# Patient Record
Sex: Female | Born: 1950 | Race: White | Hispanic: No | Marital: Married | Smoking: Never smoker
Health system: Southern US, Community
[De-identification: ages and names within clinical notes are randomized; demographics above are authoritative.]

## PROBLEM LIST (undated history)

## (undated) ENCOUNTER — Emergency Department (HOSPITAL_COMMUNITY): Admission: EM | Discharge status: Absent without leave | Payer: Medicare PPO | Source: Home / Self Care

## (undated) DIAGNOSIS — C50919 Malignant neoplasm of unspecified site of unspecified female breast: Secondary | ICD-10-CM

## (undated) DIAGNOSIS — I10 Essential (primary) hypertension: Secondary | ICD-10-CM

## (undated) DIAGNOSIS — T7840XA Allergy, unspecified, initial encounter: Secondary | ICD-10-CM

## (undated) DIAGNOSIS — R918 Other nonspecific abnormal finding of lung field: Secondary | ICD-10-CM

## (undated) DIAGNOSIS — M810 Age-related osteoporosis without current pathological fracture: Secondary | ICD-10-CM

## (undated) DIAGNOSIS — K649 Unspecified hemorrhoids: Secondary | ICD-10-CM

## (undated) DIAGNOSIS — E215 Disorder of parathyroid gland, unspecified: Secondary | ICD-10-CM

## (undated) DIAGNOSIS — R32 Unspecified urinary incontinence: Secondary | ICD-10-CM

## (undated) DIAGNOSIS — G459 Transient cerebral ischemic attack, unspecified: Secondary | ICD-10-CM

## (undated) DIAGNOSIS — R413 Other amnesia: Secondary | ICD-10-CM

## (undated) DIAGNOSIS — R269 Unspecified abnormalities of gait and mobility: Secondary | ICD-10-CM

## (undated) DIAGNOSIS — E785 Hyperlipidemia, unspecified: Secondary | ICD-10-CM

## (undated) DIAGNOSIS — H269 Unspecified cataract: Secondary | ICD-10-CM

## (undated) DIAGNOSIS — I519 Heart disease, unspecified: Secondary | ICD-10-CM

## (undated) DIAGNOSIS — E213 Hyperparathyroidism, unspecified: Secondary | ICD-10-CM

## (undated) DIAGNOSIS — K449 Diaphragmatic hernia without obstruction or gangrene: Secondary | ICD-10-CM

## (undated) DIAGNOSIS — F419 Anxiety disorder, unspecified: Secondary | ICD-10-CM

## (undated) HISTORY — PX: TUBAL LIGATION: SHX77

## (undated) HISTORY — DX: Unspecified cataract: H26.9

## (undated) HISTORY — PX: CHOLECYSTECTOMY: SHX55

## (undated) HISTORY — DX: Hyperlipidemia, unspecified: E78.5

## (undated) HISTORY — DX: Anxiety disorder, unspecified: F41.9

## (undated) HISTORY — DX: Disorder of parathyroid gland, unspecified: E21.5

## (undated) HISTORY — DX: Malignant neoplasm of unspecified site of unspecified female breast: C50.919

## (undated) HISTORY — PX: MASTECTOMY: SHX3

## (undated) HISTORY — DX: Allergy, unspecified, initial encounter: T78.40XA

## (undated) HISTORY — PX: PARATHYROIDECTOMY: SHX19

## (undated) HISTORY — DX: Transient cerebral ischemic attack, unspecified: G45.9

## (undated) HISTORY — DX: Unspecified abnormalities of gait and mobility: R26.9

## (undated) HISTORY — DX: Unspecified hemorrhoids: K64.9

## (undated) HISTORY — DX: Unspecified urinary incontinence: R32

## (undated) HISTORY — DX: Other amnesia: R41.3

## (undated) HISTORY — DX: Essential (primary) hypertension: I10

## (undated) HISTORY — PX: TOTAL ELBOW REPLACEMENT: SUR1214

## (undated) HISTORY — PX: BLADDER SUSPENSION: SHX72

## (undated) HISTORY — DX: Heart disease, unspecified: I51.9

## (undated) HISTORY — DX: Age-related osteoporosis without current pathological fracture: M81.0

## (undated) HISTORY — DX: Other nonspecific abnormal finding of lung field: R91.8

## (undated) HISTORY — DX: Hyperparathyroidism, unspecified: E21.3

## (undated) HISTORY — DX: Diaphragmatic hernia without obstruction or gangrene: K44.9

---

## 1998-10-07 ENCOUNTER — Other Ambulatory Visit: Admission: RE | Admit: 1998-10-07 | Discharge: 1998-10-07 | Payer: Self-pay | Admitting: *Deleted

## 1999-04-12 ENCOUNTER — Other Ambulatory Visit: Admission: RE | Admit: 1999-04-12 | Discharge: 1999-04-12 | Payer: Self-pay | Admitting: Surgery

## 1999-04-16 ENCOUNTER — Ambulatory Visit (HOSPITAL_BASED_OUTPATIENT_CLINIC_OR_DEPARTMENT_OTHER): Admission: RE | Admit: 1999-04-16 | Discharge: 1999-04-16 | Payer: Self-pay | Admitting: Surgery

## 1999-04-26 ENCOUNTER — Encounter: Payer: Self-pay | Admitting: Surgery

## 1999-04-27 ENCOUNTER — Ambulatory Visit (HOSPITAL_COMMUNITY): Admission: RE | Admit: 1999-04-27 | Discharge: 1999-04-29 | Payer: Self-pay | Admitting: Surgery

## 1999-04-27 ENCOUNTER — Encounter: Payer: Self-pay | Admitting: Surgery

## 2005-01-04 ENCOUNTER — Ambulatory Visit: Payer: Self-pay | Admitting: Oncology

## 2005-01-25 ENCOUNTER — Encounter: Admission: RE | Admit: 2005-01-25 | Discharge: 2005-01-25 | Payer: Self-pay | Admitting: Oncology

## 2005-01-28 ENCOUNTER — Other Ambulatory Visit: Admission: RE | Admit: 2005-01-28 | Discharge: 2005-01-28 | Payer: Self-pay | Admitting: Obstetrics and Gynecology

## 2005-02-08 ENCOUNTER — Encounter: Admission: RE | Admit: 2005-02-08 | Discharge: 2005-02-08 | Payer: Self-pay | Admitting: Oncology

## 2005-06-24 ENCOUNTER — Ambulatory Visit: Payer: Self-pay | Admitting: Oncology

## 2006-05-11 ENCOUNTER — Encounter: Admission: RE | Admit: 2006-05-11 | Discharge: 2006-05-11 | Payer: Self-pay | Admitting: Family Medicine

## 2006-07-28 ENCOUNTER — Ambulatory Visit (HOSPITAL_COMMUNITY): Admission: RE | Admit: 2006-07-28 | Discharge: 2006-07-28 | Payer: Self-pay | Admitting: Obstetrics and Gynecology

## 2006-08-05 ENCOUNTER — Emergency Department (HOSPITAL_COMMUNITY): Admission: EM | Admit: 2006-08-05 | Discharge: 2006-08-05 | Payer: Self-pay | Admitting: Emergency Medicine

## 2006-08-11 ENCOUNTER — Ambulatory Visit (HOSPITAL_BASED_OUTPATIENT_CLINIC_OR_DEPARTMENT_OTHER): Admission: RE | Admit: 2006-08-11 | Discharge: 2006-08-12 | Payer: Self-pay | Admitting: Orthopedic Surgery

## 2007-05-23 ENCOUNTER — Encounter: Admission: RE | Admit: 2007-05-23 | Discharge: 2007-05-23 | Payer: Self-pay | Admitting: Family Medicine

## 2007-12-20 ENCOUNTER — Emergency Department (HOSPITAL_COMMUNITY): Admission: EM | Admit: 2007-12-20 | Discharge: 2007-12-20 | Payer: Self-pay | Admitting: Emergency Medicine

## 2008-06-03 ENCOUNTER — Encounter: Admission: RE | Admit: 2008-06-03 | Discharge: 2008-06-03 | Payer: Self-pay | Admitting: Family Medicine

## 2008-12-26 ENCOUNTER — Ambulatory Visit (HOSPITAL_COMMUNITY): Admission: RE | Admit: 2008-12-26 | Discharge: 2008-12-26 | Payer: Self-pay | Admitting: Endocrinology

## 2009-01-10 ENCOUNTER — Encounter: Admission: RE | Admit: 2009-01-10 | Discharge: 2009-01-10 | Payer: Self-pay | Admitting: Surgery

## 2009-02-24 ENCOUNTER — Encounter (INDEPENDENT_AMBULATORY_CARE_PROVIDER_SITE_OTHER): Payer: Self-pay | Admitting: Surgery

## 2009-02-24 ENCOUNTER — Ambulatory Visit (HOSPITAL_COMMUNITY): Admission: RE | Admit: 2009-02-24 | Discharge: 2009-02-25 | Payer: Self-pay | Admitting: Surgery

## 2009-06-05 ENCOUNTER — Encounter: Admission: RE | Admit: 2009-06-05 | Discharge: 2009-06-05 | Payer: Self-pay | Admitting: Family Medicine

## 2010-10-05 ENCOUNTER — Encounter: Admission: RE | Admit: 2010-10-05 | Discharge: 2010-10-05 | Payer: Self-pay | Admitting: Obstetrics and Gynecology

## 2010-11-28 ENCOUNTER — Encounter: Payer: Self-pay | Admitting: Family Medicine

## 2011-02-01 ENCOUNTER — Other Ambulatory Visit: Payer: Self-pay | Admitting: Family Medicine

## 2011-02-01 DIAGNOSIS — R609 Edema, unspecified: Secondary | ICD-10-CM

## 2011-02-01 DIAGNOSIS — M25561 Pain in right knee: Secondary | ICD-10-CM

## 2011-02-02 ENCOUNTER — Ambulatory Visit
Admission: RE | Admit: 2011-02-02 | Discharge: 2011-02-02 | Disposition: A | Discharge status: Home / Self Care | Payer: BC Managed Care – PPO | Source: Ambulatory Visit | Attending: Family Medicine | Admitting: Family Medicine

## 2011-02-02 ENCOUNTER — Other Ambulatory Visit: Payer: Self-pay

## 2011-02-02 DIAGNOSIS — R609 Edema, unspecified: Secondary | ICD-10-CM

## 2011-02-02 DIAGNOSIS — M25561 Pain in right knee: Secondary | ICD-10-CM

## 2011-02-16 LAB — URINALYSIS, ROUTINE W REFLEX MICROSCOPIC
Glucose, UA: NEGATIVE mg/dL
Hgb urine dipstick: NEGATIVE
Ketones, ur: NEGATIVE mg/dL
Nitrite: NEGATIVE
Protein, ur: NEGATIVE mg/dL
Specific Gravity, Urine: 1.019 (ref 1.005–1.030)
pH: 6 (ref 5.0–8.0)

## 2011-02-16 LAB — CBC
MCHC: 34.9 g/dL (ref 30.0–36.0)
RBC: 4.18 MIL/uL (ref 3.87–5.11)
RDW: 12.6 % (ref 11.5–15.5)

## 2011-02-16 LAB — BASIC METABOLIC PANEL
BUN: 11 mg/dL (ref 6–23)
Calcium: 9.9 mg/dL (ref 8.4–10.5)
Chloride: 108 mEq/L (ref 96–112)
Creatinine, Ser: 0.77 mg/dL (ref 0.4–1.2)

## 2011-02-16 LAB — DIFFERENTIAL
Eosinophils Absolute: 0.2 10*3/uL (ref 0.0–0.7)
Lymphocytes Relative: 28 % (ref 12–46)
Lymphs Abs: 2 10*3/uL (ref 0.7–4.0)
Neutro Abs: 4.2 10*3/uL (ref 1.7–7.7)

## 2011-03-22 NOTE — Op Note (Signed)
NAMEALBIRTA, RHINEHART                ACCOUNT NO.:  [id number]   MEDICAL RECORD NO.:  [id number]          PATIENT TYPE:  OIB   LOCATION:  1340                         FACILITY:  [facility]   PHYSICIAN:  Velora Heckler, MD      DATE OF BIRTH:  [birth year]   DATE OF PROCEDURE:  02/24/2009  DATE OF DISCHARGE:                               OPERATIVE REPORT   PREOPERATIVE DIAGNOSIS:  Primary hyperparathyroidism.   POSTOPERATIVE DIAGNOSIS:  Primary hyperparathyroidism.   PROCEDURE:  Left inferior minimally invasive parathyroidectomy.   SURGEON:  Velora Heckler, MD, FACS   ANESTHESIA:  General, per Dr. Lucille Passy.   ESTIMATED BLOOD LOSS:  Minimal.   PREPARATION:  ChloraPrep.   COMPLICATIONS:  None.   INDICATIONS:  The patient is a 60 year old white female from [place],  [place].  She is referred by Dr. Dorisann Frames for suspected  primary hyperparathyroidism.  The patient has had a minimally elevated  PTH level of 74 with a calcium level of 10.4.  Sestamibi scan  demonstrated an area of increased uptake in the left inferior position.  MRI scan confirmed a 1 cm nodular density at that location.  The patient  now comes to surgery for exploration and resection.   BODY OF REPORT:  Procedure is done in OR #6 at the [facility].  The patient is brought to the operating room, placed in the  supine position on the operating room table.  Following administration  of general anesthesia, the patient is positioned and then prepped and  draped in the usual strict aseptic fashion.  After ascertaining that an  adequate level of anesthesia had been achieved, a left neck incision is  made with a #15 blade.  Dissection is carried down through subcutaneous  tissues.  Platysma is divided with the electrocautery.  Subplatysmal  flaps are developed circumferentially and a Weitlaner retractor is  placed for exposure.  Strap muscles are incised in the midline and  reflected  to the left, exposing the inferior pole of the left thyroid  lobe.  Venous tributaries are divided between small Ligaclips.  Palpation of the thyroid shows no significant nodularity.  There is no  mass.  On inspection, there is no gross abnormality.  Exploration  immediately posterior to the inferior pole and the tracheoesophageal  groove reveals a mass.  This is gently dissected out.  Vascular  tributaries are divided between Ligaclips.  This has the appearance of  an enlarged parathyroid gland.  It is excised in its entirety and  submitted to pathology.  It corresponds with the description of the  lesion seen on MRI scan.   Frozen section by Dr. Hollice Espy demonstrates parathyroid tissue as  well as thymic tissue.  The entire weight is 500 mg.   After review of the patient's diagnostic studies a decision is made to  conclude the procedure, given that the lesion in question appears to  have been resected.  Surgicel was placed in the operative field.  Strap  muscles were reapproximated in the midline with interrupted 3-0 Vicryl  sutures.  Platysma is closed with interrupted 3-0 Vicryl sutures.  Skin  is anesthetized with local Marcaine.  Skin is closed with a running 4-0  Monocryl subcuticular suture.  Wound is washed and dried and Benzoin and  Steri-Strips are applied, sterile dressings are applied.  The patient is  awakened from anesthesia and brought to the recovery room in stable  condition.  The patient tolerated the procedure well.      Velora Heckler, MD  Electronically Signed     TMG/MEDQ  D:  02/24/2009  T:  02/24/2009  Job:  [id number]   cc:   Dorisann Frames, M.D.  Fax: [phone]   Sigmund Hazel, M.D.  Fax: [phone]

## 2011-03-24 ENCOUNTER — Inpatient Hospital Stay (HOSPITAL_BASED_OUTPATIENT_CLINIC_OR_DEPARTMENT_OTHER)
Admission: RE | Admit: 2011-03-24 | Discharge: 2011-03-24 | Disposition: A | Discharge status: Home / Self Care | Payer: BC Managed Care – PPO | Source: Ambulatory Visit | Attending: Interventional Cardiology | Admitting: Interventional Cardiology

## 2011-03-24 DIAGNOSIS — R079 Chest pain, unspecified: Secondary | ICD-10-CM | POA: Insufficient documentation

## 2011-03-24 DIAGNOSIS — R0602 Shortness of breath: Secondary | ICD-10-CM | POA: Insufficient documentation

## 2011-03-25 NOTE — Op Note (Signed)
Sarah Cook, Sarah Cook                ACCOUNT NO.:  [id number]   MEDICAL RECORD NO.:  [id number]          PATIENT TYPE:  AMB   LOCATION:  DSC                          FACILITY:  [facility]   PHYSICIAN:  Cindee Salt, M.D.       DATE OF BIRTH:  03-Dec-1950   DATE OF PROCEDURE:  08/11/2006  DATE OF DISCHARGE:  08/11/2006                                 OPERATIVE REPORT   PREOPERATIVE DIAGNOSIS:  Comminuted fracture left radial head.   POSTOPERATIVE DIAGNOSIS:  Comminuted fracture left radial head.   OPERATION:  Open reduction, replacement of radial head with Acumed  prosthesis, left elbow.   SURGEON:  Cindee Salt, M.D.   ASSISTANT:  Artist Pais. Mina Marble, M.D.   ANESTHESIA:  General.   HISTORY:  The patient is a 60 year old female who suffered a fall and she  has a markedly comminuted fracture of her radial head with gross  displacement of fracture fragments.  She is admitted for reduction, possible  replacement, reconstruction of interosseous membrane, TFCC as dictated by  findings.  Preoperatively, the surgery was discussed with the patient and  husband.  Risks and complications of the surgery were fully detail including  injury to arteries, nerves, tendons, incomplete relief of symptoms,  dystrophy, arthritic changes in the capitellum, incomplete relief of  symptoms.  She is desirous of proceeding.   PROCEDURE:  The patient is brought to the operating room after marking the  preoperative area.  She is given a general anesthetic, prepped and draped  using DuraPrep, supine position, left arm free.  After 3-minute dry time,  she was draped.  A tourniquet placed high on the arm was inflated to 250  mmHg.  A longitudinal incision was made over the lateral aspect of the  elbow, carried down through subcutaneous tissue.  Bleeders were  electrocauterized.  Dissection carried down to the anconeus extensor  interval.  An incision made.  This was found be slightly posterior.  The  joint capsule was  opened.  The fractured radial head articular surface was  pointing in the wound.  A large fragment was removed.  A second large  fragment immediately extruded.  Hematoma was removed from the joint.  A  small portion of radial head was intact to the shaft.  This was removed with  a rongeur.  A third articular surface piece was then removed from the  central aspect.  Significant crepitation was experienced with pronation and  supination with clicking.  The joint was further explored.  The anterior  capsule was then elevated and a fifth large fragment of articular cartilage  and head was then removed from the anterior aspect of the joint.  No further  lesions were identified.  X-rays confirmed that there were no further pieces  loose within the joint.  The joint was then copiously irrigated with saline.  A pull-down test was then performed.  There was no proximal translation of  the radius compared to the ulna.  It was decided to proceed with radial head  implantation.  The radial neck was then rongeured smooth.  The  broach was  then introduced.  This was then broached to a 7 mm stem size.  The rasp was  then introduced and the radial neck was rasped smooth and congruous.  The  radial head guide was then introduced and the height was then measured to be  a size 2.  The radial head was then reconstructed in the jig.  This was  found be 24 mm.  A left 24 mm head was then attached to a 7 mm stem with a 2  increase.  This was then introduced with the alignment of the stem guide in  the mid lateral position with the wrist in neutral.  With a moderate amount  of difficulty the radial head prosthesis was then inserted.  This was quite  stable.  AP and lateral x-rays revealed it in excellent position.  There was  no crepitation.  Care was taken not to overstuff it.  There was full  pronation, full supination, full flexion/extension of the elbow.  The joint  was then copiously irrigated.  A significant  avulsion of the lateral  epicondylar structures had occurred with the injury and it was decided to  reconstruct and repair of these with an anchor.  A 3.5 Statak anchor was  then inserted into the lateral epicondyle and this allowed closure of the  entire lateral epicondyle extensor origin in any portion of the lateral  collateral ligament.  There was no gross posterolateral rotatory instability  on testing.  The wound was again irrigated.  The fascia was then closed with  a running 2-0 Vicryl suture, the subcutaneous tissue with a 2-0 Vicryl  suture and the skin with subcuticular 3-0 Monocryl suture.  Steri-Strips  were applied.  Sterile compressive dressing with a long-arm splint applied.  The patient tolerated the procedure well and was taken to the recovery  observation in satisfactory condition.  She is admitted for overnight stay  for pain control.  She will be discharged on Percocet.           ______________________________  Cindee Salt, M.D.     GK/MEDQ  D:  08/11/2006  T:  08/13/2006  Job:  [id number]

## 2011-03-25 NOTE — H&P (Signed)
Sarah Cook, ROMAGNOLI NO.:  [id number]   MEDICAL RECORD NO.:  [id number]          PATIENT TYPE:  AMB   LOCATION:  SDC                           FACILITY:  [facility]   PHYSICIAN:  Juluis Mire, M.D.   DATE OF BIRTH:  [birth year]   DATE OF ADMISSION:  07/28/2006  DATE OF DISCHARGE:  07/28/2006                                HISTORY & PHYSICAL   This is a 60 year old gravida 3, para 3, postmenopausal patient troubled  with stress incontinence, now presents for a mid urethral sling.   In relation to the present admission, the patient has had trouble with  worsening stress incontinence.  It has become limited at times, she has  leaks with events such as coughing, sneezing and straining.  She underwent  complete urodynamic testing in the office.  She had a normal voiding  pattern, there was no evidence of obstructive uropathy.  She had normal post  void residual.  During cystometrics she had normal volumes.  She did leak  during events such as Valsalva and coughing.  She had normal leak point  pressures.  Her urethral pressure profile was also normal.  She had normal  cystometric volumes.  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  She now presents  for a mid urethral sling.  We are going to use an obturator approach.   In terms of allergies, NO KNOWN DRUG ALLERGIES.   MEDICATIONS:  She is on Diovan and hydrochlorothiazide and she uses  Prilosec.   PAST MEDICAL HISTORY:  Is significant in that:  1. She is being actively managed for hypertension.  2. Does have history of breast cancer.  She underwent a previous right      mastectomy for breast carcinoma in 2000.  She is followed by Dr.      Darrold Span, Hematology/Oncology.  There has been no evidence of      recurrence.  3. She has also had a previous laparoscopic bilateral tubal ligation.  4. She has previous laparoscopic cholecystectomy.  5. She has had  previous rhinoplasty by Dr. Charolotte Eke in 1979.  6. She has had previous oral surgery.   In terms of obstetrical history, she has had 3 prior cesarean sections.   FAMILY HISTORY:  Noncontributory.   SOCIAL HISTORY:  Reveals no tobacco or alcohol use.   REVIEW OF SYSTEMS:  Noncontributory.   PHYSICAL EXAM:  The patient is afebrile with stable vital signs.  HEENT EXAM:  Face normocephalic.  Pupils equal and reactive, react to light  and accommodation.  Extraocular movements were intact.  Sclera, conjunctiva  clear.  Oropharynx clear.  NECK:  Without thyromegaly.  BREASTS:  Right breast is surgically absent, incision line intact, no  nodularity.  Left breast __________ nipple changes, no dominant mass,  adenopathy or nipple discharge.  LUNGS:  Clear.  CARDIAC SYSTEM:  Regular rate without murmurs or gallops.  ABDOMINAL EXAM:  Benign.  No masses, organomegaly or tenderness.  PELVIC:  Normal external genitalia.  VAGINAL MUCOSA:  Mild cystourethrocele.  CERVIX:  Unremarkable.  UTERUS:  Normal size, shape and contrast.  ADNEXA:  Free of masses or tenderness.  RECTOVAGINAL EXAM:  Clear.  EXTREMITIES:  Trace edema.  NEUROLOGIC EXAM:  Grossly normal limits.   IMPRESSION:  1. 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  2. Hypertension, under active management.  3. Previous right mastectomy for breast carcinoma.   PLAN:  The patient will undergo a transobturator approach to mid urethral  sling, success rates of 80% are quoted.  Potential risk of increasing  bladder spasms have been discussed.  If the sling is over-tightened, there  may the inability to void.  This could require removing the sling after long  term catheterization.  Return of incontinence is a possibility.  There is  the risk of mesh erosion into the bladder, urethra or vagina that could  require removal of the sling also.  There is the risk, in view of the  obturator approach, of injury to  the obturator nerve that could lead to leg  weakness or chronic pain.  There is the risk of vascular injury that could  require transfusion or further exploratory surgery.  There is the risk of  injury to bladder, bowel, urethra or ureters that could require further  exploratory surgery.  There is the risk of deep venous thrombosis and  pulmonary embolus.  Also, anesthetic complications are discussed.  The  patient stressed understanding of indications and risks.      Juluis Mire, M.D.  Electronically Signed     JSM/MEDQ  D:  07/28/2006  T:  07/30/2006  Job:  [id number]

## 2011-03-25 NOTE — Op Note (Signed)
Sarah Cook, Sarah Cook NO.:  [id number]   MEDICAL RECORD NO.:  [id number]          PATIENT TYPE:  AMB   LOCATION:  SDC                           FACILITY:  [facility]   PHYSICIAN:  Juluis Mire, M.D.   DATE OF BIRTH:  [birth year]   DATE OF PROCEDURE:  07/28/2006  DATE OF DISCHARGE:  07/28/2006                                 OPERATIVE REPORT   PREOPERATIVE DIAGNOSIS:  Genuine stress incontinence with urethral  hypermobility.   POSTOPERATIVE DIAGNOSIS:  Genuine stress incontinence with urethral  hypermobility.   OPERATIVE PROCEDURE:  Mid urethral sling using a trans-obturator approach.   SURGEON:  Juluis Mire, M.D.   ANESTHESIA:  General.   ESTIMATED BLOOD LOSS:  Minimal.   PACKS AND DRAINS:  None.   BLOOD REPLACED:  None.   COMPLICATIONS:  None.   INDICATIONS FOR PROCEDURE:  See dictated history and physical.   PROCEDURE IN DETAIL:  The patient was taken to the OR and placed in the  supine position.  After a satisfactory level of general endotracheal  anesthesia was obtained, the patient was placed in the dorsal lithotomy  position using the Allen stirrups.  The perineum and vagina were prepped out  with Betadine and draped in a sterile field.  A Foley catheter was placed to  straight drain.  The midurethral area was identified and injected with a  dilute solution of Xylocaine with epinephrine.  A midline vaginal incision  was made with the knife and dissected out laterally.  We then bluntly  dissected out to the obturator foramen area.  We had good hemostasis and  clear dissection.  We identified the area for the skin incision on the  vulvar area.  This was at the level of the clitoris but below the abductus  longus muscle.  This area was injected with Xylocaine with epinephrine.  A  small incision was made.  Using the needle guides, we guided each needle  first through the obturator muscle and fascia then around the ramus and out  to the vaginal  incision.  The same procedure was followed on both sides and  was done easily.  Cystoscopy was then performed.  There was no evidence of  intrusion into the bladder or urethra.  Both ureteral orifices were  identified and noted to be spilling urine.  At this point in time, a  polypropylene mesh was hooked to the metal probes and brought out.  It was  adjusted under the mid urethral area.  We could easily get a Kelly rotated  90 degrees.  The mesh was still visible.  It was lying flat.  It felt like  it had been appropriately tightened at this point in time.  We trimmed the  arms of the mesh at the level of the skin and closed the skin with  Dermabond, the vaginal mucosa with  a running suture of 3-0 Vicryl.  The Foley catheter was placed back to  straight drain.  At this point in time, sponge, instrument, and needle  counts were reported as correct by the circulating nurse  x2.  The patient  was taken out of the dorsal lithotomy position, once alert and extubated,  was transferred to the recovery room in good condition.      Juluis Mire, M.D.  Electronically Signed     JSM/MEDQ  D:  07/28/2006  T:  07/30/2006  Job:  [id number]

## 2011-03-30 NOTE — Cardiovascular Report (Signed)
Sarah, Cook NO.:  [id number]  MEDICAL RECORD NO.:  [id number]           PATIENT TYPE:  LOCATION:                                 FACILITY:  PHYSICIAN:  Corky Crafts, MD     DATE OF BIRTH:  DATE OF PROCEDURE:  03/24/2011 DATE OF DISCHARGE:                           CARDIAC CATHETERIZATION   PRIMARY CARE PHYSICIAN:  Sigmund Hazel, MD  PROCEDURE PERFORMED:  Left heart catheterization, left ventriculogram, coronary angiogram, and abdominal aortogram.  OPERATOR:  Corky Crafts, MD  INDICATIONS:  Persistent shortness of breath, chest pain with exercise.  PROCEDURE NARRATIVE:  The risks and benefits of cardiac catheterization were explained to the patient and informed consent was obtained.  She was brought to the Cath Lab.  She was prepped and draped in the usual sterile fashion.  Her right groin was infiltrated with 1% lidocaine.  A 4-French sheath was placed into the right common femoral artery using the modified Seldinger technique.  Left coronary artery angiography was performed using a JL-4 pigtail catheter.  The catheter was advanced to the vessel ostium under fluoroscopic guidance.  Digital angiography was performed in multiple projections using hand injection of contrast. Right coronary artery angiography was performed using a JR-4 pigtail catheter in a similar fashion.  Pigtail catheter was advanced to the ascending aorta and across the aortic valve under fluoroscopic guidance. A power injection of contrast was performed in the RAO projection to image the left ventricle.  The catheter was pulled back under continuous hemodynamic pressure monitoring.  The catheter was advanced to the abdominal aorta and a power injection of contrast was performed in the AP projection to image the infrarenal aorta.  The sheath was pulled using manual compression.  FINDINGS:  The left main was widely patent. Left circumflex is large vessel.  There is  an OM-1 and OM-2.  The OM-1 is large and OM-2 is medium.  The entire circumflex system was angiographically normal. Left anterior descending was a large vessel, which reached the apex. There were two small diagonals.  The entire LAD system appeared angiographically normal. The right coronary artery is a large dominant vessel.  The posterolateral artery and PDA were medium-sized vessel.  The entire right coronary artery system is angiographically normal. Left ventriculogram showed an ejection fraction of 60%.  HEMODYNAMIC:  Left ventricular pressure 125/15 with an LVEDP of 19 mmHg. Aortic pressure 127/74 with a mean aortic pressure of 99 mmHg. The abdominal aortogram showed no abdominal aortic aneurysm.  There were bilateral single renal arteries, both of which were widely patent.  The aortoiliac bifurcation also appears widely patent.  IMPRESSION: 1. No significant coronary artery disease. 2. Normal left ventricular function. 3. No aortic valve gradient. 4. No abdominal aortic aneurysm. 5. No renal artery stenosis.RECOMMENDATIONS:  Continue preventive therapy.  She will try to lose weight and resume an exercise program.  I do not believe her chest pain is coming from her heart, other noncardiac etiologies should be investigated.     Corky Crafts, MD     JSV/MEDQ  D:  03/24/2011  T:  03/24/2011  Job:  [id number]  Electronically Signed by Lance Muss MD on 03/30/2011 05:09:50 PM

## 2011-09-01 ENCOUNTER — Other Ambulatory Visit (HOSPITAL_COMMUNITY): Payer: Self-pay | Admitting: Obstetrics and Gynecology

## 2011-09-01 DIAGNOSIS — Z1231 Encounter for screening mammogram for malignant neoplasm of breast: Secondary | ICD-10-CM

## 2011-10-11 ENCOUNTER — Encounter: Payer: Self-pay | Admitting: Internal Medicine

## 2011-10-14 ENCOUNTER — Ambulatory Visit (HOSPITAL_COMMUNITY): Payer: BC Managed Care – PPO

## 2011-11-14 ENCOUNTER — Ambulatory Visit (HOSPITAL_COMMUNITY)
Admission: RE | Admit: 2011-11-14 | Discharge: 2011-11-14 | Disposition: A | Discharge status: Home / Self Care | Payer: BC Managed Care – PPO | Source: Ambulatory Visit | Attending: Obstetrics and Gynecology | Admitting: Obstetrics and Gynecology

## 2011-11-14 DIAGNOSIS — Z1231 Encounter for screening mammogram for malignant neoplasm of breast: Secondary | ICD-10-CM | POA: Insufficient documentation

## 2011-11-17 ENCOUNTER — Other Ambulatory Visit (HOSPITAL_COMMUNITY): Payer: Self-pay | Admitting: Family Medicine

## 2011-11-17 DIAGNOSIS — R0602 Shortness of breath: Secondary | ICD-10-CM

## 2011-11-21 ENCOUNTER — Ambulatory Visit (HOSPITAL_COMMUNITY)
Admission: RE | Admit: 2011-11-21 | Discharge: 2011-11-21 | Disposition: A | Discharge status: Home / Self Care | Payer: BC Managed Care – PPO | Source: Ambulatory Visit | Attending: Family Medicine | Admitting: Family Medicine

## 2011-11-21 DIAGNOSIS — R059 Cough, unspecified: Secondary | ICD-10-CM | POA: Insufficient documentation

## 2011-11-21 DIAGNOSIS — R0609 Other forms of dyspnea: Secondary | ICD-10-CM | POA: Insufficient documentation

## 2011-11-21 DIAGNOSIS — R062 Wheezing: Secondary | ICD-10-CM | POA: Insufficient documentation

## 2011-11-21 DIAGNOSIS — R0989 Other specified symptoms and signs involving the circulatory and respiratory systems: Secondary | ICD-10-CM | POA: Insufficient documentation

## 2011-11-21 DIAGNOSIS — E669 Obesity, unspecified: Secondary | ICD-10-CM | POA: Insufficient documentation

## 2011-11-21 DIAGNOSIS — R0602 Shortness of breath: Secondary | ICD-10-CM | POA: Insufficient documentation

## 2011-11-21 DIAGNOSIS — R05 Cough: Secondary | ICD-10-CM | POA: Insufficient documentation

## 2011-11-21 MED ORDER — LEVALBUTEROL HCL 0.63 MG/3ML IN NEBU
0.6300 mg | INHALATION_SOLUTION | Freq: Once | RESPIRATORY_TRACT | Status: AC
  Administered 2011-11-21: 0.63 mg via RESPIRATORY_TRACT
  Filled 2011-11-21: qty 3

## 2012-02-29 ENCOUNTER — Other Ambulatory Visit: Payer: Self-pay | Admitting: Family Medicine

## 2012-02-29 DIAGNOSIS — R51 Headache: Secondary | ICD-10-CM

## 2012-03-07 ENCOUNTER — Ambulatory Visit
Admission: RE | Admit: 2012-03-07 | Discharge: 2012-03-07 | Disposition: A | Discharge status: Home / Self Care | Payer: BC Managed Care – PPO | Source: Ambulatory Visit | Attending: Family Medicine | Admitting: Family Medicine

## 2012-03-07 DIAGNOSIS — R51 Headache: Secondary | ICD-10-CM

## 2012-03-07 MED ORDER — GADOBENATE DIMEGLUMINE 529 MG/ML IV SOLN
19.0000 mL | Freq: Once | INTRAVENOUS | Status: AC | PRN
  Administered 2012-03-07: 19 mL via INTRAVENOUS

## 2012-06-26 ENCOUNTER — Other Ambulatory Visit: Payer: Self-pay | Admitting: Internal Medicine

## 2012-06-26 DIAGNOSIS — E210 Primary hyperparathyroidism: Secondary | ICD-10-CM

## 2012-07-05 ENCOUNTER — Ambulatory Visit (HOSPITAL_COMMUNITY)
Admission: RE | Admit: 2012-07-05 | Discharge: 2012-07-05 | Disposition: A | Discharge status: Home / Self Care | Payer: BC Managed Care – PPO | Source: Ambulatory Visit | Attending: Internal Medicine | Admitting: Internal Medicine

## 2012-07-05 ENCOUNTER — Encounter (HOSPITAL_COMMUNITY)
Admission: RE | Admit: 2012-07-05 | Discharge: 2012-07-05 | Disposition: A | Discharge status: Home / Self Care | Payer: BC Managed Care – PPO | Source: Ambulatory Visit | Attending: Internal Medicine | Admitting: Internal Medicine

## 2012-07-05 DIAGNOSIS — E213 Hyperparathyroidism, unspecified: Secondary | ICD-10-CM | POA: Insufficient documentation

## 2012-07-05 DIAGNOSIS — E210 Primary hyperparathyroidism: Secondary | ICD-10-CM

## 2012-07-05 MED ORDER — TECHNETIUM TC 99M SESTAMIBI - CARDIOLITE
25.0000 | Freq: Once | INTRAVENOUS | Status: AC | PRN
  Administered 2012-07-05: 10:00:00 27 via INTRAVENOUS

## 2012-08-20 ENCOUNTER — Encounter (HOSPITAL_COMMUNITY): Payer: Self-pay | Admitting: *Deleted

## 2012-08-20 ENCOUNTER — Emergency Department (HOSPITAL_COMMUNITY): Payer: BC Managed Care – PPO

## 2012-08-20 ENCOUNTER — Emergency Department (HOSPITAL_COMMUNITY)
Admission: EM | Admit: 2012-08-20 | Discharge: 2012-08-20 | Disposition: A | Discharge status: Home / Self Care | Payer: BC Managed Care – PPO | Attending: Emergency Medicine | Admitting: Emergency Medicine

## 2012-08-20 DIAGNOSIS — F411 Generalized anxiety disorder: Secondary | ICD-10-CM | POA: Insufficient documentation

## 2012-08-20 DIAGNOSIS — R51 Headache: Secondary | ICD-10-CM | POA: Insufficient documentation

## 2012-08-20 DIAGNOSIS — F419 Anxiety disorder, unspecified: Secondary | ICD-10-CM

## 2012-08-20 DIAGNOSIS — R0602 Shortness of breath: Secondary | ICD-10-CM | POA: Insufficient documentation

## 2012-08-20 DIAGNOSIS — Z853 Personal history of malignant neoplasm of breast: Secondary | ICD-10-CM | POA: Insufficient documentation

## 2012-08-20 DIAGNOSIS — I2510 Atherosclerotic heart disease of native coronary artery without angina pectoris: Secondary | ICD-10-CM | POA: Insufficient documentation

## 2012-08-20 DIAGNOSIS — R079 Chest pain, unspecified: Secondary | ICD-10-CM | POA: Insufficient documentation

## 2012-08-20 LAB — COMPREHENSIVE METABOLIC PANEL
Albumin: 3.9 g/dL (ref 3.5–5.2)
Alkaline Phosphatase: 86 U/L (ref 39–117)
BUN: 13 mg/dL (ref 6–23)
Potassium: 4.2 mEq/L (ref 3.5–5.1)
Sodium: 137 mEq/L (ref 135–145)
Total Protein: 6.9 g/dL (ref 6.0–8.3)

## 2012-08-20 LAB — CBC WITH DIFFERENTIAL/PLATELET
Basophils Relative: 1 % (ref 0–1)
Eosinophils Absolute: 0.1 10*3/uL (ref 0.0–0.7)
MCH: 30.8 pg (ref 26.0–34.0)
MCHC: 35.2 g/dL (ref 30.0–36.0)
Monocytes Relative: 9 % (ref 3–12)
Neutrophils Relative %: 70 % (ref 43–77)
Platelets: 305 10*3/uL (ref 150–400)

## 2012-08-20 LAB — TROPONIN I
Troponin I: <0.3 ng/mL (ref <0.30)
Troponin I: <0.3 ng/mL (ref <0.30)

## 2012-08-20 MED ORDER — IOHEXOL 300 MG/ML  SOLN
80.0000 mL | Freq: Once | INTRAMUSCULAR | Status: AC | PRN
  Administered 2012-08-20: 80 mL via INTRAVENOUS

## 2012-08-20 MED ORDER — MORPHINE SULFATE 4 MG/ML IJ SOLN
4.0000 mg | Freq: Once | INTRAMUSCULAR | Status: DC
  Filled 2012-08-20: qty 1

## 2012-08-20 MED ORDER — ONDANSETRON HCL 4 MG/2ML IJ SOLN
INTRAMUSCULAR | Status: AC
  Administered 2012-08-20: 4 mg via INTRAVENOUS
  Filled 2012-08-20: qty 2

## 2012-08-20 MED ORDER — ONDANSETRON HCL 4 MG/2ML IJ SOLN
4.0000 mg | Freq: Once | INTRAMUSCULAR | Status: AC
  Administered 2012-08-20: 4 mg via INTRAVENOUS

## 2012-08-20 MED ORDER — LORAZEPAM 1 MG PO TABS
1.0000 mg | ORAL_TABLET | Freq: Once | ORAL | Status: AC
  Administered 2012-08-20: 1 mg via ORAL
  Filled 2012-08-20: qty 1

## 2012-08-20 MED ORDER — MORPHINE SULFATE 4 MG/ML IJ SOLN
4.0000 mg | Freq: Once | INTRAMUSCULAR | Status: AC
  Administered 2012-08-20: 4 mg via INTRAVENOUS
  Filled 2012-08-20: qty 1

## 2012-08-20 NOTE — ED Notes (Signed)
To ED from work via EMS, reports central, non-radiating CP onset 30 min pta, took 325 ASA pta, EMS gave 1 nitro pta with no relief, VSS, 22g left hand, NAD

## 2012-08-20 NOTE — ED Notes (Signed)
CT notified that patient is ready for exam.

## 2012-08-20 NOTE — ED Provider Notes (Signed)
History     CSN: [id number]  Arrival date & time 08/20/12  1504   First MD Initiated Contact with Patient 08/20/12 1515      Chief Complaint  Patient presents with  . Chest Pain    (Consider location/radiation/quality/duration/timing/severity/associated sxs/prior treatment) HPI Comments: Patient reports central chest pain that began around 9:30 this morning.  Pain has been intermittent, initially involved only a small circle but has been expanding since it began, described as gnawing and pressure, worse with walking, talking, and deep inspiration. Pain does not radiate.  Associated SOB.  States she has not been feeling well since last night, described as being "very tired."  Denies cough, lightheadedness, nausea, abdominal pain.  Pt does note that she is very anxious today because she learned she is going to have put her cat down.  Pt has a hx HTN, hyperlipidemia, remote hx breast cancer (13 yrs ago, treated with mastectomy and chemotherapy).  Pt has cardiac cath in 03/2011 by Dr Eldridge Dace showing no significant CAD.  Family hx father with CHF, died at 52.    The history is provided by the patient.    No past medical history on file.  No past surgical history on file.  No family history on file.  History  Substance Use Topics  . Smoking status: Not on file  . Smokeless tobacco: Not on file  . Alcohol Use: Not on file    OB History    Grav Para Term Preterm Abortions TAB SAB Ect Mult Living                  Review of Systems  Respiratory: Positive for shortness of breath. Negative for cough.   Cardiovascular: Positive for chest pain.  Gastrointestinal: Negative for nausea, vomiting, abdominal pain and diarrhea.  Musculoskeletal: Positive for back pain.  Neurological: Positive for headaches. Negative for light-headedness.    Allergies  Review of patient's allergies indicates not on file.  Home Medications  No current outpatient prescriptions on file.  BP 133/56   Pulse 67  Temp 98 F (36.7 C) (Oral)  Resp 16  SpO2 96%  Physical Exam  Nursing note and vitals reviewed. Constitutional: She appears well-developed and well-nourished. No distress.  HENT:  Head: Normocephalic and atraumatic.  Neck: Neck supple.  Cardiovascular: Normal rate and regular rhythm.   Pulmonary/Chest: Effort normal and breath sounds normal. No respiratory distress. She has no wheezes. She has no rales. She exhibits tenderness.         Chest wall tender where the pain is located, but does not reproduce pain per patient.   Abdominal: Soft. She exhibits no distension. There is no tenderness. There is no rebound and no guarding.  Neurological: She is alert.  Skin: She is not diaphoretic.    ED Course  Procedures (including critical care time)  Labs Reviewed  COMPREHENSIVE METABOLIC PANEL - Abnormal; Notable for the following:    Glucose, Bld 103 (*)     Calcium 10.6 (*)     GFR calc non Af Amer 74 (*)     GFR calc Af Amer 86 (*)     All other components within normal limits  D-DIMER, QUANTITATIVE - Abnormal; Notable for the following:    D-Dimer, Quant 1.43 (*)     All other components within normal limits  CBC WITH DIFFERENTIAL  TROPONIN I  TROPONIN I   Dg Chest 2 View  08/20/2012  *RADIOLOGY REPORT*  Clinical Data: Chest pain.  CHEST -  2 VIEW  Comparison: November 17, 2011.  Findings: Cardiomediastinal silhouette appears normal.  Hiatal hernia is unchanged.  No acute pulmonary disease is noted.  Bony thorax appears intact.  IMPRESSION: No acute cardiopulmonary abnormality seen.   Original Report Authenticated By: Venita Sheffield., M.D.    Ct Angio Chest W/cm &/or Wo Cm  08/20/2012  *RADIOLOGY REPORT*  Clinical Data: Chest pain.  History breast cancer and right mastectomy.  CT ANGIOGRAPHY CHEST  Technique:  Multidetector CT imaging of the chest using the standard protocol during bolus administration of intravenous contrast. Multiplanar reconstructed images  including MIPs were obtained and reviewed to evaluate the vascular anatomy.  Contrast: 80mL OMNIPAQUE IOHEXOL 300 MG/ML  SOLN  Comparison: Plain film of 08/20/2012.  No prior CT.  Findings: Lung windows demonstrate mild motion degradation throughout.  A perifissural nodule on the right major fissure measures 4 mm on image 38 of series 7. A 5 mm nodule positioned along the right minor fissure within the right middle lobe on image 41.  Soft tissue windows:  The quality of this exam for evaluation of pulmonary embolism is moderate to good.  The primary limitation is the above described motion artifact. No evidence of pulmonary embolism.  No supraclavicular adenopathy.  Surgical clip in the right axilla. Right mastectomy. No axillary adenopathy.  Normal thoracic aortic caliber.  Suboptimal opacification secondary bolus timing degrades evaluation for aortic dissection.  Mild cardiomegaly.  Calcified LAD coronary artery atherosclerosis.  No mediastinal or hilar adenopathy.  Moderate hiatal hernia.  Limited abdominal imaging demonstrates cholecystectomy. No acute osseous abnormality.  IMPRESSION:  1.  Mild motion degradation.  Given this factor, no evidence of acute pulmonary embolism. 2.  Right-sided mastectomy.  2 right lung base nodules measuring up to 5 mm.  Possibly subpleural lymph nodes.  Recommend follow-up chest CT approximately 6 months to confirm stability or resolution. 3.  Moderate hiatal hernia. 4.  Cardiomegaly. 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 Recommend assessment of coronary risk factors and consideration of medical therapy.   Original Report Authenticated By: Consuello Bossier, M.D.      Date: 08/20/2012  Rate: 71  Rhythm: normal sinus rhythm  QRS Axis: normal  Intervals: normal  ST/T Wave abnormalities: nonspecific ST changes  Conduction Disutrbances:none  Narrative Interpretation:   Old EKG Reviewed: unchanged  3:47 PM Discussed patient and plan with Dr Effie Shy who has also seen the  patient.  Pt had negative cath in 2012, will do one troponin, d-dimer, CXR, basic labs.  Will give ativan given high level of anxiety.  If all negative, anticipate d/c home with cardiology follow up Eldridge Dace).    6:51 PM Discussed patient again with Dr Effie Shy.  Will order repeat troponin.  If negative, pt may be d/c home.   1. Chest pain   2. Anxiety     MDM  Pt with central chest pain and shortness of breath today while at rest.  Pain is worse with talking, inspiration, and with exertion.  Patient had reassuring cardiac cath in 03/2011.  Pain is very atypical.  Doubt ACS.  CT angio negative for PE.  EKG without ischemic changes, negative troponin x 2.  Pt with increased anxiety today as she is about to put her cat down.  Also major stress at work.  Pt also seen by Dr Effie Shy who agrees patient is low risk for ACS.  Pt d/c home with cardiology follow up.  Discussed all results with patient.  Pt given return precautions.  Pt verbalizes understanding and agrees with plan.           [place], [place] 08/20/12 2042

## 2012-08-20 NOTE — ED Notes (Signed)
IV team paged to establish larger IV access for CT chest.

## 2012-08-20 NOTE — ED Provider Notes (Signed)
DOROTHEY OETKEN is a 61 y.o. female who presents for evaluation of waxing and waning chest pain in the setting of anxiety and stress.. she was treated by EMS, with aspirin, and a single nitroglycerin without change in the discomfort. The pain started at 9:30 this morning. The pain worsens when she talks about the pain. She has elevated lipids, but no family predisposition toward coronary artery disease. She does not smoke. She has a low-level cardiac risk profile. On exam, she is anxious. She is cooperative. She is not in respiratory distress.  Plan evaluate in the ED for signs of cardiac illness. She had recent cardiac catheterization in 2012 that is reassuring for low cardiac risk.   Medical screening examination/treatment/procedure(s) were conducted as a shared visit with non-physician practitioner(s) and myself.  I personally evaluated the patient during the encounter  Flint Melter, MD 08/21/12 [phone]

## 2012-08-21 NOTE — ED Provider Notes (Signed)
Medical screening examination/treatment/procedure(s) were conducted as a shared visit with non-physician practitioner(s) and myself.  I personally evaluated the patient during the encounter  Flint Melter, MD 08/21/12 [phone]

## 2012-10-22 ENCOUNTER — Other Ambulatory Visit (HOSPITAL_COMMUNITY): Payer: Self-pay | Admitting: Obstetrics and Gynecology

## 2012-10-22 DIAGNOSIS — Z1231 Encounter for screening mammogram for malignant neoplasm of breast: Secondary | ICD-10-CM

## 2012-11-07 DIAGNOSIS — K449 Diaphragmatic hernia without obstruction or gangrene: Secondary | ICD-10-CM

## 2012-11-07 HISTORY — DX: Diaphragmatic hernia without obstruction or gangrene: K44.9

## 2012-11-15 ENCOUNTER — Ambulatory Visit (HOSPITAL_COMMUNITY)
Admission: RE | Admit: 2012-11-15 | Discharge: 2012-11-15 | Disposition: A | Discharge status: Home / Self Care | Payer: BC Managed Care – PPO | Source: Ambulatory Visit | Attending: Obstetrics and Gynecology | Admitting: Obstetrics and Gynecology

## 2012-11-15 ENCOUNTER — Other Ambulatory Visit (HOSPITAL_COMMUNITY): Payer: Self-pay | Admitting: Obstetrics and Gynecology

## 2012-11-15 DIAGNOSIS — Z1231 Encounter for screening mammogram for malignant neoplasm of breast: Secondary | ICD-10-CM | POA: Insufficient documentation

## 2012-12-22 ENCOUNTER — Other Ambulatory Visit: Payer: Self-pay

## 2013-01-10 ENCOUNTER — Other Ambulatory Visit: Payer: Self-pay | Admitting: Family Medicine

## 2013-01-29 ENCOUNTER — Ambulatory Visit
Admission: RE | Admit: 2013-01-29 | Discharge: 2013-01-29 | Disposition: A | Discharge status: Home / Self Care | Payer: BC Managed Care – PPO | Source: Ambulatory Visit | Attending: Family Medicine | Admitting: Family Medicine

## 2013-01-29 DIAGNOSIS — I517 Cardiomegaly: Secondary | ICD-10-CM

## 2013-01-29 MED ORDER — IOHEXOL 300 MG/ML  SOLN
75.0000 mL | Freq: Once | INTRAMUSCULAR | Status: AC | PRN
  Administered 2013-01-29: 75 mL via INTRAVENOUS

## 2013-09-12 ENCOUNTER — Other Ambulatory Visit: Payer: Self-pay

## 2013-10-08 ENCOUNTER — Other Ambulatory Visit (HOSPITAL_COMMUNITY): Payer: Self-pay | Admitting: Obstetrics and Gynecology

## 2013-10-08 ENCOUNTER — Other Ambulatory Visit (HOSPITAL_COMMUNITY): Payer: Self-pay | Admitting: Family Medicine

## 2013-10-08 DIAGNOSIS — Z1231 Encounter for screening mammogram for malignant neoplasm of breast: Secondary | ICD-10-CM

## 2013-10-14 ENCOUNTER — Encounter: Payer: Self-pay | Admitting: *Deleted

## 2013-10-24 ENCOUNTER — Encounter: Payer: Self-pay | Admitting: Internal Medicine

## 2013-11-19 ENCOUNTER — Other Ambulatory Visit (HOSPITAL_COMMUNITY): Payer: Self-pay | Admitting: Family Medicine

## 2013-11-19 ENCOUNTER — Ambulatory Visit (HOSPITAL_COMMUNITY)
Admission: RE | Admit: 2013-11-19 | Discharge: 2013-11-19 | Disposition: A | Discharge status: Home / Self Care | Payer: BC Managed Care – PPO | Source: Ambulatory Visit | Attending: Family Medicine | Admitting: Family Medicine

## 2013-11-19 DIAGNOSIS — Z1231 Encounter for screening mammogram for malignant neoplasm of breast: Secondary | ICD-10-CM

## 2013-11-21 ENCOUNTER — Other Ambulatory Visit: Payer: Self-pay | Admitting: Obstetrics and Gynecology

## 2013-11-21 DIAGNOSIS — Z9011 Acquired absence of right breast and nipple: Secondary | ICD-10-CM

## 2013-11-21 DIAGNOSIS — N644 Mastodynia: Secondary | ICD-10-CM

## 2013-11-26 ENCOUNTER — Emergency Department (HOSPITAL_COMMUNITY)
Admission: EM | Admit: 2013-11-26 | Discharge: 2013-11-27 | Disposition: A | Discharge status: Home / Self Care | Payer: BC Managed Care – PPO | Attending: Emergency Medicine | Admitting: Emergency Medicine

## 2013-11-26 ENCOUNTER — Emergency Department (HOSPITAL_COMMUNITY): Payer: BC Managed Care – PPO

## 2013-11-26 ENCOUNTER — Encounter (HOSPITAL_COMMUNITY): Payer: Self-pay | Admitting: Emergency Medicine

## 2013-11-26 DIAGNOSIS — Z9889 Other specified postprocedural states: Secondary | ICD-10-CM | POA: Insufficient documentation

## 2013-11-26 DIAGNOSIS — R0602 Shortness of breath: Secondary | ICD-10-CM | POA: Insufficient documentation

## 2013-11-26 DIAGNOSIS — Z8709 Personal history of other diseases of the respiratory system: Secondary | ICD-10-CM | POA: Insufficient documentation

## 2013-11-26 DIAGNOSIS — R079 Chest pain, unspecified: Secondary | ICD-10-CM | POA: Insufficient documentation

## 2013-11-26 DIAGNOSIS — Z91040 Latex allergy status: Secondary | ICD-10-CM | POA: Insufficient documentation

## 2013-11-26 DIAGNOSIS — R51 Headache: Secondary | ICD-10-CM | POA: Insufficient documentation

## 2013-11-26 DIAGNOSIS — E785 Hyperlipidemia, unspecified: Secondary | ICD-10-CM | POA: Insufficient documentation

## 2013-11-26 DIAGNOSIS — Z8679 Personal history of other diseases of the circulatory system: Secondary | ICD-10-CM | POA: Insufficient documentation

## 2013-11-26 DIAGNOSIS — Z853 Personal history of malignant neoplasm of breast: Secondary | ICD-10-CM | POA: Insufficient documentation

## 2013-11-26 DIAGNOSIS — M549 Dorsalgia, unspecified: Secondary | ICD-10-CM | POA: Insufficient documentation

## 2013-11-26 DIAGNOSIS — Z7982 Long term (current) use of aspirin: Secondary | ICD-10-CM | POA: Insufficient documentation

## 2013-11-26 DIAGNOSIS — M542 Cervicalgia: Secondary | ICD-10-CM | POA: Insufficient documentation

## 2013-11-26 DIAGNOSIS — I10 Essential (primary) hypertension: Secondary | ICD-10-CM | POA: Insufficient documentation

## 2013-11-26 DIAGNOSIS — Z8719 Personal history of other diseases of the digestive system: Secondary | ICD-10-CM | POA: Insufficient documentation

## 2013-11-26 DIAGNOSIS — Z79899 Other long term (current) drug therapy: Secondary | ICD-10-CM | POA: Insufficient documentation

## 2013-11-26 DIAGNOSIS — R110 Nausea: Secondary | ICD-10-CM | POA: Insufficient documentation

## 2013-11-26 LAB — CBC
HEMATOCRIT: 36.5 % (ref 36.0–46.0)
Hemoglobin: 12.9 g/dL (ref 12.0–15.0)
MCH: 31 pg (ref 26.0–34.0)
MCHC: 35.3 g/dL (ref 30.0–36.0)
MCV: 87.7 fL (ref 78.0–100.0)
PLATELETS: 304 10*3/uL (ref 150–400)
RBC: 4.16 MIL/uL (ref 3.87–5.11)
RDW: 13 % (ref 11.5–15.5)
WBC: 8.2 10*3/uL (ref 4.0–10.5)

## 2013-11-26 LAB — BASIC METABOLIC PANEL
BUN: 14 mg/dL (ref 6–23)
CO2: 22 mEq/L (ref 19–32)
Calcium: 10.5 mg/dL (ref 8.4–10.5)
Chloride: 100 mEq/L (ref 96–112)
Creatinine, Ser: 0.74 mg/dL (ref 0.50–1.10)
GFR calc Af Amer: >90 mL/min (ref >90)
GFR, EST NON AFRICAN AMERICAN: 89 mL/min — AB (ref >90)
Glucose, Bld: 99 mg/dL (ref 70–99)
POTASSIUM: 4 meq/L (ref 3.7–5.3)
Sodium: 136 mEq/L — ABNORMAL LOW (ref 137–147)

## 2013-11-26 LAB — POCT I-STAT TROPONIN I: TROPONIN I, POC: 0 ng/mL (ref 0.00–0.08)

## 2013-11-26 LAB — TROPONIN I: Troponin I: <0.3 ng/mL (ref <0.30)

## 2013-11-26 MED ORDER — SODIUM CHLORIDE 0.9 % IV SOLN: INTRAVENOUS | Status: DC

## 2013-11-26 MED ORDER — FAMOTIDINE IN NACL 20-0.9 MG/50ML-% IV SOLN
20.0000 mg | Freq: Once | INTRAVENOUS | Status: AC
  Administered 2013-11-26: 20 mg via INTRAVENOUS
  Filled 2013-11-26: qty 50

## 2013-11-26 MED ORDER — IOHEXOL 350 MG/ML SOLN
100.0000 mL | Freq: Once | INTRAVENOUS | Status: AC | PRN
  Administered 2013-11-26: 100 mL via INTRAVENOUS

## 2013-11-26 MED ORDER — SODIUM CHLORIDE 0.9 % IV BOLUS (SEPSIS)
250.0000 mL | Freq: Once | INTRAVENOUS | Status: AC
  Administered 2013-11-26: 250 mL via INTRAVENOUS

## 2013-11-26 NOTE — ED Notes (Signed)
63 y.o. Presents with new onset of Right sided face pains, center of back radiating around to the front of her chest today. States that she has been have HA more than normal. No LOC, slight nausea but no vomiting. 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 Received a total of 405 mg of ASA. Per [facility] vitals 163/94 resp 20 hr 73 oxygen 97% on room air. Family is at bedside.

## 2013-11-26 NOTE — ED Notes (Signed)
Bedside report

## 2013-11-26 NOTE — ED Notes (Signed)
Ambulated to the bathroom w/o difficulty.

## 2013-11-27 MED ORDER — ASPIRIN 81 MG PO CHEW: 81.0000 mg | CHEWABLE_TABLET | Freq: Every day | ORAL | Status: DC

## 2013-11-27 NOTE — ED Provider Notes (Signed)
CSN: [id number]     Arrival date & time 11/26/13  1718 History   First MD Initiated Contact with Patient 11/26/13 1757     Chief Complaint  Patient presents with  . Chest Pain   (Consider location/radiation/quality/duration/timing/severity/associated sxs/prior Treatment) Patient is a 63 y.o. female presenting with chest pain. The history is provided by the patient and the EMS personnel.  Chest Pain Associated symptoms: back pain, headache, nausea and shortness of breath   Associated symptoms: no abdominal pain, no fever and not vomiting    patient brought in by EMS from her workplace. Patient works at [employer]. Patient while at work shortly prior arrival had acute onset of right-sided face pain that radiated from the right temple to the right jaw. Onset was at 4:00 in the afternoon. The pain then moved into her back area that pain was sharp. Patient had some substernal chest pain pressure very mild. The jaw pain and the back pain as worse was 10 out of 10 associated with some nausea some mild shortness of breath and nausea with mild to no vomiting. Patient was given aspirin prior to arrival. Patient states she had a cardiac cath in the she believes in 2013 that showed no significant problems. Patient has no history of coronary artery disease. She does have cardiac risk factors of hypertension hyperlipidemia. Patient is now completely pain free.  Past Medical History  Diagnosis Date  . Breast cancer   . Hemorrhoids   . Hiatal hernia 2014    large  . Pulmonary nodules   . Hyperparathyroidism   . Hypertension   . Hyperlipidemia    Past Surgical History  Procedure Laterality Date  . Mastectomy Right   . Bladder suspension    . Cesarean section      x 2  . Tubal ligation    . Cholecystectomy    . Total elbow replacement    . Parathyroidectomy     Family History  Problem Relation Age of Onset  . Breast cancer    . Hypertension    . Heart disease     History  Substance Use  Topics  . Smoking status: Never Smoker   . Smokeless tobacco: Never Used  . Alcohol Use: 0.6 oz/week    1 Glasses of wine per week     Comment: not even once a month    OB History   Grav Para Term Preterm Abortions TAB SAB Ect Mult Living                 Review of Systems  Constitutional: Negative for fever.  HENT: Negative for congestion.   Eyes: Negative for redness.  Respiratory: Positive for shortness of breath.   Cardiovascular: Positive for chest pain. Negative for leg swelling.  Gastrointestinal: Positive for nausea. Negative for vomiting and abdominal pain.  Genitourinary: Negative for dysuria.  Musculoskeletal: Positive for back pain and neck pain.  Neurological: Positive for headaches.  Hematological: Does not bruise/bleed easily.  Psychiatric/Behavioral: Negative for confusion.    Allergies  Anesthetics, ester; Codeine; Tape; and Latex  Home Medications   Current Outpatient Rx  Name  Route  Sig  Dispense  Refill  . aspirin-acetaminophen-caffeine (EXCEDRIN MIGRAINE) 250-250-65 MG per tablet   Oral   Take 2 tablets by mouth every 8 (eight) hours as needed for headache.         Marland Kitchen atorvastatin (LIPITOR) 10 MG tablet   Oral   Take 10 mg by mouth every evening.         Marland Kitchen  CINNAMON PO   Oral   Take 1 tablet by mouth daily.         . Coenzyme Q10 (CO Q 10 PO)   Oral   Take 1 tablet by mouth daily.         . hydrALAZINE (APRESOLINE) 25 MG tablet   Oral   Take 25 mg by mouth 2 (two) times daily.         . nebivolol (BYSTOLIC) 10 MG tablet   Oral   Take 10 mg by mouth daily.         Marland Kitchen omeprazole (PRILOSEC OTC) 20 MG tablet   Oral   Take 20 mg by mouth 2 (two) times daily.         Marland Kitchen OVER THE COUNTER MEDICATION   Oral   Take 1 tablet by mouth every morning.         . valsartan (DIOVAN) 320 MG tablet   Oral   Take 320 mg by mouth daily.         Marland Kitchen aspirin 81 MG chewable tablet   Oral   Chew 1 tablet (81 mg total) by mouth daily.    14 tablet   2    BP 150/97  Pulse 76  Temp(Src) 98.7 F (37.1 C) (Oral)  Resp 11  SpO2 94% Physical Exam  Nursing note and vitals reviewed. Constitutional: She is oriented to person, place, and time. She appears well-developed and well-nourished. No distress.  HENT:  Head: Normocephalic and atraumatic.  Mouth/Throat: Oropharynx is clear and moist.  Eyes: Conjunctivae are normal. Pupils are equal, round, and reactive to light.  Neck: Normal range of motion.  Cardiovascular: Normal rate, regular rhythm and normal heart sounds.   No murmur heard. Pulmonary/Chest: Effort normal and breath sounds normal.  Abdominal: Soft. Bowel sounds are normal. There is no tenderness.  Musculoskeletal: Normal range of motion. She exhibits no edema.  Neurological: She is alert and oriented to person, place, and time. No cranial nerve deficit. She exhibits normal muscle tone. Coordination normal.  Skin: Skin is warm. No rash noted.    ED Course  Procedures (including critical care time) Labs Review Labs Reviewed  BASIC METABOLIC PANEL - Abnormal; Notable for the following:    Sodium 136 (*)    GFR calc non Af Amer 89 (*)    All other components within normal limits  CBC  TROPONIN I  POCT I-STAT TROPONIN I   Results for orders placed during the hospital encounter of 11/26/13  CBC      Result Value Range   WBC 8.2  4.0 - 10.5 K/uL   RBC 4.16  3.87 - 5.11 MIL/uL   Hemoglobin 12.9  12.0 - 15.0 g/dL   HCT 97.5  88.3 - 25.4 %   MCV 87.7  78.0 - 100.0 fL   MCH 31.0  26.0 - 34.0 pg   MCHC 35.3  30.0 - 36.0 g/dL   RDW 98.2  64.1 - 58.3 %   Platelets 304  150 - 400 K/uL  BASIC METABOLIC PANEL      Result Value Range   Sodium 136 (*) 137 - 147 mEq/L   Potassium 4.0  3.7 - 5.3 mEq/L   Chloride 100  96 - 112 mEq/L   CO2 22  19 - 32 mEq/L   Glucose, Bld 99  70 - 99 mg/dL   BUN 14  6 - 23 mg/dL   Creatinine, Ser 0.94  0.50 - 1.10 mg/dL  Calcium 10.5  8.4 - 10.5 mg/dL   GFR calc non Af Amer  89 (*) >90 mL/min   GFR calc Af Amer >90  >90 mL/min  TROPONIN I      Result Value Range   Troponin I <0.30  <0.30 ng/mL  POCT I-STAT TROPONIN I      Result Value Range   Troponin i, poc 0.00  0.00 - 0.08 ng/mL   Comment 3             Imaging Review Ct Angio Chest Pe W/cm &/or Wo Cm  11/26/2013   CLINICAL DATA:  Chest pain and shortness of breath. History of breast cancer.  EXAM: CT ANGIOGRAPHY CHEST WITH CONTRAST  TECHNIQUE: Multidetector CT imaging of the chest was performed using the standard protocol during bolus administration of intravenous contrast. Multiplanar CT image reconstructions including MIPs were obtained to evaluate the vascular anatomy.  CONTRAST:  140mL OMNIPAQUE IOHEXOL 350 MG/ML SOLN  COMPARISON:  DG CHEST 1V PORT dated 11/26/2013; CT CHEST W/CM dated 01/29/2013  FINDINGS: Lungs/Pleura: Mild motion degradation inferiorly. A perifissural 4 mm nodule along the right minor fissure is similar to on the prior exam. Other perifissural smaller nodules are felt to be similar and likely subpleural lymph nodes.  A perifissural 2 mm left lower lobe nodule on image 54 is also similar and likely a subpleural lymph node.  No pleural fluid.  Heart/Mediastinum: The quality of this examination for evaluation of pulmonary embolism is good. No evidence of pulmonary embolism. No supraclavicular adenopathy. Right mastectomy and axillary node dissection. No axillary adenopathy. Normal aortic caliber without dissection. Heart size upper normal. LAD coronary artery atherosclerosis. No pericardial effusion. No mediastinal or hilar adenopathy. A moderate hiatal hernia. Fat containing right-sided Bochdalek's hernia.  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  Bones/Musculoskeletal:  No acute osseous abnormality.  Review of the MIP images confirms the above findings.  IMPRESSION: 1.  No evidence of pulmonary embolism. 2. No other explanation for chest pain. 3. Age advanced  coronary artery atherosclerosis. Recommend assessment of coronary risk factors and consideration of medical therapy. 4. Right mastectomy, without evidence of metastatic disease. 5. Moderate hiatal hernia. 6. Similar 9 mm splenic artery aneurysm.   Electronically Signed   By: Abigail Miyamoto M.D.   On: 11/26/2013 23:43   Dg Chest Port 1 View  11/26/2013   CLINICAL DATA:  Right-sided chest pain.  08/20/2012  EXAM: PORTABLE CHEST - 1 VIEW  COMPARISON:  08/20/2012  FINDINGS: The heart size is normal. Both lungs are clear. Small to moderate hiatal hernia again noted.  IMPRESSION: No active cardiopulmonary disease.  Small to moderate hiatal hernia.   Electronically Signed   By: Earle Gell M.D.   On: 11/26/2013 19:37    EKG Interpretation    Date/Time:  Tuesday November 26 2013 17:24:27 EST Ventricular Rate:  61 PR Interval:  160 QRS Duration: 85 QT Interval:  414 QTC Calculation: 417 R Axis:   74 Text Interpretation:  Sinus rhythm No significant change since last tracing Confirmed by Augusta Mirkin  MD, Tarshia Kot ([id number]) on 11/26/2013 6:03:45 PM            MDM   1. Chest pain    Workup for the chest pain negative troponins x2 chest x-ray negative for pneumonia CT angios negative for pulmonary edema pulmonary embolus pneumothorax or pneumonia.  Patient had cardiac cath in 2012 with minimal disease making it very unlikely that today's events  are related to coronary artery disease. Patient has been pain-free in the emergency department since prior to arrival she arrived here at 5:30. Patient will be started on baby aspirin has followup with [facility]. Patient will return for any new or worse chest pain.    Mervin Kung, MD 11/27/13 [phone]

## 2013-11-27 NOTE — Discharge Instructions (Signed)
Aspirin and Your Heart Aspirin affects the way your blood clots and helps "thin" the blood. Aspirin has many uses in heart disease. It may be used as a primary prevention to help reduce the risk of heart related events. It also can be used as a secondary measure to prevent more heart attacks or to prevent additional damage from blood clots.  ASPIRIN MAY HELP IF YOU:  Have had a heart attack or chest pain.  Have undergone open heart surgery such as CABG (Coronary Artery Bypass Surgery).  Have had coronary angioplasty with or without stents.  Have experienced a stroke or TIA (transient ischemic attack).  Have peripheral vascular disease (PAD).  Have chronic heart rhythm problems such as atrial fibrillation.  Are at risk for heart disease. BEFORE STARTING ASPIRIN Before you start taking aspirin, your caregiver will need to review your medical history. Many things will need to be taken into consideration, such as:  Smoking status.  Blood pressure.  Diabetes.  Gender.  Weight.  Cholesterol level. ASPIRIN DOSES  Aspirin should only be taken on the advice of your caregiver. Talk to your caregiver about how much aspirin you should take. Aspirin comes in different doses such as:  81 mg.  162 mg.  325 mg.  The aspirin dose you take may be affected by many factors, some of which include:  Your current medications, especially if your are taking blood-thinners or anti-platelet medicine.  Liver function.  Heart disease risk.  Age.  Aspirin comes in two forms:  Non-enteric-coated. This type of aspirin does not have a coating and is absorbed faster. Non-enteric coated aspirin is recommended for patients experiencing chest pain symptoms. This type of aspirin also comes in a chewable form.  Enteric-coated. This means the aspirin has a special coating that releases the medicine very slowly. Enteric-coated aspirin causes less stomach upset. This type of aspirin should not be chewed  or crushed. ASPIRIN SIDE EFFECTS Daily use of aspirin can increase your risk of serious side effects. Some of these include:  Increased bleeding. This can range from a cut that does not stop bleeding to more serious problems such as stomach bleeding or bleeding into the brain (Intracerebral bleeding).  Increased bruising.  Stomach upset.  An allergic reaction such as red, itchy skin.  Increased risk of bleeding when combined with non-steroidal anti-inflammatory medicine (NSAIDS).  Alcohol should be drank in moderation when taking aspirin. Alcohol can increase the risk of stomach bleeding when taken with aspirin.  Aspirin should not be given to children less than 29 years of age due to the association of Reye syndrome. Reye syndrome is a serious illness that can affect the brain and liver. Studies have linked Reye syndrome with aspirin use in children.  People that have nasal polyps have an increased risk of developing an aspirin allergy. SEEK MEDICAL CARE IF:   You develop an allergic reaction such as:  Hives.  Itchy skin.  Swelling of the lips, tongue or face.  You develop stomach pain.  You have unusual bleeding or bruising.  You have ringing in your ears. SEEK IMMEDIATE MEDICAL CARE IF:   You have severe chest pain, especially if the pain is crushing or pressure-like and spreads to the arms, back, neck, or jaw. THIS IS AN EMERGENCY. Do not wait to see if the pain will go away. Get medical help at once. Call your local emergency services (911 in the [place] DO NOT drive yourself to the hospital.  You have stroke-like symptoms  such as:  Loss of vision.  Difficulty talking.  Numbness or weakness on one side of your body.  Numbness or weakness in your arm or leg.  Not thinking clearly or feeling confused.  Your bowel movements are bloody, dark red or black in color.  You vomit or cough up blood.  You have blood in your urine.  You have shortness of breath,  coughing or wheezing. MAKE SURE YOU:   Understand these instructions.  Will monitor your condition.  Seek immediate medical care if necessary. Document Released: 10/06/2008 Document Revised: 02/18/2013 Document Reviewed: 10/06/2008 [facility] Patient Information 2014 [place].  Make an appointment to followup with your primary care doctor at [facility]. Start a baby aspirin a day. Work note provided to be out of work for the next 2 days. It is important that you return for any recurrent chest pain or pain in the jaw or back that lasts 15 or 20 minutes or longer.

## 2013-12-09 ENCOUNTER — Telehealth: Payer: Self-pay | Admitting: Neurology

## 2013-12-09 NOTE — Telephone Encounter (Signed)
Patient was called and an appointment was scheduled for headaches on 12/10/2013.

## 2013-12-10 ENCOUNTER — Encounter: Payer: Self-pay | Admitting: Neurology

## 2013-12-10 ENCOUNTER — Ambulatory Visit (INDEPENDENT_AMBULATORY_CARE_PROVIDER_SITE_OTHER): Payer: BC Managed Care – PPO | Admitting: Neurology

## 2013-12-10 VITALS — BP 154/92 | HR 81 | Ht 61.0 in | Wt 190.0 lb

## 2013-12-10 DIAGNOSIS — R51 Headache: Secondary | ICD-10-CM

## 2013-12-10 DIAGNOSIS — R519 Headache, unspecified: Secondary | ICD-10-CM | POA: Insufficient documentation

## 2013-12-10 NOTE — Progress Notes (Signed)
PATIENT: Sarah Cook DOB: [birth year]  HISTORICAL  Sarah Cook is a 63 years old right-handed Caucasian female, referred by her primary care physician Dr. Orland Penman for evaluation of one episode of severe headache I saw her previously for mild headaches, and abnormal MRI scan. Last visit in May 2013.  She has past medical history of hypertension, hyperlipidemia, obesity, over the past one year, she has frequent headaches, transient sharp pain in her right occipital region, also bilateral frontal pressure headaches, she been taking over-the-counter Excedrin, which has helped her headaches,  She also had past medical history of breast cancer, status post right lobectomy, followed by chemotherapy, parathyroid adenoma, status post surgery, she complains of excessive fatigue,  She was referred for MRI of brain, which has demonstrated mild small vessel disease, I have reviewed the films together with patient, she denied lateralized motor or sensory deficit, she denied visual change,she's not on antiplatelet agent  UPDATE Jan 20th 2015:  She was working infront of computer since June 2014, which can be stressful, in January twentieth 2015, she noticed sudden right parasaggital vertex pain, travel down her right face, face, back, radiated out, she was afraid, it was 3:50pm in the afternoon, she sat quietly for few mintues,  It is the worst pain she ever had, lasting few minutes,   She called her nurse, was put into wheelchair, 911 was called, she was taken by umbulance to ED, she also complained of pressure on her chest,  CT angiogram of chest showed no evidence of pulmonary embolism. 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 Recommend  assessment of coronary risk factors and consideration of medical therapy. Right mastectomy, without evidence of metastatic disease. Moderate hiatal hernia.  9 mm splenic artery aneurysm  she also reported a few episodes of "brain in on pause", she has word finding  difficulties occasionally, 2 of 3 episodes in past one month   REVIEW OF SYSTEMS: Full 14 system review of systems performed and notable only for ringing to in her ears, confusion  ALLERGIES: Allergies  Allergen Reactions  . Anesthetics, Ester Nausea And Vomiting  . Codeine Nausea Only  . Tape Other (See Comments)    Skin blisters  . Latex Itching and Rash    Irritates skin    HOME MEDICATIONS: Outpatient Prescriptions Prior to Visit  Medication Sig Dispense Refill  . aspirin 81 MG chewable tablet Chew 1 tablet (81 mg total) by mouth daily.  14 tablet  2  . aspirin-acetaminophen-caffeine (EXCEDRIN MIGRAINE) 628-315-17 MG per tablet Take 2 tablets by mouth every 8 (eight) hours as needed for headache.      Marland Kitchen atorvastatin (LIPITOR) 10 MG tablet Take 10 mg by mouth every evening.      Marland Kitchen CINNAMON PO Take 1 tablet by mouth daily.      . Coenzyme Q10 (CO Q 10 PO) Take 1 tablet by mouth daily.      . hydrALAZINE (APRESOLINE) 25 MG tablet Take 25 mg by mouth 2 (two) times daily.      . nebivolol (BYSTOLIC) 10 MG tablet Take 10 mg by mouth daily.      Marland Kitchen omeprazole (PRILOSEC OTC) 20 MG tablet Take 20 mg by mouth 2 (two) times daily.      Marland Kitchen OVER THE COUNTER MEDICATION Take 1 tablet by mouth every morning.      . valsartan (DIOVAN) 320 MG tablet Take 320 mg by mouth daily.       No facility-administered medications prior to visit.  PAST MEDICAL HISTORY: Past Medical History  Diagnosis Date  . Breast cancer   . Hemorrhoids   . Hiatal hernia 2014    large  . Pulmonary nodules   . Hyperparathyroidism   . Hypertension   . Hyperlipidemia   . Heart disease     PAST SURGICAL HISTORY: Past Surgical History  Procedure Laterality Date  . Mastectomy Right   . Bladder suspension    . Cesarean section      x 2  . Tubal ligation    . Cholecystectomy    . Total elbow replacement    . Parathyroidectomy      FAMILY HISTORY: Family History  Problem Relation Age of Onset  . Breast  cancer Mother   . Hypertension    . Heart disease      SOCIAL HISTORY:  History   Social History  . Marital Status: Married    Spouse Name: Ollen Gross    Number of Children: 3  . Years of Education: college   Occupational History  . 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 Smoking status: Never Smoker   . Smokeless tobacco: Never Used  . Alcohol Use: 0.6 oz/week    1 Glasses of wine per week     Comment: On Holidays  . Drug Use: No  . Sexual Activity: Not Currently   Other Topics Concern  . Not on file   Social History Narrative   Patient is married Ollen Gross) and lives at home with her husband.   Patient works at [employer].   Patient is right-handed.   Education college   Caffeine one cup daily                 PHYSICAL EXAM   Filed Vitals:   12/10/13 1535  BP: 154/92  Pulse: 81  Height: 5\' 1"  (1.549 m)  Weight: 190 lb (86.183 kg)    Not recorded    Body mass index is 35.92 kg/(m^2).   Generalized: In no acute distress  Neck: Supple, no carotid bruits   Cardiac: Regular rate rhythm  Pulmonary: Clear to auscultation bilaterally  Musculoskeletal: No deformity  Neurological examination  Mentation: Alert oriented to time, place, history taking, and causual conversation  Cranial nerve II-XII: Pupils were equal round reactive to light extraocular movements were full, Visual field were full on confrontational test. Bilateral fundi were sharp.  Facial sensation and strength were normal. Hearing was intact to finger rubbing bilaterally. Uvula tongue midline.  head turning and shoulder shrug and were normal and symmetric.Tongue protrusion into cheek strength was normal.  Motor: normal tone, bulk and strength.  Sensory: Intact to fine touch, pinprick, preserved vibratory sensation, and proprioception at toes.  Coordination: Normal finger to nose, heel-to-shin bilaterally there was no truncal ataxia  Gait: Rising up from seated position  without assistance, normal stance, without trunk ataxia, moderate stride, good arm swing, smooth turning, able to perform tiptoe, and heel walking without difficulty.   Romberg signs: Negative  Deep tendon reflexes: Brachioradialis 2/2, biceps 2/2, triceps 2/2, patellar 2/2, Achilles 2/2, plantar responses were flexor bilaterally.   DIAGNOSTIC DATA (LABS, IMAGING, TESTING) - I reviewed patient records, labs, notes, testing and imaging myself where available.  Lab Results  Component Value Date   WBC 8.2 11/26/2013   HGB 12.9 11/26/2013   HCT 36.5 11/26/2013   MCV 87.7 11/26/2013   PLT 304 11/26/2013      Component Value Date/Time   NA 136* 11/26/2013  1924   K 4.0 11/26/2013 1924   CL 100 11/26/2013 1924   CO2 22 11/26/2013 1924   GLUCOSE 99 11/26/2013 1924   BUN 14 11/26/2013 1924   CREATININE 0.74 11/26/2013 1924   CALCIUM 10.5 11/26/2013 1924   PROT 6.9 08/20/2012 1542   ALBUMIN 3.9 08/20/2012 1542   AST 27 08/20/2012 1542   ALT 26 08/20/2012 1542   ALKPHOS 86 08/20/2012 1542   BILITOT 1.1 08/20/2012 1542   GFRNONAA 89* 11/26/2013 1924   GFRAA >90 11/26/2013 1924    ASSESSMENT AND PLAN   63 years old right-handed Caucasian female, with past medical history of hypertension, hyperlipidemia, obesity, presenting with had onset of severe headaches, CAT scan of the abdomen showed 9 mm splenic artery aneurysm headaches.  1 need to rule out mild subarachnoid hemorrhage  2 proceed with MRI of brain, MRA of brain 3 I will call her report.        Marcial Pacas, M.D. Ph.D.  [facility] [address] [place], [place] [address] [phone]

## 2013-12-11 ENCOUNTER — Encounter: Payer: Self-pay | Admitting: Internal Medicine

## 2013-12-11 ENCOUNTER — Ambulatory Visit (INDEPENDENT_AMBULATORY_CARE_PROVIDER_SITE_OTHER): Payer: BC Managed Care – PPO | Admitting: Internal Medicine

## 2013-12-11 VITALS — BP 124/74 | HR 90 | Ht 61.0 in | Wt 190.6 lb

## 2013-12-11 DIAGNOSIS — K589 Irritable bowel syndrome without diarrhea: Secondary | ICD-10-CM | POA: Insufficient documentation

## 2013-12-11 MED ORDER — HYOSCYAMINE SULFATE 0.125 MG SL SUBL
0.1250 mg | SUBLINGUAL_TABLET | SUBLINGUAL | Status: DC | PRN
Start: 2013-12-11 — End: 2016-02-03

## 2013-12-11 MED ORDER — MOVIPREP 100 G PO SOLR: 1.0000 | Freq: Once | ORAL | Status: DC

## 2013-12-11 NOTE — Patient Instructions (Addendum)
You have been scheduled for a colonoscopy with propofol. Please follow written instructions given to you at your visit today.  Please pick up your prep kit at the pharmacy within the next 1-3 days. If you use inhalers (even only as needed), please bring them with you on the day of your procedure. Your physician has requested that you go to www.startemmi.com and enter the access code given to you at your visit today. This web site gives a general overview about your procedure. However, you should still follow specific instructions given to you by our office regarding your preparation for the procedure.  We have sent the following medications to your pharmacy for you to pick up at your convenience: Levsin SL  CC: Dr Radene Knee, Dr Orland Penman

## 2013-12-11 NOTE — Progress Notes (Signed)
Sarah Cook Sep 24, 1951 [id number]  Note: This dictation was prepared with Dragon digital system. Any transcriptional errors that result from this procedure are unintentional.   History of Present Illness:  This is a 63 year old white female who has had a change in her bowel habits. She describes urgent diarrhea and unpredictable bowel movements, especially in the mornings which sometimes keeps her from going to church or to work. She has a history of breast cancer and anxiety. She is here to schedule a colonoscopy. Her last exam in October 2005 showed hemorrhoids. There is no family history of colon cancer. 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. She suffers from headaches.     Past Medical History  Diagnosis Date  . Breast cancer   . Hemorrhoids   . Hiatal hernia 2014    large  . Pulmonary nodules   . Hyperparathyroidism   . Hypertension   . Hyperlipidemia   . Heart disease     Past Surgical History  Procedure Laterality Date  . Mastectomy Right   . Bladder suspension    . Cesarean section      x 2  . Tubal ligation    . Cholecystectomy    . Total elbow replacement    . Parathyroidectomy      Allergies  Allergen Reactions  . Anesthetics, Ester Nausea And Vomiting  . Codeine Nausea Only  . Tape Other (See Comments)    Skin blisters  . Latex Itching and Rash    Irritates skin    Family history and social history have been reviewed.  Review of Systems:   The remainder of the 10 point ROS is negative except as outlined in the H&P  Physical Exam: General Appearance Well developed, in no distress Eyes  Non icteric  HEENT  Non traumatic, normocephalic  Mouth No lesion, tongue papillated, no cheilosis Neck Supple without adenopathy, thyroid not enlarged, no carotid bruits, no JVD Lungs Clear to auscultation bilaterally COR Normal S1, normal S2, regular rhythm, no murmur, quiet precordium Abdomen  soft mildly obese with normal active bowel sounds. No mass or  rebound. Rectal  soft Hemoccult negative stool  Extremities  No pedal edema Skin No lesions Neurological Alert and oriented x 3 Psychological Normal mood and affect  Assessment and Plan:   Problem #31 62 year old white female with symptoms suggestive of irritable bowel syndrome. She is Hemoccult negative. Her last colonoscopy was 10 years ago. We will go ahead and schedule a followup colonoscopy. In the meantime, I advised her to stay on a high-fiber diet and start on Levsin sublingually 0.125 mg which she is to use every 4 hours on an as necessary basis.for urgent B.M's.    [name] 12/11/2013

## 2013-12-18 ENCOUNTER — Ambulatory Visit (INDEPENDENT_AMBULATORY_CARE_PROVIDER_SITE_OTHER): Payer: BC Managed Care – PPO

## 2013-12-18 DIAGNOSIS — R51 Headache: Secondary | ICD-10-CM

## 2013-12-20 NOTE — Progress Notes (Signed)
Quick Note:  Butch Penny: Please call patient, MRI of the brain was normal, MRI of the brain showed mild small vessel disease ______

## 2013-12-23 NOTE — Progress Notes (Signed)
Quick Note:  Called and shared normal MR MRA head results thru VM message with patient ______

## 2013-12-26 NOTE — Progress Notes (Signed)
Quick Note:  Spoke to patient and relayed MRI brain results, per Dr. Krista Blue. ______

## 2014-02-12 ENCOUNTER — Encounter: Payer: Self-pay | Admitting: Internal Medicine

## 2014-02-12 ENCOUNTER — Ambulatory Visit (AMBULATORY_SURGERY_CENTER): Payer: BC Managed Care – PPO | Admitting: Internal Medicine

## 2014-02-12 ENCOUNTER — Encounter: Payer: BC Managed Care – PPO | Admitting: Internal Medicine

## 2014-02-12 VITALS — BP 149/82 | HR 78 | Temp 98.1°F | Resp 16 | Ht 61.0 in | Wt 190.0 lb

## 2014-02-12 DIAGNOSIS — Z1211 Encounter for screening for malignant neoplasm of colon: Secondary | ICD-10-CM

## 2014-02-12 DIAGNOSIS — K589 Irritable bowel syndrome without diarrhea: Secondary | ICD-10-CM

## 2014-02-12 MED ORDER — SODIUM CHLORIDE 0.9 % IV SOLN
500.0000 mL | INTRAVENOUS | Status: DC
Start: 2014-02-12 — End: 2014-02-13

## 2014-02-12 NOTE — Patient Instructions (Signed)

## 2014-02-12 NOTE — Progress Notes (Signed)
Pt states she couldn't find her instructions- ate solids yesterday and then did prep.  She states she completed prep and results are clear.  Dr. Olevia Perches aware and no new orders received.  She drank a "sip of water at 9:30"  CRNA aware and ok received to proceed with procedure

## 2014-02-12 NOTE — Progress Notes (Signed)
A/ox3 pleased with MAC, report to April RN 

## 2014-02-12 NOTE — Op Note (Signed)
[place]  [address] [place], [address]   COLONOSCOPY PROCEDURE REPORT  PATIENT: [name], [name]  MR#: [id number] BIRTHDATE: [birth year] , 62  yrs. old GENDER: Female ENDOSCOPIST: Lafayette Dragon, MD REFERRED GL:OVFIE NNODI, M.D. PROCEDURE DATE:  02/12/2014 PROCEDURE:   Colonoscopy, screening First Screening Colonoscopy - Avg.  risk and is 50 yrs.  old or older - No.  Prior Negative Screening - Now for repeat screening. 10 or more years since last screening  History of Adenoma - Now for follow-up colonoscopy & has been > or = to 3 yrs.  N/A  Polyps Removed Today? No.  Recommend repeat exam, <10 yrs? No. ASA CLASS:   Class II INDICATIONS:Average risk patient for colon cancer and irritable bowel syndrome.  Change in bowel habits.  Her last colonoscopy October 2005. MEDICATIONS: MAC sedation, administered by CRNA and propofol (Diprivan) 250mg  IV  DESCRIPTION OF PROCEDURE:   After the risks benefits and alternatives of the procedure were thoroughly explained, informed consent was obtained.  A digital rectal exam revealed no abnormalities of the rectum.   The     endoscope was introduced through the anus and advanced to the cecum, which was identified by both the appendix and ileocecal valve. No adverse events experienced.   The quality of the prep was good, using MoviPrep The instrument was then slowly withdrawn as the colon was fully examined.      COLON FINDINGS: There was moderate diverticulosis noted throughout the entire examined colon with associated tortuosity and muscular hypertrophy.  Retroflexed views revealed no abnormalities. The time to cecum=8 minutes 18 seconds.  Withdrawal time=6 minutes 44 seconds.  The scope was withdrawn and the procedure completed. COMPLICATIONS: There were no complications.  ENDOSCOPIC IMPRESSION: There was moderate diverticulosis noted throughout the entire examined colon  RECOMMENDATIONS: high-fiber  diet Recall colonoscopy in 10 years   eSigned:  Lafayette Dragon, MD 02/12/2014 11:38 AM   cc:   PATIENT NAME:  Sarah Cook, Sarah Cook MR#: [id number]

## 2014-02-13 ENCOUNTER — Telehealth: Payer: Self-pay | Admitting: *Deleted

## 2014-02-13 NOTE — Telephone Encounter (Signed)
  Follow up Call-  Call back number 02/12/2014  Post procedure Call Back phone  # work [phone]  Permission to leave phone message No     Patient questions:  Do you have a fever, pain , or abdominal swelling? no Pain Score  0 *  Have you tolerated food without any problems? yes  Have you been able to return to your normal activities? yes  Do you have any questions about your discharge instructions: Diet   no Medications  yes Follow up visit  no  Do you have questions or concerns about your Care? no  Actions: * If pain score is 4 or above: No action needed, pain <4. Patient has started Benefiber, discussed with patient gradually adding to recommended dosage. Informed the patient to drink plenty of water.

## 2014-03-26 ENCOUNTER — Other Ambulatory Visit (HOSPITAL_COMMUNITY): Payer: Self-pay | Admitting: Obstetrics and Gynecology

## 2014-03-26 DIAGNOSIS — Z1231 Encounter for screening mammogram for malignant neoplasm of breast: Secondary | ICD-10-CM

## 2014-04-15 ENCOUNTER — Ambulatory Visit (HOSPITAL_COMMUNITY)
Admission: RE | Admit: 2014-04-15 | Discharge: 2014-04-15 | Disposition: A | Discharge status: Home / Self Care | Payer: BC Managed Care – PPO | Source: Ambulatory Visit | Attending: Obstetrics and Gynecology | Admitting: Obstetrics and Gynecology

## 2014-04-15 DIAGNOSIS — Z1231 Encounter for screening mammogram for malignant neoplasm of breast: Secondary | ICD-10-CM | POA: Diagnosis present

## 2014-04-30 ENCOUNTER — Encounter: Payer: Self-pay | Admitting: Cardiology

## 2014-04-30 DIAGNOSIS — Z853 Personal history of malignant neoplasm of breast: Secondary | ICD-10-CM | POA: Insufficient documentation

## 2014-04-30 DIAGNOSIS — I10 Essential (primary) hypertension: Secondary | ICD-10-CM | POA: Insufficient documentation

## 2014-04-30 DIAGNOSIS — G459 Transient cerebral ischemic attack, unspecified: Secondary | ICD-10-CM | POA: Insufficient documentation

## 2014-04-30 DIAGNOSIS — E785 Hyperlipidemia, unspecified: Secondary | ICD-10-CM | POA: Insufficient documentation

## 2014-04-30 DIAGNOSIS — T7840XA Allergy, unspecified, initial encounter: Secondary | ICD-10-CM | POA: Insufficient documentation

## 2014-04-30 DIAGNOSIS — K449 Diaphragmatic hernia without obstruction or gangrene: Secondary | ICD-10-CM | POA: Insufficient documentation

## 2014-04-30 DIAGNOSIS — I519 Heart disease, unspecified: Secondary | ICD-10-CM | POA: Insufficient documentation

## 2014-04-30 DIAGNOSIS — R918 Other nonspecific abnormal finding of lung field: Secondary | ICD-10-CM | POA: Insufficient documentation

## 2014-04-30 DIAGNOSIS — E213 Hyperparathyroidism, unspecified: Secondary | ICD-10-CM | POA: Insufficient documentation

## 2014-04-30 DIAGNOSIS — K649 Unspecified hemorrhoids: Secondary | ICD-10-CM | POA: Insufficient documentation

## 2014-04-30 DIAGNOSIS — C50919 Malignant neoplasm of unspecified site of unspecified female breast: Secondary | ICD-10-CM | POA: Insufficient documentation

## 2014-04-30 DIAGNOSIS — M810 Age-related osteoporosis without current pathological fracture: Secondary | ICD-10-CM | POA: Insufficient documentation

## 2014-04-30 DIAGNOSIS — F39 Unspecified mood [affective] disorder: Secondary | ICD-10-CM | POA: Insufficient documentation

## 2014-04-30 DIAGNOSIS — F419 Anxiety disorder, unspecified: Secondary | ICD-10-CM | POA: Insufficient documentation

## 2014-04-30 DIAGNOSIS — E215 Disorder of parathyroid gland, unspecified: Secondary | ICD-10-CM | POA: Insufficient documentation

## 2014-04-30 DIAGNOSIS — H269 Unspecified cataract: Secondary | ICD-10-CM | POA: Insufficient documentation

## 2014-11-18 ENCOUNTER — Other Ambulatory Visit: Payer: Self-pay | Admitting: Obstetrics and Gynecology

## 2014-11-19 LAB — CYTOLOGY - PAP

## 2015-04-17 ENCOUNTER — Other Ambulatory Visit (HOSPITAL_COMMUNITY): Payer: Self-pay | Admitting: Family Medicine

## 2015-04-17 DIAGNOSIS — Z1231 Encounter for screening mammogram for malignant neoplasm of breast: Secondary | ICD-10-CM

## 2015-05-01 ENCOUNTER — Ambulatory Visit (HOSPITAL_COMMUNITY)
Admission: RE | Admit: 2015-05-01 | Discharge: 2015-05-01 | Disposition: A | Discharge status: Home / Self Care | Payer: BC Managed Care – PPO | Source: Ambulatory Visit | Attending: Family Medicine | Admitting: Family Medicine

## 2015-05-01 ENCOUNTER — Other Ambulatory Visit (HOSPITAL_COMMUNITY): Payer: Self-pay | Admitting: Family Medicine

## 2015-05-01 DIAGNOSIS — Z1231 Encounter for screening mammogram for malignant neoplasm of breast: Secondary | ICD-10-CM

## 2016-01-27 ENCOUNTER — Emergency Department (HOSPITAL_COMMUNITY): Payer: BC Managed Care – PPO

## 2016-01-27 ENCOUNTER — Emergency Department (HOSPITAL_COMMUNITY)
Admission: EM | Admit: 2016-01-27 | Discharge: 2016-01-27 | Disposition: A | Discharge status: Home / Self Care | Payer: BC Managed Care – PPO | Attending: Emergency Medicine | Admitting: Emergency Medicine

## 2016-01-27 ENCOUNTER — Encounter (HOSPITAL_COMMUNITY): Payer: Self-pay | Admitting: Emergency Medicine

## 2016-01-27 DIAGNOSIS — Z853 Personal history of malignant neoplasm of breast: Secondary | ICD-10-CM | POA: Diagnosis not present

## 2016-01-27 DIAGNOSIS — R079 Chest pain, unspecified: Secondary | ICD-10-CM

## 2016-01-27 DIAGNOSIS — I10 Essential (primary) hypertension: Secondary | ICD-10-CM | POA: Diagnosis not present

## 2016-01-27 DIAGNOSIS — R5383 Other fatigue: Secondary | ICD-10-CM | POA: Diagnosis not present

## 2016-01-27 DIAGNOSIS — R531 Weakness: Secondary | ICD-10-CM | POA: Insufficient documentation

## 2016-01-27 DIAGNOSIS — Z8739 Personal history of other diseases of the musculoskeletal system and connective tissue: Secondary | ICD-10-CM | POA: Diagnosis not present

## 2016-01-27 DIAGNOSIS — Z8719 Personal history of other diseases of the digestive system: Secondary | ICD-10-CM | POA: Diagnosis not present

## 2016-01-27 DIAGNOSIS — Z79899 Other long term (current) drug therapy: Secondary | ICD-10-CM | POA: Insufficient documentation

## 2016-01-27 DIAGNOSIS — Z8673 Personal history of transient ischemic attack (TIA), and cerebral infarction without residual deficits: Secondary | ICD-10-CM | POA: Insufficient documentation

## 2016-01-27 DIAGNOSIS — R519 Headache, unspecified: Secondary | ICD-10-CM

## 2016-01-27 DIAGNOSIS — E785 Hyperlipidemia, unspecified: Secondary | ICD-10-CM | POA: Insufficient documentation

## 2016-01-27 DIAGNOSIS — R51 Headache: Secondary | ICD-10-CM | POA: Diagnosis not present

## 2016-01-27 DIAGNOSIS — F419 Anxiety disorder, unspecified: Secondary | ICD-10-CM | POA: Insufficient documentation

## 2016-01-27 DIAGNOSIS — Z91040 Latex allergy status: Secondary | ICD-10-CM | POA: Insufficient documentation

## 2016-01-27 LAB — HEPATIC FUNCTION PANEL
ALK PHOS: 68 U/L (ref 38–126)
ALT: 23 U/L (ref 14–54)
AST: 24 U/L (ref 15–41)
Albumin: 4 g/dL (ref 3.5–5.0)
BILIRUBIN DIRECT: 0.2 mg/dL (ref 0.1–0.5)
BILIRUBIN INDIRECT: 1.1 mg/dL — AB (ref 0.3–0.9)
BILIRUBIN TOTAL: 1.3 mg/dL — AB (ref 0.3–1.2)
TOTAL PROTEIN: 6.7 g/dL (ref 6.5–8.1)

## 2016-01-27 LAB — BASIC METABOLIC PANEL
Anion gap: 10 (ref 5–15)
BUN: 18 mg/dL (ref 6–20)
CALCIUM: 10.6 mg/dL — AB (ref 8.9–10.3)
CO2: 23 mmol/L (ref 22–32)
CREATININE: 0.86 mg/dL (ref 0.44–1.00)
Chloride: 105 mmol/L (ref 101–111)
GFR calc non Af Amer: >60 mL/min (ref >60)
Glucose, Bld: 100 mg/dL — ABNORMAL HIGH (ref 65–99)
Potassium: 3.9 mmol/L (ref 3.5–5.1)
SODIUM: 138 mmol/L (ref 135–145)

## 2016-01-27 LAB — URINALYSIS, ROUTINE W REFLEX MICROSCOPIC
BILIRUBIN URINE: NEGATIVE
Glucose, UA: NEGATIVE mg/dL
HGB URINE DIPSTICK: NEGATIVE
Ketones, ur: NEGATIVE mg/dL
Leukocytes, UA: NEGATIVE
Nitrite: NEGATIVE
PROTEIN: NEGATIVE mg/dL
Specific Gravity, Urine: 1.014 (ref 1.005–1.030)
pH: 6.5 (ref 5.0–8.0)

## 2016-01-27 LAB — CBC
HCT: 33.9 % — ABNORMAL LOW (ref 36.0–46.0)
Hemoglobin: 11.9 g/dL — ABNORMAL LOW (ref 12.0–15.0)
MCH: 30.1 pg (ref 26.0–34.0)
MCHC: 35.1 g/dL (ref 30.0–36.0)
MCV: 85.8 fL (ref 78.0–100.0)
PLATELETS: 271 10*3/uL (ref 150–400)
RBC: 3.95 MIL/uL (ref 3.87–5.11)
RDW: 13 % (ref 11.5–15.5)
WBC: 6.3 10*3/uL (ref 4.0–10.5)

## 2016-01-27 LAB — TSH: TSH: 2.338 u[IU]/mL (ref 0.350–4.500)

## 2016-01-27 LAB — TROPONIN I: Troponin I: <0.03 ng/mL (ref <0.031)

## 2016-01-27 LAB — SEDIMENTATION RATE: Sed Rate: 20 mm/hr (ref 0–22)

## 2016-01-27 LAB — LIPASE, BLOOD: LIPASE: 27 U/L (ref 11–51)

## 2016-01-27 LAB — CK: Total CK: 87 U/L (ref 38–234)

## 2016-01-27 MED ORDER — FAMOTIDINE 20 MG PO TABS: 20.0000 mg | ORAL_TABLET | Freq: Two times a day (BID) | ORAL | Status: DC

## 2016-01-27 NOTE — ED Provider Notes (Signed)
CSN: [id number]     Arrival date & time 01/27/16  1708 History   First MD Initiated Contact with Patient 01/27/16 1752     Chief Complaint  Patient presents with  . Weakness  . Headache     (Consider location/radiation/quality/duration/timing/severity/associated sxs/prior Treatment) HPI For several weeks the patient poor she's been getting increasingly fatigued. She states that sometimes is difficult for her to complete all of her work or to do regular household chores. She has not had fevers or chills. She does note that intermittently she's had some sensation of pressure in her epigastrium and lower chest. There is not been any present today. She also notes that she's getting periodic headaches. Often this is on the top of her head to the right and may radiated towards her cheek. She also reports however occasionally has been on the left. She has not had focal weakness numbness or tingling. No vomiting or diarrhea. No urinary symptoms. Patient reports she had something similar about a year or 2 ago. She states after diagnostic workup it ultimately resolved without a specific diagnosis. 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 Past Medical History  Diagnosis Date  . Breast cancer ([place])   . Hemorrhoids   . Hiatal hernia 2014    large  . Pulmonary nodules   . Hyperparathyroidism ([place])   . Hypertension   . Hyperlipidemia   . Heart disease   . Allergy   . Anxiety   . TIA (transient ischemic attack)   . Cataract     early signs of  . Osteoporosis   . Parathyroid disorder [facility])    Past Surgical History  Procedure Laterality Date  . Mastectomy Right   . Bladder suspension    . Cesarean section      x 2  . Tubal ligation    . Cholecystectomy    . Total elbow replacement    . Parathyroidectomy     Family History  Problem Relation Age of Onset  . Breast cancer Mother   . Hypertension    . Heart disease    . Colon  cancer Neg Hx   . Esophageal cancer Neg Hx   . Stomach cancer Neg Hx   . Rectal cancer Neg Hx    Social History  Substance Use Topics  . Smoking status: Never Smoker   . Smokeless tobacco: Never Used  . Alcohol Use: No     Comment: On Holidays   OB History    No data available     Review of Systems 10 Systems reviewed and are negative for acute change except as noted in the HPI.    Allergies  Anesthetics, ester; Codeine; Tape; and Latex  Home Medications   Prior to Admission medications   Medication Sig Start Date End Date Taking? Authorizing Provider  amitriptyline (ELAVIL) 25 MG tablet Take 12.5 mg by mouth at bedtime. 01/22/16  Yes Historical Provider, MD  aspirin-acetaminophen-caffeine (EXCEDRIN MIGRAINE) [phone] MG per tablet Take 2 tablets by mouth every 8 (eight) hours as needed for headache.   Yes Historical Provider, MD  atorvastatin (LIPITOR) 10 MG tablet Take 10 mg by mouth every evening.   Yes Historical Provider, MD  CINNAMON PO Take 1 tablet by mouth daily.   Yes Historical Provider, MD  Coenzyme Q10 (CO Q 10 PO) Take 1 tablet by mouth daily.   Yes Historical Provider, MD  hydrALAZINE (APRESOLINE) 50 MG tablet Take 1 tablet by mouth 2 (  two) times daily. 12/15/15  Yes Historical Provider, MD  nebivolol (BYSTOLIC) 10 MG tablet Take 10 mg by mouth daily.   Yes Historical Provider, MD  omeprazole (PRILOSEC OTC) 20 MG tablet Take 20 mg by mouth 2 (two) times daily.   Yes Historical Provider, MD  valsartan (DIOVAN) 320 MG tablet Take 320 mg by mouth daily.   Yes Historical Provider, MD  aspirin 81 MG chewable tablet Chew 1 tablet (81 mg total) by mouth daily. Patient not taking: Reported on 01/27/2016 11/27/13   Fredia Sorrow, MD  famotidine (PEPCID) 20 MG tablet Take 1 tablet (20 mg total) by mouth 2 (two) times daily. 01/27/16   Charlesetta Shanks, MD  hyoscyamine (LEVSIN SL) 0.125 MG SL tablet Place 1 tablet (0.125 mg total) under the tongue every 4 (four) hours as  needed for cramping. Patient not taking: Reported on 01/27/2016 12/11/13   Lafayette Dragon, MD   BP 148/73 mmHg  Pulse 68  Temp(Src) 98.2 F (36.8 C) (Oral)  Resp 22  SpO2 98% Physical Exam  Constitutional: She is oriented to person, place, and time. She appears well-developed and well-nourished.  HENT:  Head: Normocephalic and atraumatic.  Mouth/Throat: Oropharynx is clear and moist.  Eyes: EOM are normal. Pupils are equal, round, and reactive to light.  Neck: Neck supple. No thyromegaly present.  Cardiovascular: Normal rate, regular rhythm, normal heart sounds and intact distal pulses.   Pulmonary/Chest: Effort normal and breath sounds normal.  Abdominal: Soft. Bowel sounds are normal. She exhibits no distension. There is no tenderness.  Musculoskeletal: Normal range of motion. She exhibits no edema or tenderness.  Lymphadenopathy:    She has no cervical adenopathy.  Neurological: She is alert and oriented to person, place, and time. She has normal strength. Coordination normal. GCS eye subscore is 4. GCS verbal subscore is 5. GCS motor subscore is 6.  Skin: Skin is warm, dry and intact.  Psychiatric: She has a normal mood and affect.    ED Course  Procedures (including critical care time) Labs Review Labs Reviewed  BASIC METABOLIC PANEL - Abnormal; Notable for the following:    Glucose, Bld 100 (*)    Calcium 10.6 (*)    All other components within normal limits  CBC - Abnormal; Notable for the following:    Hemoglobin 11.9 (*)    HCT 33.9 (*)    All other components within normal limits  HEPATIC FUNCTION PANEL - Abnormal; Notable for the following:    Total Bilirubin 1.3 (*)    Indirect Bilirubin 1.1 (*)    All other components within normal limits  URINALYSIS, ROUTINE W REFLEX MICROSCOPIC (NOT AT [facility])  TSH  SEDIMENTATION RATE  CK  LIPASE, BLOOD  TROPONIN I  T3, FREE    Imaging Review Dg Chest 2 View  01/27/2016  CLINICAL DATA:  Headache and hypertension. History  of breast carcinoma EXAM: CHEST  2 VIEW COMPARISON:  Chest radiograph November 26, 2013; chest CT November 26, 2013 FINDINGS: There is no edema or consolidation. The heart size and pulmonary vascularity are normal. No adenopathy. There is a hiatal hernia. No bone lesions. Patient is status post right mastectomy. IMPRESSION: No edema or consolidation. Hiatal hernia present. Status post right mastectomy. Electronically Signed   By: Lowella Grip III M.D.   On: 01/27/2016 19:39   Ct Head Wo Contrast  01/27/2016  CLINICAL DATA:  Acute onset right-sided headache today. EXAM: CT HEAD WITHOUT CONTRAST TECHNIQUE: Contiguous axial images were obtained from the  base of the skull through the vertex without intravenous contrast. COMPARISON:  Brain MRI on 03/07/2012 FINDINGS: No evidence of intracranial hemorrhage, brain edema, or other signs of acute infarction. No evidence of intracranial mass lesion or mass effect. No abnormal extraaxial fluid collections identified. Ventricles are normal in size. No skull abnormality identified. IMPRESSION: Negative noncontrast head CT. Electronically Signed   By: Earle Gell M.D.   On: 01/27/2016 19:56   I have personally reviewed and evaluated these images and lab results as part of my medical decision-making.   EKG Interpretation   Date/Time:  Wednesday January 27 2016 17:37:48 EDT Ventricular Rate:  63 PR Interval:  161 QRS Duration: 83 QT Interval:  405 QTC Calculation: 415 R Axis:   73 Text Interpretation:  Sinus rhythm normal Confirmed by Johnney Killian, MD, Jeannie Done  [phone]) on 01/27/2016 10:01:26 PM      MDM   Final diagnoses:  Weakness  Other fatigue  Acute nonintractable headache, unspecified headache type  Chest pain, unspecified chest pain type   Patient presents with variable symptoms of a lancinating headache that occasionally radiates on the back of her neck and to her cheek. No signs of infectious etiology. This sounds most consistent with a neuralgia.  Patient also is experiencing a pressure-like sensation in her epigastrium and lower chest. This waxes and wanes. Patient does have a known hiatal hernia. EKG and cardiac enzymes are normal. This sounds atypical for ischemic pain. Due to patient's age however she is counseled to follow-up with cardiology for reevaluation and determination of need for stress testing. Patient is also counseled on signs and symptoms worsen return.  She does have an appointment scheduled at this time for April 20 with her PCP. She is counseled to call them tomorrow to let them know she had an emergency department visit and to schedule follow-up.    Charlesetta Shanks, MD 01/27/16 2218

## 2016-01-27 NOTE — ED Notes (Addendum)
Patient report pain in head that radiates down into right cheek into back. Also reports off and on chest pressure. No chest pressure today. Hx of TIA

## 2016-01-27 NOTE — Discharge Instructions (Signed)
Weakness Weakness is a lack of strength. It may be felt all over the body (generalized) or in one specific part of the body (focal). Some causes of weakness can be serious. You may need further medical evaluation, especially if you are elderly or you have a history of immunosuppression (such as chemotherapy or HIV), kidney disease, heart disease, or diabetes. CAUSES  Weakness can be caused by many different things, including:  Infection.  Physical exhaustion.  Internal bleeding or other blood loss that results in a lack of red blood cells (anemia).  Dehydration. This cause is more common in elderly people.  Side effects or electrolyte abnormalities from medicines, such as pain medicines or sedatives.  Emotional distress, anxiety, or depression.  Circulation problems, especially severe peripheral arterial disease.  Heart disease, such as rapid atrial fibrillation, bradycardia, or heart failure.  Nervous system disorders, such as Guillain-Barr syndrome, multiple sclerosis, or stroke. DIAGNOSIS  To find the cause of your weakness, your caregiver will take your history and perform a physical exam. Lab tests or X-rays may also be ordered, if needed. TREATMENT  Treatment of weakness depends on the cause of your symptoms and can vary greatly. HOME CARE INSTRUCTIONS   Rest as needed.  Eat a well-balanced diet.  Try to get some exercise every day.  Only take over-the-counter or prescription medicines as directed by your caregiver. SEEK MEDICAL CARE IF:   Your weakness seems to be getting worse or spreads to other parts of your body.  You develop new aches or pains. SEEK IMMEDIATE MEDICAL CARE IF:   You cannot perform your normal daily activities, such as getting dressed and feeding yourself.  You cannot walk up and down stairs, or you feel exhausted when you do so.  You have shortness of breath or chest pain.  You have difficulty moving parts of your body.  You have weakness  in only one area of the body or on only one side of the body.  You have a fever.  You have trouble speaking or swallowing.  You cannot control your bladder or bowel movements.  You have black or bloody vomit or stools. MAKE SURE YOU:  Understand these instructions.  Will watch your condition.  Will get help right away if you are not doing well or get worse.   This information is not intended to replace advice given to you by your health care provider. Make sure you discuss any questions you have with your health care provider.   Document Released: 10/24/2005 Document Revised: 04/24/2012 Document Reviewed: 12/23/2011 Elsevier Interactive Patient Education 2016 [place] Headache Without Cause A headache is pain or discomfort felt around the head or neck area. The specific cause of a headache may not be found. There are many causes and types of headaches. A few common ones are:  Tension headaches.  Migraine headaches.  Cluster headaches.  Chronic daily headaches. HOME CARE INSTRUCTIONS  Watch your condition for any changes. Take these steps to help with your condition: Managing Pain  Take over-the-counter and prescription medicines only as told by your health care provider.  Lie down in a dark, quiet room when you have a headache.  If directed, apply ice to the head and neck area:  Put ice in a plastic bag.  Place a towel between your skin and the bag.  Leave the ice on for 20 minutes, 2-3 times per day.  Use a heating pad or hot shower to apply heat to the head and neck  area as told by your health care provider.  Keep lights dim if bright lights bother you or make your headaches worse. Eating and Drinking  Eat meals on a regular schedule.  Limit alcohol use.  Decrease the amount of caffeine you drink, or stop drinking caffeine. General Instructions  Keep all follow-up visits as told by your health care provider. This is important.  Keep a  headache journal to help find out what may trigger your headaches. For example, write down:  What you eat and drink.  How much sleep you get.  Any change to your diet or medicines.  Try massage or other relaxation techniques.  Limit stress.  Sit up straight, and do not tense your muscles.  Do not use tobacco products, including cigarettes, chewing tobacco, or e-cigarettes. If you need help quitting, ask your health care provider.  Exercise regularly as told by your health care provider.  Sleep on a regular schedule. Get 7-9 hours of sleep, or the amount recommended by your health care provider. SEEK MEDICAL CARE IF:   Your symptoms are not helped by medicine.  You have a headache that is different from the usual headache.  You have nausea or you vomit.  You have a fever. SEEK IMMEDIATE MEDICAL CARE IF:   Your headache becomes severe.  You have repeated vomiting.  You have a stiff neck.  You have a loss of vision.  You have problems with speech.  You have pain in the eye or ear.  You have muscular weakness or loss of muscle control.  You lose your balance or have trouble walking.  You feel faint or pass out.  You have confusion.   This information is not intended to replace advice given to you by your health care provider. Make sure you discuss any questions you have with your health care provider.   Document Released: 10/24/2005 Document Revised: 07/15/2015 Document Reviewed: 02/16/2015 Elsevier Interactive Patient Education 2016 Elsevier Inc[employer] Nonspecific Chest Pain  Chest pain can be caused by many different conditions. There is always a chance that your pain could be related to something serious, such as a heart attack or a blood clot in your lungs. Chest pain can also be caused by conditions that are not life-threatening. If you have chest pain, it is very important to follow up with your health care provider. CAUSES  Chest pain can be caused  by:  Heartburn.  Pneumonia or bronchitis.  Anxiety or stress.  Inflammation around your heart (pericarditis) or lung (pleuritis or pleurisy).  A blood clot in your lung.  A collapsed lung (pneumothorax). It can develop suddenly on its own (spontaneous pneumothorax) or from trauma to the chest.  Shingles infection (varicella-zoster virus).  Heart attack.  Damage to the bones, muscles, and cartilage that make up your chest wall. This can include:  Bruised bones due to injury.  Strained muscles or cartilage due to frequent or repeated coughing or overwork.  Fracture to one or more ribs.  Sore cartilage due to inflammation (costochondritis). RISK FACTORS  Risk factors for chest pain may include:  Activities that increase your risk for trauma or injury to your chest.  Respiratory infections or conditions that cause frequent coughing.  Medical conditions or overeating that can cause heartburn.  Heart disease or family history of heart disease.  Conditions or health behaviors that increase your risk of developing a blood clot.  Having had chicken pox (varicella zoster). SIGNS AND SYMPTOMS Chest pain can feel like:  Burning  or tingling on the surface of your chest or deep in your chest.  Crushing, pressure, aching, or squeezing pain.  Dull or sharp pain that is worse when you move, cough, or take a deep breath.  Pain that is also felt in your back, neck, shoulder, or arm, or pain that spreads to any of these areas. Your chest pain may come and go, or it may stay constant. DIAGNOSIS Lab tests or other studies may be needed to find the cause of your pain. Your health care provider may have you take a test called an ambulatory ECG (electrocardiogram). An ECG records your heartbeat patterns at the time the test is performed. You may also have other tests, such as:  Transthoracic echocardiogram (TTE). During echocardiography, sound waves are used to create a picture of all  of the heart structures and to look at how blood flows through your heart.  Transesophageal echocardiogram (TEE).This is a more advanced imaging test that obtains images from inside your body. It allows your health care provider to see your heart in finer detail.  Cardiac monitoring. This allows your health care provider to monitor your heart rate and rhythm in real time.  Holter monitor. This is a portable device that records your heartbeat and can help to diagnose abnormal heartbeats. It allows your health care provider to track your heart activity for several days, if needed.  Stress tests. These can be done through exercise or by taking medicine that makes your heart beat more quickly.  Blood tests.  Imaging tests. TREATMENT  Your treatment depends on what is causing your chest pain. Treatment may include:  Medicines. These may include:  Acid blockers for heartburn.  Anti-inflammatory medicine.  Pain medicine for inflammatory conditions.  Antibiotic medicine, if an infection is present.  Medicines to dissolve blood clots.  Medicines to treat coronary artery disease.  Supportive care for conditions that do not require medicines. This may include:  Resting.  Applying heat or cold packs to injured areas.  Limiting activities until pain decreases. HOME CARE INSTRUCTIONS  If you were prescribed an antibiotic medicine, finish it all even if you start to feel better.  Avoid any activities that bring on chest pain.  Do not use any tobacco products, including cigarettes, chewing tobacco, or electronic cigarettes. If you need help quitting, ask your health care provider.  Do not drink alcohol.  Take medicines only as directed by your health care provider.  Keep all follow-up visits as directed by your health care provider. This is important. This includes any further testing if your chest pain does not go away.  If heartburn is the cause for your chest pain, you may be  told to keep your head raised (elevated) while sleeping. This reduces the chance that acid will go from your stomach into your esophagus.  Make lifestyle changes as directed by your health care provider. These may include:  Getting regular exercise. Ask your health care provider to suggest some activities that are safe for you.  Eating a heart-healthy diet. A registered dietitian can help you to learn healthy eating options.  Maintaining a healthy weight.  Managing diabetes, if necessary.  Reducing stress. SEEK MEDICAL CARE IF:  Your chest pain does not go away after treatment.  You have a rash with blisters on your chest.  You have a fever. SEEK IMMEDIATE MEDICAL CARE IF:   Your chest pain is worse.  You have an increasing cough, or you cough up blood.  You have  severe abdominal pain.  You have severe weakness.  You faint.  You have chills.  You have sudden, unexplained chest discomfort.  You have sudden, unexplained discomfort in your arms, back, neck, or jaw.  You have shortness of breath at any time.  You suddenly start to sweat, or your skin gets clammy.  You feel nauseous or you vomit.  You suddenly feel light-headed or dizzy.  Your heart begins to beat quickly, or it feels like it is skipping beats. These symptoms may represent a serious problem that is an emergency. Do not wait to see if the symptoms will go away. Get medical help right away. Call your local emergency services (911 in the [place] Do not drive yourself to the hospital.   This information is not intended to replace advice given to you by your health care provider. Make sure you discuss any questions you have with your health care provider.   Document Released: 08/03/2005 Document Revised: 11/14/2014 Document Reviewed: 05/30/2014 Elsevier Interactive Patient Education [employer].

## 2016-01-27 NOTE — ED Notes (Signed)
Patient transported to CT 

## 2016-01-29 LAB — T3, FREE: T3 FREE: 3.3 pg/mL (ref 2.0–4.4)

## 2016-02-03 ENCOUNTER — Ambulatory Visit (INDEPENDENT_AMBULATORY_CARE_PROVIDER_SITE_OTHER): Payer: BC Managed Care – PPO | Admitting: Cardiovascular Disease

## 2016-02-03 ENCOUNTER — Encounter: Payer: Self-pay | Admitting: Cardiovascular Disease

## 2016-02-03 VITALS — BP 152/76 | HR 66 | Ht 61.0 in | Wt 178.0 lb

## 2016-02-03 DIAGNOSIS — I10 Essential (primary) hypertension: Secondary | ICD-10-CM | POA: Diagnosis not present

## 2016-02-03 DIAGNOSIS — E785 Hyperlipidemia, unspecified: Secondary | ICD-10-CM

## 2016-02-03 DIAGNOSIS — Z8241 Family history of sudden cardiac death: Secondary | ICD-10-CM

## 2016-02-03 DIAGNOSIS — R0789 Other chest pain: Secondary | ICD-10-CM | POA: Diagnosis not present

## 2016-02-03 DIAGNOSIS — Z9221 Personal history of antineoplastic chemotherapy: Secondary | ICD-10-CM

## 2016-02-03 DIAGNOSIS — R0602 Shortness of breath: Secondary | ICD-10-CM | POA: Diagnosis not present

## 2016-02-03 NOTE — Patient Instructions (Addendum)
Your physician recommends that you continue on your current medications as directed. Please refer to the Current Medication list given to you today.  Your physician has requested that you have an echocardiogram. Echocardiography is a painless test that uses sound waves to create images of your heart. It provides your doctor with information about the size and shape of your heart and how well your heart's chambers and valves are working. This procedure takes approximately one hour. There are no restrictions for this procedure.  Your physician has requested that you have an exercise tolerance test. For further information please visit HugeFiesta.tn. Please also follow instruction sheet, as given.  Dr Sallyanne Kuster recommends that you schedule a follow-up appointment after tests.  If you need a refill on your cardiac medications before your next appointment, please call your pharmacy.

## 2016-02-04 ENCOUNTER — Encounter: Payer: Self-pay | Admitting: Cardiovascular Disease

## 2016-02-04 DIAGNOSIS — R079 Chest pain, unspecified: Secondary | ICD-10-CM | POA: Insufficient documentation

## 2016-02-04 DIAGNOSIS — Z8241 Family history of sudden cardiac death: Secondary | ICD-10-CM | POA: Insufficient documentation

## 2016-02-04 DIAGNOSIS — Z9221 Personal history of antineoplastic chemotherapy: Secondary | ICD-10-CM | POA: Insufficient documentation

## 2016-02-04 NOTE — Progress Notes (Signed)
Patient ID: Sarah Cook, female   DOB: [birth year], 65 y.o.   MRN: [id number]    Cardiology Office Note    Date:  02/04/2016   ID:  Sarah Cook, DOB 1951/11/03, MRN [id number]  PCP:  Sarah Pound, MD  Cardiologist:   Sarah Klein, MD   Chief Complaint  Patient presents with  . New Patient (Initial Visit)    pt c/o swelling in ankles/ feet during day, SOB upon exhertion, frequent headaches     History of Present Illness:  Sarah Cook is a 65 y.o. female with a history of hypertension, hyperlipidemia and complaints of shortness of breath and chest pain.   She presents with approximate one-year history of dyspnea on exertion that has gradually progressed to where it occurs even with light housework. She also describes occasional chest pressure but this is not associated with physical activity. On the contrary it appears to be associated with episodes of headache and jaw discomfort which are described as brief and sharp lancinating shocks, suggestive of neuralgia. The discomfort radiates down her neck to the interscapular area as well. His episodes often occur when she is sitting at her computer.  Her family history is significant for congestive heart failure and sudden cardiac death in her father who died at age 58. She has no history of diabetes or smoking. She takes a statin for hyperlipidemia and has usually well-controlled hypertension. Her blood pressure is slightly elevated today, and this has been a bit of a problem recently. She denies a history of palpitations, syncope, leg edema, claudication, other focal neurological complaints. She has a remote history of Bell's palsy without residual sequelae. There is a history of previous transient ischemic attack, poorly remembered and poorly defined.  In May 2012 show underwent cardiac catheterization, due to complaints of persisting shortness of breath and exertional chest pain. That study showed no evidence of any significant coronary  artery disease (not even mention of nonobstructive disease) and showed normal left ventricular systolic function. Left ventricular end-diastolic pressure was mildly increased at 19 mmHg. There was no evidence of aortic valve stenosis.  Nevertheless, CT angiography of the chest performed to exclude pulmonary embolism in January 2015 showed "age advanced coronary artery atherosclerosis".  I cannot find evidence of previous echocardiography in her chart. She mentions having these tests performed in 2013 and 2015. She had a remote mastectomy for right breast cancer treated with chemotherapy that included Adriamycin (2000). She did not receive chest radiation therapy.   Past Medical History  Diagnosis Date  . Breast cancer ([place])   . Hemorrhoids   . Hiatal hernia 2014    large  . Pulmonary nodules   . Hyperparathyroidism ([place])   . Hypertension   . Hyperlipidemia   . Heart disease   . Allergy   . Anxiety   . TIA (transient ischemic attack)   . Cataract     early signs of  . Osteoporosis   . Parathyroid disorder [facility])     Past Surgical History  Procedure Laterality Date  . Mastectomy Right   . Bladder suspension    . Cesarean section      x 2  . Tubal ligation    . Cholecystectomy    . Total elbow replacement    . Parathyroidectomy      Current Medications: Outpatient Prescriptions Prior to Visit  Medication Sig Dispense Refill  . amitriptyline (ELAVIL) 25 MG tablet Take 12.5 mg by mouth at bedtime.  1  . aspirin-acetaminophen-caffeine (  EXCEDRIN MIGRAINE) 250-250-65 MG per tablet Take 2 tablets by mouth every 8 (eight) hours as needed for headache.    Marland Kitchen atorvastatin (LIPITOR) 10 MG tablet Take 10 mg by mouth every evening.    Marland Kitchen CINNAMON PO Take 1 tablet by mouth daily.    . Coenzyme Q10 (CO Q 10 PO) Take 1 tablet by mouth daily.    . famotidine (PEPCID) 20 MG tablet Take 1 tablet (20 mg total) by mouth 2 (two) times daily. 30 tablet 0  . hydrALAZINE (APRESOLINE) 50 MG tablet  Take 1 tablet by mouth 2 (two) times daily.  1  . nebivolol (BYSTOLIC) 10 MG tablet Take 10 mg by mouth daily.    Marland Kitchen omeprazole (PRILOSEC OTC) 20 MG tablet Take 20 mg by mouth 2 (two) times daily.    . valsartan (DIOVAN) 320 MG tablet Take 320 mg by mouth daily.    Marland Kitchen aspirin 81 MG chewable tablet Chew 1 tablet (81 mg total) by mouth daily. (Patient not taking: Reported on 01/27/2016) 14 tablet 2  . hyoscyamine (LEVSIN SL) 0.125 MG SL tablet Place 1 tablet (0.125 mg total) under the tongue every 4 (four) hours as needed for cramping. (Patient not taking: Reported on 01/27/2016) 30 tablet 0   No facility-administered medications prior to visit.     Allergies:   Anesthetics, ester; Codeine; Tape; and Latex   Social History   Social History  . Marital Status: Married    Spouse Name: Sarah Cook  . Number of Children: 3  . Years of Education: college   Occupational History  . 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 Smoking status: Never Smoker   . Smokeless tobacco: Never Used  . Alcohol Use: No     Comment: On Holidays  . Drug Use: No  . Sexual Activity: Not Currently   Other Topics Concern  . None   Social History Narrative   Patient is married Sarah Cook) and lives at home with her husband.   Patient works at [employer].   Patient is right-handed.   Education college   Caffeine one cup daily                 Family History:  The patient's family history includes Breast cancer in her mother. There is no history of Colon cancer, Esophageal cancer, Stomach cancer, or Rectal cancer. Father had sudden death at age 3 and reportedly had congestive heart failure  ROS:   Please see the history of present illness.    ROS All other systems reviewed and are negative.   PHYSICAL EXAM:   VS:  BP 152/76 mmHg  Pulse 66  Ht 5\' 1"  (1.549 m)  Wt 80.74 kg (178 lb)  BMI 33.65 kg/m2   GEN: Well nourished, well developed, in no acute distress HEENT: normal Neck: no JVD,  carotid bruits, or masses Cardiac: RRR; no murmurs, rubs, or gallops,no edema  Respiratory:  clear to auscultation bilaterally, normal work of breathing GI: soft, nontender, nondistended, + BS MS: no deformity or atrophy Skin: warm and dry, no rash Neuro:  Alert and Oriented x 3, Strength and sensation are intact Psych: euthymic mood, full affect  Wt Readings from Last 3 Encounters:  02/03/16 80.74 kg (178 lb)  02/12/14 86.183 kg (190 lb)  12/11/13 86.456 kg (190 lb 9.6 oz)      Studies/Labs Reviewed:   EKG:  EKG is ordered today.  The ekg ordered today demonstrates Normal sinus rhythm, QTC  429 ms  Recent Labs: 01/27/2016: ALT 23; BUN 18; Creatinine, Ser 0.86; Hemoglobin 11.9*; Platelets 271; Potassium 3.9; Sodium 138; TSH 2.338    ASSESSMENT:    1. Shortness of breath   2. Chest pressure   3. Hyperlipidemia   4. Essential hypertension   5. History of antineoplastic chemotherapy with cardiotoxic drugs   6. Family history of sudden cardiac death in father      PLAN:  In order of problems listed above:  1. Dyspnea on exertion: This could indeed represent congestive heart failure. It would be hard to implicate previous chemotherapy since LV angiography in 2012, 12 years after treatment showed normal LV function. Her blood pressure has usually been well treated, but recently has been higher. The major concern would be progression of coronary atherosclerosis needing to ischemic cardiomyopathy. We'll start by checking echocardiogram. 2. Chest pain: This is highly atypical and seems to be related to some type of neuralgic process. However, in view of the history of prominent coronary artery calcifications seen on CT of the chest stress testing is recommended. 3. Hyperlipidemia: Recent labs not available. She is on a relatively low-dose statin dose. 4. Hypertension: Usually well-controlled, recently with mild systolic hypertension, unlikely to explain her symptoms 5. History of  Adriamycin chemotherapy, unlikely to be a cause of current symptoms 6. Unclear if her father had coronary artery disease, but he did have a history of congestive heart failure prior to sudden cardiac death.  If her echocardiogram shows left ventricular systolic dysfunction or if her stress test is abnormal I would proceed directly to coronary angiography.  Medication Adjustments/Labs and Tests Ordered: Current medicines are reviewed at length with the patient today.  Concerns regarding medicines are outlined above.  Medication changes, Labs and Tests ordered today are listed in the Patient Instructions below. Patient Instructions  Your physician recommends that you continue on your current medications as directed. Please refer to the Current Medication list given to you today.  Your physician has requested that you have an echocardiogram. Echocardiography is a painless test that uses sound waves to create images of your heart. It provides your doctor with information about the size and shape of your heart and how well your heart's chambers and valves are working. This procedure takes approximately one hour. There are no restrictions for this procedure.  Your physician has requested that you have an exercise tolerance test. For further information please visit HugeFiesta.[email or web address]. Please also follow instruction sheet, as given.  Dr Sallyanne Kuster recommends that you schedule a follow-up appointment after tests.  If you need a refill on your cardiac medications before your next appointment, please call your pharmacy.    Mikael Spray, MD  02/04/2016 3:46 PM    [facility] [address], [place], [place]  [address] Phone: [phone]; Fax: [phone]

## 2016-02-18 ENCOUNTER — Telehealth (HOSPITAL_COMMUNITY): Payer: Self-pay

## 2016-02-18 ENCOUNTER — Ambulatory Visit (HOSPITAL_COMMUNITY): Payer: BC Managed Care – PPO | Attending: Cardiovascular Disease

## 2016-02-18 ENCOUNTER — Other Ambulatory Visit: Payer: Self-pay

## 2016-02-18 ENCOUNTER — Ambulatory Visit: Payer: BC Managed Care – PPO | Admitting: Cardiology

## 2016-02-18 DIAGNOSIS — E785 Hyperlipidemia, unspecified: Secondary | ICD-10-CM | POA: Insufficient documentation

## 2016-02-18 DIAGNOSIS — R0602 Shortness of breath: Secondary | ICD-10-CM | POA: Insufficient documentation

## 2016-02-18 DIAGNOSIS — R0600 Dyspnea, unspecified: Secondary | ICD-10-CM | POA: Diagnosis present

## 2016-02-18 DIAGNOSIS — I071 Rheumatic tricuspid insufficiency: Secondary | ICD-10-CM | POA: Insufficient documentation

## 2016-02-18 DIAGNOSIS — I059 Rheumatic mitral valve disease, unspecified: Secondary | ICD-10-CM | POA: Diagnosis not present

## 2016-02-18 DIAGNOSIS — I10 Essential (primary) hypertension: Secondary | ICD-10-CM | POA: Insufficient documentation

## 2016-02-18 DIAGNOSIS — C50919 Malignant neoplasm of unspecified site of unspecified female breast: Secondary | ICD-10-CM | POA: Diagnosis not present

## 2016-02-18 DIAGNOSIS — R0789 Other chest pain: Secondary | ICD-10-CM | POA: Insufficient documentation

## 2016-02-18 NOTE — Telephone Encounter (Signed)
Encounter complete. 

## 2016-02-19 ENCOUNTER — Telehealth: Payer: Self-pay

## 2016-02-19 MED ORDER — FUROSEMIDE 40 MG PO TABS: 40.0000 mg | ORAL_TABLET | Freq: Every day | ORAL | Status: DC

## 2016-02-19 NOTE — Telephone Encounter (Signed)
Called patient with results/recommendations. Patient verbalized understanding and appreciation for "quick turn around." Rx for furosemide filled to patient's preferred pharmacy, [facility] [address] [facility] [address].  Patient has appointment already scheduled for 03/09/16 at 3p.

## 2016-02-19 NOTE — Telephone Encounter (Signed)
-----   Message from Sanda Klein, MD sent at 02/19/2016  8:52 AM EDT ----- Echo shows normal heart pumping strength, EF 60%, no sign of chemo related heart problems. There is however evidence of heart relaxation problems and increased "filling pressures" which can explain her shortness of breath. Please start furosemide 40 mg daily, ask her to keep a log of daily weights, restrict sodium in diet and make a f/u appt first available.

## 2016-02-23 ENCOUNTER — Ambulatory Visit (HOSPITAL_COMMUNITY)
Admission: RE | Admit: 2016-02-23 | Discharge: 2016-02-23 | Disposition: A | Discharge status: Home / Self Care | Payer: BC Managed Care – PPO | Source: Ambulatory Visit | Attending: Cardiovascular Disease | Admitting: Cardiovascular Disease

## 2016-02-23 DIAGNOSIS — R0602 Shortness of breath: Secondary | ICD-10-CM | POA: Diagnosis present

## 2016-02-23 DIAGNOSIS — R0789 Other chest pain: Secondary | ICD-10-CM | POA: Diagnosis not present

## 2016-02-23 LAB — EXERCISE TOLERANCE TEST
CHL CUP MPHR: 155 {beats}/min
CHL RATE OF PERCEIVED EXERTION: 16
CSEPED: 6 min
CSEPEW: 7 METS
CSEPHR: 86 %
Exercise duration (sec): 47 s
Peak HR: 134 {beats}/min
Rest HR: 71 {beats}/min

## 2016-03-09 ENCOUNTER — Encounter: Payer: Self-pay | Admitting: Cardiovascular Disease

## 2016-03-09 ENCOUNTER — Ambulatory Visit (INDEPENDENT_AMBULATORY_CARE_PROVIDER_SITE_OTHER): Payer: BC Managed Care – PPO | Admitting: Cardiovascular Disease

## 2016-03-09 VITALS — BP 129/82 | HR 83 | Ht 61.5 in | Wt 170.6 lb

## 2016-03-09 DIAGNOSIS — E785 Hyperlipidemia, unspecified: Secondary | ICD-10-CM

## 2016-03-09 DIAGNOSIS — I10 Essential (primary) hypertension: Secondary | ICD-10-CM

## 2016-03-09 DIAGNOSIS — I5032 Chronic diastolic (congestive) heart failure: Secondary | ICD-10-CM | POA: Insufficient documentation

## 2016-03-09 NOTE — Patient Instructions (Signed)
Medication Instructions:  No Changes  Labwork: None  Testing/Procedures: None   Follow-Up: 56months with Dr Sallyanne Kuster  Any Other Special Instructions Will Be Listed Below (If Applicable).     If you need a refill on your cardiac medications before your next appointment, please call your pharmacy.

## 2016-03-09 NOTE — Progress Notes (Signed)
Patient ID: Sarah Cook, female   DOB: 07/01/1951, 65 y.o.   MRN: [id number] Patient ID: Sarah Cook, female   DOB: Apr 21, 1951, 65 y.o.   MRN: [id number]    Cardiology Office Note    Date:  03/09/2016   ID:  Sarah Cook, DOB [birth year], MRN [id number]  PCP:  Cari Caraway, MD  Cardiologist:   Sanda Klein, MD   Chief Complaint  Patient presents with  . Follow-up    followup echo and gxt, no chest pain but patient stated she had chest pain "a little while ago"    History of Present Illness:  Sarah Cook is a 65 y.o. female with a history of hypertension, hyperlipidemia and complaints of shortness of breath and chest pain.   She presents with approximate one-year history of dyspnea on exertion that has gradually progressed to where it occurs even with light housework. She also describes occasional chest pressure but this is not associated with physical activity. On the contrary it appears to be associated with episodes of headache and jaw discomfort which are described as brief and sharp lancinating shocks, suggestive of neuralgia. The discomfort radiates down her neck to the interscapular area as well. His episodes often occur when she is sitting at her computer.  Her family history is significant for congestive heart failure and sudden cardiac death in her father who died at age 72. She has no history of diabetes or smoking. She takes a statin for hyperlipidemia and has usually well-controlled hypertension. Her blood pressure is slightly elevated today, and this has been a bit of a problem recently. She denies a history of palpitations, syncope, leg edema, claudication, other focal neurological complaints. She has a remote history of Bell's palsy without residual sequelae. There is a history of previous transient ischemic attack, poorly remembered and poorly defined.  In May 2012 show underwent cardiac catheterization, due to complaints of persisting shortness of breath and exertional  chest pain. That study showed no evidence of any significant coronary artery disease (not even mention of nonobstructive disease) and showed normal left ventricular systolic function. Left ventricular end-diastolic pressure was mildly increased at 19 mmHg. There was no evidence of aortic valve stenosis.  Nevertheless, CT angiography of the chest performed to exclude pulmonary embolism in January 2015 showed "age advanced coronary artery atherosclerosis".  I cannot find evidence of previous echocardiography in her chart. She mentions having these tests performed in 2013 and 2015. She had a remote mastectomy for right breast cancer treated with chemotherapy that included Adriamycin (2000). She did not receive chest radiation therapy.  Sarah Cook returns in follow-up after undergoing an echocardiogram. This showed normal left ventricular systolic function and regional wall motion, but did show signs of diastolic dysfunction as well as elevated left ventricular filling pressures. There was really no evidence of left ventricular hypertrophy or left ureteral abnormality, but the annular tissue Doppler velocities were clearly low and the E/e' ratio was markedly elevated. Following that test we started treatment with a low dose of loop diuretic, she has lost 7-1/2 pounds in weight and believes there may have been some improvement in her breathing. However, she has not "pushed herself". 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 She is  planning to see an ear nose and throat physician. Several family members have unilateral deafness.   Past Medical History  Diagnosis Date  . Breast cancer ([place])   . Hemorrhoids   . Hiatal hernia 2014    large  .  Pulmonary nodules   . Hyperparathyroidism ([place])   . Hypertension   . Hyperlipidemia   . Heart disease   . Allergy   . Anxiety   . TIA (transient ischemic attack)   . Cataract     early signs of  . Osteoporosis   . Parathyroid disorder [facility])     Past Surgical History  Procedure Laterality Date  . Mastectomy Right   . Bladder suspension    . Cesarean section      x 2  . Tubal ligation    . Cholecystectomy    . Total elbow replacement    . Parathyroidectomy      Current Medications: Outpatient Prescriptions Prior to Visit  Medication Sig Dispense Refill  . amitriptyline (ELAVIL) 25 MG tablet Take 12.5 mg by mouth at bedtime.  1  . aspirin-acetaminophen-caffeine (EXCEDRIN MIGRAINE) [id number] MG per tablet Take 2 tablets by mouth every 8 (eight) hours as needed for headache.    Marland Kitchen atorvastatin (LIPITOR) 10 MG tablet Take 10 mg by mouth every evening.    . Cholecalciferol (VITAMIN D3) 2000 units TABS Take 1 tablet by mouth daily.    Marland Kitchen CINNAMON PO Take 1 tablet by mouth daily.    . Coconut Oil 1000 MG CAPS Take 1 capsule by mouth daily.    . Coenzyme Q10 (CO Q 10 PO) Take 1 tablet by mouth daily.    . furosemide (LASIX) 40 MG tablet Take 1 tablet (40 mg total) by mouth daily. 90 tablet 3  . hydrALAZINE (APRESOLINE) 50 MG tablet Take 1 tablet by mouth 2 (two) times daily.  1  . magnesium oxide (MAG-OX) 400 MG tablet Take 400 mg by mouth daily.    . nebivolol (BYSTOLIC) 10 MG tablet Take 10 mg by mouth daily.    Marland Kitchen omeprazole (PRILOSEC OTC) 20 MG tablet Take 20 mg by mouth 2 (two) times daily.    Marland Kitchen OVER THE COUNTER MEDICATION Tumeric Black pepper 5mg  tab take 1 tab by mouth daily    . Probiotic Product (ALIGN) 4 MG CAPS Take 1 capsule by mouth daily.    . valsartan (DIOVAN) 320 MG tablet Take 320 mg by mouth daily.    . famotidine (PEPCID) 20 MG tablet Take 1 tablet (20 mg total) by mouth 2 (two) times daily. 30 tablet 0  . famotidine-calcium carbonate-magnesium hydroxide (PEPCID  COMPLETE) 10-800-165 MG chewable tablet Chew 1 tablet by mouth daily as needed.     No facility-administered medications prior to visit.     Allergies:   Anesthetics, ester; Codeine; Tape; and Latex   Social History   Social History  . Marital Status: Married    Spouse Name: Ollen Gross  . Number of Children: 3  . Years of Education: college   Occupational History  . 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 Smoking status: Never Smoker   . Smokeless tobacco: Never Used  . Alcohol Use: No     Comment: On Holidays  . Drug Use: No  . Sexual Activity: Not Currently   Other Topics Concern  . None   Social History Narrative   Patient is married Ollen Gross) and lives at home with her husband.   Patient  works at [employer].   Patient is right-handed.   Education college   Caffeine one cup daily                 Family History:  The patient's family history includes Breast cancer in her mother; Heart failure in her father; Sudden death in her father. There is no history of Colon cancer, Esophageal cancer, Stomach cancer, or Rectal cancer. Father had sudden death at age 40 and reportedly had congestive heart failure  ROS:   Please see the history of present illness.    ROS All other systems reviewed and are negative.   PHYSICAL EXAM:   VS:  BP 129/82 mmHg  Pulse 83  Ht 5' 1.5" (1.562 m)  Wt 77.384 kg (170 lb 9.6 oz)  BMI 31.72 kg/m2   GEN: Well nourished, well developed, in no acute distress HEENT: normal Neck: no JVD, carotid bruits, or masses Cardiac: RRR; no murmurs, rubs, or gallops,no edema  Respiratory:  clear to auscultation bilaterally, normal work of breathing GI: soft, nontender, nondistended, + BS MS: no deformity or atrophy Skin: warm and dry, no rash Neuro:  Alert and Oriented x 3, Strength and sensation are intact Psych: euthymic mood, full affect  Wt Readings from Last 3 Encounters:  03/09/16 77.384 kg (170 lb 9.6 oz)  02/03/16 80.74 kg  (178 lb)  02/12/14 86.183 kg (190 lb)      Studies/Labs Reviewed:   EKG:  EKG is ordered today.  The ekg ordered today demonstrates Normal sinus rhythm, QTC 429 ms  Recent Labs: 01/27/2016: ALT 23; BUN 18; Creatinine, Ser 0.86; Hemoglobin 11.9*; Platelets 271; Potassium 3.9; Sodium 138; TSH 2.338    ASSESSMENT:    1. 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[place])   2. Essential hypertension      PLAN:  In order of problems listed above:  1. Diastolic HF: The echo showed normal LV function and there were no ischemic changes on stress testing. Will treat with loop diuretics and careful blood pressure control. At this point coronary angiography does not appear to be necessary. May need to go ahead with a right and left heart catheterization if we cannot improve her breathing with medical therapy. Her chest pain is highly atypical and seems to be related to some type of neuralgic process.  2. Hyperlipidemia: Recent labs not available. She is on a relatively low-dose statin dose. Coronary artery calcification was noted on CT. Treat to a target LDL of less than 100, preferably less than 70. 3. Hypertension: Usually well-controlled. 4. History of Adriamycin chemotherapy, without evidence of cardiomyopathy   Medication Adjustments/Labs and Tests Ordered: Current medicines are reviewed at length with the patient today.  Concerns regarding medicines are outlined above.  Medication changes, Labs and Tests ordered today are listed in the Patient Instructions below. Patient Instructions  Medication Instructions:  No Changes  Labwork: None  Testing/Procedures: None   Follow-Up: 50months with Dr Sallyanne Kuster  Any Other Special Instructions Will Be Listed Below (If Applicable).     If you need a refill on your cardiac medications before your next appointment, please call your pharmacy.       Mikael Spray, MD  03/09/2016 6:32 PM    [facility]  [facility], [place], [place]  [address] Phone: [phone]; Fax: [phone]

## 2016-04-13 ENCOUNTER — Telehealth: Payer: Self-pay | Admitting: Cardiovascular Disease

## 2016-04-13 ENCOUNTER — Other Ambulatory Visit: Payer: Self-pay | Admitting: Otolaryngology

## 2016-04-13 DIAGNOSIS — H9122 Sudden idiopathic hearing loss, left ear: Secondary | ICD-10-CM

## 2016-04-13 NOTE — Telephone Encounter (Signed)
New message       The office was needing the last office note, and cardiology related,  Echo, stress test and EKG.  Please fax to [facility] [place] at [phone]

## 2016-04-20 ENCOUNTER — Ambulatory Visit
Admission: RE | Admit: 2016-04-20 | Discharge: 2016-04-20 | Disposition: A | Discharge status: Home / Self Care | Payer: BC Managed Care – PPO | Source: Ambulatory Visit | Attending: Otolaryngology | Admitting: Otolaryngology

## 2016-04-20 ENCOUNTER — Other Ambulatory Visit: Payer: BC Managed Care – PPO

## 2016-04-20 DIAGNOSIS — H9122 Sudden idiopathic hearing loss, left ear: Secondary | ICD-10-CM

## 2016-04-20 MED ORDER — GADOBENATE DIMEGLUMINE 529 MG/ML IV SOLN
16.0000 mL | Freq: Once | INTRAVENOUS | Status: AC | PRN
  Administered 2016-04-20: 16 mL via INTRAVENOUS

## 2016-04-21 ENCOUNTER — Other Ambulatory Visit: Payer: BC Managed Care – PPO

## 2016-04-29 ENCOUNTER — Other Ambulatory Visit: Payer: BC Managed Care – PPO

## 2016-05-04 ENCOUNTER — Emergency Department (HOSPITAL_COMMUNITY)
Admission: EM | Admit: 2016-05-04 | Discharge: 2016-05-04 | Disposition: A | Discharge status: Home / Self Care | Payer: BC Managed Care – PPO | Attending: Emergency Medicine | Admitting: Emergency Medicine

## 2016-05-04 ENCOUNTER — Encounter (HOSPITAL_COMMUNITY): Payer: Self-pay

## 2016-05-04 DIAGNOSIS — Z8673 Personal history of transient ischemic attack (TIA), and cerebral infarction without residual deficits: Secondary | ICD-10-CM | POA: Insufficient documentation

## 2016-05-04 DIAGNOSIS — Z7982 Long term (current) use of aspirin: Secondary | ICD-10-CM | POA: Diagnosis not present

## 2016-05-04 DIAGNOSIS — Z79899 Other long term (current) drug therapy: Secondary | ICD-10-CM | POA: Diagnosis not present

## 2016-05-04 DIAGNOSIS — R51 Headache: Secondary | ICD-10-CM | POA: Insufficient documentation

## 2016-05-04 DIAGNOSIS — E213 Hyperparathyroidism, unspecified: Secondary | ICD-10-CM | POA: Insufficient documentation

## 2016-05-04 DIAGNOSIS — Z853 Personal history of malignant neoplasm of breast: Secondary | ICD-10-CM | POA: Insufficient documentation

## 2016-05-04 DIAGNOSIS — R519 Headache, unspecified: Secondary | ICD-10-CM

## 2016-05-04 DIAGNOSIS — I10 Essential (primary) hypertension: Secondary | ICD-10-CM | POA: Diagnosis not present

## 2016-05-04 DIAGNOSIS — E785 Hyperlipidemia, unspecified: Secondary | ICD-10-CM | POA: Diagnosis not present

## 2016-05-04 LAB — CBC WITH DIFFERENTIAL/PLATELET
BASOS ABS: 0 10*3/uL (ref 0.0–0.1)
BASOS PCT: 0 %
EOS ABS: 0.1 10*3/uL (ref 0.0–0.7)
EOS PCT: 2 %
HCT: 36.6 % (ref 36.0–46.0)
Hemoglobin: 13 g/dL (ref 12.0–15.0)
LYMPHS PCT: 17 %
Lymphs Abs: 1.1 10*3/uL (ref 0.7–4.0)
MCH: 31.3 pg (ref 26.0–34.0)
MCHC: 35.5 g/dL (ref 30.0–36.0)
MCV: 88 fL (ref 78.0–100.0)
Monocytes Absolute: 0.6 10*3/uL (ref 0.1–1.0)
Monocytes Relative: 9 %
Neutro Abs: 4.8 10*3/uL (ref 1.7–7.7)
Neutrophils Relative %: 72 %
PLATELETS: 289 10*3/uL (ref 150–400)
RBC: 4.16 MIL/uL (ref 3.87–5.11)
RDW: 13.8 % (ref 11.5–15.5)
WBC: 6.7 10*3/uL (ref 4.0–10.5)

## 2016-05-04 LAB — I-STAT CHEM 8, ED
BUN: 14 mg/dL (ref 6–20)
CHLORIDE: 101 mmol/L (ref 101–111)
Calcium, Ion: 1.3 mmol/L — ABNORMAL HIGH (ref 1.12–1.23)
Creatinine, Ser: 0.9 mg/dL (ref 0.44–1.00)
Glucose, Bld: 106 mg/dL — ABNORMAL HIGH (ref 65–99)
HCT: 37 % (ref 36.0–46.0)
HEMOGLOBIN: 12.6 g/dL (ref 12.0–15.0)
POTASSIUM: 3.9 mmol/L (ref 3.5–5.1)
SODIUM: 135 mmol/L (ref 135–145)
TCO2: 25 mmol/L (ref 0–100)

## 2016-05-04 NOTE — ED Notes (Signed)
Pt complains of severe pain in both her jaws and the top of her head to the center of her back

## 2016-05-04 NOTE — Discharge Instructions (Signed)

## 2016-05-04 NOTE — ED Notes (Signed)
Pt reports h/a radiating down to bila jaw, L arm and mid back since early this am while getting ready for work.  Pt has hx of CHF.  Pt reports SOB with exertion.  Pt in NAD.  Pt is A&Ox4.  No obvious neuro deficits noted at this time.

## 2016-05-04 NOTE — ED Notes (Signed)
Pt ambulated to the BR with family assisting.

## 2016-05-04 NOTE — ED Provider Notes (Signed)
CSN: [id number]     Arrival date & time 05/04/16  0701 History   First MD Initiated Contact with Patient 05/04/16 [phone]     Chief Complaint  Patient presents with  . Headache      HPI Patient presented with headache no into her back. She states she's had this before. States it started on the top of her head and down her neck into her jaw and to her back. She's had episodes of this before. States he gets so bad she cannot move or speak. She states she had it a couple years ago and had an extensive workup. It began this morning. Last around 15 minutes this time and has since resolved. States she is being treated for ringing in her left ear. She is chronically deaf right ear. She's had steroids injections in her ear. She has her left ear with cotton in it and taped over. States she needs this because it is too loud where she works. She has a history of heart failure and is on a water pill. No abdominal pain. States there is no localizing numbness or weakness. States she has had recent MRIs of her head due to the ringing in her ears.   Past Medical History  Diagnosis Date  . Breast cancer ([place])   . Hemorrhoids   . Hiatal hernia 2014    large  . Pulmonary nodules   . Hyperparathyroidism ([place])   . Hypertension   . Hyperlipidemia   . Heart disease   . Allergy   . Anxiety   . TIA (transient ischemic attack)   . Cataract     early signs of  . Osteoporosis   . Parathyroid disorder [facility])    Past Surgical History  Procedure Laterality Date  . Mastectomy Right   . Bladder suspension    . Cesarean section      x 2  . Tubal ligation    . Cholecystectomy    . Total elbow replacement    . Parathyroidectomy     Family History  Problem Relation Age of Onset  . Breast cancer Mother   . Hypertension    . Heart disease    . Colon cancer Neg Hx   . Esophageal cancer Neg Hx   . Stomach cancer Neg Hx   . Rectal cancer Neg Hx   . Sudden death Father   . Heart failure Father    Social  History  Substance Use Topics  . Smoking status: Never Smoker   . Smokeless tobacco: Never Used  . Alcohol Use: No     Comment: On Holidays   OB History    No data available     Review of Systems  Constitutional: Negative for appetite change.  Eyes: Negative for visual disturbance.  Respiratory: Negative for shortness of breath.   Cardiovascular: Negative for leg swelling.  Gastrointestinal: Negative for abdominal pain.  Genitourinary: Negative for flank pain.  Musculoskeletal: Positive for back pain.  Skin: Negative for wound.  Neurological: Positive for headaches.  Hematological: Negative for adenopathy.  Psychiatric/Behavioral: Negative for behavioral problems.      Allergies  Anesthetics, ester; Codeine; Tape; Amlodipine; Hydrochlorothiazide; and Latex  Home Medications   Prior to Admission medications   Medication Sig Start Date End Date Taking? Authorizing Provider  acetaminophen (TYLENOL) 500 MG tablet Take 1,000 mg by mouth every 6 (six) hours as needed for headache.   Yes Historical Provider, MD  amitriptyline (ELAVIL) 10 MG tablet Take 10 mg  by mouth at bedtime.   Yes Historical Provider, MD  aspirin EC 81 MG tablet Take 81 mg by mouth daily.   Yes Historical Provider, MD  atorvastatin (LIPITOR) 10 MG tablet Take 10 mg by mouth every evening.   Yes Historical Provider, MD  Cholecalciferol (VITAMIN D3) 2000 units TABS Take 1 tablet by mouth daily.   Yes Historical Provider, MD  CINNAMON PO Take 1 tablet by mouth daily.   Yes Historical Provider, MD  Coconut Oil 1000 MG CAPS Take 1 capsule by mouth daily.   Yes Historical Provider, MD  Coenzyme Q10 (CO Q 10 PO) Take 1 tablet by mouth daily.   Yes Historical Provider, MD  furosemide (LASIX) 40 MG tablet Take 1 tablet (40 mg total) by mouth daily. 02/19/16  Yes Mihai Croitoru, MD  hydrALAZINE (APRESOLINE) 50 MG tablet Take 1 tablet by mouth 2 (two) times daily. 12/15/15  Yes Historical Provider, MD  magnesium oxide  (MAG-OX) 400 MG tablet Take 400 mg by mouth daily.   Yes Historical Provider, MD  nebivolol (BYSTOLIC) 10 MG tablet Take 10 mg by mouth daily.   Yes Historical Provider, MD  omeprazole (PRILOSEC OTC) 20 MG tablet Take 20 mg by mouth 2 (two) times daily.   Yes Historical Provider, MD  OVER THE COUNTER MEDICATION Tumeric Black pepper 5mg  tab take 1 tab by mouth daily   Yes Historical Provider, MD  Probiotic Product (ALIGN) 4 MG CAPS Take 1 capsule by mouth 2 (two) times daily.    Yes Historical Provider, MD  valsartan (DIOVAN) 320 MG tablet Take 320 mg by mouth daily.   Yes Historical Provider, MD   BP 101/74 mmHg  Pulse 66  Temp(Src) 98.4 F (36.9 C) (Oral)  Resp 16  SpO2 96% Physical Exam  Constitutional: She appears well-developed.  HENT:  Head: Atraumatic.  Left ear with cotton taped into it.  Cardiovascular: Normal rate.   Pulmonary/Chest: Effort normal.  Abdominal: Soft.  Musculoskeletal: Normal range of motion.  Neurological: She is alert.  Skin: Skin is warm.    ED Course  Procedures (including critical care time) Labs Review Labs Reviewed  I-STAT CHEM 8, ED - Abnormal; Notable for the following:    Glucose, Bld 106 (*)    Calcium, Ion 1.30 (*)    All other components within normal limits  CBC WITH DIFFERENTIAL/PLATELET    Imaging Review No results found. I have personally reviewed and evaluated these images and lab results as part of my medical decision-making.   EKG Interpretation   Date/Time:  Wednesday May 04 2016 07:47:42 EDT Ventricular Rate:  79 PR Interval:    QRS Duration: 88 QT Interval:  362 QTC Calculation: 415 R Axis:   45 Text Interpretation:  Sinus rhythm Baseline wander in lead(s) I II aVR aVF  No significant change since last tracing Confirmed by Alvino Chapel  MD,  Ovid Curd [phone]) on 05/04/2016 8:01:48 AM Also confirmed by Alvino Chapel  MD,  Zayde Stroupe [phone]), editor [place] [facility], Leda Gauze [phone])  on 05/04/2016 9:58:48 AM      MDM   Final diagnoses:   Nonintractable headache, unspecified chronicity pattern, unspecified headache type    Patient with headache. Also pain down the neck. Workup extensively in past. Recent and fluid pills. Labs reassuring. Has had previous heart cath, [facility], MRIs. No clear cause found but thought to be maybe neuropathic. Will discharge home.    Davonna Belling, MD 05/04/16 [phone]

## 2016-05-12 ENCOUNTER — Telehealth: Payer: Self-pay | Admitting: Cardiovascular Disease

## 2016-05-12 NOTE — Telephone Encounter (Signed)
Patient brought in FMLA forms for Dr Sallyanne Kuster to review, complete and sign.  Forms given to Dr Sallyanne Kuster on 05/12/16. lp

## 2016-05-13 ENCOUNTER — Telehealth: Payer: Self-pay | Admitting: Cardiovascular Disease

## 2016-05-13 NOTE — Telephone Encounter (Signed)
Received signed FMLA forms back from Dr Sallyanne Kuster 05/12/16.  Notified patient [facility]) and faxed to [facility] 05/12/16. lp

## 2016-05-17 ENCOUNTER — Telehealth: Payer: Self-pay | Admitting: Cardiovascular Disease

## 2016-05-17 NOTE — Telephone Encounter (Signed)
[employer] called regarding signed FMLA forms. Asked that duration/frequency be completed.  Forms were corrected and refaxed to [facility]. 05/13/16

## 2016-06-15 ENCOUNTER — Ambulatory Visit (INDEPENDENT_AMBULATORY_CARE_PROVIDER_SITE_OTHER): Payer: BC Managed Care – PPO | Admitting: Cardiovascular Disease

## 2016-06-15 ENCOUNTER — Encounter: Payer: Self-pay | Admitting: Cardiovascular Disease

## 2016-06-15 VITALS — BP 116/68 | HR 83 | Ht 61.0 in | Wt 175.0 lb

## 2016-06-15 DIAGNOSIS — E785 Hyperlipidemia, unspecified: Secondary | ICD-10-CM | POA: Diagnosis not present

## 2016-06-15 DIAGNOSIS — I5032 Chronic diastolic (congestive) heart failure: Secondary | ICD-10-CM

## 2016-06-15 DIAGNOSIS — Z9221 Personal history of antineoplastic chemotherapy: Secondary | ICD-10-CM | POA: Diagnosis not present

## 2016-06-15 DIAGNOSIS — I10 Essential (primary) hypertension: Secondary | ICD-10-CM

## 2016-06-15 NOTE — Progress Notes (Signed)
Patient ID: Sarah Cook, female   DOB: September 04, 1951, 66 y.o.   MRN: [id number] Patient ID: Sarah Cook, female   DOB: 11-Feb-1951, 64 y.o.   MRN: [id number]    Cardiology Office Note    Date:  06/16/2016   Sarah Cook, Sarah Cook 1951-10-03, MRN [id number]  PCP:  Cari Caraway, MD  Cardiologist:   Sanda Klein, MD   Chief Complaint  Patient presents with  . Follow-up    3 MONTHS    History of Present Illness:  Sarah Cook is a 65 y.o. 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 Her hearing is bothering her more than her dyspnea. She is able to climb one flight of stairs without stopping to catch her breath, but feels it should be exhausted if she tried to climb two at a time. She believes the diuretics have helped. Her weight does tend to oscillate fairly broadly and she has edema and shortness of breath when her weight is higher.  Her family history is significant for congestive heart failure and sudden cardiac death in her father who died at age 19. She has no history of diabetes or smoking. She takes a statin for hyperlipidemia and has usually well-controlled hypertension. Her blood pressure is slightly elevated today, and this has been a bit of a problem recently. She denies a history of palpitations, syncope, leg edema, claudication, other focal neurological complaints. She has a remote history of Bell's palsy without residual sequelae. There is a history of previous transient ischemic attack, poorly remembered and poorly defined.  In May 2012 show underwent cardiac catheterization, due to complaints of persisting shortness of breath and exertional chest pain. That study showed no evidence of any significant coronary artery disease (not even mention of nonobstructive disease) and showed normal left ventricular systolic function. Left ventricular  end-diastolic pressure was mildly increased at 19 mmHg. There was no evidence of aortic valve stenosis. CT angiography of the chest performed to exclude pulmonary embolism in January 2015 showed "age advanced coronary artery atherosclerosis". Echo in 2017 showed normal left ventricular systolic function, normal regional wall motion, diastolic dysfunction with elevated left ventricular filling pressures with markedly elevated E/e'ratios. Also in 2017 she had a normal stress test with moderate exercise ability (almost 7 minutes on the Bruce protocol).  She had a remote mastectomy for right breast cancer treated with chemotherapy that included Adriamycin (2000). She did not receive chest radiation therapy.   Past Medical History:  Diagnosis Date  . Allergy   . Anxiety   . Breast cancer ([place])   . Cataract    early signs of  . Heart disease   . Hemorrhoids   . Hiatal hernia 2014   large  . Hyperlipidemia   . Hyperparathyroidism ([place])   . Hypertension   . Osteoporosis   . Parathyroid disorder ([place])   . Pulmonary nodules   . TIA (transient ischemic attack)     Past Surgical History:  Procedure Laterality Date  . BLADDER SUSPENSION    . CESAREAN SECTION     x 2  . CHOLECYSTECTOMY    . MASTECTOMY Right   . PARATHYROIDECTOMY    . TOTAL ELBOW REPLACEMENT    . TUBAL LIGATION      Current Medications: Outpatient Medications Prior to Visit  Medication Sig Dispense Refill  . acetaminophen (TYLENOL) 500 MG tablet Take  1,000 mg by mouth every 6 (six) hours as needed for headache.    Marland Kitchen amitriptyline (ELAVIL) 10 MG tablet Take 10 mg by mouth at bedtime.    Marland Kitchen aspirin EC 81 MG tablet Take 81 mg by mouth daily.    Marland Kitchen atorvastatin (LIPITOR) 10 MG tablet Take 10 mg by mouth every evening.    . Cholecalciferol (VITAMIN D3) 2000 units TABS Take 1 tablet by mouth daily.    Marland Kitchen CINNAMON PO Take 1 tablet by mouth daily.    . Coconut Oil 1000 MG CAPS Take 1 capsule by mouth daily.    . Coenzyme Q10  (CO Q 10 PO) Take 1 tablet by mouth daily.    . furosemide (LASIX) 40 MG tablet Take 1 tablet (40 mg total) by mouth daily. 90 tablet 3  . hydrALAZINE (APRESOLINE) 50 MG tablet Take 1 tablet by mouth 2 (two) times daily.  1  . magnesium oxide (MAG-OX) 400 MG tablet Take 400 mg by mouth daily.    . nebivolol (BYSTOLIC) 10 MG tablet Take 10 mg by mouth daily.    Marland Kitchen omeprazole (PRILOSEC OTC) 20 MG tablet Take 20 mg by mouth 2 (two) times daily.    Marland Kitchen OVER THE COUNTER MEDICATION Tumeric Black pepper 5mg  tab take 1 tab by mouth daily    . Probiotic Product (ALIGN) 4 MG CAPS Take 1 capsule by mouth 2 (two) times daily.     . valsartan (DIOVAN) 320 MG tablet Take 320 mg by mouth daily.     No facility-administered medications prior to visit.      Allergies:   Anesthetics, ester; Codeine; Tape; Amlodipine; Hydrochlorothiazide; and Latex   Social History   Social History  . Marital status: Married    Spouse name: Ollen Gross  . Number of children: 3  . Years of education: college   Occupational History  . 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 Smoking status: Never Smoker  . Smokeless tobacco: Never Used  . Alcohol use No     Comment: On Holidays  . Drug use: No  . Sexual activity: Not Currently   Other Topics Concern  . None   Social History Narrative   Patient is married Ollen Gross) and lives at home with her husband.   Patient works at [employer].   Patient is right-handed.   Education college   Caffeine one cup daily                 Family History:  The patient's family history includes Breast cancer in her mother; Heart failure in her father; Sudden death in her father. Father had sudden death at age 59 and reportedly had congestive heart failure  ROS:   Please see the history of present illness.    ROS All other systems reviewed and are negative.   PHYSICAL EXAM:   VS:  BP 116/68   Pulse 83   Ht 5\' 1"  (1.549 m)   Wt 175 lb (79.4 kg)   BMI 33.07  kg/m    GEN: Well nourished, well developed, in no acute distress  HEENT: normal  Neck: no JVD, carotid bruits, or masses Cardiac: RRR; no murmurs, rubs, or gallops,no edema  Respiratory:  clear to auscultation bilaterally, normal work of breathing GI: soft, nontender, nondistended, + BS MS: no deformity or atrophy  Skin: warm and dry, no rash Neuro:  Alert and Oriented x 3, Strength and sensation are intact Psych: euthymic mood, full affect  Wt Readings from Last 3 Encounters:  06/15/16 175 lb (79.4 kg)  03/09/16 170 lb 9.6 oz (77.4 kg)  02/03/16 178 lb (80.7 kg)      Studies/Labs Reviewed:   EKG:  EKG is not ordered today.  Recent Labs: 01/27/2016: ALT 23; TSH 2.338 05/04/2016: BUN 14; Creatinine, Ser 0.90; Hemoglobin 12.6; Platelets 289; Potassium 3.9; Sodium 135    ASSESSMENT:    1. 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[place])   2. Essential hypertension   3. Hyperlipidemia   4. History of antineoplastic chemotherapy with cardiotoxic drugs      PLAN:  In order of problems listed above:  1. Diastolic HF: The echo showed normal LV function and there were no ischemic changes on stress testing. Continue treatment diuretics and instructed her how to decide on using a higher furosemide dose if her weight exceeds her usual weight and she feels more breathless. His custom fact that this will involve weighing herself on a daily basis and keeping a record of her weight so we can identify a target "dry weight". 2. Hyperlipidemia: Recent labs still not available. She is on a relatively low-dose statin dose. Coronary artery calcification was noted on CT. Treat to a target LDL of less than 100, preferably less than 70. 3. Hypertension: well-controlled. 4. History of Adriamycin chemotherapy, without evidence of cardiomyopathy   Medication Adjustments/Labs and Tests Ordered: Current medicines are reviewed at length with the patient today.  Concerns regarding medicines are  outlined above.  Medication changes, Labs and Tests ordered today are listed in the Patient Instructions below. Patient Instructions  Dr Sallyanne Kuster has recommended making the following medication changes: 1. You may increase furosemide to 80 mg daily for increased fluid/edema  Dr C recommends that you schedule a follow-up appointment in 3 months.  If you need a refill on your cardiac medications before your next appointment, please call your pharmacy.    Signed, Sanda Klein, MD  06/16/2016 6:20 PM    [facility] [address], [place], [place]  [address] Phone: [phone]; Fax: [phone]

## 2016-06-15 NOTE — Patient Instructions (Signed)
Dr Sallyanne Kuster has recommended making the following medication changes: 1. You may increase furosemide to 80 mg daily for increased fluid/edema  Dr C recommends that you schedule a follow-up appointment in 3 months.  If you need a refill on your cardiac medications before your next appointment, please call your pharmacy.

## 2016-09-14 ENCOUNTER — Encounter: Payer: Self-pay | Admitting: Cardiovascular Disease

## 2016-09-14 ENCOUNTER — Ambulatory Visit (INDEPENDENT_AMBULATORY_CARE_PROVIDER_SITE_OTHER): Payer: BC Managed Care – PPO | Admitting: Cardiovascular Disease

## 2016-09-14 VITALS — BP 123/65 | HR 65 | Ht 61.0 in | Wt 170.0 lb

## 2016-09-14 DIAGNOSIS — I10 Essential (primary) hypertension: Secondary | ICD-10-CM | POA: Diagnosis not present

## 2016-09-14 DIAGNOSIS — I5032 Chronic diastolic (congestive) heart failure: Secondary | ICD-10-CM | POA: Diagnosis not present

## 2016-09-14 DIAGNOSIS — E7800 Pure hypercholesterolemia, unspecified: Secondary | ICD-10-CM

## 2016-09-14 NOTE — Patient Instructions (Signed)
Dr Croitoru recommends that you schedule a follow-up appointment in 1 year. You will receive a reminder letter in the mail two months in advance. If you don't receive a letter, please call our office to schedule the follow-up appointment.  If you need a refill on your cardiac medications before your next appointment, please call your pharmacy. 

## 2016-09-14 NOTE — Progress Notes (Signed)
Patient ID: Sarah Cook, female   DOB: [birth year], 65 y.o.   MRN: [id number] Patient ID: Sarah Cook, female   DOB: Oct 07, 1951, 65 y.o.   MRN: [id number]    Cardiology Office Note    Date:  09/14/2016   ID:  KELISE KOPERA, DOB [birth year], MRN [id number]  PCP:  Cari Caraway, MD  Cardiologist:   Sanda Klein, MD   Chief Complaint  Patient presents with  . Follow-up    pt c/o doe    History of Present Illness:  Sarah Cook is a 65 y.o. female with a history of hypertension, hyperlipidemia and mild diastolic heart failure   She generally feels well, But still feels that she is under stress at work. She is planning to retire in April. Her hearing is bothering her more than her dyspnea. As before, she hears a constant whooshing in her years in all sounds are intensely amplify including the sound of chewing or swallowing.   She is able to climb one flight of stairs without stopping to catch her breath. She generally does not have edema except at the end of a lengthy day, always resolving by the next morning. She believes the diuretics have helped.   Her family history is significant for congestive heart failure and sudden cardiac death in her father who died at age 94. She has no history of diabetes or smoking. She takes a statin for hyperlipidemia and has usually well-controlled hypertension. Her blood pressure is slightly elevated today, and this has been a bit of a problem recently. She denies a history of palpitations, syncope, leg edema, claudication, other focal neurological complaints. She has a remote history of Bell's palsy without residual sequelae. There is a history of previous transient ischemic attack, poorly remembered and poorly defined.  In May 2012 show underwent cardiac catheterization, due to complaints of persisting shortness of breath and exertional chest pain. That study showed no evidence of any significant coronary artery disease (not even mention of nonobstructive  disease) and showed normal left ventricular systolic function. Left ventricular end-diastolic pressure was mildly increased at 19 mmHg. There was no evidence of aortic valve stenosis. CT angiography of the chest performed to exclude pulmonary embolism in January 2015 showed "age advanced coronary artery atherosclerosis". Echo in 2017 showed normal left ventricular systolic function, normal regional wall motion, diastolic dysfunction with elevated left ventricular filling pressures with markedly elevated E/e'ratios. Also in 2017 she had a normal stress test with moderate exercise ability (almost 7 minutes on the Bruce protocol).  She had a remote mastectomy for right breast cancer treated with chemotherapy that included Adriamycin (2000). She did not receive chest radiation therapy.   Past Medical History:  Diagnosis Date  . Allergy   . Anxiety   . Breast cancer ([place])   . Cataract    early signs of  . Heart disease   . Hemorrhoids   . Hiatal hernia 2014   large  . Hyperlipidemia   . Hyperparathyroidism ([place])   . Hypertension   . Osteoporosis   . Parathyroid disorder ([place])   . Pulmonary nodules   . TIA (transient ischemic attack)     Past Surgical History:  Procedure Laterality Date  . BLADDER SUSPENSION    . CESAREAN SECTION     x 2  . CHOLECYSTECTOMY    . MASTECTOMY Right   . PARATHYROIDECTOMY    . TOTAL ELBOW REPLACEMENT    . TUBAL LIGATION      Current Medications:  Outpatient Medications Prior to Visit  Medication Sig Dispense Refill  . acetaminophen (TYLENOL) 500 MG tablet Take 1,000 mg by mouth every 6 (six) hours as needed for headache.    Marland Kitchen amitriptyline (ELAVIL) 10 MG tablet Take 10 mg by mouth at bedtime.    Marland Kitchen atorvastatin (LIPITOR) 10 MG tablet Take 10 mg by mouth every evening.    . Cholecalciferol (VITAMIN D3) 2000 units TABS Take 1 tablet by mouth daily.    Marland Kitchen CINNAMON PO Take 1 tablet by mouth daily.    . Coconut Oil 1000 MG CAPS Take 1 capsule by mouth daily.     . Coenzyme Q10 (CO Q 10 PO) Take 1 tablet by mouth daily.    . furosemide (LASIX) 40 MG tablet Take 1 tablet (40 mg total) by mouth daily. 90 tablet 3  . hydrALAZINE (APRESOLINE) 50 MG tablet Take 1 tablet by mouth 2 (two) times daily.  1  . magnesium oxide (MAG-OX) 400 MG tablet Take 400 mg by mouth daily.    . nebivolol (BYSTOLIC) 10 MG tablet Take 10 mg by mouth daily.    Marland Kitchen omeprazole (PRILOSEC OTC) 20 MG tablet Take 20 mg by mouth daily.     Marland Kitchen OVER THE COUNTER MEDICATION Tumeric Black pepper 5mg  tab take 1 tab by mouth daily    . Probiotic Product (ALIGN) 4 MG CAPS Take 1 capsule by mouth daily.     . valsartan (DIOVAN) 320 MG tablet Take 320 mg by mouth daily.    Marland Kitchen aspirin EC 81 MG tablet Take 81 mg by mouth daily.     No facility-administered medications prior to visit.      Allergies:   Anesthetics, ester; Codeine; Tape; Amlodipine; Hydrochlorothiazide; and Latex   Social History   Social History  . Marital status: Married    Spouse name: Ollen Gross  . Number of children: 3  . Years of education: college   Occupational History  . 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 Smoking status: Never Smoker  . Smokeless tobacco: Never Used  . Alcohol use No     Comment: On Holidays  . Drug use: No  . Sexual activity: Not Currently   Other Topics Concern  . Not on file   Social History Narrative   Patient is married Ollen Gross) and lives at home with her husband.   Patient works at [employer].   Patient is right-handed.   Education college   Caffeine one cup daily                 Family History:  The patient's family history includes Breast cancer in her mother; Heart failure in her father; Sudden death in her father. Father had sudden death at age 22 and reportedly had congestive heart failure  ROS:   Please see the history of present illness.    ROS All other systems reviewed and are negative.   PHYSICAL EXAM:   VS:  BP 123/65 (BP Location:  Right Arm, Patient Position: Sitting, Cuff Size: Normal)   Pulse 65   Ht 5\' 1"  (1.549 m)   Wt 170 lb (77.1 kg)   SpO2 96%   BMI 32.12 kg/m    GEN: Well nourished, well developed, in no acute distress  HEENT: normal  Neck: no JVD, carotid bruits, or masses Cardiac: RRR; no murmurs, rubs, or gallops,no edema  Respiratory:  clear to auscultation bilaterally, normal work of breathing GI: soft, nontender, nondistended, + BS  MS: no deformity or atrophy  Skin: warm and dry, no rash Neuro:  Alert and Oriented x 3, Strength and sensation are intact Psych: euthymic mood, full affect  Wt Readings from Last 3 Encounters:  09/14/16 170 lb (77.1 kg)  06/15/16 175 lb (79.4 kg)  03/09/16 170 lb 9.6 oz (77.4 kg)      Studies/Labs Reviewed:   EKG:  EKG is not ordered today.  Recent Labs: 01/27/2016: ALT 23; TSH 2.338 05/04/2016: BUN 14; Creatinine, Ser 0.90; Hemoglobin 12.6; Platelets 289; Potassium 3.9; Sodium 135  March 2017 total cholesterol 175, HDL 56, LDL 93, triglycerides 129, A1c 5.2,   ASSESSMENT:    1. 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[facility])   2. Pure hypercholesterolemia   3. Essential hypertension      PLAN:  In order of problems listed above:  1. Diastolic HF: Continue treatment with sodium restriction and diuretics. Reviewed daily weight monitoring, sodium restriction and diuretic dose adjustment based on weight and symptoms. As far as I can tell clinically she is currently at "dry weight". 2. Hyperlipidemia: Coronary artery calcification was noted on CT. Treat to a target LDL of less than 100, preferably less than 70. Labs from earlier this year at target range. 3. Hypertension: well-controlled.    Medication Adjustments/Labs and Tests Ordered: Current medicines are reviewed at length with the patient today.  Concerns regarding medicines are outlined above.  Medication changes, Labs and Tests ordered today are listed in the Patient Instructions  below. Patient Instructions  Dr Sallyanne Kuster recommends that you schedule a follow-up appointment in 1 year. You will receive a reminder letter in the mail two months in advance. If you don't receive a letter, please call our office to schedule the follow-up appointment.  If you need a refill on your cardiac medications before your next appointment, please call your pharmacy.    Signed, Sanda Klein, MD  09/14/2016 3:47 PM    [facility] [place], [place], [place]  [address] Phone: [phone]; Fax: [phone]

## 2017-02-07 ENCOUNTER — Other Ambulatory Visit: Payer: Self-pay | Admitting: Cardiovascular Disease

## 2017-07-01 ENCOUNTER — Encounter (HOSPITAL_COMMUNITY): Payer: Self-pay

## 2017-07-01 ENCOUNTER — Emergency Department (HOSPITAL_COMMUNITY): Payer: BLUE CROSS/BLUE SHIELD

## 2017-07-01 ENCOUNTER — Emergency Department (HOSPITAL_COMMUNITY)
Admission: EM | Admit: 2017-07-01 | Discharge: 2017-07-02 | Disposition: A | Discharge status: Home / Self Care | Payer: BLUE CROSS/BLUE SHIELD | Attending: Emergency Medicine | Admitting: Emergency Medicine

## 2017-07-01 DIAGNOSIS — Y998 Other external cause status: Secondary | ICD-10-CM | POA: Insufficient documentation

## 2017-07-01 DIAGNOSIS — E213 Hyperparathyroidism, unspecified: Secondary | ICD-10-CM | POA: Insufficient documentation

## 2017-07-01 DIAGNOSIS — Z79899 Other long term (current) drug therapy: Secondary | ICD-10-CM | POA: Insufficient documentation

## 2017-07-01 DIAGNOSIS — Z8673 Personal history of transient ischemic attack (TIA), and cerebral infarction without residual deficits: Secondary | ICD-10-CM | POA: Diagnosis not present

## 2017-07-01 DIAGNOSIS — Z91040 Latex allergy status: Secondary | ICD-10-CM | POA: Diagnosis not present

## 2017-07-01 DIAGNOSIS — Z9049 Acquired absence of other specified parts of digestive tract: Secondary | ICD-10-CM | POA: Diagnosis not present

## 2017-07-01 DIAGNOSIS — M25511 Pain in right shoulder: Secondary | ICD-10-CM | POA: Insufficient documentation

## 2017-07-01 DIAGNOSIS — Z853 Personal history of malignant neoplasm of breast: Secondary | ICD-10-CM | POA: Diagnosis not present

## 2017-07-01 DIAGNOSIS — Y9389 Activity, other specified: Secondary | ICD-10-CM | POA: Insufficient documentation

## 2017-07-01 DIAGNOSIS — W11XXXA Fall on and from ladder, initial encounter: Secondary | ICD-10-CM | POA: Diagnosis not present

## 2017-07-01 DIAGNOSIS — S8991XA Unspecified injury of right lower leg, initial encounter: Secondary | ICD-10-CM | POA: Diagnosis present

## 2017-07-01 DIAGNOSIS — I10 Essential (primary) hypertension: Secondary | ICD-10-CM | POA: Insufficient documentation

## 2017-07-01 DIAGNOSIS — F419 Anxiety disorder, unspecified: Secondary | ICD-10-CM | POA: Diagnosis not present

## 2017-07-01 DIAGNOSIS — S82144A Nondisplaced bicondylar fracture of right tibia, initial encounter for closed fracture: Secondary | ICD-10-CM | POA: Diagnosis not present

## 2017-07-01 DIAGNOSIS — Y929 Unspecified place or not applicable: Secondary | ICD-10-CM | POA: Insufficient documentation

## 2017-07-01 DIAGNOSIS — W19XXXA Unspecified fall, initial encounter: Secondary | ICD-10-CM

## 2017-07-01 DIAGNOSIS — S82141A Displaced bicondylar fracture of right tibia, initial encounter for closed fracture: Secondary | ICD-10-CM

## 2017-07-01 MED ORDER — MORPHINE SULFATE (PF) 4 MG/ML IV SOLN
2.0000 mg | Freq: Once | INTRAVENOUS | Status: AC
  Administered 2017-07-01: 2 mg via INTRAVENOUS
  Filled 2017-07-01: qty 1

## 2017-07-01 MED ORDER — OXYCODONE-ACETAMINOPHEN 5-325 MG PO TABS: 1.0000 | ORAL_TABLET | ORAL | 0 refills | Status: DC | PRN

## 2017-07-01 MED ORDER — MORPHINE SULFATE (PF) 4 MG/ML IV SOLN
4.0000 mg | Freq: Once | INTRAVENOUS | Status: AC
  Administered 2017-07-01: 4 mg via INTRAVENOUS
  Filled 2017-07-01: qty 1

## 2017-07-01 MED ORDER — OXYCODONE-ACETAMINOPHEN 5-325 MG PO TABS
2.0000 | ORAL_TABLET | Freq: Once | ORAL | Status: AC
  Administered 2017-07-01: 2 via ORAL
  Filled 2017-07-01: qty 2

## 2017-07-01 NOTE — ED Provider Notes (Signed)
[facility] DEPT Provider Note   CSN: [id number] Arrival date & time: 07/01/17  1605     History   Chief Complaint Chief Complaint  Patient presents with  . Fall  . Knee Pain    HPI Sarah Cook is a 66 y.o. female.  The history is provided by the patient. No language interpreter was used.  Fall  This is a new problem. The current episode started 1 to 2 hours ago. The problem occurs constantly. The problem has been gradually worsening. Nothing aggravates the symptoms. Nothing relieves the symptoms. She has tried nothing for the symptoms. The treatment provided no relief.  Knee Pain   This is a new problem. The current episode started 2 days ago. The problem occurs constantly. The problem has not changed since onset.The pain is present in the right lower leg. The pain is moderate. Associated symptoms include limited range of motion. She has tried nothing for the symptoms. The treatment provided moderate relief. There has been no history of extremity trauma.   Pt reports she fell off of a step ladder.  Pt complains of pain on her right side.  Pt has pain in her right shoulder and her right knee.   Past Medical History:  Diagnosis Date  . Allergy   . Anxiety   . Breast cancer ([place])   . Cataract    early signs of  . Heart disease   . Hemorrhoids   . Hiatal hernia 2014   large  . Hyperlipidemia   . Hyperparathyroidism ([place])   . Hypertension   . Osteoporosis   . Parathyroid disorder ([place])   . Pulmonary nodules   . TIA (transient ischemic attack)     Patient Active Problem List   Diagnosis Date Noted  . 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[place]) 03/09/2016  . Chest pain 03/05/16  . History of antineoplastic chemotherapy with cardiotoxic drugs March 05, 2016  . Family history of sudden cardiac death in father 05-Mar-2016  . Breast cancer ([place])   . Hemorrhoids   . Hiatal hernia   . Pulmonary nodules   . Hyperparathyroidism ([place])   . Hypertension   .  Hyperlipidemia   . Heart disease   . Allergy   . Anxiety   . TIA (transient ischemic attack)   . Cataract   . Osteoporosis   . Parathyroid disorder ([place])   . Irritable bowel syndrome 12/11/2013  . Headache(784.0) 12/10/2013    Past Surgical History:  Procedure Laterality Date  . BLADDER SUSPENSION    . CESAREAN SECTION     x 2  . CHOLECYSTECTOMY    . MASTECTOMY Right   . PARATHYROIDECTOMY    . TOTAL ELBOW REPLACEMENT    . TUBAL LIGATION      OB History    No data available       Home Medications    Prior to Admission medications   Medication Sig Start Date End Date Taking? Authorizing Provider  acetaminophen (TYLENOL) 650 MG CR tablet Take 650 mg by mouth every 8 (eight) hours.   Yes [provider]  amitriptyline (ELAVIL) 10 MG tablet Take 10 mg by mouth at bedtime.   Yes [provider]  Cholecalciferol (VITAMIN D3) 2000 units TABS Take 1 tablet by mouth daily.   Yes [provider]  CINNAMON PO Take 1 tablet by mouth daily.   Yes [provider]  Coenzyme Q10 (CO Q 10 PO) Take 1 tablet by mouth daily.   Yes [provider]  diclofenac sodium (VOLTAREN) 1 % GEL Apply 1 application topically daily as needed. 06/16/17  Yes [provider]  furosemide (LASIX) 40 MG tablet TAKE 1 TABLET(40 MG) BY MOUTH DAILY 02/07/17  Yes Croitoru, Mihai, MD  hydrALAZINE (APRESOLINE) 50 MG tablet Take 50 mg by mouth 2 (two) times daily.  12/15/15  Yes [provider]  irbesartan (AVAPRO) 300 MG tablet Take 300 mg by mouth daily. 06/12/17  Yes [provider]  magnesium oxide (MAG-OX) 400 MG tablet Take 400 mg by mouth daily.   Yes [provider]  nebivolol (BYSTOLIC) 10 MG tablet Take 10 mg by mouth daily.   Yes [provider]  OVER THE COUNTER MEDICATION Take 2 capsules by mouth at bedtime. Tumeric Black pepper 5mg  tab take 1 tab by mouth daily   Yes [provider]  predniSONE (DELTASONE) 20 MG  tablet Take 20 mg by mouth daily. 06/15/17  Yes [provider]  Probiotic Product (PROBIOTIC PO) Take 1 capsule by mouth daily. Takes a power   Yes [provider]  oxyCODONE-acetaminophen (PERCOCET/ROXICET) 5-325 MG tablet Take 1-2 tablets by mouth every 4 (four) hours as needed for severe pain. 07/01/17   Fransico Meadow, PA-C    Family History Family History  Problem Relation Age of Onset  . Breast cancer Mother   . Sudden death Father   . Heart failure Father   . Hypertension Unknown   . Heart disease Unknown   . Colon cancer Neg Hx   . Esophageal cancer Neg Hx   . Stomach cancer Neg Hx   . Rectal cancer Neg Hx     Social History Social History  Substance Use Topics  . Smoking status: Never Smoker  . Smokeless tobacco: Never Used  . Alcohol use No     Comment: On Holidays     Allergies   Anesthetics, ester; Codeine; Tape; Amlodipine; Hydrochlorothiazide; and Latex   Review of Systems Review of Systems  All other systems reviewed and are negative.    Physical Exam Updated Vital Signs BP (!) 141/70   Pulse 79   Temp 98.4 F (36.9 C) (Oral)   Resp 18   SpO2 100%   Physical Exam  Constitutional: She appears well-developed and well-nourished. No distress.  HENT:  Head: Normocephalic and atraumatic.  Eyes: Conjunctivae are normal.  Neck: Neck supple.  Cardiovascular: Normal rate and regular rhythm.   No murmur heard. Pulmonary/Chest: Effort normal and breath sounds normal. No respiratory distress.  Abdominal: Soft. There is no tenderness.  Musculoskeletal: She exhibits tenderness. She exhibits no edema.  Tender right knee to palpation, swollen  Tender right shoulder,    Neurological: She is alert.  Skin: Skin is warm and dry.  Psychiatric: She has a normal mood and affect.  Nursing note and vitals reviewed.    ED Treatments / Results  Labs (all labs ordered are listed, but only abnormal results are displayed) Labs Reviewed - No data  to display  EKG  EKG Interpretation None       Radiology Dg Shoulder Right  Result Date: 07/01/2017 CLINICAL DATA:  Recent fall from step ladder with shoulder pain, initial encounter EXAM: RIGHT SHOULDER - 2+ VIEW COMPARISON:  02/14/2013 FINDINGS: Degenerative changes of the acromioclavicular joint are seen. No acute fracture or dislocation is seen. The underlying bony thorax is within normal limits. IMPRESSION: No acute abnormality noted. Electronically Signed   By: Inez Catalina M.D.   On: 07/01/2017 17:39  Dg Tibia/fibula Right  Result Date: 07/01/2017 CLINICAL DATA:  Recent fall with knee pain, initial encounter EXAM: RIGHT TIBIA AND FIBULA - 2 VIEW COMPARISON:  None. FINDINGS: Mild irregularity is noted the proximal tibia although incompletely evaluated on this exam. No other focal abnormality is seen. Dedicated knee films are recommended. IMPRESSION: Irregularity in the proximal tibia although incompletely evaluated. Dedicated knee films are recommended. Electronically Signed   By: Inez Catalina M.D.   On: 07/01/2017 17:35   Ct Head Wo Contrast  Result Date: 07/01/2017 CLINICAL DATA:  Fall from step ladder. EXAM: CT HEAD WITHOUT CONTRAST CT CERVICAL SPINE WITHOUT CONTRAST TECHNIQUE: Multidetector CT imaging of the head and cervical spine was performed following the standard protocol without intravenous contrast. Multiplanar CT image reconstructions of the cervical spine were also generated. COMPARISON:  01/27/2016 FINDINGS: CT HEAD FINDINGS Brain: No acute intracranial abnormality. Specifically, no hemorrhage, hydrocephalus, mass lesion, acute infarction, or significant intracranial injury. Vascular: No hyperdense vessel or unexpected calcification. Skull: No acute calvarial abnormality. Sinuses/Orbits: Visualized paranasal sinuses and mastoids clear. Orbital soft tissues unremarkable. Other: None CT CERVICAL SPINE FINDINGS Alignment: Normal Skull base and vertebrae: No fracture. Soft  tissues and spinal canal: Prevertebral soft tissues are normal. No epidural or paraspinal hematoma. Disc levels:  Disc spaces are maintained. Upper chest: Negative Other: Scattered small nodules throughout the thyroid. IMPRESSION: No acute intracranial abnormality. No acute bony abnormality in the cervical spine. Multiple bilateral thyroid nodules. These could be further characterized with elective thyroid ultrasound. Electronically Signed   By: Rolm Baptise M.D.   On: 07/01/2017 17:45   Ct Cervical Spine Wo Contrast  Result Date: 07/01/2017 CLINICAL DATA:  Fall from step ladder. EXAM: CT HEAD WITHOUT CONTRAST CT CERVICAL SPINE WITHOUT CONTRAST TECHNIQUE: Multidetector CT imaging of the head and cervical spine was performed following the standard protocol without intravenous contrast. Multiplanar CT image reconstructions of the cervical spine were also generated. COMPARISON:  01/27/2016 FINDINGS: CT HEAD FINDINGS Brain: No acute intracranial abnormality. Specifically, no hemorrhage, hydrocephalus, mass lesion, acute infarction, or significant intracranial injury. Vascular: No hyperdense vessel or unexpected calcification. Skull: No acute calvarial abnormality. Sinuses/Orbits: Visualized paranasal sinuses and mastoids clear. Orbital soft tissues unremarkable. Other: None CT CERVICAL SPINE FINDINGS Alignment: Normal Skull base and vertebrae: No fracture. Soft tissues and spinal canal: Prevertebral soft tissues are normal. No epidural or paraspinal hematoma. Disc levels:  Disc spaces are maintained. Upper chest: Negative Other: Scattered small nodules throughout the thyroid. IMPRESSION: No acute intracranial abnormality. No acute bony abnormality in the cervical spine. Multiple bilateral thyroid nodules. These could be further characterized with elective thyroid ultrasound. Electronically Signed   By: Rolm Baptise M.D.   On: 07/01/2017 17:45   Ct Knee Right Wo Contrast  Result Date: 07/01/2017 CLINICAL DATA:   Knee pain after fall from ladder twisting the knee this morning. EXAM: CT OF THE  KNEE WITHOUT CONTRAST TECHNIQUE: Multidetector CT imaging of the right knee was performed according to the standard protocol. Multiplanar CT image reconstructions were also generated. COMPARISON:  Radiographs hilar nodes same day FINDINGS: Bones/Joint/Cartilage Acute, comminuted fracture involving the tibial plateau is noted. There is an acute sagittal split fracture through the medial malleolus with an associated 14 x 22 mm in surface area medial tibial plateau fragment with 2.4 mm of subtle depression. The fracture also undermines the tibial spine and there is a coronally oriented fracture that extends along the periphery of the posterior medial and lateral tibial plateau with 2.8 mm of dorsal  displacement noted off the lateral tibial epiphysis. The proximal fibula appears intact. Moderate to large suprapatellar lipohemarthrosis is noted secondary to the fractures. Ligaments Suboptimally assessed by CT. Muscles and Tendons Negative Soft tissues No focal soft tissue hematoma or hemorrhage. IMPRESSION: 1. Acute, comminuted tibial plateau fracture, coronally oriented along the posterior aspect of the medial and lateral tibial plateau with slight posterior displacement up to 2.8 mm involving the lateral tibial plateau with sagittal split fracture of the medial malleolus. The tibial plateau fracture also undermines the tibial spine and would be in keeping with a Schatzker type 4 medial plateau split fracture with slight depression and with involvement of the tibial spines. 2. The depressed medial tibial plateau fracture fragment measures 22 x 14 mm in surface area with 2.4 mm of depression noted. 3. Moderate to large lipohemarthrosis. Electronically Signed   By: Ashley Royalty M.D.   On: 07/01/2017 22:44   Dg Knee Complete 4 Views Right  Result Date: 07/01/2017 CLINICAL DATA:  Fall today with knee pain, initial encounter EXAM: RIGHT KNEE  - COMPLETE 4+ VIEW COMPARISON:  None. FINDINGS: Lucencies and cortical irregularity are noted in the medial tibial plateau consistent with underlying fracture. A joint effusion is seen. IMPRESSION: Minimally displaced medial tibial plateau fracture with joint effusion Electronically Signed   By: Inez Catalina M.D.   On: 07/01/2017 19:13   Dg Femur Min 2 Views Right  Result Date: 07/01/2017 CLINICAL DATA:  Recent fall with leg pain, initial encounter EXAM: RIGHT FEMUR 2 VIEWS COMPARISON:  None. FINDINGS: The femur appears within normal limits. Some mild irregularity in the proximal tibia is again identified but again incompletely evaluated. IMPRESSION: Normal appearing femur. Electronically Signed   By: Inez Catalina M.D.   On: 07/01/2017 17:36    Procedures Procedures (including critical care time)  Medications Ordered in ED Medications  morphine 4 MG/ML injection 2 mg (2 mg Intravenous Given 07/01/17 1757)  morphine 4 MG/ML injection 4 mg (4 mg Intravenous Given 07/01/17 1844)     Initial Impression / Assessment and Plan / ED Course  I have reviewed the triage vital signs and the nursing notes.  Pertinent labs & imaging results that were available during my care of the patient were reviewed by me and considered in my medical decision making (see chart for details).     I spoke to Dr. Veverly Fells who advised Ct scan knee while pt is here.   Knee immbolizer,  Call on Monday to be seen by Dr. Stann Mainland. Pt given rx for walker and percocet.   Final Clinical Impressions(s) / ED Diagnoses   Final diagnoses:  Tibial plateau fracture, right, closed, initial encounter  Fall, initial encounter    New Prescriptions New Prescriptions   OXYCODONE-ACETAMINOPHEN (PERCOCET/ROXICET) 5-325 MG TABLET    Take 1-2 tablets by mouth every 4 (four) hours as needed for severe pain.  An After Visit Summary was printed and given to the patient.   Fransico Meadow, PA-C 07/01/17 2320    Fransico Meadow,  PA-C 07/01/17 2330    Fransico Meadow, PA-C 07/01/17 2355    Malvin Johns, MD 07/02/17 [phone]

## 2017-07-01 NOTE — Progress Notes (Signed)
Orthopedic Tech Progress Note Patient Details:  Sarah Cook 04/11/1951 [id number]  Ortho Devices Type of Ortho Device: Knee Immobilizer Ortho Device/Splint Location: rle Ortho Device/Splint Interventions: Ordered, Application, Adjustment   Karolee Stamps 07/01/2017, 9:06 PM

## 2017-07-01 NOTE — Discharge Instructions (Signed)
See the Orthopaedist for evaluation next week.  Call on Monday for an appointment with Dr. Stann Mainland.

## 2017-07-01 NOTE — ED Triage Notes (Signed)
PTAR- pt coming from home, fell off step ladder. Complains of right knee pain. No shortening or rotation noted to the knee. Strong pedal pulse noted. No LOC.

## 2017-07-01 NOTE — ED Notes (Signed)
Ortho tech paged  

## 2017-07-24 DIAGNOSIS — S82141D Displaced bicondylar fracture of right tibia, subsequent encounter for closed fracture with routine healing: Secondary | ICD-10-CM | POA: Diagnosis not present

## 2017-07-26 DIAGNOSIS — I509 Heart failure, unspecified: Secondary | ICD-10-CM | POA: Diagnosis not present

## 2017-07-26 DIAGNOSIS — Z9181 History of falling: Secondary | ICD-10-CM | POA: Diagnosis not present

## 2017-07-26 DIAGNOSIS — S82141D Displaced bicondylar fracture of right tibia, subsequent encounter for closed fracture with routine healing: Secondary | ICD-10-CM | POA: Diagnosis not present

## 2017-07-26 DIAGNOSIS — Z7982 Long term (current) use of aspirin: Secondary | ICD-10-CM | POA: Diagnosis not present

## 2017-07-26 DIAGNOSIS — Z853 Personal history of malignant neoplasm of breast: Secondary | ICD-10-CM | POA: Diagnosis not present

## 2017-07-26 DIAGNOSIS — M069 Rheumatoid arthritis, unspecified: Secondary | ICD-10-CM | POA: Diagnosis not present

## 2017-07-26 DIAGNOSIS — I110 Hypertensive heart disease with heart failure: Secondary | ICD-10-CM | POA: Diagnosis not present

## 2017-07-27 DIAGNOSIS — I110 Hypertensive heart disease with heart failure: Secondary | ICD-10-CM | POA: Diagnosis not present

## 2017-07-27 DIAGNOSIS — M069 Rheumatoid arthritis, unspecified: Secondary | ICD-10-CM | POA: Diagnosis not present

## 2017-07-27 DIAGNOSIS — S82141D Displaced bicondylar fracture of right tibia, subsequent encounter for closed fracture with routine healing: Secondary | ICD-10-CM | POA: Diagnosis not present

## 2017-07-27 DIAGNOSIS — Z9181 History of falling: Secondary | ICD-10-CM | POA: Diagnosis not present

## 2017-07-27 DIAGNOSIS — Z853 Personal history of malignant neoplasm of breast: Secondary | ICD-10-CM | POA: Diagnosis not present

## 2017-07-27 DIAGNOSIS — Z7982 Long term (current) use of aspirin: Secondary | ICD-10-CM | POA: Diagnosis not present

## 2017-07-27 DIAGNOSIS — I509 Heart failure, unspecified: Secondary | ICD-10-CM | POA: Diagnosis not present

## 2017-07-31 DIAGNOSIS — Z7982 Long term (current) use of aspirin: Secondary | ICD-10-CM | POA: Diagnosis not present

## 2017-07-31 DIAGNOSIS — M069 Rheumatoid arthritis, unspecified: Secondary | ICD-10-CM | POA: Diagnosis not present

## 2017-07-31 DIAGNOSIS — I509 Heart failure, unspecified: Secondary | ICD-10-CM | POA: Diagnosis not present

## 2017-07-31 DIAGNOSIS — Z9181 History of falling: Secondary | ICD-10-CM | POA: Diagnosis not present

## 2017-07-31 DIAGNOSIS — Z853 Personal history of malignant neoplasm of breast: Secondary | ICD-10-CM | POA: Diagnosis not present

## 2017-07-31 DIAGNOSIS — I110 Hypertensive heart disease with heart failure: Secondary | ICD-10-CM | POA: Diagnosis not present

## 2017-07-31 DIAGNOSIS — S82141D Displaced bicondylar fracture of right tibia, subsequent encounter for closed fracture with routine healing: Secondary | ICD-10-CM | POA: Diagnosis not present

## 2017-08-01 DIAGNOSIS — S82141D Displaced bicondylar fracture of right tibia, subsequent encounter for closed fracture with routine healing: Secondary | ICD-10-CM | POA: Diagnosis not present

## 2017-08-01 DIAGNOSIS — Z853 Personal history of malignant neoplasm of breast: Secondary | ICD-10-CM | POA: Diagnosis not present

## 2017-08-01 DIAGNOSIS — M069 Rheumatoid arthritis, unspecified: Secondary | ICD-10-CM | POA: Diagnosis not present

## 2017-08-01 DIAGNOSIS — Z7982 Long term (current) use of aspirin: Secondary | ICD-10-CM | POA: Diagnosis not present

## 2017-08-01 DIAGNOSIS — Z9181 History of falling: Secondary | ICD-10-CM | POA: Diagnosis not present

## 2017-08-01 DIAGNOSIS — I509 Heart failure, unspecified: Secondary | ICD-10-CM | POA: Diagnosis not present

## 2017-08-01 DIAGNOSIS — I110 Hypertensive heart disease with heart failure: Secondary | ICD-10-CM | POA: Diagnosis not present

## 2017-08-04 DIAGNOSIS — Z7982 Long term (current) use of aspirin: Secondary | ICD-10-CM | POA: Diagnosis not present

## 2017-08-04 DIAGNOSIS — Z853 Personal history of malignant neoplasm of breast: Secondary | ICD-10-CM | POA: Diagnosis not present

## 2017-08-04 DIAGNOSIS — M069 Rheumatoid arthritis, unspecified: Secondary | ICD-10-CM | POA: Diagnosis not present

## 2017-08-04 DIAGNOSIS — S82141D Displaced bicondylar fracture of right tibia, subsequent encounter for closed fracture with routine healing: Secondary | ICD-10-CM | POA: Diagnosis not present

## 2017-08-04 DIAGNOSIS — I509 Heart failure, unspecified: Secondary | ICD-10-CM | POA: Diagnosis not present

## 2017-08-04 DIAGNOSIS — I110 Hypertensive heart disease with heart failure: Secondary | ICD-10-CM | POA: Diagnosis not present

## 2017-08-04 DIAGNOSIS — Z9181 History of falling: Secondary | ICD-10-CM | POA: Diagnosis not present

## 2017-08-09 DIAGNOSIS — I509 Heart failure, unspecified: Secondary | ICD-10-CM | POA: Diagnosis not present

## 2017-08-09 DIAGNOSIS — Z7982 Long term (current) use of aspirin: Secondary | ICD-10-CM | POA: Diagnosis not present

## 2017-08-09 DIAGNOSIS — I110 Hypertensive heart disease with heart failure: Secondary | ICD-10-CM | POA: Diagnosis not present

## 2017-08-09 DIAGNOSIS — Z9181 History of falling: Secondary | ICD-10-CM | POA: Diagnosis not present

## 2017-08-09 DIAGNOSIS — Z853 Personal history of malignant neoplasm of breast: Secondary | ICD-10-CM | POA: Diagnosis not present

## 2017-08-09 DIAGNOSIS — M069 Rheumatoid arthritis, unspecified: Secondary | ICD-10-CM | POA: Diagnosis not present

## 2017-08-09 DIAGNOSIS — S82141D Displaced bicondylar fracture of right tibia, subsequent encounter for closed fracture with routine healing: Secondary | ICD-10-CM | POA: Diagnosis not present

## 2017-08-11 DIAGNOSIS — I110 Hypertensive heart disease with heart failure: Secondary | ICD-10-CM | POA: Diagnosis not present

## 2017-08-11 DIAGNOSIS — M069 Rheumatoid arthritis, unspecified: Secondary | ICD-10-CM | POA: Diagnosis not present

## 2017-08-11 DIAGNOSIS — Z9181 History of falling: Secondary | ICD-10-CM | POA: Diagnosis not present

## 2017-08-11 DIAGNOSIS — Z853 Personal history of malignant neoplasm of breast: Secondary | ICD-10-CM | POA: Diagnosis not present

## 2017-08-11 DIAGNOSIS — Z7982 Long term (current) use of aspirin: Secondary | ICD-10-CM | POA: Diagnosis not present

## 2017-08-11 DIAGNOSIS — I509 Heart failure, unspecified: Secondary | ICD-10-CM | POA: Diagnosis not present

## 2017-08-11 DIAGNOSIS — S82141D Displaced bicondylar fracture of right tibia, subsequent encounter for closed fracture with routine healing: Secondary | ICD-10-CM | POA: Diagnosis not present

## 2017-08-15 DIAGNOSIS — Z9181 History of falling: Secondary | ICD-10-CM | POA: Diagnosis not present

## 2017-08-15 DIAGNOSIS — I509 Heart failure, unspecified: Secondary | ICD-10-CM | POA: Diagnosis not present

## 2017-08-15 DIAGNOSIS — Z853 Personal history of malignant neoplasm of breast: Secondary | ICD-10-CM | POA: Diagnosis not present

## 2017-08-15 DIAGNOSIS — S82141D Displaced bicondylar fracture of right tibia, subsequent encounter for closed fracture with routine healing: Secondary | ICD-10-CM | POA: Diagnosis not present

## 2017-08-15 DIAGNOSIS — I110 Hypertensive heart disease with heart failure: Secondary | ICD-10-CM | POA: Diagnosis not present

## 2017-08-15 DIAGNOSIS — Z7982 Long term (current) use of aspirin: Secondary | ICD-10-CM | POA: Diagnosis not present

## 2017-08-15 DIAGNOSIS — M069 Rheumatoid arthritis, unspecified: Secondary | ICD-10-CM | POA: Diagnosis not present

## 2017-08-16 DIAGNOSIS — S82141D Displaced bicondylar fracture of right tibia, subsequent encounter for closed fracture with routine healing: Secondary | ICD-10-CM | POA: Diagnosis not present

## 2017-08-22 ENCOUNTER — Telehealth: Payer: Self-pay | Admitting: Cardiovascular Disease

## 2017-08-22 NOTE — Telephone Encounter (Signed)
Please call,wants to know if she should be taking Furosemide?

## 2017-08-22 NOTE — Telephone Encounter (Signed)
Left detailed message that patient is to take lasix as prescribed daily per last visit 09/2016 and no med changes were made at last visit. Advised that she call back to schedule her yearly visit and can reach out via MyChart w/non-acute questions or concerns.

## 2017-08-23 DIAGNOSIS — I509 Heart failure, unspecified: Secondary | ICD-10-CM | POA: Diagnosis not present

## 2017-08-23 DIAGNOSIS — Z9181 History of falling: Secondary | ICD-10-CM | POA: Diagnosis not present

## 2017-08-23 DIAGNOSIS — Z7982 Long term (current) use of aspirin: Secondary | ICD-10-CM | POA: Diagnosis not present

## 2017-08-23 DIAGNOSIS — Z853 Personal history of malignant neoplasm of breast: Secondary | ICD-10-CM | POA: Diagnosis not present

## 2017-08-23 DIAGNOSIS — M069 Rheumatoid arthritis, unspecified: Secondary | ICD-10-CM | POA: Diagnosis not present

## 2017-08-23 DIAGNOSIS — I110 Hypertensive heart disease with heart failure: Secondary | ICD-10-CM | POA: Diagnosis not present

## 2017-08-23 DIAGNOSIS — S82141D Displaced bicondylar fracture of right tibia, subsequent encounter for closed fracture with routine healing: Secondary | ICD-10-CM | POA: Diagnosis not present

## 2017-08-25 DIAGNOSIS — S82141D Displaced bicondylar fracture of right tibia, subsequent encounter for closed fracture with routine healing: Secondary | ICD-10-CM | POA: Diagnosis not present

## 2017-08-25 DIAGNOSIS — Z7982 Long term (current) use of aspirin: Secondary | ICD-10-CM | POA: Diagnosis not present

## 2017-08-25 DIAGNOSIS — Z9181 History of falling: Secondary | ICD-10-CM | POA: Diagnosis not present

## 2017-08-25 DIAGNOSIS — I110 Hypertensive heart disease with heart failure: Secondary | ICD-10-CM | POA: Diagnosis not present

## 2017-08-25 DIAGNOSIS — M069 Rheumatoid arthritis, unspecified: Secondary | ICD-10-CM | POA: Diagnosis not present

## 2017-08-25 DIAGNOSIS — I509 Heart failure, unspecified: Secondary | ICD-10-CM | POA: Diagnosis not present

## 2017-08-25 DIAGNOSIS — Z853 Personal history of malignant neoplasm of breast: Secondary | ICD-10-CM | POA: Diagnosis not present

## 2017-08-28 DIAGNOSIS — M069 Rheumatoid arthritis, unspecified: Secondary | ICD-10-CM | POA: Diagnosis not present

## 2017-08-28 DIAGNOSIS — I110 Hypertensive heart disease with heart failure: Secondary | ICD-10-CM | POA: Diagnosis not present

## 2017-08-28 DIAGNOSIS — I509 Heart failure, unspecified: Secondary | ICD-10-CM | POA: Diagnosis not present

## 2017-08-28 DIAGNOSIS — Z9181 History of falling: Secondary | ICD-10-CM | POA: Diagnosis not present

## 2017-08-28 DIAGNOSIS — Z853 Personal history of malignant neoplasm of breast: Secondary | ICD-10-CM | POA: Diagnosis not present

## 2017-08-28 DIAGNOSIS — Z7982 Long term (current) use of aspirin: Secondary | ICD-10-CM | POA: Diagnosis not present

## 2017-08-28 DIAGNOSIS — S82141D Displaced bicondylar fracture of right tibia, subsequent encounter for closed fracture with routine healing: Secondary | ICD-10-CM | POA: Diagnosis not present

## 2017-08-31 DIAGNOSIS — M069 Rheumatoid arthritis, unspecified: Secondary | ICD-10-CM | POA: Diagnosis not present

## 2017-08-31 DIAGNOSIS — I509 Heart failure, unspecified: Secondary | ICD-10-CM | POA: Diagnosis not present

## 2017-08-31 DIAGNOSIS — S82141D Displaced bicondylar fracture of right tibia, subsequent encounter for closed fracture with routine healing: Secondary | ICD-10-CM | POA: Diagnosis not present

## 2017-08-31 DIAGNOSIS — Z7982 Long term (current) use of aspirin: Secondary | ICD-10-CM | POA: Diagnosis not present

## 2017-08-31 DIAGNOSIS — Z853 Personal history of malignant neoplasm of breast: Secondary | ICD-10-CM | POA: Diagnosis not present

## 2017-08-31 DIAGNOSIS — I110 Hypertensive heart disease with heart failure: Secondary | ICD-10-CM | POA: Diagnosis not present

## 2017-08-31 DIAGNOSIS — Z9181 History of falling: Secondary | ICD-10-CM | POA: Diagnosis not present

## 2017-09-05 DIAGNOSIS — Z7982 Long term (current) use of aspirin: Secondary | ICD-10-CM | POA: Diagnosis not present

## 2017-09-05 DIAGNOSIS — S82141D Displaced bicondylar fracture of right tibia, subsequent encounter for closed fracture with routine healing: Secondary | ICD-10-CM | POA: Diagnosis not present

## 2017-09-05 DIAGNOSIS — Z853 Personal history of malignant neoplasm of breast: Secondary | ICD-10-CM | POA: Diagnosis not present

## 2017-09-05 DIAGNOSIS — I509 Heart failure, unspecified: Secondary | ICD-10-CM | POA: Diagnosis not present

## 2017-09-05 DIAGNOSIS — I110 Hypertensive heart disease with heart failure: Secondary | ICD-10-CM | POA: Diagnosis not present

## 2017-09-05 DIAGNOSIS — M069 Rheumatoid arthritis, unspecified: Secondary | ICD-10-CM | POA: Diagnosis not present

## 2017-09-05 DIAGNOSIS — Z9181 History of falling: Secondary | ICD-10-CM | POA: Diagnosis not present

## 2017-09-07 DIAGNOSIS — I509 Heart failure, unspecified: Secondary | ICD-10-CM | POA: Diagnosis not present

## 2017-09-07 DIAGNOSIS — Z853 Personal history of malignant neoplasm of breast: Secondary | ICD-10-CM | POA: Diagnosis not present

## 2017-09-07 DIAGNOSIS — Z9181 History of falling: Secondary | ICD-10-CM | POA: Diagnosis not present

## 2017-09-07 DIAGNOSIS — I110 Hypertensive heart disease with heart failure: Secondary | ICD-10-CM | POA: Diagnosis not present

## 2017-09-07 DIAGNOSIS — S82141D Displaced bicondylar fracture of right tibia, subsequent encounter for closed fracture with routine healing: Secondary | ICD-10-CM | POA: Diagnosis not present

## 2017-09-07 DIAGNOSIS — Z7982 Long term (current) use of aspirin: Secondary | ICD-10-CM | POA: Diagnosis not present

## 2017-09-07 DIAGNOSIS — M069 Rheumatoid arthritis, unspecified: Secondary | ICD-10-CM | POA: Diagnosis not present

## 2017-09-11 DIAGNOSIS — S82141D Displaced bicondylar fracture of right tibia, subsequent encounter for closed fracture with routine healing: Secondary | ICD-10-CM | POA: Diagnosis not present

## 2017-09-11 DIAGNOSIS — M069 Rheumatoid arthritis, unspecified: Secondary | ICD-10-CM | POA: Diagnosis not present

## 2017-09-11 DIAGNOSIS — I509 Heart failure, unspecified: Secondary | ICD-10-CM | POA: Diagnosis not present

## 2017-09-11 DIAGNOSIS — Z853 Personal history of malignant neoplasm of breast: Secondary | ICD-10-CM | POA: Diagnosis not present

## 2017-09-11 DIAGNOSIS — I110 Hypertensive heart disease with heart failure: Secondary | ICD-10-CM | POA: Diagnosis not present

## 2017-09-11 DIAGNOSIS — Z7982 Long term (current) use of aspirin: Secondary | ICD-10-CM | POA: Diagnosis not present

## 2017-09-11 DIAGNOSIS — Z9181 History of falling: Secondary | ICD-10-CM | POA: Diagnosis not present

## 2017-09-12 DIAGNOSIS — S82141D Displaced bicondylar fracture of right tibia, subsequent encounter for closed fracture with routine healing: Secondary | ICD-10-CM | POA: Diagnosis not present

## 2017-09-15 DIAGNOSIS — M069 Rheumatoid arthritis, unspecified: Secondary | ICD-10-CM | POA: Diagnosis not present

## 2017-09-15 DIAGNOSIS — Z9181 History of falling: Secondary | ICD-10-CM | POA: Diagnosis not present

## 2017-09-15 DIAGNOSIS — I110 Hypertensive heart disease with heart failure: Secondary | ICD-10-CM | POA: Diagnosis not present

## 2017-09-15 DIAGNOSIS — S82141D Displaced bicondylar fracture of right tibia, subsequent encounter for closed fracture with routine healing: Secondary | ICD-10-CM | POA: Diagnosis not present

## 2017-09-15 DIAGNOSIS — Z853 Personal history of malignant neoplasm of breast: Secondary | ICD-10-CM | POA: Diagnosis not present

## 2017-09-15 DIAGNOSIS — Z7982 Long term (current) use of aspirin: Secondary | ICD-10-CM | POA: Diagnosis not present

## 2017-09-15 DIAGNOSIS — I509 Heart failure, unspecified: Secondary | ICD-10-CM | POA: Diagnosis not present

## 2017-09-18 DIAGNOSIS — Z9181 History of falling: Secondary | ICD-10-CM | POA: Diagnosis not present

## 2017-09-18 DIAGNOSIS — M069 Rheumatoid arthritis, unspecified: Secondary | ICD-10-CM | POA: Diagnosis not present

## 2017-09-18 DIAGNOSIS — I110 Hypertensive heart disease with heart failure: Secondary | ICD-10-CM | POA: Diagnosis not present

## 2017-09-18 DIAGNOSIS — Z853 Personal history of malignant neoplasm of breast: Secondary | ICD-10-CM | POA: Diagnosis not present

## 2017-09-18 DIAGNOSIS — Z7982 Long term (current) use of aspirin: Secondary | ICD-10-CM | POA: Diagnosis not present

## 2017-09-18 DIAGNOSIS — I509 Heart failure, unspecified: Secondary | ICD-10-CM | POA: Diagnosis not present

## 2017-09-18 DIAGNOSIS — S82141D Displaced bicondylar fracture of right tibia, subsequent encounter for closed fracture with routine healing: Secondary | ICD-10-CM | POA: Diagnosis not present

## 2017-10-09 DIAGNOSIS — R350 Frequency of micturition: Secondary | ICD-10-CM | POA: Diagnosis not present

## 2017-10-11 ENCOUNTER — Emergency Department (HOSPITAL_COMMUNITY): Payer: BLUE CROSS/BLUE SHIELD

## 2017-10-11 ENCOUNTER — Other Ambulatory Visit: Payer: Self-pay

## 2017-10-11 ENCOUNTER — Inpatient Hospital Stay (HOSPITAL_COMMUNITY): Payer: BLUE CROSS/BLUE SHIELD

## 2017-10-11 ENCOUNTER — Inpatient Hospital Stay (HOSPITAL_COMMUNITY)
Admission: EM | Admit: 2017-10-11 | Discharge: 2017-10-19 | DRG: 304 | Disposition: A | Discharge status: Skilled Nursing Facility | Payer: BLUE CROSS/BLUE SHIELD | Attending: Family Medicine | Admitting: Family Medicine

## 2017-10-11 ENCOUNTER — Encounter (HOSPITAL_COMMUNITY): Payer: Self-pay | Admitting: Emergency Medicine

## 2017-10-11 DIAGNOSIS — E785 Hyperlipidemia, unspecified: Secondary | ICD-10-CM | POA: Diagnosis present

## 2017-10-11 DIAGNOSIS — Z9221 Personal history of antineoplastic chemotherapy: Secondary | ICD-10-CM

## 2017-10-11 DIAGNOSIS — M810 Age-related osteoporosis without current pathological fracture: Secondary | ICD-10-CM | POA: Diagnosis present

## 2017-10-11 DIAGNOSIS — Z91018 Allergy to other foods: Secondary | ICD-10-CM

## 2017-10-11 DIAGNOSIS — R918 Other nonspecific abnormal finding of lung field: Secondary | ICD-10-CM | POA: Diagnosis present

## 2017-10-11 DIAGNOSIS — R410 Disorientation, unspecified: Secondary | ICD-10-CM | POA: Diagnosis not present

## 2017-10-11 DIAGNOSIS — Z8673 Personal history of transient ischemic attack (TIA), and cerebral infarction without residual deficits: Secondary | ICD-10-CM | POA: Diagnosis not present

## 2017-10-11 DIAGNOSIS — W1789XS Other fall from one level to another, sequela: Secondary | ICD-10-CM

## 2017-10-11 DIAGNOSIS — Z9011 Acquired absence of right breast and nipple: Secondary | ICD-10-CM | POA: Diagnosis not present

## 2017-10-11 DIAGNOSIS — F419 Anxiety disorder, unspecified: Secondary | ICD-10-CM | POA: Diagnosis not present

## 2017-10-11 DIAGNOSIS — Z9012 Acquired absence of left breast and nipple: Secondary | ICD-10-CM | POA: Diagnosis not present

## 2017-10-11 DIAGNOSIS — E210 Primary hyperparathyroidism: Secondary | ICD-10-CM | POA: Diagnosis present

## 2017-10-11 DIAGNOSIS — Z885 Allergy status to narcotic agent status: Secondary | ICD-10-CM | POA: Diagnosis not present

## 2017-10-11 DIAGNOSIS — R4182 Altered mental status, unspecified: Secondary | ICD-10-CM | POA: Diagnosis present

## 2017-10-11 DIAGNOSIS — I160 Hypertensive urgency: Principal | ICD-10-CM | POA: Diagnosis present

## 2017-10-11 DIAGNOSIS — S0990XA Unspecified injury of head, initial encounter: Secondary | ICD-10-CM | POA: Diagnosis not present

## 2017-10-11 DIAGNOSIS — G459 Transient cerebral ischemic attack, unspecified: Secondary | ICD-10-CM | POA: Diagnosis present

## 2017-10-11 DIAGNOSIS — E892 Postprocedural hypoparathyroidism: Secondary | ICD-10-CM | POA: Diagnosis not present

## 2017-10-11 DIAGNOSIS — Z79899 Other long term (current) drug therapy: Secondary | ICD-10-CM

## 2017-10-11 DIAGNOSIS — E213 Hyperparathyroidism, unspecified: Secondary | ICD-10-CM | POA: Diagnosis not present

## 2017-10-11 DIAGNOSIS — G9340 Encephalopathy, unspecified: Secondary | ICD-10-CM | POA: Diagnosis not present

## 2017-10-11 DIAGNOSIS — I361 Nonrheumatic tricuspid (valve) insufficiency: Secondary | ICD-10-CM | POA: Diagnosis not present

## 2017-10-11 DIAGNOSIS — G44309 Post-traumatic headache, unspecified, not intractable: Secondary | ICD-10-CM | POA: Diagnosis not present

## 2017-10-11 DIAGNOSIS — R51 Headache: Secondary | ICD-10-CM | POA: Diagnosis not present

## 2017-10-11 DIAGNOSIS — Z9181 History of falling: Secondary | ICD-10-CM | POA: Diagnosis not present

## 2017-10-11 DIAGNOSIS — I5030 Unspecified diastolic (congestive) heart failure: Secondary | ICD-10-CM | POA: Diagnosis not present

## 2017-10-11 DIAGNOSIS — R404 Transient alteration of awareness: Secondary | ICD-10-CM

## 2017-10-11 DIAGNOSIS — S060X0A Concussion without loss of consciousness, initial encounter: Secondary | ICD-10-CM | POA: Diagnosis not present

## 2017-10-11 DIAGNOSIS — G9341 Metabolic encephalopathy: Secondary | ICD-10-CM | POA: Diagnosis not present

## 2017-10-11 DIAGNOSIS — E212 Other hyperparathyroidism: Secondary | ICD-10-CM | POA: Diagnosis not present

## 2017-10-11 DIAGNOSIS — Z803 Family history of malignant neoplasm of breast: Secondary | ICD-10-CM

## 2017-10-11 DIAGNOSIS — I6789 Other cerebrovascular disease: Secondary | ICD-10-CM | POA: Diagnosis not present

## 2017-10-11 DIAGNOSIS — F0781 Postconcussional syndrome: Secondary | ICD-10-CM | POA: Diagnosis not present

## 2017-10-11 DIAGNOSIS — F339 Major depressive disorder, recurrent, unspecified: Secondary | ICD-10-CM | POA: Diagnosis not present

## 2017-10-11 DIAGNOSIS — Z888 Allergy status to other drugs, medicaments and biological substances status: Secondary | ICD-10-CM

## 2017-10-11 DIAGNOSIS — I739 Peripheral vascular disease, unspecified: Secondary | ICD-10-CM | POA: Diagnosis not present

## 2017-10-11 DIAGNOSIS — G8911 Acute pain due to trauma: Secondary | ICD-10-CM | POA: Diagnosis not present

## 2017-10-11 DIAGNOSIS — I10 Essential (primary) hypertension: Secondary | ICD-10-CM

## 2017-10-11 DIAGNOSIS — Z9049 Acquired absence of other specified parts of digestive tract: Secondary | ICD-10-CM | POA: Diagnosis not present

## 2017-10-11 DIAGNOSIS — Z853 Personal history of malignant neoplasm of breast: Secondary | ICD-10-CM | POA: Diagnosis not present

## 2017-10-11 DIAGNOSIS — I674 Hypertensive encephalopathy: Secondary | ICD-10-CM | POA: Diagnosis not present

## 2017-10-11 DIAGNOSIS — R531 Weakness: Secondary | ICD-10-CM | POA: Diagnosis not present

## 2017-10-11 DIAGNOSIS — H2589 Other age-related cataract: Secondary | ICD-10-CM | POA: Diagnosis not present

## 2017-10-11 DIAGNOSIS — E7849 Other hyperlipidemia: Secondary | ICD-10-CM | POA: Diagnosis not present

## 2017-10-11 DIAGNOSIS — Z8249 Family history of ischemic heart disease and other diseases of the circulatory system: Secondary | ICD-10-CM | POA: Diagnosis not present

## 2017-10-11 DIAGNOSIS — R4020 Unspecified coma: Secondary | ICD-10-CM | POA: Diagnosis not present

## 2017-10-11 DIAGNOSIS — R488 Other symbolic dysfunctions: Secondary | ICD-10-CM | POA: Diagnosis not present

## 2017-10-11 DIAGNOSIS — Z91040 Latex allergy status: Secondary | ICD-10-CM

## 2017-10-11 DIAGNOSIS — M6281 Muscle weakness (generalized): Secondary | ICD-10-CM | POA: Diagnosis not present

## 2017-10-11 DIAGNOSIS — R2990 Unspecified symptoms and signs involving the nervous system: Secondary | ICD-10-CM | POA: Diagnosis not present

## 2017-10-11 LAB — COMPREHENSIVE METABOLIC PANEL
ALK PHOS: 37 U/L — AB (ref 38–126)
ALT: 14 U/L (ref 14–54)
AST: 32 U/L (ref 15–41)
Albumin: 3.7 g/dL (ref 3.5–5.0)
Anion gap: 10 (ref 5–15)
BILIRUBIN TOTAL: 1.6 mg/dL — AB (ref 0.3–1.2)
BUN: 13 mg/dL (ref 6–20)
CO2: 23 mmol/L (ref 22–32)
CREATININE: 1.01 mg/dL — AB (ref 0.44–1.00)
Calcium: 11 mg/dL — ABNORMAL HIGH (ref 8.9–10.3)
Chloride: 105 mmol/L (ref 101–111)
GFR calc Af Amer: >60 mL/min (ref >60)
GFR, EST NON AFRICAN AMERICAN: 56 mL/min — AB (ref >60)
GLUCOSE: 92 mg/dL (ref 65–99)
POTASSIUM: 3.9 mmol/L (ref 3.5–5.1)
Sodium: 138 mmol/L (ref 135–145)
Total Protein: 6.5 g/dL (ref 6.5–8.1)

## 2017-10-11 LAB — RAPID URINE DRUG SCREEN, HOSP PERFORMED
Amphetamines: NOT DETECTED
Barbiturates: NOT DETECTED
Benzodiazepines: NOT DETECTED
Cocaine: NOT DETECTED
Opiates: NOT DETECTED
TETRAHYDROCANNABINOL: NOT DETECTED

## 2017-10-11 LAB — URINALYSIS, ROUTINE W REFLEX MICROSCOPIC
Bilirubin Urine: NEGATIVE
GLUCOSE, UA: NEGATIVE mg/dL
HGB URINE DIPSTICK: NEGATIVE
KETONES UR: 5 mg/dL — AB
LEUKOCYTES UA: NEGATIVE
Nitrite: NEGATIVE
PH: 5 (ref 5.0–8.0)
PROTEIN: NEGATIVE mg/dL
Specific Gravity, Urine: 1.016 (ref 1.005–1.030)

## 2017-10-11 LAB — CBC WITH DIFFERENTIAL/PLATELET
BASOS ABS: 0 10*3/uL (ref 0.0–0.1)
Basophils Relative: 0 %
EOS PCT: 2 %
Eosinophils Absolute: 0.1 10*3/uL (ref 0.0–0.7)
HEMATOCRIT: 31.9 % — AB (ref 36.0–46.0)
Hemoglobin: 11 g/dL — ABNORMAL LOW (ref 12.0–15.0)
LYMPHS ABS: 1.5 10*3/uL (ref 0.7–4.0)
Lymphocytes Relative: 25 %
MCH: 32.1 pg (ref 26.0–34.0)
MCHC: 34.5 g/dL (ref 30.0–36.0)
MCV: 93 fL (ref 78.0–100.0)
MONO ABS: 0.6 10*3/uL (ref 0.1–1.0)
MONOS PCT: 10 %
NEUTROS ABS: 3.8 10*3/uL (ref 1.7–7.7)
Neutrophils Relative %: 63 %
PLATELETS: 258 10*3/uL (ref 150–400)
RBC: 3.43 MIL/uL — ABNORMAL LOW (ref 3.87–5.11)
RDW: 13.6 % (ref 11.5–15.5)
WBC: 6 10*3/uL (ref 4.0–10.5)

## 2017-10-11 LAB — AMMONIA: Ammonia: 10 umol/L (ref 9–35)

## 2017-10-11 LAB — TSH: TSH: 1.084 u[IU]/mL (ref 0.350–4.500)

## 2017-10-11 LAB — LIPASE, BLOOD: Lipase: 29 U/L (ref 11–51)

## 2017-10-11 LAB — VITAMIN B12: VITAMIN B 12: 425 pg/mL (ref 180–914)

## 2017-10-11 MED ORDER — MAGNESIUM OXIDE 400 (241.3 MG) MG PO TABS
400.0000 mg | ORAL_TABLET | Freq: Every day | ORAL | Status: DC
  Administered 2017-10-12 – 2017-10-19 (×5): 400 mg via ORAL
  Filled 2017-10-11 (×6): qty 1

## 2017-10-11 MED ORDER — SODIUM CHLORIDE 0.9 % IV BOLUS (SEPSIS)
500.0000 mL | Freq: Once | INTRAVENOUS | Status: AC
  Administered 2017-10-11: 500 mL via INTRAVENOUS

## 2017-10-11 MED ORDER — ENOXAPARIN SODIUM 40 MG/0.4ML ~~LOC~~ SOLN
40.0000 mg | SUBCUTANEOUS | Status: DC
  Administered 2017-10-13 – 2017-10-18 (×6): 40 mg via SUBCUTANEOUS
  Filled 2017-10-11 (×7): qty 0.4

## 2017-10-11 MED ORDER — ACETAMINOPHEN 325 MG PO TABS
650.0000 mg | ORAL_TABLET | Freq: Three times a day (TID) | ORAL | Status: DC | PRN
  Administered 2017-10-12 – 2017-10-18 (×8): 650 mg via ORAL
  Filled 2017-10-11 (×9): qty 2

## 2017-10-11 MED ORDER — DICLOFENAC SODIUM 1 % TD GEL
1.0000 "application " | Freq: Every day | TRANSDERMAL | Status: DC | PRN
  Filled 2017-10-11: qty 100

## 2017-10-11 MED ORDER — NEBIVOLOL HCL 10 MG PO TABS
10.0000 mg | ORAL_TABLET | Freq: Every day | ORAL | Status: DC
  Administered 2017-10-12 – 2017-10-19 (×5): 10 mg via ORAL
  Filled 2017-10-11 (×8): qty 1

## 2017-10-11 MED ORDER — LACTATED RINGERS IV SOLN
INTRAVENOUS | Status: DC
  Administered 2017-10-12: 05:00:00 via INTRAVENOUS

## 2017-10-11 MED ORDER — HYDRALAZINE HCL 50 MG PO TABS
50.0000 mg | ORAL_TABLET | Freq: Two times a day (BID) | ORAL | Status: DC
  Administered 2017-10-12: 50 mg via ORAL
  Filled 2017-10-11 (×2): qty 1

## 2017-10-11 MED ORDER — STROKE: EARLY STAGES OF RECOVERY BOOK
Freq: Once | Status: AC
  Administered 2017-10-12: 05:00:00

## 2017-10-11 MED ORDER — ACETAMINOPHEN ER 650 MG PO TBCR: 650.0000 mg | EXTENDED_RELEASE_TABLET | Freq: Three times a day (TID) | ORAL | Status: DC

## 2017-10-11 MED ORDER — IRBESARTAN 300 MG PO TABS
300.0000 mg | ORAL_TABLET | Freq: Every day | ORAL | Status: DC
Start: 2017-10-12 — End: 2017-10-19
  Administered 2017-10-12 – 2017-10-19 (×5): 300 mg via ORAL
  Filled 2017-10-11 (×5): qty 1

## 2017-10-11 NOTE — H&P (Signed)
History and Physical    Sarah Cook BMW:[id number] DOB: Jan 01, 1951 DOA: 10/11/2017  Referring MD/NP/PA: sent from PCP office to the ER PCP: Cari Caraway, MD Outpatient Specialists: [facility] Patient coming from: PCP office  Chief Complaint: confusion  HPI: Sarah Cook is a 66 y.o. female with medical history significant of HTN, h/o breast cancer, hyperparathyroidism and recent fall from >6 feet with resultant tibial plateau fracture.  She recently retired after working at [employer].  She reports that she had has problems since she fell off her ladder in August- She fell > 6 feet unwitnessed and thinks she may have hit her head.  She was found to have a fracture and was placed in a cast.  She has been going through rehab since then.  For the last few days she has noticed that her BP has been elevated over her normal.  And she has had some pain on her head on the right and posterior.  She has also had increasing weakness and confusion.  Patient worked with PT earlier today and they thought her left arm was weaker/droopy.  She went to her PCP where her BP was found to be elevated, they did not appreciate any weakness but sent her via EMS to have a TIA/CVA work up.  Patient denies fever/chill.  Occasional headache in the back and right side.  No dysuria or cough.  No new medications.  In the ER, labs unremarkable, U/A negative for infection.  CT scan of head showed age related volume loss with patchy periventricular small vessel disease, stable. No acute infarct evident. No mass or hemorrhage.    Review of Systems: all systems reviewed, negative unless stated above in HPI   Past Medical History:  Diagnosis Date  . Allergy   . Anxiety   . Breast cancer ([place])   . Cataract    early signs of  . Heart disease   . Hemorrhoids   . Hiatal hernia 2014   large  . Hyperlipidemia   . Hyperparathyroidism ([place])   . Hypertension   . Osteoporosis   . Parathyroid disorder ([place])   . Pulmonary  nodules   . TIA (transient ischemic attack)     Past Surgical History:  Procedure Laterality Date  . BLADDER SUSPENSION    . CESAREAN SECTION     x 2  . CHOLECYSTECTOMY    . MASTECTOMY Right   . PARATHYROIDECTOMY    . TOTAL ELBOW REPLACEMENT    . TUBAL LIGATION       reports that  has never smoked. she has never used smokeless tobacco. She reports that she does not drink alcohol or use drugs.  Allergies  Allergen Reactions  . Anesthetics, Ester Nausea And Vomiting  . Codeine Nausea Only  . Tape Other (See Comments)    Skin blisters  . Amlodipine Other (See Comments)  . Hydrochlorothiazide Other (See Comments)  . Onion     Does not like  . Latex Itching and Rash    Irritates skin    Family History  Problem Relation Age of Onset  . Breast cancer Mother   . Sudden death Father   . Heart failure Father   . Hypertension Unknown   . Heart disease Unknown   . Colon cancer Neg Hx   . Esophageal cancer Neg Hx   . Stomach cancer Neg Hx   . Rectal cancer Neg Hx      Prior to Admission medications   Medication Sig Start Date  End Date Taking? Authorizing Provider  acetaminophen (TYLENOL) 650 MG CR tablet Take 650 mg by mouth every 8 (eight) hours.   Yes [provider]  amitriptyline (ELAVIL) 10 MG tablet Take 10 mg by mouth at bedtime.   Yes [provider]  Cholecalciferol (VITAMIN D3) 2000 units TABS Take 2,000 Units by mouth daily.    Yes [provider]  CINNAMON PO Take 1 tablet by mouth daily.   Yes [provider]  Coenzyme Q10 (CO Q 10 PO) Take 1 tablet by mouth daily.   Yes [provider]  diclofenac sodium (VOLTAREN) 1 % GEL Apply 1 application topically daily as needed. 06/16/17  Yes [provider]  hydrALAZINE (APRESOLINE) 50 MG tablet Take 50 mg by mouth 2 (two) times daily.  12/15/15  Yes [provider]  irbesartan (AVAPRO) 300 MG tablet Take 300 mg by mouth daily. 06/12/17  Yes [provider]  magnesium oxide (MAG-OX) 400 MG tablet Take 400 mg by mouth daily.   Yes [provider]  nebivolol (BYSTOLIC) 10 MG tablet Take 10 mg by mouth daily.   Yes [provider]  OVER THE COUNTER MEDICATION Take 2 capsules by mouth at bedtime. Tumeric Black pepper 80m tab take 1 tab by mouth daily   Yes [provider]  oxyCODONE-acetaminophen (PERCOCET/ROXICET) 5-325 MG tablet Take 1-2 tablets by mouth every 4 (four) hours as needed for severe pain. 07/01/17  Yes SCaryl AdaK, PA-C  Probiotic Product (PROBIOTIC PO) Take 1 capsule by mouth daily. Takes a power   Yes [provider]  furosemide (LASIX) 40 MG tablet TAKE 1 TABLET(40 MG) BY MOUTH DAILY Patient not taking: Reported on 10/11/2017 02/07/17   Croitoru, MDani Gobble MD    Physical Exam: Vitals:   10/11/17 1434 10/11/17 1438  BP: (!) 190/96   Pulse: 80   Resp: 14   Temp: 98.6 F (37 C)   TempSrc: Oral   SpO2: 100%   Weight:  74.8 kg (165 lb)  Height:  5' 1"  (1.549 m)      Constitutional: pleasant/cooperative Vitals:   10/11/17 1434 10/11/17 1438  BP: (!) 190/96   Pulse: 80   Resp: 14   Temp: 98.6 F (37 C)   TempSrc: Oral   SpO2: 100%   Weight:  74.8 kg (165 lb)  Height:  5' 1"  (1.549 m)   Eyes: PERRL, lids and conjunctivae normal ENMT: Mucous membranes are dry. Posterior pharynx clear of any exudate or lesions.Normal dentition.  Neck: normal, supple, no masses, no thyromegaly Respiratory: clear to auscultation bilaterally, no wheezing, no crackles. Normal respiratory effort. No accessory muscle use.  Cardiovascular: Regular rate and rhythm, no murmurs / rubs / gallops. No extremity edema. 2+ pedal pulses. No carotid bruits.  Abdomen: no tenderness, no masses palpated. No hepatosplenomegaly. Bowel sounds positive.  Musculoskeletal: moves all 4 ext  Skin: mild peticeal red rash on left LE > right LE Neurologic/Psychiatric: tangential speech with some work finding difficulty, easily  distracted - poor focus No focal weakness appreacited    Labs on Admission: I have personally reviewed following labs and imaging studies  CBC: Recent Labs  Lab 10/11/17 1518  WBC 6.0  NEUTROABS 3.8  HGB 11.0*  HCT 31.9*  MCV 93.0  PLT 2161  Basic Metabolic Panel: Recent Labs  Lab 10/11/17 1518  NA 138  K 3.9  CL 105  CO2 23  GLUCOSE 92  BUN 13  CREATININE 1.01*  CALCIUM 11.0*  GFR: Estimated Creatinine Clearance: 50 mL/min (A) (by C-G formula based on SCr of 1.01 mg/dL (H)). Liver Function Tests: Recent Labs  Lab 10/11/17 1518  AST 32  ALT 14  ALKPHOS 37*  BILITOT 1.6*  PROT 6.5  ALBUMIN 3.7   Recent Labs  Lab 10/11/17 1518  LIPASE 29   No results for input(s): AMMONIA in the last 168 hours. Coagulation Profile: No results for input(s): INR, PROTIME in the last 168 hours. Cardiac Enzymes: No results for input(s): CKTOTAL, CKMB, CKMBINDEX, TROPONINI in the last 168 hours. BNP (last 3 results) No results for input(s): PROBNP in the last 8760 hours. HbA1C: No results for input(s): HGBA1C in the last 72 hours. CBG: No results for input(s): GLUCAP in the last 168 hours. Lipid Profile: No results for input(s): CHOL, HDL, LDLCALC, TRIG, CHOLHDL, LDLDIRECT in the last 72 hours. Thyroid Function Tests: No results for input(s): TSH, T4TOTAL, FREET4, T3FREE, THYROIDAB in the last 72 hours. Anemia Panel: No results for input(s): VITAMINB12, FOLATE, FERRITIN, TIBC, IRON, RETICCTPCT in the last 72 hours. Urine analysis:    Component Value Date/Time   COLORURINE YELLOW 10/11/2017 1706   APPEARANCEUR CLEAR 10/11/2017 1706   LABSPEC 1.016 10/11/2017 1706   PHURINE 5.0 10/11/2017 1706   GLUCOSEU NEGATIVE 10/11/2017 1706   HGBUR NEGATIVE 10/11/2017 1706   BILIRUBINUR NEGATIVE 10/11/2017 1706   KETONESUR 5 (A) 10/11/2017 1706   PROTEINUR NEGATIVE 10/11/2017 1706   UROBILINOGEN 0.2 02/20/2009 1445   NITRITE NEGATIVE 10/11/2017 1706   LEUKOCYTESUR NEGATIVE  10/11/2017 1706   Sepsis Labs: Invalid input(s): PROCALCITONIN, LACTICIDVEN No results found for this or any previous visit (from the past 240 hour(s)).   Radiological Exams on Admission: Ct Head Wo Contrast  Result Date: 10/11/2017 CLINICAL DATA:  Altered level of consciousness EXAM: CT HEAD WITHOUT CONTRAST TECHNIQUE: Contiguous axial images were obtained from the base of the skull through the vertex without intravenous contrast. COMPARISON:  July 01, 2017 FINDINGS: Brain: Age related volume loss is stable. There is no intracranial mass, hemorrhage, extra-axial fluid collection, or midline shift. There is patchy small vessel disease in the centra semiovale bilaterally. Elsewhere gray-white compartments appear normal. There is no evident acute infarct. Vascular: No hyperdense vessel. There is modest calcification in the carotid siphon regions bilaterally. Skull: Bony calvarium appears intact. Sinuses/Orbits: There is slight mucosal thickening and anterior ethmoid air cells. Other visualized paranasal sinuses are clear. Orbits appear symmetric and unremarkable bilaterally. Other: Mastoid air cells are clear. IMPRESSION: Age related volume loss with patchy periventricular small vessel disease, stable. No acute infarct evident. No mass or hemorrhage. Modest arterial vascular calcification noted. Mild mucosal thickening in anterior ethmoid air cells. Electronically Signed   By: Lowella Grip III M.D.   On: 10/11/2017 15:45    EKG: Independently reviewed. sinus  Assessment/Plan Active Problems:   Hyperparathyroidism (HCC)   TIA (transient ischemic attack)   Altered mental status  AMS -unclear etiology -? PRES syndrome as BP high vs TIA/CVA -MRI pending -echo/carotid/EEG -FLP/HgbA1c -B12/TSH/RPR/ammonia/UDS pending -U/A unremarkable -neurology consult once MRI back  Hyperparathyroidism -check TSH  Hypertensive urgency -will allow permissive HTN until MRI back  H/o breast  cancer -2000, R breast mastectomy, SN, chemo, ER/PR negative, HER2/Neu neg-- saw Dr. Marko Plume  DVT prophylaxis: lovenox Code Status: full Family Communication: patient and husband Disposition Plan:  Consults called:  Admission status: tele inpt-- patient meets inpt as she has new AMS and is not at her baseline mentally   Geradine Girt DO [facility] Pager [phone]  If 7PM-7AM, please contact night-coverage [email or web address] Password [facility]  10/11/2017, 5:34 PM

## 2017-10-11 NOTE — ED Notes (Signed)
Patient aware that urine specimen needed but unable to urinate at this time.

## 2017-10-11 NOTE — ED Provider Notes (Signed)
Emergency Department Provider Note   I have reviewed the triage vital signs and the nursing notes.   HISTORY  Chief Complaint Altered Mental Status   HPI Sarah Cook is a 66 y.o. female with PMH of HLD, HTN, and TIA presents to the emergency department for evaluation of increasing confusion.  Patient has had worsening confusion over the past 6 months but the husband states she has been more confused over the past 24 hours.  Her symptoms typically wax and wane but have been more constant.  They saw their primary care physician who transported the patient by ambulance to the emergency department.  Husband denies any fevers, cough.  Patient denies any burning with urination or pain currently.   Level 5 caveat: Confusion.    Past Medical History:  Diagnosis Date  . Allergy   . Anxiety   . Breast cancer ([place])   . Cataract    early signs of  . Heart disease   . Hemorrhoids   . Hiatal hernia 2014   large  . Hyperlipidemia   . Hyperparathyroidism ([place])   . Hypertension   . Osteoporosis   . Parathyroid disorder ([place])   . Pulmonary nodules   . TIA (transient ischemic attack)     Patient Active Problem List   Diagnosis Date Noted  . Altered mental status 10/11/2017  . 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[place]) 03/09/2016  . Chest pain 2016-02-13  . History of antineoplastic chemotherapy with cardiotoxic drugs February 13, 2016  . Family history of sudden cardiac death in father 02-13-2016  . Breast cancer ([place])   . Hemorrhoids   . Hiatal hernia   . Pulmonary nodules   . Hyperparathyroidism ([place])   . Hypertension   . Hyperlipidemia   . Heart disease   . Allergy   . Anxiety   . TIA (transient ischemic attack)   . Cataract   . Osteoporosis   . Parathyroid disorder ([place])   . Irritable bowel syndrome 12/11/2013  . Headache(784.0) 12/10/2013    Past Surgical History:  Procedure Laterality Date  . BLADDER SUSPENSION    . CESAREAN SECTION     x 2  .  CHOLECYSTECTOMY    . MASTECTOMY Right   . PARATHYROIDECTOMY    . TOTAL ELBOW REPLACEMENT    . TUBAL LIGATION        Allergies Anesthetics, ester; Codeine; Tape; Amlodipine; Hydrochlorothiazide; Onion; and Latex  Family History  Problem Relation Age of Onset  . Breast cancer Mother   . Sudden death Father   . Heart failure Father   . Hypertension Unknown   . Heart disease Unknown   . Colon cancer Neg Hx   . Esophageal cancer Neg Hx   . Stomach cancer Neg Hx   . Rectal cancer Neg Hx     Social History Social History   Tobacco Use  . Smoking status: Never Smoker  . Smokeless tobacco: Never Used  Substance Use Topics  . Alcohol use: No    Alcohol/week: 0.0 oz    Comment: On Holidays  . Drug use: No    Review of Systems  Level 5 caveat: AMS  ____________________________________________   PHYSICAL EXAM:  VITAL SIGNS: ED Triage Vitals  Enc Vitals Group     BP 10/11/17 1434 (!) 190/96     Pulse Rate 10/11/17 1434 80     Resp 10/11/17 1434 14     Temp 10/11/17 1434 98.6 F (37 C)     Temp  Source 10/11/17 1434 Oral     SpO2 10/11/17 1434 100 %     Weight 10/11/17 1438 165 lb (74.8 kg)     Height 10/11/17 1438 5\' 1"  (1.549 m)    Constitutional: Alert but confused and forgetting parts of out conversation from earlier.  Eyes: Conjunctivae are normal. PERRL. EOMI. Head: Atraumatic. Nose: No congestion/rhinnorhea. Mouth/Throat: Mucous membranes are moist.  Neck: No stridor.   Cardiovascular: Normal rate, regular rhythm. Good peripheral circulation. Grossly normal heart sounds.   Respiratory: Normal respiratory effort.  No retractions. Lungs CTAB. Gastrointestinal: Soft and nontender. No distention.  Musculoskeletal: No lower extremity tenderness nor edema. No gross deformities of extremities. Neurologic:  Normal speech and language. No gross focal neurologic deficits are appreciated. Normal CN exam 2-12.  Skin:  Skin is warm, dry and intact. No rash  noted.   ____________________________________________   LABS (all labs ordered are listed, but only abnormal results are displayed)  Labs Reviewed  COMPREHENSIVE METABOLIC PANEL - Abnormal; Notable for the following components:      Result Value   Creatinine, Ser 1.01 (*)    Calcium 11.0 (*)    Alkaline Phosphatase 37 (*)    Total Bilirubin 1.6 (*)    GFR calc non Af Amer 56 (*)    All other components within normal limits  CBC WITH DIFFERENTIAL/PLATELET - Abnormal; Notable for the following components:   RBC 3.43 (*)    Hemoglobin 11.0 (*)    HCT 31.9 (*)    All other components within normal limits  URINALYSIS, ROUTINE W REFLEX MICROSCOPIC - Abnormal; Notable for the following components:   Ketones, ur 5 (*)    All other components within normal limits  LIPASE, BLOOD  VITAMIN B12  TSH  AMMONIA  RAPID URINE DRUG SCREEN, HOSP PERFORMED  RPR  BASIC METABOLIC PANEL  CBC  HEMOGLOBIN A1C  LIPID PANEL   ____________________________________________  EKG   EKG Interpretation  Date/Time:  Wednesday October 11 2017 14:33:14 EST Ventricular Rate:  82 PR Interval:    QRS Duration: 83 QT Interval:  379 QTC Calculation: 443 R Axis:   52 Text Interpretation:  Sinus rhythm Probable left atrial enlargement No STEMI.  Confirmed by Nanda Quinton [phone]) on 10/11/2017 3:18:48 PM       ____________________________________________  RADIOLOGY  Ct Head Wo Contrast  Result Date: 10/11/2017 CLINICAL DATA:  Altered level of consciousness EXAM: CT HEAD WITHOUT CONTRAST TECHNIQUE: Contiguous axial images were obtained from the base of the skull through the vertex without intravenous contrast. COMPARISON:  July 01, 2017 FINDINGS: Brain: Age related volume loss is stable. There is no intracranial mass, hemorrhage, extra-axial fluid collection, or midline shift. There is patchy small vessel disease in the centra semiovale bilaterally. Elsewhere gray-white compartments appear normal.  There is no evident acute infarct. Vascular: No hyperdense vessel. There is modest calcification in the carotid siphon regions bilaterally. Skull: Bony calvarium appears intact. Sinuses/Orbits: There is slight mucosal thickening and anterior ethmoid air cells. Other visualized paranasal sinuses are clear. Orbits appear symmetric and unremarkable bilaterally. Other: Mastoid air cells are clear. IMPRESSION: Age related volume loss with patchy periventricular small vessel disease, stable. No acute infarct evident. No mass or hemorrhage. Modest arterial vascular calcification noted. Mild mucosal thickening in anterior ethmoid air cells. Electronically Signed   By: Lowella Grip III M.D.   On: 10/11/2017 15:45    ____________________________________________   PROCEDURES  Procedure(s) performed:   Procedures  None ____________________________________________   INITIAL IMPRESSION /  ASSESSMENT AND PLAN / ED COURSE  Pertinent labs & imaging results that were available during my care of the patient were reviewed by me and considered in my medical decision making (see chart for details).  Patient presents to the emergency department for evaluation of acute on chronic confusion.  Symptoms have been intermittent over the last 6 months but worsening over the past 24 hours.  Patient has no focal neurological deficits.  CT scan of the head shows no acute abnormality.  The patient complaining of some left-sided weakness at her PCP but she denies that currently.  Patient is awake, alert, and providing most of the history but seems to be confabulating frequently and does not recall recent conversations or parts of her own history. I am concerned that perhaps she did have some unilateral weakness earlier which is clearly reported on arrival to the ED. Given this and worsening confusion plan for admission for MRI and TIA evaluation. UA pending.  Discussed patient's case with Hospitalist to request admission.  Patient and family (if present) updated with plan. Care transferred to Hospitalist service.  I reviewed all nursing notes, vitals, pertinent old records, EKGs, labs, imaging (as available).  ____________________________________________  FINAL CLINICAL IMPRESSION(S) / ED DIAGNOSES  Final diagnoses:  Transient alteration of awareness     MEDICATIONS GIVEN DURING THIS VISIT:  Medications  diclofenac sodium (VOLTAREN) 1 % transdermal gel 1 application (not administered)  hydrALAZINE (APRESOLINE) tablet 50 mg (not administered)  irbesartan (AVAPRO) tablet 300 mg (not administered)  magnesium oxide (MAG-OX) tablet 400 mg (not administered)  nebivolol (BYSTOLIC) tablet 10 mg (not administered)  enoxaparin (LOVENOX) injection 40 mg (not administered)  lactated ringers infusion (not administered)   stroke: mapping our early stages of recovery book (not administered)  acetaminophen (TYLENOL) tablet 650 mg (not administered)  sodium chloride 0.9 % bolus 500 mL (0 mLs Intravenous Stopped 10/11/17 1646)    Note:  This document was prepared using Dragon voice recognition software and may include unintentional dictation errors.  Nanda Quinton, MD Emergency Medicine   Long, [facility], MD 10/11/17 [phone]

## 2017-10-11 NOTE — ED Triage Notes (Signed)
Per EMS, patient coming from [facility] with complaint of increasing confusion.  Been having confusion since summer but for the last 4-5 days it has gotten worse.  No acute changes x 24 hours.  PT this morning stated that she is having left sided weakness but doctor at [facility] and EMS did not see any deficits.  NIH 0.  Hx of hypertension.  Right mastectomy x 18 years. 182/88, 84 HR, 100% RA, CBG 117, 17 RR.  EKG unremarkable.

## 2017-10-11 NOTE — ED Notes (Signed)
On way to CT 

## 2017-10-12 ENCOUNTER — Inpatient Hospital Stay (HOSPITAL_COMMUNITY): Payer: BLUE CROSS/BLUE SHIELD

## 2017-10-12 DIAGNOSIS — I160 Hypertensive urgency: Secondary | ICD-10-CM | POA: Diagnosis not present

## 2017-10-12 DIAGNOSIS — R404 Transient alteration of awareness: Secondary | ICD-10-CM | POA: Diagnosis not present

## 2017-10-12 DIAGNOSIS — R4182 Altered mental status, unspecified: Secondary | ICD-10-CM | POA: Diagnosis not present

## 2017-10-12 DIAGNOSIS — I361 Nonrheumatic tricuspid (valve) insufficiency: Secondary | ICD-10-CM

## 2017-10-12 LAB — LIPID PANEL
Cholesterol: 195 mg/dL (ref 0–200)
HDL: 40 mg/dL — ABNORMAL LOW (ref >40)
LDL CALC: 126 mg/dL — AB (ref 0–99)
TRIGLYCERIDES: 145 mg/dL (ref <150)
Total CHOL/HDL Ratio: 4.9 RATIO
VLDL: 29 mg/dL (ref 0–40)

## 2017-10-12 LAB — RPR: RPR Ser Ql: NONREACTIVE

## 2017-10-12 LAB — HEMOGLOBIN A1C
HEMOGLOBIN A1C: 4.2 % — AB (ref 4.8–5.6)
Mean Plasma Glucose: 73.84 mg/dL

## 2017-10-12 LAB — CBC
HCT: 30.7 % — ABNORMAL LOW (ref 36.0–46.0)
Hemoglobin: 10.6 g/dL — ABNORMAL LOW (ref 12.0–15.0)
MCH: 32.2 pg (ref 26.0–34.0)
MCHC: 34.5 g/dL (ref 30.0–36.0)
MCV: 93.3 fL (ref 78.0–100.0)
PLATELETS: 244 10*3/uL (ref 150–400)
RBC: 3.29 MIL/uL — AB (ref 3.87–5.11)
RDW: 13.6 % (ref 11.5–15.5)
WBC: 5.7 10*3/uL (ref 4.0–10.5)

## 2017-10-12 LAB — BASIC METABOLIC PANEL
ANION GAP: 10 (ref 5–15)
BUN: 11 mg/dL (ref 6–20)
CHLORIDE: 105 mmol/L (ref 101–111)
CO2: 25 mmol/L (ref 22–32)
Calcium: 12.7 mg/dL — ABNORMAL HIGH (ref 8.9–10.3)
Creatinine, Ser: 0.93 mg/dL (ref 0.44–1.00)
GFR calc Af Amer: >60 mL/min (ref >60)
GFR calc non Af Amer: >60 mL/min (ref >60)
GLUCOSE: 88 mg/dL (ref 65–99)
POTASSIUM: 2.8 mmol/L — AB (ref 3.5–5.1)
Sodium: 140 mmol/L (ref 135–145)

## 2017-10-12 LAB — ECHOCARDIOGRAM COMPLETE
HEIGHTINCHES: 61 in
Weight: 2640 oz

## 2017-10-12 MED ORDER — KCL IN DEXTROSE-NACL 40-5-0.45 MEQ/L-%-% IV SOLN
INTRAVENOUS | Status: DC
  Administered 2017-10-12: 14:00:00 via INTRAVENOUS
  Filled 2017-10-12 (×3): qty 1000

## 2017-10-12 MED ORDER — ISOSORBIDE MONONITRATE ER 30 MG PO TB24
30.0000 mg | ORAL_TABLET | Freq: Every day | ORAL | Status: DC
  Administered 2017-10-12: 30 mg via ORAL
  Filled 2017-10-12: qty 1

## 2017-10-12 MED ORDER — HYDRALAZINE HCL 20 MG/ML IJ SOLN
10.0000 mg | Freq: Once | INTRAMUSCULAR | Status: AC
  Administered 2017-10-12: 10 mg via INTRAVENOUS
  Filled 2017-10-12: qty 1

## 2017-10-12 MED ORDER — ONDANSETRON HCL 4 MG/2ML IJ SOLN
4.0000 mg | Freq: Four times a day (QID) | INTRAMUSCULAR | Status: DC | PRN
  Administered 2017-10-12: 4 mg via INTRAVENOUS
  Filled 2017-10-12: qty 2

## 2017-10-12 NOTE — Progress Notes (Signed)
EEG Completed; Results Pending  

## 2017-10-12 NOTE — Care Management Note (Signed)
Case Management Note  Patient Details  Name: Sarah Cook MRN: [id number] Date of Birth: Apr 05, 1951  Subjective/Objective:    Pt admitted with altered mental status. She is from home with spouse.              Action/Plan: OT recommending SNF. Awaiting PT eval. CM following for d/c disposition.   Expected Discharge Date:                  Expected Discharge Plan:  Skilled Nursing Facility  In-House Referral:  Clinical Social Work  Discharge planning Services     Post Acute Care Choice:    Choice offered to:     DME Arranged:    DME Agency:     HH Arranged:    [place] Agency:     Status of Service:  In process, will continue to follow  If discussed at Long Length of Stay Meetings, dates discussed:    Additional Comments:  Pollie Friar, RN 10/12/2017, 11:21 AM

## 2017-10-12 NOTE — Progress Notes (Signed)
  Echocardiogram 2D Echocardiogram has been performed.  Sarah Cook Sarah Cook 10/12/2017, 11:01 AM

## 2017-10-12 NOTE — Evaluation (Signed)
Physical Therapy Evaluation Patient Details Name: Sarah Cook MRN: [id number] DOB: [birth year] Today's Date: 10/12/2017   History of Present Illness  66 y.o. female with PMH of HLD, HTN, and TIA presents to the emergency department for evaluation of increasing confusion.  Patient has had worsening confusion over the past 6 months but the husband states she has been more confused over the past 24 hours. Fall from ladder in August of 2018 with resulting tibial plateau fx. Hx of total Elbow replacement and R mastectomy.   Clinical Impression  Pt admitted with above diagnosis. Pt currently with functional limitations due to the deficits listed below (see PT Problem List).  Pt will benefit from skilled PT to increase their independence and safety with mobility to allow discharge to the venue listed below.    PTA, pt lived at home with husband and was actively receiving HHPT, although pt ambulated without an AD and required (per pt husband); Husband states pt has developed increased confusion/lethargy over the last week and currently requires Max A with bed mobility; recommend SNF  At this time; If cognition improves, feel pt mobility would be significantly better as well; will follow in acute setting    Follow Up Recommendations Supervision/Assistance - 24 hour;SNF    Equipment Recommendations  None recommended by PT    Recommendations for Other Services       Precautions / Restrictions Precautions Precautions: Fall Restrictions Weight Bearing Restrictions: No      Mobility  Bed Mobility Overal bed mobility: Needs Assistance Bed Mobility: Sit to Supine;Supine to Sit     Supine to sit: Max assist Sit to supine: +2 for physical assistance;Mod assist   General bed mobility comments: assist to bring trunk to upright and LEs off bed; assist  with trunk descent, LEs on bed  return to supine, total assist to scoot up in bed, bed pad utilized   Transfers                 General  transfer comment: too lethargic to attempt safely  Ambulation/Gait                Stairs            Wheelchair Mobility    Modified Rankin (Stroke Patients Only)       Balance Overall balance assessment: Needs assistance Sitting-balance support: Feet supported;No upper extremity supported Sitting balance-Leahy Scale: Poor Sitting balance - Comments: poor to fair, able to maintain static sit on EOB intermittently, occasional LOB to Left Postural control: Left lateral lean     Standing balance comment: NT (poor with OT this am)                             Pertinent Vitals/Pain Pain Assessment: Faces Faces Pain Scale: Hurts even more Pain Location: pt grimaces with touch/movement Pain Descriptors / Indicators: Discomfort;Moaning;Guarding;Grimacing Pain Intervention(s): Limited activity within patient's tolerance;Monitored during session    Home Living Family/patient expects to be discharged to:: Private residence Living Arrangements: Spouse/significant other Available Help at Discharge: Family;Personal care attendant;Available 24 hours/day Type of Home: House Home Access: Stairs to enter   [employer] of Steps: 3 and 1  Home Layout: One level Home Equipment: Walker - 2 wheels;Cane - single point;Shower seat;Wheelchair - manual Additional Comments: husband works from home 4 days per wk and outside the home on Wednesdays    Prior Function     Gait / [place]  Needed: independent ambulator although pt was having HHPT PTA  ADL's / Homemaking Assistance Needed: husband assisted wife into shower then wife bathed herself        Hand Dominance        Extremity/Trunk Assessment   Upper Extremity Assessment Upper Extremity Assessment: Defer to OT evaluation    Lower Extremity Assessment Lower Extremity Assessment: Difficult to assess due to impaired cognition;Generalized weakness;RLE deficits/detail;LLE  deficits/detail RLE Deficits / Details: AROM grossly WFL, strength testing limited by cognition, muscle tone appears normal  LLE Deficits / Details: same as above       Communication   Communication: Expressive difficulties(d/t lethargy)  Cognition Arousal/Alertness: Lethargic--arouses to touch, voice, cold cloth but unable to maintain eyes open  Greater than ~3sec Behavior During Therapy: Flat affect Overall Cognitive Status: Impaired/Different from baseline Area of Impairment: Orientation;Attention;Memory;Following commands;Safety/judgement;Awareness;Problem solving                 Orientation Level: Disoriented to;Time;Situation;Place Current Attention Level: Focused Memory: Decreased short-term memory Following Commands: Follows one step commands inconsistently Safety/Judgement: Decreased awareness of safety;Decreased awareness of deficits Awareness: Intellectual Problem Solving: Slow processing;Decreased initiation;Difficulty sequencing;Requires verbal cues;Requires tactile cues General Comments: Pt very lethargic. Difficulty maintaining eyes open although is able to sit EOB with supervision and states her husbands name      General Comments      Exercises     Assessment/Plan    PT Assessment Patient needs continued PT services  PT Problem List Decreased mobility;Decreased activity tolerance;Decreased balance;Decreased cognition;Decreased knowledge of use of DME;Decreased safety awareness;Pain       PT Treatment Interventions Functional mobility training;Therapeutic activities;Therapeutic exercise;Patient/family education;Balance training;Gait training;DME instruction    PT Goals (Current goals can be found in the Care Plan section)  Acute Rehab PT Goals Patient Stated Goal: per husband - to find out what is wrong PT Goal Formulation: With patient Time For Goal Achievement: 10/26/17 Potential to Achieve Goals: Fair    Frequency Min 2X/week   Barriers to  discharge Decreased caregiver support      Co-evaluation               AM-PAC PT "6 Clicks" Daily Activity  Outcome Measure Difficulty turning over in bed (including adjusting bedclothes, sheets and blankets)?: Unable Difficulty moving from lying on back to sitting on the side of the bed? : Unable Difficulty sitting down on and standing up from a chair with arms (e.g., wheelchair, bedside commode, etc,.)?: Unable Help needed moving to and from a bed to chair (including a wheelchair)?: Total Help needed walking in hospital room?: Total Help needed climbing 3-5 steps with a railing? : Total 6 Click Score: 6    End of Session   Activity Tolerance: Patient limited by lethargy Patient left: in bed;with call bell/phone within reach;with family/visitor present;with bed alarm set   PT Visit Diagnosis: History of falling (Z91.81);Muscle weakness (generalized) (M62.81)    Time: 2585-2778 PT Time Calculation (min) (ACUTE ONLY): 21 min   Charges:   PT Evaluation $PT Eval Moderate Complexity: 1 Mod     PT G Codes:          Sarah Cook Oct 24, 2017, 4:03 PM

## 2017-10-12 NOTE — Progress Notes (Signed)
PROGRESS NOTE    Sarah Cook  NTI:[id number] DOB: [birth year] DOA: 10/11/2017 PCP: Cari Caraway, MD   Brief Narrative:   66 y.o. female with medical history significant of HTN, h/o breast cancer, hyperparathyroidism and recent fall from >6 feet with resultant tibial plateau fracture.  She recently retired after working at [employer].  She reports that she had has problems since she fell off her ladder in August- She fell > 6 feet unwitnessed and thinks she may have hit her head.   Assessment & Plan:    Altered mental status/Metabolic encephlopathy - Most likely secondary to elevated blood pressures. We'll plan on adding to help bring down blood pressure.  - discussed with Neurology who felt most likely cause at this juncture was Elevated blood pressures  Active Problems:   Hyperparathyroidism (HCC) - TSH WNL    TIA (transient ischemic attack) - MRI negative, do not suspect TIA    DVT prophylaxis: Lovenox Code Status: Full Family Communication: d/c spouse Disposition Plan: Pending improvement in mental status   Consultants:   D/c neurology over phone   Procedures: none   Antimicrobials: None   Subjective: Pt has been somnolent and was resting during my visit  Objective: Vitals:   10/12/17 0027 10/12/17 0122 10/12/17 0449 10/12/17 1257  BP: (!) 207/71 (!) 185/75 (!) 190/93 (!) 163/71  Pulse: 92 (!) 103 (!) 102 93  Resp: 16 18 18 17   Temp: 98.7 F (37.1 C) 97.6 F (36.4 C) 98.5 F (36.9 C) 98.8 F (37.1 C)  TempSrc: Oral Oral Oral Oral  SpO2: 95% 95% 96% 94%  Weight:      Height:        Intake/Output Summary (Last 24 hours) at 10/12/2017 1604 Last data filed at 10/12/2017 1000 Gross per 24 hour  Intake 838.75 ml  Output -  Net 838.75 ml   Filed Weights   10/11/17 1438  Weight: 74.8 kg (165 lb)    Examination:  General exam: Appears calm and comfortable, resting  Respiratory system: Clear to auscultation. Respiratory effort normal. Equal chest  rise. Cardiovascular system: S1 & S2 heard, RRR. No JVD, murmurs Gastrointestinal system: Abdomen is nondistended, soft and nontender. No organomegaly or masses felt. Normal bowel sounds heard. Central nervous system: Pt is hypersomnolent unable to do full exam, but arrousable Extremities: no deformities, warm Skin: No rashes, lesions or ulcers, on limited exam. Psychiatry: pt resting and hypersomnolent, unable to assess  Data Reviewed: I have personally reviewed following labs and imaging studies  CBC: Recent Labs  Lab 10/11/17 1518 10/12/17 0241  WBC 6.0 5.7  NEUTROABS 3.8  --   HGB 11.0* 10.6*  HCT 31.9* 30.7*  MCV 93.0 93.3  PLT 258 867   Basic Metabolic Panel: Recent Labs  Lab 10/11/17 1518 10/12/17 0241  NA 138 140  K 3.9 2.8*  CL 105 105  CO2 23 25  GLUCOSE 92 88  BUN 13 11  CREATININE 1.01* 0.93  CALCIUM 11.0* 12.7*   GFR: Estimated Creatinine Clearance: 54.3 mL/min (by C-G formula based on SCr of 0.93 mg/dL). Liver Function Tests: Recent Labs  Lab 10/11/17 1518  AST 32  ALT 14  ALKPHOS 37*  BILITOT 1.6*  PROT 6.5  ALBUMIN 3.7   Recent Labs  Lab 10/11/17 1518  LIPASE 29   Recent Labs  Lab 10/11/17 1812  AMMONIA 10   Coagulation Profile: No results for input(s): INR, PROTIME in the last 168 hours. Cardiac Enzymes: No results for input(s): CKTOTAL, CKMB,  CKMBINDEX, TROPONINI in the last 168 hours. BNP (last 3 results) No results for input(s): PROBNP in the last 8760 hours. HbA1C: Recent Labs    10/12/17 0241  HGBA1C 4.2*   CBG: No results for input(s): GLUCAP in the last 168 hours. Lipid Profile: Recent Labs    10/12/17 0241  CHOL 195  HDL 40*  LDLCALC 126*  TRIG 145  CHOLHDL 4.9   Thyroid Function Tests: Recent Labs    10/11/17 1812  TSH 1.084   Anemia Panel: Recent Labs    10/11/17 1812  VITAMINB12 425   Sepsis Labs: No results for input(s): PROCALCITON, LATICACIDVEN in the last 168 hours.  No results found for  this or any previous visit (from the past 240 hour(s)).       Radiology Studies: Ct Head Wo Contrast  Result Date: 10/11/2017 CLINICAL DATA:  Altered level of consciousness EXAM: CT HEAD WITHOUT CONTRAST TECHNIQUE: Contiguous axial images were obtained from the base of the skull through the vertex without intravenous contrast. COMPARISON:  July 01, 2017 FINDINGS: Brain: Age related volume loss is stable. There is no intracranial mass, hemorrhage, extra-axial fluid collection, or midline shift. There is patchy small vessel disease in the centra semiovale bilaterally. Elsewhere gray-white compartments appear normal. There is no evident acute infarct. Vascular: No hyperdense vessel. There is modest calcification in the carotid siphon regions bilaterally. Skull: Bony calvarium appears intact. Sinuses/Orbits: There is slight mucosal thickening and anterior ethmoid air cells. Other visualized paranasal sinuses are clear. Orbits appear symmetric and unremarkable bilaterally. Other: Mastoid air cells are clear. IMPRESSION: Age related volume loss with patchy periventricular small vessel disease, stable. No acute infarct evident. No mass or hemorrhage. Modest arterial vascular calcification noted. Mild mucosal thickening in anterior ethmoid air cells. Electronically Signed   By: Lowella Grip III M.D.   On: 10/11/2017 15:45   Mr Brain Wo Contrast  Result Date: 10/11/2017 CLINICAL DATA:  [place] 6 feet from ladder in August resulting in leg fracture, possible head injury. Altered mental status. History of breast cancer, hypertension, hyperlipidemia. EXAM: MRI HEAD WITHOUT CONTRAST TECHNIQUE: Multiplanar, multiecho pulse sequences of the brain and surrounding structures were obtained without intravenous contrast. COMPARISON:  CT HEAD October 11, 2017 at 1535 hours and MRI of the head April 20, 2016 FINDINGS: BRAIN: No reduced diffusion to suggest acute ischemia or hypercellular tumor. Numerous prominent  posterior cerebrum and cerebellar chronic micro hemorrhages. RIGHT occipital lobe FLAIR T2 hyperintense signal in early encephalomalacia mild prominence of ventricles and sulci. No hydrocephalus. Patchy to confluent supratentorial white matter and pontine FLAIR T2 hyperintensities, progressed from prior MRI. Prominent bilateral basal ganglia perivascular spaces associated with chronic small vessel ischemic disease. No midline shift, mass effect or masses. No abnormal extra-axial fluid collections. VASCULAR: Normal major intracranial vascular flow voids present at skull base. SKULL AND UPPER CERVICAL SPINE: No abnormal sellar expansion. No suspicious calvarial bone marrow signal. Craniocervical junction maintained. SINUSES/ORBITS: Trace LEFT mastoid effusion. Imaged paranasal sinuses are well-aerated. The included ocular globes and orbital contents are non-suspicious. OTHER: None. IMPRESSION: 1. No acute intracranial process. 2. Small area RIGHT occipital lobe early encephalomalacia most compatible with contusion , less likely PCA stroke. 3. 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 4. Mild parenchymal brain volume loss. Moderate chronic small vessel ischemic disease. Electronically Signed   By: Elon Alas M.D.   On: 10/11/2017 22:30        Scheduled Meds: .  enoxaparin (LOVENOX) injection  40 mg Subcutaneous Q24H  . hydrALAZINE  50 mg Oral BID  . irbesartan  300 mg Oral Daily  . isosorbide mononitrate  30 mg Oral Daily  . magnesium oxide  400 mg Oral Daily  . nebivolol  10 mg Oral Daily   Continuous Infusions: . dextrose 5 % and 0.45 % NaCl with KCl 40 mEq/L    . lactated ringers 75 mL/hr at 10/12/17 0529     LOS: 1 day    Time spent: > 35 minutes  Velvet Bathe, MD [facility] Pager [phone]  If 7PM-7AM, please contact  night-coverage [email or web address] Password [email or web address] 10/12/2017, 4:04 PM

## 2017-10-12 NOTE — Progress Notes (Signed)
Occupational Therapy Evaluation Patient Details Name: Sarah Cook MRN: [id number] DOB: 1951/03/17 Today's Date: 10/12/2017    History of Present Illness 66 y.o. female with PMH of HLD, HTN, and TIA presents to the emergency department for evaluation of increasing confusion.  Patient has had worsening confusion over the past 6 months but the husband states she has been more confused over the past 24 hours. Fall from ladder in August of 1028 with resulting tibial plateau fx. Hx of total Elbow replacement and R mastectomy.    Clinical Impression   PTA, pt lives at home with husband and was actively receiving HHPT, although pt ambulated without an AD and required set up/S with ADL tasks. Pt states pt has developed increased confusion/lethargy over the last week and currently requires Max A with ADL and mod A with stand pivot transfers. Pt extremely lethargic and apparently confused during assessment. Currently recommend SNF for rehab. Will follow acutely to address established goals and facilitate DC to appropriate DC venue.     Follow Up Recommendations  SNF;Supervision/Assistance - 24 hour    Equipment Recommendations  3 in 1 bedside commode    Recommendations for Other Services PT consult;Speech consult     Precautions / Restrictions Precautions Precautions: Fall Restrictions Weight Bearing Restrictions: No      Mobility Bed Mobility Overal bed mobility: Needs Assistance Bed Mobility: Sit to Supine       Sit to supine: Mod assist   General bed mobility comments: A to lift legs onto bed  Transfers Overall transfer level: Needs assistance   Transfers: Sit to/from Stand;Stand Pivot Transfers Sit to Stand: Mod assist Stand pivot transfers: Mod assist       General transfer comment: unsteady. REquiring physical A to prevent fall and guide pt to toilet/bed    Balance Overall balance assessment: Needs assistance   Sitting balance-Leahy Scale: Poor       Standing  balance-Leahy Scale: Poor                             ADL either performed or assessed with clinical judgement   ADL Overall ADL's : Needs assistance/impaired Eating/Feeding: Supervision/ safety Eating/Feeding Details (indicate cue type and reason): Educated pt's husbnad on not trying to feed pt while she is so lethargic, Husband verbalized understanding. Grooming: Maximal assistance Grooming Details (indicate cue type and reason): mostly due to lethargy and attneitonal deficits Upper Body Bathing: Maximal assistance;Sitting   Lower Body Bathing: Maximal assistance;Sit to/from stand   Upper Body Dressing : Maximal assistance   Lower Body Dressing: Maximal assistance;Sit to/from stand   Toilet Transfer: Moderate assistance;BSC;Stand-pivot   Toileting- Clothing Manipulation and Hygiene: Moderate assistance;Sitting/lateral lean Toileting - Clothing Manipulation Details (indicate cue type and reason): Tactile cues to copmlete toilet hygiene. Pt attempting to help with clothing manipulation     Functional mobility during ADLs: Moderate assistance;Cueing for safety;Cueing for sequencing       Vision Baseline Vision/History: Wears glasses Additional Comments: unable to assess due to lethargy     Perception     Praxis      Pertinent Vitals/Pain Pain Assessment: Faces Faces Pain Scale: Hurts whole lot Pain Location: head Pain Descriptors / Indicators: Discomfort;Moaning;Guarding;Grimacing Pain Intervention(s): Limited activity within patient's tolerance     Hand Dominance Right   Extremity/Trunk Assessment Upper Extremity Assessment Upper Extremity Assessment: Overall WFL for tasks assessed(L appears slightly weaker than R)   Lower Extremity Assessment Lower Extremity Assessment:  Defer to PT evaluation   Cervical / Trunk Assessment Cervical / Trunk Assessment: Kyphotic   Communication Communication Communication: Expressive difficulties   Cognition  Arousal/Alertness: Lethargic Behavior During Therapy: Flat affect Overall Cognitive Status: Impaired/Different from baseline Area of Impairment: Orientation;Attention;Memory;Following commands;Safety/judgement;Awareness;Problem solving                 Orientation Level: Disoriented to;Time;Situation Current Attention Level: Focused Memory: Decreased short-term memory Following Commands: Follows one step commands inconsistently Safety/Judgement: Decreased awareness of safety;Decreased awareness of deficits Awareness: Intellectual Problem Solving: Slow processing;Decreased initiation;Difficulty sequencing;Requires verbal cues;Requires tactile cues General Comments: Pt very lethargic. Difficulty maintaining eyes open   General Comments       Exercises     Shoulder Instructions      Home Living Family/patient expects to be discharged to:: Private residence Living Arrangements: Spouse/significant other Available Help at Discharge: Family;Personal care attendant;Available 24 hours/day(Husbnad works on Wednesdays) Type of Home: House Home Access: Stairs to enter [employer] of Steps: 2 Entrance Stairs-Rails: None Home Layout: One level     Bathroom Shower/Tub: Teacher, early years/pre: Standard Bathroom Accessibility: Yes How Accessible: Accessible via walker Home Equipment: [place] - 2 wheels;Cane - single point;Shower seat;Wheelchair - manual          Prior Functioning/Environment Level of Independence: Needs assistance  Gait / Transfers Assistance Needed: independent although pt was having HHPT PTA ADL's / Homemaking Assistance Needed: husband assisted wife into shower then wife bathed herself   Comments: Had PCA 1x/wk while husband went to work; pt recently retured from [place] List: Decreased strength;Decreased activity tolerance;Impaired balance (sitting and/or standing);Impaired vision/perception;Decreased  coordination;Decreased cognition;Decreased safety awareness;Decreased knowledge of use of DME or AE;Pain      OT Treatment/Interventions: Self-care/ADL training;Therapeutic exercise;Energy conservation;DME and/or AE instruction;Therapeutic activities;Cognitive remediation/compensation;Visual/perceptual remediation/compensation;Patient/family education;Balance training    OT Goals(Current goals can be found in the care plan section) Acute Rehab OT Goals Patient Stated Goal: per husband - to find out what is wrong OT Goal Formulation: With patient/family Time For Goal Achievement: 10/26/17 Potential to Achieve Goals: Fair  OT Frequency: Min 2X/week   Barriers to D/C:            Co-evaluation              AM-PAC PT "6 Clicks" Daily Activity     Outcome Measure Help from another person eating meals?: A Little Help from another person taking care of personal grooming?: A Lot Help from another person toileting, which includes using toliet, bedpan, or urinal?: A Lot Help from another person bathing (including washing, rinsing, drying)?: A Lot Help from another person to put on and taking off regular upper body clothing?: A Lot Help from another person to put on and taking off regular lower body clothing?: A Lot 6 Click Score: 13   End of Session Nurse Communication: Mobility status;Other (comment)(level of arousal)  Activity Tolerance: Patient limited by lethargy Patient left: in chair;with call bell/phone within reach;with chair alarm set;with family/visitor present  OT Visit Diagnosis: Unsteadiness on feet (R26.81);Muscle weakness (generalized) (M62.81);History of falling (Z91.81);Other symptoms and signs involving cognitive function;Pain Pain - part of body: (Head)                Time: 3220-2542 OT Time Calculation (min): 30 min Charges:  OT General Charges $OT Visit: 1 Visit OT Evaluation $OT Eval Moderate Complexity: 1 Mod OT Treatments $Self Care/Home Management : 8-22  mins G-Codes:  [facility], OR/L  [phone] 10/12/2017  Saramarie Stinger,HILLARY 10/12/2017, 10:16 AM

## 2017-10-12 NOTE — Procedures (Signed)
ELECTROENCEPHALOGRAM REPORT  Date of Study: 10/12/2017  Patient's Name: Sarah Cook MRN: [id number] Date of Birth: 1950-12-06  Referring Provider: Eulogio Bear, DO  Clinical History: 66 year old female with confusion  Medications: Acetaminophen Hydralazine Irbesartan nebivolol Ondansetron  Technical Summary: A multichannel digital EEG recording measured by the international 10-20 system with electrodes applied with paste and impedances below 5000 ohms performed as portable with EKG monitoring in an awake and drowsy patient.  Hyperventilation and photic stimulation were not performed.  The digital EEG was referentially recorded, reformatted, and digitally filtered in a variety of bipolar and referential montages for optimal display.   Description: The patient is awake and drowsy during the recording.  During maximal wakefulness, there is a symmetric, medium voltage 7 Hz posterior dominant rhythm that attenuates with eye opening. This is admixed with diffuse 4-5 Hz theta and mild amount of 2-3 Hz delta slowing of the waking background.  Stage 2 sleep is not seen.  There were no epileptiform discharges or electrographic seizures seen.    EKG lead was unremarkable.  Impression: This awake and drowsy EEG is abnormal due to diffuse slowing of the waking background.  Clinical Correlation of the above findings indicates diffuse cerebral dysfunction that is non-specific in etiology and can be seen with hypoxic/ischemic injury, toxic/metabolic encephalopathies, neurodegenerative disorders, or medication effect.  The absence of epileptiform discharges does not rule out a clinical diagnosis of epilepsy.  Clinical correlation is advised.   Metta Clines, DO

## 2017-10-13 DIAGNOSIS — R404 Transient alteration of awareness: Secondary | ICD-10-CM | POA: Diagnosis not present

## 2017-10-13 LAB — BASIC METABOLIC PANEL
ANION GAP: 9 (ref 5–15)
BUN: 11 mg/dL (ref 6–20)
CHLORIDE: 107 mmol/L (ref 101–111)
CO2: 23 mmol/L (ref 22–32)
Calcium: 10.8 mg/dL — ABNORMAL HIGH (ref 8.9–10.3)
Creatinine, Ser: 0.89 mg/dL (ref 0.44–1.00)
GFR calc non Af Amer: >60 mL/min (ref >60)
Glucose, Bld: 136 mg/dL — ABNORMAL HIGH (ref 65–99)
Potassium: 3.9 mmol/L (ref 3.5–5.1)
SODIUM: 139 mmol/L (ref 135–145)

## 2017-10-13 LAB — FOLATE: FOLATE: 7.9 ng/mL (ref >5.9)

## 2017-10-13 LAB — VITAMIN B12: VITAMIN B 12: 385 pg/mL (ref 180–914)

## 2017-10-13 MED ORDER — METOPROLOL TARTRATE 5 MG/5ML IV SOLN
5.0000 mg | INTRAVENOUS | Status: AC | PRN
  Administered 2017-10-13 (×2): 5 mg via INTRAVENOUS
  Filled 2017-10-13 (×2): qty 5

## 2017-10-13 MED ORDER — HYDRALAZINE HCL 20 MG/ML IJ SOLN
10.0000 mg | Freq: Four times a day (QID) | INTRAMUSCULAR | Status: AC | PRN
  Administered 2017-10-13 (×2): 10 mg via INTRAVENOUS
  Filled 2017-10-13 (×2): qty 1

## 2017-10-13 MED ORDER — KCL IN DEXTROSE-NACL 20-5-0.45 MEQ/L-%-% IV SOLN
INTRAVENOUS | Status: DC
  Administered 2017-10-14 – 2017-10-15 (×5): via INTRAVENOUS
  Administered 2017-10-16: 1000 mL via INTRAVENOUS
  Administered 2017-10-17: 07:00:00 via INTRAVENOUS
  Filled 2017-10-13 (×10): qty 1000

## 2017-10-13 MED ORDER — HYDRALAZINE HCL 20 MG/ML IJ SOLN
10.0000 mg | Freq: Four times a day (QID) | INTRAMUSCULAR | Status: AC | PRN
  Administered 2017-10-13 – 2017-10-14 (×2): 10 mg via INTRAVENOUS
  Filled 2017-10-13 (×3): qty 1

## 2017-10-13 MED ORDER — ISOSORBIDE MONONITRATE ER 60 MG PO TB24: 60.0000 mg | ORAL_TABLET | Freq: Every day | ORAL | Status: DC

## 2017-10-13 NOTE — NC FL2 (Signed)
[place] LEVEL OF CARE SCREENING TOOL     IDENTIFICATION  Patient Name: Sarah Cook Birthdate: 10-02-1951 Sex: female Admission Date (Current Location): 10/11/2017  [facility] and [place] Number:  Herbalist and Address:  [facility] [place], [place] [address], [place], [place] [address]      Provider Number: [id number]  Attending Physician Name and Address:  Velvet Bathe, MD  Relative Name and Phone Number:  Ollen Gross, spouse, [phone]    Current Level of Care: Hospital Recommended Level of Care: [place] Prior Approval Number:    Date Approved/Denied:   PASRR Number: [id number]  Discharge Plan: SNF    Current Diagnoses: Patient Active Problem List   Diagnosis Date Noted  . Altered mental status 10/11/2017  . 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[place]) 03/09/2016  . Chest pain 25-Feb-2016  . History of antineoplastic chemotherapy with cardiotoxic drugs 2016-02-25  . Family history of sudden cardiac death in father 25-Feb-2016  . Breast cancer ([place])   . Hemorrhoids   . Hiatal hernia   . Pulmonary nodules   . Hyperparathyroidism ([place])   . Hypertension   . Hyperlipidemia   . Heart disease   . Allergy   . Anxiety   . TIA (transient ischemic attack)   . Cataract   . Osteoporosis   . Parathyroid disorder ([place])   . Irritable bowel syndrome 12/11/2013  . Headache(784.0) 12/10/2013    Orientation RESPIRATION BLADDER Height & Weight     Self  Normal Incontinent Weight: 74.8 kg (165 lb) Height:  5\' 1"  (154.9 cm)  BEHAVIORAL SYMPTOMS/MOOD NEUROLOGICAL BOWEL NUTRITION STATUS      Continent Diet(Please see DC Summary)  AMBULATORY STATUS COMMUNICATION OF NEEDS Skin   Limited Assist Verbally Normal                       Personal Care Assistance Level of Assistance  Bathing, Feeding, Dressing Bathing Assistance: Maximum assistance Feeding assistance: Independent Dressing Assistance: Limited  assistance     Functional Limitations Info             SPECIAL CARE FACTORS FREQUENCY  PT (By licensed PT), OT (By licensed OT)     PT Frequency: 5x/week OT Frequency: 3x/week            Contractures      Additional Factors Info  Code Status, Allergies Code Status Info: Full Allergies Info: Anesthetics, Ester, Codeine, Tape, Amlodipine, Hydrochlorothiazide, Onion, Latex           Current Medications (10/13/2017):  This is the current hospital active medication list Current Facility-Administered Medications  Medication Dose Route Frequency Provider Last Rate Last Dose  . acetaminophen (TYLENOL) tablet 650 mg  650 mg Oral Q8H PRN Eulogio Bear U, DO   650 mg at 10/12/17 0840  . dextrose 5 % and 0.45 % NaCl with KCl 40 mEq/L infusion   Intravenous Continuous Velvet Bathe, MD 100 mL/hr at 10/13/17 0506    . diclofenac sodium (VOLTAREN) 1 % transdermal gel 1 application  1 application Topical Daily PRN Eulogio Bear U, DO      . enoxaparin (LOVENOX) injection 40 mg  40 mg Subcutaneous Q24H Vann, Jessica U, DO      . hydrALAZINE (APRESOLINE) injection 10 mg  10 mg Intravenous Q6H PRN Blount, Xenia T, NP   10 mg at 10/13/17 0112  . hydrALAZINE (APRESOLINE) tablet 50 mg  50 mg Oral BID Eulogio Bear  U, DO   50 mg at 10/12/17 0840  . irbesartan (AVAPRO) tablet 300 mg  300 mg Oral Daily Eulogio Bear U, DO   300 mg at 10/12/17 0840  . isosorbide mononitrate (IMDUR) 24 hr tablet 30 mg  30 mg Oral Daily Velvet Bathe, MD   30 mg at 10/12/17 1628  . lactated ringers infusion   Intravenous Continuous Geradine Girt, DO 75 mL/hr at 10/12/17 0529    . magnesium oxide (MAG-OX) tablet 400 mg  400 mg Oral Daily Eulogio Bear U, DO   400 mg at 10/12/17 0840  . metoprolol tartrate (LOPRESSOR) injection 5 mg  5 mg Intravenous Q5 min PRN Lovey Newcomer T, NP   5 mg at 10/13/17 0450  . nebivolol (BYSTOLIC) tablet 10 mg  10 mg Oral Daily Eulogio Bear U, DO   10 mg at 10/12/17 0840  .  ondansetron (ZOFRAN) injection 4 mg  4 mg Intravenous Q6H PRN Vertis Kelch, NP   4 mg at 10/12/17 5929     Discharge Medications: Please see discharge summary for a list of discharge medications.  Relevant Imaging Results:  Relevant Lab Results:   Additional Information SSN: [place], [place]

## 2017-10-13 NOTE — Progress Notes (Signed)
PROGRESS NOTE    Sarah Cook  IRS:[id number] DOB: 05-16-1951 DOA: 10/11/2017 PCP: Cari Caraway, MD   Brief Narrative:   66 y.o. female with medical history significant of HTN, h/o breast cancer, hyperparathyroidism and recent fall from >6 feet with resultant tibial plateau fracture.  She recently retired after working at [employer].  She reports that she had has problems since she fell off her ladder in August- She fell > 6 feet unwitnessed and thinks she may have hit her head.   Assessment & Plan:    Altered mental status/Metabolic encephlopathy - Most likely secondary to elevated blood pressures. We'll plan on adding to help bring down blood pressure.  - discussed with Neurology who felt most likely cause at this juncture was Elevated blood pressures - Mental status has waxed and waned. Oral intake poor at this point. Will place nothing by mouth order - Patient is refusing antiepileptic medication  Active Problems:   Hyperparathyroidism ([place]) - TSH WNL    TIA (transient ischemic attack) - MRI negative, do not suspect TIA    DVT prophylaxis: Lovenox Code Status: Full Family Communication: d/c spouse Disposition Plan: Pending improvement in mental status   Consultants:   D/c neurology over phone  Neurosurgery evaluated case   Procedures: none   Antimicrobials: None   Subjective: Pt has no new complaints currently. Per family patient had difficulty swallowing  Objective: Vitals:   10/13/17 0435 10/13/17 0522 10/13/17 0859 10/13/17 1353  BP: (!) 199/86 (!) 153/90 (!) 174/80 (!) 172/77  Pulse: 91 87 96 99  Resp: 20  20 20   Temp: 98.7 F (37.1 C)  98.3 F (36.8 C) (!) 100.4 F (38 C)  TempSrc: Axillary  Oral Axillary  SpO2: 96%  98%   Weight:      Height:        Intake/Output Summary (Last 24 hours) at 10/13/2017 1652 Last data filed at 10/13/2017 0506 Gross per 24 hour  Intake 1573.75 ml  Output -  Net 1573.75 ml   Filed Weights   10/11/17 1438    Weight: 74.8 kg (165 lb)    Examination:  General exam: Appears calm and comfortable, in no acute distress Respiratory system: Clear to auscultation. Respiratory effort normal. Equal chest rise. Cardiovascular system: S1 & S2 heard, RRR. No JVD, murmurs Gastrointestinal system: Abdomen is nondistended, soft and nontender. No organomegaly or masses felt. Normal bowel sounds heard. Central nervous system: Patient does not interact verbally with examiner. No facial asymmetry Extremities: no deformities, warm Skin: No rashes, lesions or ulcers, on limited exam. Psychiatry: pt resting and hypersomnolent, unable to assess  Data Reviewed: I have personally reviewed following labs and imaging studies  CBC: Recent Labs  Lab 10/11/17 1518 10/12/17 0241  WBC 6.0 5.7  NEUTROABS 3.8  --   HGB 11.0* 10.6*  HCT 31.9* 30.7*  MCV 93.0 93.3  PLT 258 009   Basic Metabolic Panel: Recent Labs  Lab 10/11/17 1518 10/12/17 0241 10/13/17 0938  NA 138 140 139  K 3.9 2.8* 3.9  CL 105 105 107  CO2 23 25 23   GLUCOSE 92 88 136*  BUN 13 11 11   CREATININE 1.01* 0.93 0.89  CALCIUM 11.0* 12.7* 10.8*   GFR: Estimated Creatinine Clearance: 56.7 mL/min (by C-G formula based on SCr of 0.89 mg/dL). Liver Function Tests: Recent Labs  Lab 10/11/17 1518  AST 32  ALT 14  ALKPHOS 37*  BILITOT 1.6*  PROT 6.5  ALBUMIN 3.7   Recent Labs  Lab 10/11/17 1518  LIPASE 29   Recent Labs  Lab 10/11/17 1812  AMMONIA 10   Coagulation Profile: No results for input(s): INR, PROTIME in the last 168 hours. Cardiac Enzymes: No results for input(s): CKTOTAL, CKMB, CKMBINDEX, TROPONINI in the last 168 hours. BNP (last 3 results) No results for input(s): PROBNP in the last 8760 hours. HbA1C: Recent Labs    10/12/17 0241  HGBA1C 4.2*   CBG: No results for input(s): GLUCAP in the last 168 hours. Lipid Profile: Recent Labs    10/12/17 0241  CHOL 195  HDL 40*  LDLCALC 126*  TRIG 145  CHOLHDL 4.9    Thyroid Function Tests: Recent Labs    10/11/17 1812  TSH 1.084   Anemia Panel: Recent Labs    10/11/17 1812 10/13/17 1011  VITAMINB12 425 385  FOLATE  --  7.9   Sepsis Labs: No results for input(s): PROCALCITON, LATICACIDVEN in the last 168 hours.  No results found for this or any previous visit (from the past 240 hour(s)).       Radiology Studies: Mr Brain Wo Contrast  Result Date: 10/11/2017 CLINICAL DATA:  [place] 6 feet from ladder in August resulting in leg fracture, possible head injury. Altered mental status. History of breast cancer, hypertension, hyperlipidemia. EXAM: MRI HEAD WITHOUT CONTRAST TECHNIQUE: Multiplanar, multiecho pulse sequences of the brain and surrounding structures were obtained without intravenous contrast. COMPARISON:  CT HEAD October 11, 2017 at 1535 hours and MRI of the head April 20, 2016 FINDINGS: BRAIN: No reduced diffusion to suggest acute ischemia or hypercellular tumor. Numerous prominent posterior cerebrum and cerebellar chronic micro hemorrhages. RIGHT occipital lobe FLAIR T2 hyperintense signal in early encephalomalacia mild prominence of ventricles and sulci. No hydrocephalus. Patchy to confluent supratentorial white matter and pontine FLAIR T2 hyperintensities, progressed from prior MRI. Prominent bilateral basal ganglia perivascular spaces associated with chronic small vessel ischemic disease. No midline shift, mass effect or masses. No abnormal extra-axial fluid collections. VASCULAR: Normal major intracranial vascular flow voids present at skull base. SKULL AND UPPER CERVICAL SPINE: No abnormal sellar expansion. No suspicious calvarial bone marrow signal. Craniocervical junction maintained. SINUSES/ORBITS: Trace LEFT mastoid effusion. Imaged paranasal sinuses are well-aerated. The included ocular globes and orbital contents are non-suspicious. OTHER: None. IMPRESSION: 1. No acute intracranial process. 2. Small area RIGHT occipital lobe early  encephalomalacia most compatible with contusion , less likely PCA stroke. 3. 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 4. Mild parenchymal brain volume loss. Moderate chronic small vessel ischemic disease. Electronically Signed   By: Elon Alas M.D.   On: 10/11/2017 22:30        Scheduled Meds: . enoxaparin (LOVENOX) injection  40 mg Subcutaneous Q24H  . hydrALAZINE  50 mg Oral BID  . irbesartan  300 mg Oral Daily  . isosorbide mononitrate  60 mg Oral Daily  . magnesium oxide  400 mg Oral Daily  . nebivolol  10 mg Oral Daily   Continuous Infusions: . dextrose 5 % and 0.45 % NaCl with KCl 20 mEq/L    . lactated ringers 75 mL/hr at 10/12/17 0529     LOS: 2 days    Time spent: > 35 minutes  Velvet Bathe, MD [facility] Pager [phone]  If 7PM-7AM, please contact night-coverage [email or web address] Password [email or web address] 10/13/2017, 4:52 PM

## 2017-10-13 NOTE — Progress Notes (Signed)
Late Entry Clinical/Bedside Swallow Evaluation Patient Details  Name: Sarah Cook MRN: [id number] Date of Birth: [birth year]  Today's Date: 10/13/2017 Time:        Past Medical History:  Past Medical History:  Diagnosis Date  . Allergy   . Anxiety   . Breast cancer ([place])   . Cataract    early signs of  . Heart disease   . Hemorrhoids   . Hiatal hernia 2014   large  . Hyperlipidemia   . Hyperparathyroidism ([place])   . Hypertension   . Osteoporosis   . Parathyroid disorder ([place])   . Pulmonary nodules   . TIA (transient ischemic attack)    Past Surgical History:  Past Surgical History:  Procedure Laterality Date  . BLADDER SUSPENSION    . CESAREAN SECTION     x 2  . CHOLECYSTECTOMY    . MASTECTOMY Right   . PARATHYROIDECTOMY    . TOTAL ELBOW REPLACEMENT    . TUBAL LIGATION     HPI:      Assessment / Plan / Recommendation Clinical Impression         Aspiration Risk       Diet Recommendation          Other  Recommendations     Follow up Recommendations        Frequency and Duration         10/12/17 0758  SLP Visit Information  SLP Received On 10/12/17  General Information  HPI Sarah Cook a 66 y.o.femalewith medical history significant ofHTN, h/o breast cancer, hyperparathyroidism and recent fall from >6 feet (06/2017) with resultant tibial plateau fracture.For the last few days she has noticed that her BP has been elevated, pain on her head on the right and posterior and increasing weakness and confusion. 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 Moderate chronic small vessel ischemic disease.  Type of Study Bedside Swallow Evaluation  Previous  Swallow Assessment (none found)  Diet Prior to this Study NPO  Temperature Spikes Noted No  Respiratory Status Room air  History of Recent Intubation No  Behavior/Cognition Lethargic/Drowsy;Requires cueing  Oral Cavity Assessment Other (comment) (odorous)  Oral Care Completed by SLP Yes  Oral Cavity - Dentition Adequate natural dentition  Vision Functional for self-feeding  Self-Feeding Abilities Needs set up;Needs assist  Patient Positioning Upright in chair  Baseline Vocal Quality Low vocal intensity  Volitional Cough (not attempted due to severity if headache)  Volitional Swallow Able to elicit  Pain Assessment  Pain Assessment Faces  Faces Pain Scale 6  Pain Location head  Pain Descriptors / Indicators Constant  Pain Intervention(s) Patient requesting pain meds-RN notified  Oral Assessment (Complete on admission/transfer/change in patient condition)  Does patient have any of the following "high risk" factors? Nutritional status - fluids only or NPO for >24 hours  Oral Motor/Sensory Function  Overall Oral Motor/Sensory Function Mild impairment  Facial ROM Reduced right;Suspected CN VII (facial) dysfunction  Facial Symmetry Abnormal symmetry right;Suspected CN VII (facial) dysfunction  Facial Strength Reduced right;Suspected CN VII (facial) dysfunction  Lingual ROM Reduced right;Reduced left  Lingual Symmetry WFL  Lingual Strength (TBA)  Mandible WFL  Ice Chips  Ice chips NT  Thin Liquid  Thin Liquid WFL  Presentation Cup;Straw  Nectar  Thick Liquid  Nectar Thick Liquid NT  Honey Thick Liquid  Honey Thick Liquid NT  Puree  Puree WFL  Solid  Solid WFL  SLP - End of Session  Patient left in chair;with family/visitor present  Nurse Communication Treatment plan;Diet recommendation;Swallow strategies reviewed  SLP Assessment  Clinical Impression Statement (ACUTE ONLY) Pt reporting and symptoms present of significant headache (RN notified). Pt lethargic, leaning to right  side in chair. Mild right facial weakness during oral-motor assessment. Verbal and tactile cues intermittently needed for arousal. Pt notes coughing during po intake "sometimes" but husband has not observed. Minimal participation due to headahce with consumption of thin, puree and solid texture. No cough, throat clear, wet vocal quality or other aspiration indicators present. Recommending pt initiate Dys 3 texture, thin liquids, pills with thin, straws allowed, alert for po's. ST will continue to follow for most optimum solid texture and safety.         SLP Visit Diagnosis Dysphagia, unspecified (R13.10)  Impact on safety and function Mild aspiration risk  Other Related Risk Factors Deconditioning;Lethargy;Cognitive impairment  Swallow Evaluation Recommendations  SLP Diet Recommendations Dysphagia 3 (Mech soft);Thin liquid  Liquid Administration via Straw;Cup  Medication Administration Whole meds with liquid  Supervision Patient able to self feed;Full supervision/cueing for compensatory strategies  Compensations Slow rate;Small sips/bites;Minimize environmental distractions;Lingual sweep for clearance of pocketing  Postural Changes Seated upright at 90 degrees  Treatment Plan  Oral Care Recommendations Oral care BID  Treatment Recommendations Therapy as outlined in treatment plan below  Follow up Recommendations (TBD)  Speech Therapy Frequency (ACUTE ONLY) min 2x/week  Treatment Duration 2 weeks  Interventions Patient/family education;Compensatory techniques;Diet toleration management by SLP;Trials of upgraded texture/liquids  Prognosis  Prognosis for Safe Diet Advancement Good  Individuals Consulted  Consulted and Agree with Results and Recommendations Patient;Family member/caregiver;RN  Family Member Consulted husband  SLP Time Calculation  SLP Start Time (ACUTE ONLY) [phone]  SLP Stop Time (ACUTE ONLY) 0831  SLP Time Calculation (min) (ACUTE ONLY) 17 min  SLP Evaluations  $ SLP Speech  Visit 1 Visit  SLP Evaluations  $BSS Swallow 1 Procedure      Prognosis        Swallow Study   General      Oral/Motor/Sensory Function     Ice Chips     Thin Liquid      Nectar Thick     Honey Thick     Puree        Solid   GO            Sarah Cook, Orbie Pyo 10/13/2017,10:45 AM   Orbie Pyo Colvin Caroli.Ed [employer] [phone]

## 2017-10-13 NOTE — Care Management Note (Signed)
Case Management Note  Patient Details  Name: Sarah Cook MRN: [id number] Date of Birth: 1951-06-04  Subjective/Objective:                    Action/Plan: CM met with the patients husband and he is interested in having patient go to SNF rehab prior to returning home. He was in agreement with a SNF in [facility]. CSW updated. CM following for d/c disposition.  Expected Discharge Date:                  Expected Discharge Plan:  Skilled Nursing Facility  In-House Referral:  Clinical Social Work  Discharge planning Services     Post Acute Care Choice:    Choice offered to:     DME Arranged:    DME Agency:     HH Arranged:    [place] Agency:     Status of Service:  In process, will continue to follow  If discussed at Long Length of Stay Meetings, dates discussed:    Additional Comments:  Pollie Friar, RN 10/13/2017, 5:02 PM

## 2017-10-13 NOTE — Evaluation (Signed)
Speech Language Pathology Evaluation Patient Details Name: Sarah Cook MRN: [id number] DOB: [birth year] Today's Date: 10/13/2017 Time: 8242-3536 SLP Time Calculation (min) (ACUTE ONLY): 10 min  Problem List:  Patient Active Problem List   Diagnosis Date Noted  . Altered mental status 10/11/2017  . 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[place]) 03/09/2016  . Chest pain 2016/02/07  . History of antineoplastic chemotherapy with cardiotoxic drugs 2016-02-07  . Family history of sudden cardiac death in father 02-07-2016  . Breast cancer ([place])   . Hemorrhoids   . Hiatal hernia   . Pulmonary nodules   . Hyperparathyroidism ([place])   . Hypertension   . Hyperlipidemia   . Heart disease   . Allergy   . Anxiety   . TIA (transient ischemic attack)   . Cataract   . Osteoporosis   . Parathyroid disorder ([place])   . Irritable bowel syndrome 12/11/2013  . Headache(784.0) 12/10/2013   Past Medical History:  Past Medical History:  Diagnosis Date  . Allergy   . Anxiety   . Breast cancer ([place])   . Cataract    early signs of  . Heart disease   . Hemorrhoids   . Hiatal hernia 2014   large  . Hyperlipidemia   . Hyperparathyroidism ([place])   . Hypertension   . Osteoporosis   . Parathyroid disorder ([place])   . Pulmonary nodules   . TIA (transient ischemic attack)    Past Surgical History:  Past Surgical History:  Procedure Laterality Date  . BLADDER SUSPENSION    . CESAREAN SECTION     x 2  . CHOLECYSTECTOMY    . MASTECTOMY Right   . PARATHYROIDECTOMY    . TOTAL ELBOW REPLACEMENT    . TUBAL LIGATION     HPI:  Sarah Cook a 66 y.o.femalewith medical history significant ofHTN, h/o breast cancer, hyperparathyroidism and recent fall from >6 feet (06/2017) with resultant tibial plateau fracture.For the last few days she has noticed that her BP has been elevated, pain on her head on the right and posterior and increasing weakness and confusion. 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 Moderate chronic small vessel ischemic disease.   Assessment / Plan / Recommendation Clinical Impression  Pt demonstrating significant cognitive impairments (declined from yesterday) in all areas primarily alertness and awareness. Inconsistent verbal responses, twitches when touched and keeps eyes closed. Pt not oriented to place or time (spontaneously stated she had a fall therefore some recall of past situation). Suspect her comprehension is impacted more from her reduced ability to attend to speaker/auditory information. SLP will continue to see for cognitive deficits (and swallow- see previous note).       SLP Assessment  SLP Visit Diagnosis: Dysphagia, unspecified (R13.10)    Follow Up Recommendations  Skilled Nursing facility    Frequency and Duration min 2x/week  2 weeks      SLP Evaluation Cognition  Overall Cognitive Status: Impaired/Different from baseline Arousal/Alertness: Lethargic Orientation Level: Oriented to person;Disoriented to place;Disoriented to time;Disoriented to situation(she did state she fell) Attention: Sustained Sustained Attention: Impaired Sustained Attention Impairment: Verbal basic Memory: Impaired Awareness: Impaired Awareness Impairment: Intellectual impairment;Emergent impairment;Anticipatory impairment Problem Solving: Impaired Problem Solving Impairment: Functional basic Safety/Judgment: Impaired       Comprehension  Auditory  Comprehension Overall Auditory Comprehension: Impaired Commands: Impaired Interfering Components: Attention;Processing speed;Working Curator: Not tested Reading  Comprehension Reading Status: (TBA)    Expression Expression Primary Mode of Expression: Verbal Verbal Expression Initiation: Impaired Level of Generative/Spontaneous Verbalization: Sentence Repetition: (NT) Naming: Not tested Pragmatics: Impairment Impairments: Abnormal affect;Eye contact;Topic appropriateness Interfering Components: Attention Written Expression Dominant Hand: Right Written Expression: (TBA)   Oral / Motor  Oral Motor/Sensory Function Overall Oral Motor/Sensory Function: Mild impairment Facial ROM: Reduced right;Suspected CN VII (facial) dysfunction Facial Symmetry: Abnormal symmetry right;Suspected CN VII (facial) dysfunction Facial Strength: Reduced right;Suspected CN VII (facial) dysfunction Lingual ROM: Reduced right;Reduced left Lingual Symmetry: Within Functional Limits Mandible: Within Functional Limits Motor Speech Overall Motor Speech: Impaired Respiration: Within functional limits Phonation: Low vocal intensity Resonance: Within functional limits Intelligibility: Intelligibility reduced Word: 75-100% accurate Phrase: 75-100% accurate   GO                    Houston Siren 10/13/2017, 11:55 AM   Orbie Pyo Colvin Caroli.Ed [employer] [phone]

## 2017-10-13 NOTE — Progress Notes (Signed)
  Speech Language Pathology Treatment: Dysphagia  Patient Details Name: Sarah Cook MRN: [id number] DOB: [birth year] Today's Date: 10/13/2017 Time: 0272-5366 SLP Time Calculation (min) (ACUTE ONLY): 10 min  Assessment / Plan / Recommendation Clinical Impression  Pt with decreased alertness, awareness and cognitive status today. Responds inconsistently with delays, initially reluctant to sit upright due to pain. Oral care mostly to lips and unable to loosen dried debris adequately to remove (decreased oral opening). SLP provided max multimodal support throughout session with unsuccessful attempts to use straw- not forming seal. SLP pinching straw to provide small amounts past lips with labial leakage and suspect delayed swallow consuming only trace amounts. Presently she is unable to safely eat/drink due to decreased level of alertness/awareness and cognitive impairments and recommend NPO until improvements observed. Consider alternate means nutrition ([facility] team is here today). SLP will continue to treat.    HPI HPI: Sarah Cook a 66 y.o.femalewith medical history significant ofHTN, h/o breast cancer, hyperparathyroidism and recent fall from >6 feet (06/2017) with resultant tibial plateau fracture.For the last few days she has noticed that her BP has been elevated, pain on her head on the right and posterior and increasing weakness and confusion. 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 Moderate chronic small vessel ischemic disease.      SLP Plan  Continue with current plan of care       Recommendations  Diet recommendations: NPO Medication Administration: Via alternative means              Oral Care Recommendations: Oral care QID Follow up Recommendations: Skilled Nursing facility SLP Visit Diagnosis: Dysphagia, unspecified (R13.10) Plan: Continue with current plan of care                      Houston Siren 10/13/2017, 11:22 AM  Orbie Pyo Colvin Caroli.[employer] [phone]

## 2017-10-14 DIAGNOSIS — R404 Transient alteration of awareness: Secondary | ICD-10-CM | POA: Diagnosis not present

## 2017-10-14 LAB — HIV ANTIBODY (ROUTINE TESTING W REFLEX): HIV SCREEN 4TH GENERATION: NONREACTIVE

## 2017-10-14 LAB — RPR: RPR Ser Ql: NONREACTIVE

## 2017-10-14 MED ORDER — HYDRALAZINE HCL 20 MG/ML IJ SOLN
10.0000 mg | INTRAMUSCULAR | Status: DC | PRN
  Administered 2017-10-15 – 2017-10-16 (×6): 10 mg via INTRAVENOUS
  Filled 2017-10-14 (×7): qty 1

## 2017-10-14 MED ORDER — ACETAMINOPHEN 650 MG RE SUPP
650.0000 mg | Freq: Four times a day (QID) | RECTAL | Status: DC | PRN
  Administered 2017-10-15 (×2): 650 mg via RECTAL
  Filled 2017-10-14 (×2): qty 1

## 2017-10-14 MED ORDER — SPIRONOLACTONE 25 MG PO TABS
25.0000 mg | ORAL_TABLET | Freq: Every day | ORAL | Status: DC
  Administered 2017-10-16 – 2017-10-17 (×2): 25 mg via ORAL
  Filled 2017-10-14 (×3): qty 1

## 2017-10-14 MED ORDER — HYDRALAZINE HCL 50 MG PO TABS: 50.0000 mg | ORAL_TABLET | Freq: Three times a day (TID) | ORAL | Status: DC

## 2017-10-14 NOTE — Progress Notes (Signed)
PROGRESS NOTE    Sarah Cook  FTD:[id number] DOB: [birth year] DOA: 10/11/2017 PCP: Cari Caraway, MD   Brief Narrative:   66 y.o. female with medical history significant of HTN, h/o breast cancer, hyperparathyroidism and recent fall from >6 feet with resultant tibial plateau fracture.  She recently retired after working at [employer].  She reports that she had has problems since she fell off her ladder in August- She fell > 6 feet unwitnessed and thinks she may have hit her head.  Assessment & Plan:    Altered mental status/Metabolic encephlopathy - Most likely secondary to elevated blood pressures. We'll plan on adding to help bring down blood pressure.  - discussed with Neurology who felt most likely cause at this juncture was Elevated blood pressures - Mental status has waxed and waned. Oral intake poor at this point. Will place nothing by mouth order - Patient is refusing antiepileptic medication - will increase hydralazine and add spironolactone  Active Problems:   Hyperparathyroidism (HCC) - TSH WNL    TIA (transient ischemic attack) - MRI negative, do not suspect TIA    DVT prophylaxis: Lovenox Code Status: Full Family Communication: d/c spouse Disposition Plan: Pending improvement in mental status   Consultants:   D/c neurology over phone  Neurosurgery evaluated case   Procedures: none   Antimicrobials: None   Subjective: Pt has been hypersomnolent still  Objective: Vitals:   10/14/17 0135 10/14/17 0500 10/14/17 1033 10/14/17 1541  BP:  (!) 158/86 (!) 150/62 (!) 189/73  Pulse:  99 81 85  Resp:  20 18 19   Temp: 100 F (37.8 C) 99.7 F (37.6 C) 98.1 F (36.7 C) 98.3 F (36.8 C)  TempSrc:  Axillary Axillary Axillary  SpO2:  98% 99% (!) 75%  Weight:      Height:        Intake/Output Summary (Last 24 hours) at 10/14/2017 1706 Last data filed at 10/14/2017 1600 Gross per 24 hour  Intake 2000 ml  Output -  Net 2000 ml   Filed Weights   10/11/17  1438  Weight: 74.8 kg (165 lb)    Examination:  General exam: Appears calm and comfortable, in no acute distress, pt somnolent but arousable. Respiratory system: Clear to auscultation. Respiratory effort normal. Equal chest rise. No wheezes Cardiovascular system: S1 & S2 heard, RRR. No JVD, murmurs Gastrointestinal system: Abdomen is nondistended, soft and nontender. No organomegaly or masses felt. Normal bowel sounds heard. Central nervous system: Patient does not interact verbally with examiner. No facial asymmetry Extremities: no deformities, warm Skin: No rashes, lesions or ulcers, on limited exam. Psychiatry: pt resting and hypersomnolent, unable to assess  Data Reviewed: I have personally reviewed following labs and imaging studies  CBC: Recent Labs  Lab 10/11/17 1518 10/12/17 0241  WBC 6.0 5.7  NEUTROABS 3.8  --   HGB 11.0* 10.6*  HCT 31.9* 30.7*  MCV 93.0 93.3  PLT 258 062   Basic Metabolic Panel: Recent Labs  Lab 10/11/17 1518 10/12/17 0241 10/13/17 0938  NA 138 140 139  K 3.9 2.8* 3.9  CL 105 105 107  CO2 23 25 23   GLUCOSE 92 88 136*  BUN 13 11 11   CREATININE 1.01* 0.93 0.89  CALCIUM 11.0* 12.7* 10.8*   GFR: Estimated Creatinine Clearance: 56.7 mL/min (by C-G formula based on SCr of 0.89 mg/dL). Liver Function Tests: Recent Labs  Lab 10/11/17 1518  AST 32  ALT 14  ALKPHOS 37*  BILITOT 1.6*  PROT 6.5  ALBUMIN  3.7   Recent Labs  Lab 10/11/17 1518  LIPASE 29   Recent Labs  Lab 10/11/17 1812  AMMONIA 10   Coagulation Profile: No results for input(s): INR, PROTIME in the last 168 hours. Cardiac Enzymes: No results for input(s): CKTOTAL, CKMB, CKMBINDEX, TROPONINI in the last 168 hours. BNP (last 3 results) No results for input(s): PROBNP in the last 8760 hours. HbA1C: Recent Labs    10/12/17 0241  HGBA1C 4.2*   CBG: No results for input(s): GLUCAP in the last 168 hours. Lipid Profile: Recent Labs    10/12/17 0241  CHOL 195    HDL 40*  LDLCALC 126*  TRIG 145  CHOLHDL 4.9   Thyroid Function Tests: Recent Labs    10/11/17 1812  TSH 1.084   Anemia Panel: Recent Labs    10/11/17 1812 10/13/17 1011  VITAMINB12 425 385  FOLATE  --  7.9   Sepsis Labs: No results for input(s): PROCALCITON, LATICACIDVEN in the last 168 hours.  No results found for this or any previous visit (from the past 240 hour(s)).    Radiology Studies: No results found.   Scheduled Meds: . enoxaparin (LOVENOX) injection  40 mg Subcutaneous Q24H  . hydrALAZINE  50 mg Oral BID  . irbesartan  300 mg Oral Daily  . isosorbide mononitrate  60 mg Oral Daily  . magnesium oxide  400 mg Oral Daily  . nebivolol  10 mg Oral Daily   Continuous Infusions: . dextrose 5 % and 0.45 % NaCl with KCl 20 mEq/L 100 mL/hr at 10/14/17 1011  . lactated ringers 75 mL/hr at 10/12/17 0529     LOS: 3 days    Time spent: > 35 minutes  Velvet Bathe, MD [facility] Pager [phone]  If 7PM-7AM, please contact night-coverage [email or web address] Password [email or web address] 10/14/2017, 5:06 PM

## 2017-10-15 ENCOUNTER — Encounter (HOSPITAL_COMMUNITY): Payer: Self-pay | Admitting: General Practice

## 2017-10-15 DIAGNOSIS — R404 Transient alteration of awareness: Secondary | ICD-10-CM | POA: Diagnosis not present

## 2017-10-15 LAB — BASIC METABOLIC PANEL
Anion gap: 7 (ref 5–15)
BUN: 9 mg/dL (ref 6–20)
CALCIUM: 9.6 mg/dL (ref 8.9–10.3)
CHLORIDE: 104 mmol/L (ref 101–111)
CO2: 22 mmol/L (ref 22–32)
CREATININE: 0.66 mg/dL (ref 0.44–1.00)
GFR calc non Af Amer: >60 mL/min (ref >60)
Glucose, Bld: 135 mg/dL — ABNORMAL HIGH (ref 65–99)
Potassium: 3.3 mmol/L — ABNORMAL LOW (ref 3.5–5.1)
SODIUM: 133 mmol/L — AB (ref 135–145)

## 2017-10-15 MED ORDER — CHLORHEXIDINE GLUCONATE 0.12 % MT SOLN
15.0000 mL | Freq: Two times a day (BID) | OROMUCOSAL | Status: DC
  Administered 2017-10-15 – 2017-10-18 (×9): 15 mL via OROMUCOSAL
  Filled 2017-10-15 (×10): qty 15

## 2017-10-15 MED ORDER — ORAL CARE MOUTH RINSE
15.0000 mL | Freq: Two times a day (BID) | OROMUCOSAL | Status: DC
  Administered 2017-10-15 – 2017-10-18 (×6): 15 mL via OROMUCOSAL

## 2017-10-15 MED ORDER — ISOSORBIDE MONONITRATE ER 60 MG PO TB24: 90.0000 mg | ORAL_TABLET | Freq: Every day | ORAL | Status: DC

## 2017-10-15 MED ORDER — HYDRALAZINE HCL 50 MG PO TABS
50.0000 mg | ORAL_TABLET | Freq: Four times a day (QID) | ORAL | Status: DC
  Administered 2017-10-16 – 2017-10-18 (×8): 50 mg via ORAL
  Filled 2017-10-15 (×10): qty 1

## 2017-10-15 NOTE — Progress Notes (Signed)
PROGRESS NOTE    Sarah Cook  OZD:[id number] DOB: Jun 18, 1951 DOA: 10/11/2017 PCP: Cari Caraway, MD   Brief Narrative:   66 y.o. female with medical history significant of HTN, h/o breast cancer, hyperparathyroidism and recent fall from >6 feet with resultant tibial plateau fracture.  She recently retired after working at [employer].  She reports that she had has problems since she fell off her ladder in August- She fell > 6 feet unwitnessed and thinks she may have hit her head.  Assessment & Plan:  Altered mental status/Metabolic encephlopathy - Most likely secondary to elevated blood pressures. We'll plan on adding to help bring down blood pressure. - Discussed with Neurology who felt most likely cause at this juncture was Elevated blood pressures - Mental status has waxed and waned. Oral intake poor at this point. Will place nothing by mouth order - Patient is refusing antiepileptic medication - will increase hydralazine and add spironolactone  Active Problems:   Hyperparathyroidism (HCC) - TSH WNL    TIA (transient ischemic attack) - MRI negative, do not suspect TIA  DVT prophylaxis: Lovenox Code Status: Full Family Communication: d/c spouse Disposition Plan: Pending improvement in mental status   Consultants:   D/c neurology over phone  Neurosurgery evaluated case   Procedures: none   Antimicrobials: None   Subjective: Pt is more alert today and aswering questions appropriately.  Objective: Vitals:   10/15/17 0956 10/15/17 1343 10/15/17 1436 10/15/17 1518  BP: (!) 172/70 (!) 160/54 (!) 175/68 (!) 168/67  Pulse: 90 89  93  Resp:    17  Temp: 99.3 F (37.4 C) 98.6 F (37 C)  98.2 F (36.8 C)  TempSrc: Axillary Axillary  Axillary  SpO2: 97% 97%  99%  Weight:      Height:        Intake/Output Summary (Last 24 hours) at 10/15/2017 1521 Last data filed at 10/15/2017 0641 Gross per 24 hour  Intake 2258.33 ml  Output 1200 ml  Net 1058.33 ml   Filed  Weights   10/11/17 1438  Weight: 74.8 kg (165 lb)    Examination:  General exam: Pt is more alert and awake today. In nad. Respiratory system: Clear to auscultation. Respiratory effort normal. Equal chest rise. No wheezes Cardiovascular system: S1 & S2 heard, RRR. No JVD, murmurs Gastrointestinal system: Abdomen is nondistended, soft and nontender. No organomegaly or masses felt. Normal bowel sounds heard. Central nervous system: Patient does not interact verbally with examiner. No facial asymmetry Extremities: no deformities, warm Skin: No rashes, lesions or ulcers, on limited exam. Psychiatry: pt resting and hypersomnolent, unable to assess  Data Reviewed: I have personally reviewed following labs and imaging studies  CBC: Recent Labs  Lab 10/11/17 1518 10/12/17 0241  WBC 6.0 5.7  NEUTROABS 3.8  --   HGB 11.0* 10.6*  HCT 31.9* 30.7*  MCV 93.0 93.3  PLT 258 259   Basic Metabolic Panel: Recent Labs  Lab 10/11/17 1518 10/12/17 0241 10/13/17 0938 10/15/17 0014  NA 138 140 139 133*  K 3.9 2.8* 3.9 3.3*  CL 105 105 107 104  CO2 23 25 23 22   GLUCOSE 92 88 136* 135*  BUN 13 11 11 9   CREATININE 1.01* 0.93 0.89 0.66  CALCIUM 11.0* 12.7* 10.8* 9.6   GFR: Estimated Creatinine Clearance: 63.1 mL/min (by C-G formula based on SCr of 0.66 mg/dL). Liver Function Tests: Recent Labs  Lab 10/11/17 1518  AST 32  ALT 14  ALKPHOS 37*  BILITOT 1.6*  PROT 6.5  ALBUMIN 3.7   Recent Labs  Lab 10/11/17 1518  LIPASE 29   Recent Labs  Lab 10/11/17 1812  AMMONIA 10   Coagulation Profile: No results for input(s): INR, PROTIME in the last 168 hours. Cardiac Enzymes: No results for input(s): CKTOTAL, CKMB, CKMBINDEX, TROPONINI in the last 168 hours. BNP (last 3 results) No results for input(s): PROBNP in the last 8760 hours. HbA1C: No results for input(s): HGBA1C in the last 72 hours. CBG: No results for input(s): GLUCAP in the last 168 hours. Lipid Profile: No results  for input(s): CHOL, HDL, LDLCALC, TRIG, CHOLHDL, LDLDIRECT in the last 72 hours. Thyroid Function Tests: No results for input(s): TSH, T4TOTAL, FREET4, T3FREE, THYROIDAB in the last 72 hours. Anemia Panel: Recent Labs    10/13/17 1011  VITAMINB12 385  FOLATE 7.9   Sepsis Labs: No results for input(s): PROCALCITON, LATICACIDVEN in the last 168 hours.  No results found for this or any previous visit (from the past 240 hour(s)).    Radiology Studies: No results found.   Scheduled Meds: . chlorhexidine  15 mL Mouth Rinse BID  . enoxaparin (LOVENOX) injection  40 mg Subcutaneous Q24H  . hydrALAZINE  50 mg Oral Q6H  . irbesartan  300 mg Oral Daily  . [START ON 10/16/2017] isosorbide mononitrate  90 mg Oral Daily  . magnesium oxide  400 mg Oral Daily  . mouth rinse  15 mL Mouth Rinse q12n4p  . nebivolol  10 mg Oral Daily  . spironolactone  25 mg Oral Daily   Continuous Infusions: . dextrose 5 % and 0.45 % NaCl with KCl 20 mEq/L 100 mL/hr at 10/15/17 0635  . lactated ringers 75 mL/hr at 10/12/17 0529     LOS: 4 days    Time spent: > 35 minutes  Velvet Bathe, MD [facility] Pager [phone]  If 7PM-7AM, please contact night-coverage [email or web address] Password [email or web address] 10/15/2017, 3:21 PM

## 2017-10-16 ENCOUNTER — Encounter (HOSPITAL_COMMUNITY): Payer: Self-pay | Admitting: General Practice

## 2017-10-16 DIAGNOSIS — R404 Transient alteration of awareness: Secondary | ICD-10-CM | POA: Diagnosis not present

## 2017-10-16 MED ORDER — TRAMADOL HCL 50 MG PO TABS
50.0000 mg | ORAL_TABLET | Freq: Once | ORAL | Status: AC
  Administered 2017-10-16: 50 mg via ORAL
  Filled 2017-10-16: qty 1

## 2017-10-16 MED ORDER — ISOSORBIDE MONONITRATE ER 60 MG PO TB24
120.0000 mg | ORAL_TABLET | Freq: Every day | ORAL | Status: DC
  Administered 2017-10-16 – 2017-10-19 (×4): 120 mg via ORAL
  Filled 2017-10-16 (×4): qty 2

## 2017-10-16 NOTE — Clinical Social Work Note (Signed)
Clinical Social Work Assessment  Patient Details  Name: Sarah Cook MRN: [id number] Date of Birth: 12/15/1950  Date of referral:  10/16/17               Reason for consult:  Facility Placement                Permission sought to share information with:  Facility Sport and exercise psychologist, Family Supports Permission granted to share information::  Yes, Verbal Permission Granted  Name::     Risk manager::  SNF  Relationship::  Spouse  Contact Information:     Housing/Transportation Living arrangements for the past 2 months:  Single Family Home Source of Information:  Spouse Patient Interpreter Needed:  None Criminal Activity/Legal Involvement Pertinent to Current Situation/Hospitalization:  No - Comment as needed Significant Relationships:  Spouse Lives with:  Self, Spouse Do you feel safe going back to the place where you live?  Yes Need for family participation in patient care:  Yes (Comment)(patient not fully oriented)  Care giving concerns:  Patient lives at home with spouse but will benefit from short term rehab at discharge before returning home.   Social Worker assessment / plan:  CSW spoke with patient's husband, Ollen Gross, and discussed recommendation for SNF. CSW explained referral process and discussed bed offers. CSW provided patient's husband with list of bed offers and encouraged him to research facility options and determine a preference. CSW to continue to follow.  Employment status:  Retired Surveyor, [employer] PT Recommendations:  [place] / Referral to community resources:  [place]  Patient/Family's Response to care:  Patient's husband agreeable to SNF placement.  Patient/Family's Understanding of and Emotional Response to Diagnosis, Current Treatment, and Prognosis:  Patient's husband discussed how he didn't know anything about any of the facilities in the area, but will be willing to do research and  determine which facility would be the best option. Patient's husband was appreciative of information and indicated understanding of CSW role in discharge planning process.  Emotional Assessment Appearance:  Appears stated age Attitude/Demeanor/Rapport:  Unable to Assess Affect (typically observed):  Unable to Assess Orientation:  Oriented to Self, Oriented to Place Alcohol / Substance use:  Not Applicable Psych involvement (Current and /or in the community):  No (Comment)  Discharge Needs  Concerns to be addressed:  Care Coordination Readmission within the last 30 days:  No Current discharge risk:  Dependent with Mobility Barriers to Discharge:  Continued Medical Work up, [place], [place] 10/16/2017, 12:33 PM

## 2017-10-16 NOTE — Progress Notes (Signed)
PROGRESS NOTE    Sarah Cook  QMG:[id number] DOB: 1951-06-12 DOA: 10/11/2017 PCP: Cari Caraway, MD   Brief Narrative:   66 y.o. female with medical history significant of HTN, h/o breast cancer, hyperparathyroidism and recent fall from >6 feet with resultant tibial plateau fracture.  She recently retired after working at [employer].  She reports that she had has problems since she fell off her ladder in August- She fell > 6 feet unwitnessed and thinks she may have hit her head.  Assessment & Plan:  Altered mental status/Metabolic encephlopathy - Most likely secondary to elevated blood pressures. We'll plan on adding to help bring down blood pressure. - Discussed with Neurology who felt most likely cause at this juncture was Elevated blood pressures - Mental status has waxed and waned. Oral intake poor at this point. Will place nothing by mouth order - Patient is refusing antiepileptic medication - will increase hydralazine and add spironolactone  Active Problems:   Hyperparathyroidism (HCC) - TSH WNL    TIA (transient ischemic attack) - MRI negative, do not suspect TIA  DVT prophylaxis: Lovenox Code Status: Full Family Communication: d/c spouse Disposition Plan: Pending improvement in mental status   Consultants:   D/c neurology over phone  Neurosurgery evaluated case   Procedures: none   Antimicrobials: None   Subjective: Pt has no new complaints. And is more vocal today.  Objective: Vitals:   10/15/17 2106 10/16/17 0108 10/16/17 0514 10/16/17 1154  BP: (!) 164/81 (!) 179/72 (!) 155/68 (!) 160/66  Pulse: (!) 105 (!) 110 100 96  Resp: 19 17 19  (!) 22  Temp: 98.1 F (36.7 C) 98.6 F (37 C) 98.3 F (36.8 C) (!) 97.4 F (36.3 C)  TempSrc: Oral Oral Oral Oral  SpO2: 96% 95% 97% 98%  Weight:      Height:        Intake/Output Summary (Last 24 hours) at 10/16/2017 1304 Last data filed at 10/15/2017 1652 Gross per 24 hour  Intake -  Output 800 ml  Net -800  ml   Filed Weights   10/11/17 1438  Weight: 74.8 kg (165 lb)    Examination:  General exam: Pt is more alert and awake today. In nad. Respiratory system: Clear to auscultation. Respiratory effort normal. Equal chest rise. No wheezes Cardiovascular system: S1 & S2 heard, RRR. No JVD Gastrointestinal system: Abdomen is nondistended, soft and nontender. No organomegaly or masses felt. Normal bowel sounds heard. Central nervous system: answers questions appropriately. No facial asymmetry Extremities: no deformities, warm Skin: No rashes, lesions or ulcers, on limited exam. Psychiatry: mood and affect appropriate  Data Reviewed: I have personally reviewed following labs and imaging studies  CBC: Recent Labs  Lab 10/11/17 1518 10/12/17 0241  WBC 6.0 5.7  NEUTROABS 3.8  --   HGB 11.0* 10.6*  HCT 31.9* 30.7*  MCV 93.0 93.3  PLT 258 326   Basic Metabolic Panel: Recent Labs  Lab 10/11/17 1518 10/12/17 0241 10/13/17 0938 10/15/17 0014  NA 138 140 139 133*  K 3.9 2.8* 3.9 3.3*  CL 105 105 107 104  CO2 23 25 23 22   GLUCOSE 92 88 136* 135*  BUN 13 11 11 9   CREATININE 1.01* 0.93 0.89 0.66  CALCIUM 11.0* 12.7* 10.8* 9.6   GFR: Estimated Creatinine Clearance: 63.1 mL/min (by C-G formula based on SCr of 0.66 mg/dL). Liver Function Tests: Recent Labs  Lab 10/11/17 1518  AST 32  ALT 14  ALKPHOS 37*  BILITOT 1.6*  PROT  6.5  ALBUMIN 3.7   Recent Labs  Lab 10/11/17 1518  LIPASE 29   Recent Labs  Lab 10/11/17 1812  AMMONIA 10   Coagulation Profile: No results for input(s): INR, PROTIME in the last 168 hours. Cardiac Enzymes: No results for input(s): CKTOTAL, CKMB, CKMBINDEX, TROPONINI in the last 168 hours. BNP (last 3 results) No results for input(s): PROBNP in the last 8760 hours. HbA1C: No results for input(s): HGBA1C in the last 72 hours. CBG: No results for input(s): GLUCAP in the last 168 hours. Lipid Profile: No results for input(s): CHOL, HDL,  LDLCALC, TRIG, CHOLHDL, LDLDIRECT in the last 72 hours. Thyroid Function Tests: No results for input(s): TSH, T4TOTAL, FREET4, T3FREE, THYROIDAB in the last 72 hours. Anemia Panel: No results for input(s): VITAMINB12, FOLATE, FERRITIN, TIBC, IRON, RETICCTPCT in the last 72 hours. Sepsis Labs: No results for input(s): PROCALCITON, LATICACIDVEN in the last 168 hours.  No results found for this or any previous visit (from the past 240 hour(s)).    Radiology Studies: No results found.   Scheduled Meds: . chlorhexidine  15 mL Mouth Rinse BID  . enoxaparin (LOVENOX) injection  40 mg Subcutaneous Q24H  . hydrALAZINE  50 mg Oral Q6H  . irbesartan  300 mg Oral Daily  . isosorbide mononitrate  120 mg Oral Daily  . magnesium oxide  400 mg Oral Daily  . mouth rinse  15 mL Mouth Rinse q12n4p  . nebivolol  10 mg Oral Daily  . spironolactone  25 mg Oral Daily   Continuous Infusions: . dextrose 5 % and 0.45 % NaCl with KCl 20 mEq/L 1,000 mL (10/16/17 0329)  . lactated ringers 75 mL/hr at 10/12/17 0529     LOS: 5 days    Time spent: > 35 minutes  Velvet Bathe, MD [facility] Pager [phone]  If 7PM-7AM, please contact night-coverage [email or web address] Password [email or web address] 10/16/2017, 1:04 PM

## 2017-10-16 NOTE — Progress Notes (Signed)
Physical Therapy Treatment Patient Details Name: Sarah Cook MRN: [id number] DOB: 1951/01/06 Today's Date: 10/16/2017    History of Present Illness 66 y.o. female with PMH of HLD, HTN, and TIA presents to the emergency department for evaluation of increasing confusion.  Patient has had worsening confusion over the past 6 months but the husband states she has been more confused over the past 24 hours. Fall from ladder in August of 2018 with resulting tibial plateau fx. Hx of total Elbow replacement and R mastectomy.     PT Comments    Pt progresses towards PT goals today, tolerating sit-to-stand and stand-pivot tranfers with mod-max physical assist and RW. Pt in chair post-session and reports that it feels nice to be out of bed. Pt continues to demonstrate confusion and memory deficits and is disoriented to situation and place. Current discharge plan remains appropriate. PT will follow acutely in order to improve mobility status and level of independence.    Follow Up Recommendations  Supervision/Assistance - 24 hour;SNF     Equipment Recommendations  None recommended by PT    Recommendations for Other Services       Precautions / Restrictions Precautions Precautions: Fall Restrictions Weight Bearing Restrictions: No    Mobility  Bed Mobility Overal bed mobility: Needs Assistance Bed Mobility: Rolling;Sidelying to Sit Rolling: Min assist Sidelying to sit: HOB elevated;Mod assist       General bed mobility comments: VCs to reach for railing when rolling and to push through UEs for sidelying to sit. Pt is unsteady while seated EOB.   Transfers Overall transfer level: Needs assistance Equipment used: Rolling walker (2 wheeled) Transfers: Sit to/from [facility] Sit to Stand: Max assist;Mod assist Stand pivot transfers: Mod assist       General transfer comment: x3 (x2 from EOB, x1 from [facility]). VCs needed for hand placement. Pt requiring max assist to  stand from EOB and mod assist to stand from [facility]. Pt unsteady and LEs shaking. Stand-pivot-transfer from bed to [facility] requiring mod assist and Vcs to reach for commode handrail.   Ambulation/Gait                 Stairs            Wheelchair Mobility    Modified Rankin (Stroke Patients Only)       Balance Overall balance assessment: Needs assistance Sitting-balance support: Feet supported;Single extremity supported Sitting balance-Leahy Scale: Poor Sitting balance - Comments: Pt demonstrates intermittent ability to maintain sitting balance, however pt demonstrates frequent LOB and requires min guard assist to maintain sitting balance.      Standing balance-Leahy Scale: Poor Standing balance comment: Pt requires mod physical assist for static standing balance.                             Cognition Arousal/Alertness: Awake/alert Behavior During Therapy: WFL for tasks assessed/performed Overall Cognitive Status: No family/caregiver present to determine baseline cognitive functioning Area of Impairment: Orientation;Attention;Memory;Following commands;Safety/judgement;Awareness;Problem solving                 Orientation Level: Disoriented to;Place;Situation Current Attention Level: Focused Memory: Decreased short-term memory Following Commands: Follows one step commands inconsistently Safety/Judgement: Decreased awareness of deficits Awareness: Intellectual Problem Solving: Slow processing;Decreased initiation;Difficulty sequencing;Requires verbal cues;Requires tactile cues General Comments: Pt demonstrates confusion throughout session, reporting that she has been in the hospital for weeks when she has only been here for a few days. Pt  is unsure which hospital she is in and seems to think that she is in [place]. Pt surprised to hear that her husband has been here to the hospital to visit her. Pt worried about where her cell phone is. Pt is unable to  tell when she is urinating, other than hearing the sound. Pt unable to sequence wiping her perineal region after using BSC. Pt tangential through session and needing constant re-directing to tasks.       Exercises      General Comments General comments (skin integrity, edema, etc.): Pt demonstrates intention tremor of all extremities (UEs > LEs). BP pre-session 158/71.       Pertinent Vitals/Pain Pain Assessment: No/denies pain    Home Living                      Prior Function            PT Goals (current goals can now be found in the care plan section) Progress towards PT goals: Progressing toward goals    Frequency    Min 2X/week      PT Plan Current plan remains appropriate    Co-evaluation              AM-PAC PT "6 Clicks" Daily Activity  Outcome Measure  Difficulty turning over in bed (including adjusting bedclothes, sheets and blankets)?: Unable Difficulty moving from lying on back to sitting on the side of the bed? : Unable Difficulty sitting down on and standing up from a chair with arms (e.g., wheelchair, bedside commode, etc,.)?: Unable Help needed moving to and from a bed to chair (including a wheelchair)?: A Lot Help needed walking in hospital room?: Total Help needed climbing 3-5 steps with a railing? : Total 6 Click Score: 7    End of Session Equipment Utilized During Treatment: Gait belt Activity Tolerance: Patient limited by fatigue;Patient tolerated treatment well Patient left: in chair;with chair alarm set;with call bell/phone within reach Nurse Communication: Mobility status PT Visit Diagnosis: History of falling (Z91.81);Muscle weakness (generalized) (M62.81);Unsteadiness on feet (R26.81);Other abnormalities of gait and mobility (R26.89)     Time: 2778-2423 PT Time Calculation (min) (ACUTE ONLY): 46 min  Charges:  $Therapeutic Activity: 38-52 mins                    G Codes:       Judee Clara, SPT   Judee Clara 10/16/2017, 12:13 PM

## 2017-10-16 NOTE — Progress Notes (Addendum)
Called attending to have PO hydralazine changed to IV he did not change it but told me to use the 10 mg PRN order every 4 hours. Patients BP continues to run in the 185'U systolic.

## 2017-10-16 NOTE — Progress Notes (Signed)
  Speech Language Pathology Treatment: Dysphagia;Cognitive-Linquistic  Patient Details Name: Sarah Cook MRN: [id number] DOB: 19-Sep-1951 Today's Date: 10/16/2017 Time: 8756-4332 SLP Time Calculation (min) (ACUTE ONLY): 19 min  Assessment / Plan / Recommendation Clinical Impression  Pt alert today and aware of self, following directions and able to sustain attention with supervision to speaker and activity. Session focused on dysphagia and cognitive intervention. Pt uncertain of place and reason for admission (thought was in [place].) and needed visual cue for place/bldg. Recalled [facility] (looked at white board) when asked at end of session. Reviewed reason for admission after pt inquiring.  No oral-motor deficits, speech intelligible. Consumption of regular texture and thin liquids via straw was unremarkable for oropharyngeal impairments. Recommend regular texture, thin, straws if desire and pills with thin. She would benefit from intermittent supervision as uncertain if cognitive status will wax and wane. Needs continued tx for cognitive-swallow independence.     HPI HPI: Sarah Cook a 66 y.o.femalewith medical history significant ofHTN, h/o breast cancer, hyperparathyroidism and recent fall from >6 feet (06/2017) with resultant tibial plateau fracture.For the last few days she has noticed that her BP has been elevated, pain on her head on the right and posterior and increasing weakness and confusion. 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 Moderate chronic small vessel ischemic disease.      SLP Plan  Continue with current plan of care       Recommendations  Diet recommendations:  Regular;Thin liquid Liquids provided via: Cup;Straw Medication Administration: Whole meds with liquid Supervision: Patient able to self feed Compensations: Slow rate;Small sips/bites                Oral Care Recommendations: Oral care BID Follow up Recommendations: Home health SLP SLP Visit Diagnosis: Dysphagia, unspecified (R13.10) Plan: Continue with current plan of care                       Sarah Cook 10/16/2017, 12:26 PM  Sarah Cook.[employer] [phone]

## 2017-10-16 NOTE — Progress Notes (Signed)
CSW following for discharge plan. CSW received update from patient's husband that they would prefer [facility]. CSW contacted Admissions to confirm available bed. Facility will initiate insurance authorization request through [employer].  CSW will continue to follow.  Laveda Abbe, [place] Clinical Social Worker [phone]

## 2017-10-17 DIAGNOSIS — R404 Transient alteration of awareness: Secondary | ICD-10-CM | POA: Diagnosis not present

## 2017-10-17 MED ORDER — TRAMADOL HCL 50 MG PO TABS
50.0000 mg | ORAL_TABLET | Freq: Once | ORAL | Status: AC
  Administered 2017-10-17: 50 mg via ORAL
  Filled 2017-10-17: qty 1

## 2017-10-17 MED ORDER — TRAMADOL HCL 50 MG PO TABS
50.0000 mg | ORAL_TABLET | Freq: Four times a day (QID) | ORAL | Status: DC | PRN
  Administered 2017-10-17 – 2017-10-18 (×4): 50 mg via ORAL
  Filled 2017-10-17 (×4): qty 1

## 2017-10-17 NOTE — Progress Notes (Signed)
Occupational Therapy Treatment Patient Details Name: Sarah Cook MRN: [id number] DOB: 12/26/1950 Today's Date: 10/17/2017    History of present illness 66 y.o. female with PMH of HLD, HTN, and TIA presents to the emergency department for evaluation of increasing confusion.  Patient has had worsening confusion over the past 6 months but the husband states she has been more confused over the past 24 hours. Fall from ladder in August of 2018 with resulting tibial plateau fx. Hx of total Elbow replacement and R mastectomy.    OT comments  Pt making good progress toward goals. Continue to recommend SNF for rehab. Will continue to follow acutely.  Follow Up Recommendations  SNF;Supervision/Assistance - 24 hour    Equipment Recommendations  3 in 1 bedside commode    Recommendations for Other Services      Precautions / Restrictions Precautions Precautions: Fall       Mobility Bed Mobility               General bed mobility comments: OOB in chair  Transfers Overall transfer level: Needs assistance Equipment used: Rolling walker (2 wheeled) Transfers: Sit to/from Stand Sit to Stand: Min assist              Balance     Sitting balance-Leahy Scale: Fair       Standing balance-Leahy Scale: Poor                             ADL either performed or assessed with clinical judgement   ADL Overall ADL's : Needs assistance/impaired Eating/Feeding: Set up   Grooming: Set up;Supervision/safety;Sitting                   Toilet Transfer: Minimal assistance;RW;Ambulation;Comfort height toilet;Grab bars   Toileting- Clothing Manipulation and Hygiene: Sit to/from stand;Moderate assistance       Functional mobility during ADLs: Minimal assistance;Rolling walker;Cueing for safety;Cueing for sequencing       Vision   Additional Comments: wears glasses at all times   Perception     Praxis      Cognition Arousal/Alertness:  Awake/alert Behavior During Therapy: WFL for tasks assessed/performed Overall Cognitive Status: Impaired/Different from baseline Area of Impairment: Orientation;Attention;Memory;Safety/judgement;Awareness;Problem solving                 Orientation Level: Disoriented to;Time;Situation Current Attention Level: Sustained Memory: Decreased short-term memory Following Commands: Follows one step commands consistently Safety/Judgement: Decreased awareness of safety;Decreased awareness of deficits Awareness: Emergent Problem Solving: Slow processing;Difficulty sequencing;Requires verbal cues          Exercises     Shoulder Instructions       General Comments      Pertinent Vitals/ Pain       Pain Assessment: Faces Faces Pain Scale: Hurts little more Pain Location: head Pain Descriptors / Indicators: Discomfort;Grimacing;Moaning Pain Intervention(s): Limited activity within patient's tolerance  Home Living                                          Prior Functioning/Environment              Frequency  Min 2X/week        Progress Toward Goals  OT Goals(current goals can now be found in the care plan section)  Progress towards OT goals: Progressing toward goals  Acute Rehab  OT Goals Patient Stated Goal: per husband - to find out what is wrong OT Goal Formulation: With patient/family Time For Goal Achievement: 10/26/17 Potential to Achieve Goals: Fair ADL Goals Pt Will Perform Grooming: with set-up;with supervision Pt Will Perform Upper Body Bathing: with set-up;with supervision Pt Will Perform Lower Body Bathing: with min assist;sit to/from stand Pt Will Transfer to Toilet: with min guard assist;ambulating;bedside commode Additional ADL Goal #1: Pt will demonstrate emergent level of awareness during ADL tasks  Plan Discharge plan remains appropriate    Co-evaluation                 AM-PAC PT "6 Clicks" Daily Activity     Outcome  Measure   Help from another person eating meals?: A Little Help from another person taking care of personal grooming?: A Little Help from another person toileting, which includes using toliet, bedpan, or urinal?: A Little Help from another person bathing (including washing, rinsing, drying)?: A Little Help from another person to put on and taking off regular upper body clothing?: A Little Help from another person to put on and taking off regular lower body clothing?: A Little 6 Click Score: 18    End of Session Equipment Utilized During Treatment: Gait belt;Rolling walker  OT Visit Diagnosis: Unsteadiness on feet (R26.81);Muscle weakness (generalized) (M62.81);History of falling (Z91.81);Other symptoms and signs involving cognitive function;Pain Pain - part of body: (head)   Activity Tolerance Patient tolerated treatment well   Patient Left in chair;with call bell/phone within reach;with chair alarm set   Nurse Communication Mobility status        Time: 1055-1130 OT Time Calculation (min): 35 min  Charges: OT General Charges $OT Visit: 1 Visit OT Treatments $Self Care/Home Management : 23-37 mins  [facility], OT/L  [phone] 10/17/2017   Frank Novelo,HILLARY 10/17/2017, 11:36 AM

## 2017-10-17 NOTE — Progress Notes (Signed)
PROGRESS NOTE    Sarah Cook  FGH:[id number] DOB: 26-Apr-1951 DOA: 10/11/2017 PCP: Cari Caraway, MD   Brief Narrative:   66 y.o. female with medical history significant of HTN, h/o breast cancer, hyperparathyroidism and recent fall from >6 feet with resultant tibial plateau fracture.  She recently retired after working at [employer].  She reports that she had has problems since she fell off her ladder in August- She fell > 6 feet unwitnessed and thinks she may have hit her head.  Neurology currently suspecting secondary to elevated blood pressures. Mentation has improved with improvement in blood pressure control  Assessment & Plan:  Altered mental status/Metabolic encephlopathy - Discussed with Neurology who felt most likely cause at this juncture was Elevated blood pressures - improved with improved control of blood pressures.   Head aches - ? Etiology, no red flags to suggest more profound neurological involvement. Husband states patient has had these before. - Will monitor another day and treat symptomatically. Other possibility is side effect to new blood pressure medication (started on imdur and spironolactone)  Malignant hypertension - controlled on hydralazine, avapro, imdur, bystolic  Active Problems:   Hyperparathyroidism (HCC) - TSH WNL    TIA (transient ischemic attack) - MRI negative, do not suspect TIA  DVT prophylaxis: Lovenox Code Status: Full Family Communication: d/c spouse Disposition Plan: Pending improvement in mental status   Consultants:   D/c neurology over phone  Neurosurgery evaluated case   Procedures: none   Antimicrobials: None   Subjective: Pt is more alert today. Complaining of head aches  Objective: Vitals:   10/17/17 0123 10/17/17 0449 10/17/17 1238 10/17/17 1341  BP: 125/60 137/65 (!) 122/59 (!) 122/47  Pulse: 71 85  100  Resp: 16 16  17   Temp: 98.1 F (36.7 C) 98.1 F (36.7 C)  98 F (36.7 C)  TempSrc: Oral Oral   Axillary  SpO2: 96% 98%  95%  Weight:      Height:        Intake/Output Summary (Last 24 hours) at 10/17/2017 1521 Last data filed at 10/17/2017 1200 Gross per 24 hour  Intake 120 ml  Output -  Net 120 ml   Filed Weights   10/11/17 1438  Weight: 74.8 kg (165 lb)    Examination:  General exam: Pt is in nad, alert and awake Respiratory system: Clear to auscultation. Respiratory effort normal. Equal chest rise. No wheezes Cardiovascular system: S1 & S2 heard, RRR. No JVD Gastrointestinal system: Abdomen is nondistended, soft and nontender. No organomegaly or masses felt. Normal bowel sounds heard. Central nervous system: answers questions appropriately. No facial asymmetry Extremities: no deformities, warm Skin: No rashes, lesions or ulcers, on limited exam. Psychiatry: mood and affect appropriate  Data Reviewed: I have personally reviewed following labs and imaging studies  CBC: Recent Labs  Lab 10/11/17 1518 10/12/17 0241  WBC 6.0 5.7  NEUTROABS 3.8  --   HGB 11.0* 10.6*  HCT 31.9* 30.7*  MCV 93.0 93.3  PLT 258 678   Basic Metabolic Panel: Recent Labs  Lab 10/11/17 1518 10/12/17 0241 10/13/17 0938 10/15/17 0014  NA 138 140 139 133*  K 3.9 2.8* 3.9 3.3*  CL 105 105 107 104  CO2 23 25 23 22   GLUCOSE 92 88 136* 135*  BUN 13 11 11 9   CREATININE 1.01* 0.93 0.89 0.66  CALCIUM 11.0* 12.7* 10.8* 9.6   GFR: Estimated Creatinine Clearance: 63.1 mL/min (by C-G formula based on SCr of 0.66 mg/dL). Liver Function Tests:  Recent Labs  Lab 10/11/17 1518  AST 32  ALT 14  ALKPHOS 37*  BILITOT 1.6*  PROT 6.5  ALBUMIN 3.7   Recent Labs  Lab 10/11/17 1518  LIPASE 29   Recent Labs  Lab 10/11/17 1812  AMMONIA 10   Coagulation Profile: No results for input(s): INR, PROTIME in the last 168 hours. Cardiac Enzymes: No results for input(s): CKTOTAL, CKMB, CKMBINDEX, TROPONINI in the last 168 hours. BNP (last 3 results) No results for input(s): PROBNP in the  last 8760 hours. HbA1C: No results for input(s): HGBA1C in the last 72 hours. CBG: No results for input(s): GLUCAP in the last 168 hours. Lipid Profile: No results for input(s): CHOL, HDL, LDLCALC, TRIG, CHOLHDL, LDLDIRECT in the last 72 hours. Thyroid Function Tests: No results for input(s): TSH, T4TOTAL, FREET4, T3FREE, THYROIDAB in the last 72 hours. Anemia Panel: No results for input(s): VITAMINB12, FOLATE, FERRITIN, TIBC, IRON, RETICCTPCT in the last 72 hours. Sepsis Labs: No results for input(s): PROCALCITON, LATICACIDVEN in the last 168 hours.  No results found for this or any previous visit (from the past 240 hour(s)).    Radiology Studies: No results found.   Scheduled Meds: . chlorhexidine  15 mL Mouth Rinse BID  . enoxaparin (LOVENOX) injection  40 mg Subcutaneous Q24H  . hydrALAZINE  50 mg Oral Q6H  . irbesartan  300 mg Oral Daily  . isosorbide mononitrate  120 mg Oral Daily  . magnesium oxide  400 mg Oral Daily  . mouth rinse  15 mL Mouth Rinse q12n4p  . nebivolol  10 mg Oral Daily  . spironolactone  25 mg Oral Daily   Continuous Infusions: . lactated ringers 75 mL/hr at 10/12/17 0529     LOS: 6 days    Time spent: > 35 minutes  Velvet Bathe, MD [facility] Pager [phone]  If 7PM-7AM, please contact night-coverage [email or web address] Password [email or web address] 10/17/2017, 3:21 PM

## 2017-10-17 NOTE — Progress Notes (Signed)
CSW continuing to follow for discharge planning. CSW awaiting insurance authorization approval through [facility] to admit to SNF. CSW contacted facility to discuss possibility of accepting patient under LOG pending authorization, and facility is not willing to do that at this time. Patient will not be able to discharge to SNF until authorization has been received.  CSW will continue to follow.  Laveda Abbe, [place] Clinical Social Worker [phone]

## 2017-10-18 DIAGNOSIS — G9340 Encephalopathy, unspecified: Secondary | ICD-10-CM | POA: Diagnosis present

## 2017-10-18 DIAGNOSIS — G44309 Post-traumatic headache, unspecified, not intractable: Secondary | ICD-10-CM

## 2017-10-18 DIAGNOSIS — R404 Transient alteration of awareness: Secondary | ICD-10-CM | POA: Diagnosis not present

## 2017-10-18 LAB — CREATININE, SERUM
Creatinine, Ser: 0.68 mg/dL (ref 0.44–1.00)
GFR calc Af Amer: >60 mL/min (ref >60)
GFR calc non Af Amer: >60 mL/min (ref >60)

## 2017-10-18 MED ORDER — ISOSORBIDE MONONITRATE ER 30 MG PO TB24: 30.0000 mg | ORAL_TABLET | Freq: Every day | ORAL | Status: DC

## 2017-10-18 MED ORDER — TRAMADOL HCL 50 MG PO TABS
50.0000 mg | ORAL_TABLET | ORAL | Status: AC
  Administered 2017-10-19: 50 mg via ORAL
  Filled 2017-10-18: qty 1

## 2017-10-18 MED ORDER — POTASSIUM CHLORIDE CRYS ER 20 MEQ PO TBCR: 40.0000 meq | EXTENDED_RELEASE_TABLET | Freq: Every day | ORAL | Status: DC

## 2017-10-18 MED ORDER — HYDRALAZINE HCL 50 MG PO TABS
50.0000 mg | ORAL_TABLET | Freq: Three times a day (TID) | ORAL | Status: DC
  Administered 2017-10-18 – 2017-10-19 (×4): 50 mg via ORAL
  Filled 2017-10-18 (×4): qty 1

## 2017-10-18 MED ORDER — TRAMADOL HCL 50 MG PO TABS: 50.0000 mg | ORAL_TABLET | Freq: Four times a day (QID) | ORAL | 0 refills | Status: DC | PRN

## 2017-10-18 MED ORDER — POTASSIUM CHLORIDE CRYS ER 20 MEQ PO TBCR
40.0000 meq | EXTENDED_RELEASE_TABLET | Freq: Every day | ORAL | Status: DC
  Administered 2017-10-18 – 2017-10-19 (×2): 40 meq via ORAL
  Filled 2017-10-18 (×2): qty 2

## 2017-10-18 MED ORDER — HYDRALAZINE HCL 50 MG PO TABS: 50.0000 mg | ORAL_TABLET | Freq: Three times a day (TID) | ORAL | 1 refills | Status: DC

## 2017-10-18 MED ORDER — FUROSEMIDE 40 MG PO TABS
40.0000 mg | ORAL_TABLET | Freq: Every day | ORAL | Status: DC
  Administered 2017-10-18 – 2017-10-19 (×2): 40 mg via ORAL
  Filled 2017-10-18 (×2): qty 1

## 2017-10-18 MED ORDER — OXYCODONE-ACETAMINOPHEN 5-325 MG PO TABS: 1.0000 | ORAL_TABLET | Freq: Four times a day (QID) | ORAL | 0 refills | Status: DC | PRN

## 2017-10-18 NOTE — Discharge Summary (Addendum)
Physician Discharge Summary  Sarah Cook YHC:[id number] DOB: [birth year] DOA: 10/11/2017  PCP: Cari Caraway, MD  Admit date: 10/11/2017 Discharge date: 10/19/2017  Time spent: 45 minutes  Recommendations for Outpatient Follow-up:  1. PCP in 1 week, please check Bmet at follow up 2. Consider Non urgent Neurology referral for chronic headaches   Discharge Diagnoses:    Headaches   Uncontrolled Hypertension   ENcephalopathy -due to uncontrolled HTN and post concussion syndrome   Hyperparathyroidism ([place])   TIA (transient ischemic attack)   Altered mental status   Concussion   Fall/HEad injury in August 2018  Discharge Condition: stable  Diet recommendation: low sodium  Filed Weights   10/11/17 1438  Weight: 74.8 kg (165 lb)    History of present illness:  66 y.o.femalewith medical history significant ofHTN, h/o breast cancer, hyperparathyroidism and suffered a severe fall from a ladder in August with resultant tibial plateau fracture. She recently retired after working at [employer]. She reports that she had has problems since she fell off her ladder in August- She fell >6 feet unwitnessed and thinks she may have hit her head. For the last few days she has noticed that her BP has been elevated over her normal.  And she has had some pain on her head on the right and posterior.  She has also had increasing weakness and confusion so sent to ED.  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 - most likely secondary to her serious fall and head injury in August 2018 -Mentation improved however still notes some mild cognitive delays, which will likely take weeks/months to  improve  -Discharge to rehabilitation -Consider outpatient follow-up with neurology -I have cut down doses of Percocet which could have been definitely contributing to her mentation  Accelerated hypertension -Also contributing to her headaches in addition to above -Improved now continue hydralazine, dose increased, continue ARB, Nebivolol, and started on imdur, by my partner 3days ago, which I have cut down but continued -also resumed lasix which she was on previously  H/o breast cancer -2000, R breast mastectomy, SN, chemo, ER/PR negative, HER2/Neu neg-- saw Dr. Marko Plume  Recent Tibial plateau fracture -s/p Cast now off, FU with Orthopedics -DC to Rehab   Consultations:  Dr.Vega discussed with Neurology  Discharge Exam: Vitals:   10/18/17 0505 10/18/17 0954  BP: 130/66 139/82  Pulse: 90 81  Resp: 20 19  Temp: 98.1 F (36.7 C)   SpO2: 98% 96%    General: AAOx2, slow to answer questions Cardiovascular: S1S2/RRR Respiratory: CTAB  Discharge Instructions   Discharge Instructions    Diet - low sodium heart healthy   Complete by:  As directed    Increase activity slowly   Complete by:  As directed      Allergies as of 10/18/2017      Reactions   Anesthetics, Ester Nausea And Vomiting   Codeine Nausea Only   Tape Other (See Comments)   Skin blisters   Amlodipine Other (See Comments)   Hydrochlorothiazide Other (See Comments)   Onion    Does not like   Latex Itching, Rash   Irritates skin      Medication List    TAKE these medications   acetaminophen 650 MG CR tablet Commonly known  as:  TYLENOL Take 650 mg by mouth every 8 (eight) hours.   amitriptyline 10 MG tablet Commonly known as:  ELAVIL Take 10 mg by mouth at bedtime.   CINNAMON PO Take 1 tablet by mouth daily.   CO Q 10 PO Take 1 tablet by mouth daily.   diclofenac sodium 1 % Gel Commonly known as:  VOLTAREN Apply 1 application topically daily as needed.   furosemide 40 MG  tablet Commonly known as:  LASIX TAKE 1 TABLET(40 MG) BY MOUTH DAILY   hydrALAZINE 50 MG tablet Commonly known as:  APRESOLINE Take 1 tablet (50 mg total) by mouth 3 (three) times daily. What changed:  when to take this   irbesartan 300 MG tablet Commonly known as:  AVAPRO Take 300 mg by mouth daily.   isosorbide mononitrate 30 MG 24 hr tablet Commonly known as:  IMDUR Take 1 tablet (30 mg total) by mouth daily. Start taking on:  10/19/2017   magnesium oxide 400 MG tablet Commonly known as:  MAG-OX Take 400 mg by mouth daily.   nebivolol 10 MG tablet Commonly known as:  BYSTOLIC Take 10 mg by mouth daily.   OVER THE COUNTER MEDICATION Take 2 capsules by mouth at bedtime. Tumeric Black pepper 8m tab take 1 tab by mouth daily   oxyCODONE-acetaminophen 5-325 MG tablet Commonly known as:  PERCOCET/ROXICET Take 1 tablet by mouth every 6 (six) hours as needed for severe pain. What changed:    how much to take  when to take this   potassium chloride SA 20 MEQ tablet Commonly known as:  K-DUR,KLOR-CON Take 2 tablets (40 mEq total) by mouth daily. Start taking on:  10/19/2017   PROBIOTIC PO Take 1 capsule by mouth daily. Takes a power   traMADol 50 MG tablet Commonly known as:  ULTRAM Take 1 tablet (50 mg total) by mouth every 6 (six) hours as needed for moderate pain.   Vitamin D3 2000 units Tabs Take 2,000 Units by mouth daily.      Allergies  Allergen Reactions  . Anesthetics, Ester Nausea And Vomiting  . Codeine Nausea Only  . Tape Other (See Comments)    Skin blisters  . Amlodipine Other (See Comments)  . Hydrochlorothiazide Other (See Comments)  . Onion     Does not like  . Latex Itching and Rash    Irritates skin   Contact information for after-discharge care    [facility] .   Service:  Skilled Nursing Contact information: [address] [phone]              The  results of significant diagnostics from this hospitalization (including imaging, microbiology, ancillary and laboratory) are listed below for reference.    Significant Diagnostic Studies: Ct Head Wo Contrast  Result Date: 10/11/2017 CLINICAL DATA:  Altered level of consciousness EXAM: CT HEAD WITHOUT CONTRAST TECHNIQUE: Contiguous axial images were obtained from the base of the skull through the vertex without intravenous contrast. COMPARISON:  July 01, 2017 FINDINGS: Brain: Age related volume loss is stable. There is no intracranial mass, hemorrhage, extra-axial fluid collection, or midline shift. There is patchy small vessel disease in the centra semiovale bilaterally. Elsewhere gray-white compartments appear normal. There is no evident acute infarct. Vascular: No hyperdense vessel. There is modest calcification in the carotid siphon regions bilaterally. Skull: Bony calvarium appears intact. Sinuses/Orbits: There is slight mucosal thickening and anterior ethmoid air cells. Other  visualized paranasal sinuses are clear. Orbits appear symmetric and unremarkable bilaterally. Other: Mastoid air cells are clear. IMPRESSION: Age related volume loss with patchy periventricular small vessel disease, stable. No acute infarct evident. No mass or hemorrhage. Modest arterial vascular calcification noted. Mild mucosal thickening in anterior ethmoid air cells. Electronically Signed   By: Lowella Grip III M.D.   On: 10/11/2017 15:45   Mr Brain Wo Contrast  Result Date: 10/11/2017 CLINICAL DATA:  [place] [address] 6 feet from ladder in August resulting in leg fracture, possible head injury. Altered mental status. History of breast cancer, hypertension, hyperlipidemia. EXAM: MRI HEAD WITHOUT CONTRAST TECHNIQUE: Multiplanar, multiecho pulse sequences of the brain and surrounding structures were obtained without intravenous contrast. COMPARISON:  CT HEAD October 11, 2017 at 1535 hours and MRI of the head April 20, 2016 FINDINGS:  BRAIN: No reduced diffusion to suggest acute ischemia or hypercellular tumor. Numerous prominent posterior cerebrum and cerebellar chronic micro hemorrhages. RIGHT occipital lobe FLAIR T2 hyperintense signal in early encephalomalacia mild prominence of ventricles and sulci. No hydrocephalus. Patchy to confluent supratentorial white matter and pontine FLAIR T2 hyperintensities, progressed from prior MRI. Prominent bilateral basal ganglia perivascular spaces associated with chronic small vessel ischemic disease. No midline shift, mass effect or masses. No abnormal extra-axial fluid collections. VASCULAR: Normal major intracranial vascular flow voids present at skull base. SKULL AND UPPER CERVICAL SPINE: No abnormal sellar expansion. No suspicious calvarial bone marrow signal. Craniocervical junction maintained. SINUSES/ORBITS: Trace LEFT mastoid effusion. Imaged paranasal sinuses are well-aerated. The included ocular globes and orbital contents are non-suspicious. OTHER: None. IMPRESSION: 1. No acute intracranial process. 2. Small area RIGHT occipital lobe early encephalomalacia most compatible with contusion , less likely PCA stroke. 3. 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 4. Mild parenchymal brain volume loss. Moderate chronic small vessel ischemic disease. Electronically Signed   By: Elon Alas M.D.   On: 10/11/2017 22:30    Microbiology: No results found for this or any previous visit (from the past 240 hour(s)).   Labs: Basic Metabolic Panel: Recent Labs  Lab 10/11/17 1518 10/12/17 0241 10/13/17 0938 10/15/17 0014 10/18/17 0527  NA 138 140 139 133*  --   K 3.9 2.8* 3.9 3.3*  --   CL 105 105 107 104  --   CO2 23 25 23 22   --   GLUCOSE 92 88 136* 135*  --   BUN 13 11 11 9   --   CREATININE 1.01* 0.93 0.89 0.66 0.68  CALCIUM 11.0* 12.7* 10.8* 9.6  --    Liver  Function Tests: Recent Labs  Lab 10/11/17 1518  AST 32  ALT 14  ALKPHOS 37*  BILITOT 1.6*  PROT 6.5  ALBUMIN 3.7   Recent Labs  Lab 10/11/17 1518  LIPASE 29   Recent Labs  Lab 10/11/17 1812  AMMONIA 10   CBC: Recent Labs  Lab 10/11/17 1518 10/12/17 0241  WBC 6.0 5.7  NEUTROABS 3.8  --   HGB 11.0* 10.6*  HCT 31.9* 30.7*  MCV 93.0 93.3  PLT 258 244   Cardiac Enzymes: No results for input(s): CKTOTAL, CKMB, CKMBINDEX, TROPONINI in the last 168 hours. BNP: BNP (last 3 results) No results for input(s): BNP in the last 8760 hours.  ProBNP (last 3 results) No results for input(s): PROBNP in the last 8760 hours.  CBG: No results for input(s): GLUCAP in the last 168 hours.     Signed:  Domenic Polite MD.  [facility] 10/18/2017, 11:23 AM

## 2017-10-19 DIAGNOSIS — R4182 Altered mental status, unspecified: Secondary | ICD-10-CM | POA: Diagnosis not present

## 2017-10-19 DIAGNOSIS — M810 Age-related osteoporosis without current pathological fracture: Secondary | ICD-10-CM | POA: Diagnosis not present

## 2017-10-19 DIAGNOSIS — Z9181 History of falling: Secondary | ICD-10-CM | POA: Diagnosis not present

## 2017-10-19 DIAGNOSIS — G44309 Post-traumatic headache, unspecified, not intractable: Secondary | ICD-10-CM | POA: Diagnosis not present

## 2017-10-19 DIAGNOSIS — Z8673 Personal history of transient ischemic attack (TIA), and cerebral infarction without residual deficits: Secondary | ICD-10-CM | POA: Diagnosis not present

## 2017-10-19 DIAGNOSIS — R51 Headache: Secondary | ICD-10-CM | POA: Diagnosis not present

## 2017-10-19 DIAGNOSIS — G459 Transient cerebral ischemic attack, unspecified: Secondary | ICD-10-CM | POA: Diagnosis not present

## 2017-10-19 DIAGNOSIS — Z9012 Acquired absence of left breast and nipple: Secondary | ICD-10-CM | POA: Diagnosis not present

## 2017-10-19 DIAGNOSIS — R5381 Other malaise: Secondary | ICD-10-CM | POA: Diagnosis not present

## 2017-10-19 DIAGNOSIS — M6281 Muscle weakness (generalized): Secondary | ICD-10-CM | POA: Diagnosis not present

## 2017-10-19 DIAGNOSIS — H2589 Other age-related cataract: Secondary | ICD-10-CM | POA: Diagnosis not present

## 2017-10-19 DIAGNOSIS — G9340 Encephalopathy, unspecified: Secondary | ICD-10-CM

## 2017-10-19 DIAGNOSIS — R262 Difficulty in walking, not elsewhere classified: Secondary | ICD-10-CM | POA: Diagnosis not present

## 2017-10-19 DIAGNOSIS — S0990XA Unspecified injury of head, initial encounter: Secondary | ICD-10-CM | POA: Diagnosis not present

## 2017-10-19 DIAGNOSIS — R404 Transient alteration of awareness: Secondary | ICD-10-CM | POA: Diagnosis not present

## 2017-10-19 DIAGNOSIS — E213 Hyperparathyroidism, unspecified: Secondary | ICD-10-CM

## 2017-10-19 DIAGNOSIS — I5030 Unspecified diastolic (congestive) heart failure: Secondary | ICD-10-CM | POA: Diagnosis not present

## 2017-10-19 DIAGNOSIS — I674 Hypertensive encephalopathy: Secondary | ICD-10-CM | POA: Diagnosis not present

## 2017-10-19 DIAGNOSIS — E212 Other hyperparathyroidism: Secondary | ICD-10-CM | POA: Diagnosis not present

## 2017-10-19 DIAGNOSIS — R4189 Other symptoms and signs involving cognitive functions and awareness: Secondary | ICD-10-CM | POA: Diagnosis not present

## 2017-10-19 DIAGNOSIS — E7849 Other hyperlipidemia: Secondary | ICD-10-CM | POA: Diagnosis not present

## 2017-10-19 DIAGNOSIS — I10 Essential (primary) hypertension: Secondary | ICD-10-CM | POA: Diagnosis not present

## 2017-10-19 DIAGNOSIS — R488 Other symbolic dysfunctions: Secondary | ICD-10-CM | POA: Diagnosis not present

## 2017-10-19 DIAGNOSIS — F0781 Postconcussional syndrome: Secondary | ICD-10-CM | POA: Diagnosis not present

## 2017-10-19 DIAGNOSIS — F339 Major depressive disorder, recurrent, unspecified: Secondary | ICD-10-CM | POA: Diagnosis not present

## 2017-10-19 DIAGNOSIS — G8911 Acute pain due to trauma: Secondary | ICD-10-CM | POA: Diagnosis not present

## 2017-10-19 NOTE — Progress Notes (Signed)
  Speech Language Pathology Treatment: Dysphagia  Patient Details Name: Sarah Cook MRN: [id number] DOB: 1951-06-29 Today's Date: 10/19/2017 Time:  -     Assessment / Plan / Recommendation Clinical Impression  Skilled observation complete with regular texture solids, thin liquids. Patient able to self feed with independent use of safe swallowing strategies, mod-max cues for sustained attention to task. Overall, appearing to be tolerating current diet. No SLP f/u indicated for dysphagia. Recommend continued f/u for cognition at SNF.    HPI HPI: Sarah Cook a 66 y.o.femalewith medical history significant ofHTN, h/o breast cancer, hyperparathyroidism and recent fall from >6 feet (06/2017) with resultant tibial plateau fracture.For the last few days she has noticed that her BP has been elevated, pain on her head on the right and posterior and increasing weakness and confusion. 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 Moderate chronic small vessel ischemic disease.      SLP Plan  Other (Comment)(dysphagia goals met)       Recommendations  Diet recommendations: Regular;Thin liquid Liquids provided via: Cup;Straw Medication Administration: Whole meds with liquid Supervision: Patient able to self feed Compensations: Slow rate;Small sips/bites Postural Changes and/or Swallow Maneuvers: Seated upright 90 degrees                Oral Care Recommendations: Oral care BID Follow up Recommendations: (none for dysphagia) SLP Visit Diagnosis: Dysphagia, unspecified (R13.10) Plan: Other (Comment)(dysphagia goals met)       Gabriel Rainwater [place], CCC-SLP [phone]                  Sarah Cook 10/19/2017, 12:25 PM

## 2017-10-19 NOTE — Care Management Note (Signed)
Case Management Note  Patient Details  Name: Sarah Cook MRN: [id number] Date of Birth: [birth year]  Subjective/Objective:                    Action/Plan: Pt discharging to [facility] today. No further needs per CM.   Expected Discharge Date:  10/18/17               Expected Discharge Plan:  Skilled Nursing Facility  In-House Referral:  Clinical Social Work  Discharge planning Services     Post Acute Care Choice:    Choice offered to:     DME Arranged:    DME Agency:     HH Arranged:    [place] Agency:     Status of Service:  Completed, signed off  If discussed at [employer], dates discussed:    Additional Comments:  Pollie Friar, RN 10/19/2017, 12:07 PM

## 2017-10-19 NOTE — Clinical Social Work Placement (Signed)
Nurse to call report to [phone], Room 117A    CLINICAL SOCIAL WORK PLACEMENT  NOTE  Date:  10/19/2017  Patient Details  Name: Sarah Cook MRN: [id number] Date of Birth: 08-30-1951  Clinical Social Work is seeking post-discharge placement for this patient at the [place] level of care (*CSW will initial, date and re-position this form in  chart as items are completed):  Yes   Patient/family provided with [place] Work Department's list of facilities offering this level of care within the geographic area requested by the patient (or if unable, by the patient's family).  Yes   Patient/family informed of their freedom to choose among providers that offer the needed level of care, that participate in Medicare, Medicaid or managed care program needed by the patient, have an available bed and are willing to accept the patient.  Yes   Patient/family informed of [place]'s ownership interest in [facility] and [facility], as well as of the fact that they are under no obligation to receive care at these facilities.  PASRR submitted to EDS on 10/13/17     PASRR number received on 10/13/17     Existing PASRR number confirmed on       FL2 transmitted to all facilities in geographic area requested by pt/family on 10/13/17     FL2 transmitted to all facilities within larger geographic area on       Patient informed that his/her managed care company has contracts with or will negotiate with certain facilities, including the following:        Yes   Patient/family informed of bed offers received.  Patient chooses bed at [facility]     Physician recommends and patient chooses bed at      Patient to be transferred to [facility] on 10/19/17.  Patient to be transferred to facility by [facility]     Patient family notified on 10/19/17 of transfer.  Name of family member notified:  Ollen Gross     PHYSICIAN Please sign FL2      Additional Comment:    _______________________________________________ Geralynn Ochs, LCSW 10/19/2017, 3:18 PM

## 2017-10-19 NOTE — Progress Notes (Signed)
Chief Complaint  Patient presents with  . Altered Mental Status   S: no events overnight. Blood pressure controlled.  O: Vitals:   10/19/17 0432 10/19/17 0900  BP: (!) 141/67 (!) 152/68  Pulse: 76 85  Resp: 20 16  Temp: 98.3 F (36.8 C) 98.4 F (36.9 C)  SpO2: 98% 95%   Gen: Well appearing, no distress Cardiovascular: Regular rate and rhythm. Normal S1 and S2. No heart murmurs present. No extra heart sounds Respiratory: Clear to auscultation bilaterally. Unlabored work of breathing. No wheezing or rales. Gastro: Soft, non-tender, non-distended, no guarding, no rebound, no masses felt Neuro: alert, oriented Psych: normal mood  A/P Active Problems:   Hyperparathyroidism (H[facility])   TIA (transient ischemic attack)   Altered mental status   Encephalopathy acute   Post-concussion headache  Continue Lasix, Imdur, Bystolic, hydralazine, irbesartan for blood pressure. Tramdol prn for pain. Hydralazine IV prn. Patient discharged on 10/18/17 pending bed availability. Still stable for discharge. Discharge summary to be updated to reflect actual date of discharge.

## 2017-10-19 NOTE — Progress Notes (Signed)
Pt and family educated on stroke information. Handout given, pt/ family verbalized understanding. d/c instrutions and education given

## 2017-10-19 NOTE — Progress Notes (Signed)
Physical Therapy Treatment Patient Details Name: Sarah Cook MRN: [id number] DOB: 23-May-1951 Today's Date: 10/19/2017    History of Present Illness 66 y.o. female with PMH of HLD, HTN, and TIA presents to the emergency department for evaluation of increasing confusion.  Patient has had worsening confusion over the past 6 months but the husband states she has been more confused over the past 24 hours. Fall from ladder in August of 2018 with resulting tibial plateau fx. Hx of total Elbow replacement and R mastectomy.     PT Comments    Pt is making progress towards her goals and is much more stable in her mobility. Pt currently modA for bed mobility, minA for transfers and minA for ambulation of 20 feet with RW. D/c plans remain appropriate. PT will continue to follow pt acutely.    Follow Up Recommendations  Supervision/Assistance - 24 hour;SNF     Equipment Recommendations  None recommended by PT    Recommendations for Other Services       Precautions / Restrictions Precautions Precautions: Fall Restrictions Weight Bearing Restrictions: No    Mobility  Bed Mobility Overal bed mobility: Needs Assistance Bed Mobility: Sit to Supine       Sit to supine: Mod assist   General bed mobility comments: modA for LE management into bed  Transfers Overall transfer level: Needs assistance Equipment used: Rolling walker (2 wheeled) Transfers: Sit to/from [facility] Sit to Stand: Min assist Stand pivot transfers: Min assist       General transfer comment: minA for powerup with stand pivot to [facility] and then sit>stand at RW   Ambulation/Gait Ambulation/Gait assistance: Min assist Ambulation Distance (Feet): 20 Feet Assistive device: Rolling walker (2 wheeled) Gait Pattern/deviations: Step-through pattern;Trunk flexed Gait velocity: slow Gait velocity interpretation: Below normal speed for age/gender General Gait Details: minA for steadying, vc for upright  posture and proximity to [facility]    Modified Rankin (Stroke Patients Only)       Balance Overall balance assessment: Needs assistance Sitting-balance support: Feet supported Sitting balance-Leahy Scale: Fair     Standing balance support: Bilateral upper extremity supported;During functional activity Standing balance-Leahy Scale: Fair Standing balance comment: requires RW to maintain stability, able to perform pericare                            Cognition Arousal/Alertness: Awake/alert Behavior During Therapy: WFL for tasks assessed/performed Overall Cognitive Status: Impaired/Different from baseline Area of Impairment: Orientation;Attention;Memory;Safety/judgement;Awareness;Problem solving                 Orientation Level: Disoriented to;Time;Situation Current Attention Level: Sustained Memory: Decreased short-term memory Following Commands: Follows one step commands consistently Safety/Judgement: Decreased awareness of safety;Decreased awareness of deficits Awareness: Emergent Problem Solving: Slow processing;Difficulty sequencing;Requires verbal cues General Comments: Pt with mild confusion      Exercises      General Comments General comments (skin integrity, edema, etc.): Pt husband present during session      Pertinent Vitals/Pain Pain Assessment: Faces Faces Pain Scale: Hurts a little bit Pain Location: head Pain Descriptors / Indicators: Headache Pain Intervention(s): Monitored during session;Limited activity within patient's tolerance           PT Goals (current goals can now be found in the care plan section) Acute Rehab PT Goals Patient Stated Goal: per husband - to find out  what is wrong PT Goal Formulation: With patient Time For Goal Achievement: 10/26/17 Potential to Achieve Goals: Fair Progress towards PT goals: Progressing toward goals    Frequency    Min 2X/week      PT  Plan Current plan remains appropriate    Co-evaluation              AM-PAC PT "6 Clicks" Daily Activity  Outcome Measure  Difficulty turning over in bed (including adjusting bedclothes, sheets and blankets)?: Unable Difficulty moving from lying on back to sitting on the side of the bed? : Unable Difficulty sitting down on and standing up from a chair with arms (e.g., wheelchair, bedside commode, etc,.)?: Unable Help needed moving to and from a bed to chair (including a wheelchair)?: A Little Help needed walking in hospital room?: A Little Help needed climbing 3-5 steps with a railing? : A Lot 6 Click Score: 11    End of Session Equipment Utilized During Treatment: Gait belt Activity Tolerance: Patient tolerated treatment well Patient left: in bed;with call bell/phone within reach;with bed alarm set;with family/visitor present Nurse Communication: Mobility status PT Visit Diagnosis: History of falling (Z91.81);Muscle weakness (generalized) (M62.81);Unsteadiness on feet (R26.81);Other abnormalities of gait and mobility (R26.89)     Time: 3903-0092 PT Time Calculation (min) (ACUTE ONLY): 30 min  Charges:  $Gait Training: 8-22 mins $Therapeutic Activity: 8-22 mins                    G Codes:       Birgit Nowling B. Migdalia Dk PT, DPT Acute Rehabilitation  [phone] Pager [phone]     [place] 10/19/2017, 5:10 PM

## 2017-11-23 DIAGNOSIS — R5381 Other malaise: Secondary | ICD-10-CM | POA: Diagnosis not present

## 2017-11-23 DIAGNOSIS — R4189 Other symptoms and signs involving cognitive functions and awareness: Secondary | ICD-10-CM | POA: Diagnosis not present

## 2017-11-23 DIAGNOSIS — R262 Difficulty in walking, not elsewhere classified: Secondary | ICD-10-CM | POA: Diagnosis not present

## 2017-12-06 ENCOUNTER — Telehealth: Payer: Self-pay | Admitting: Cardiovascular Disease

## 2017-12-06 NOTE — Telephone Encounter (Signed)
Thanks for letting me know. I hope she gets better soon.

## 2017-12-06 NOTE — Telephone Encounter (Signed)
Patient calling,  Patient states that she had a terrible fall on 07-01-17. Patient fell from the ladder and her brain was rattled and shaken as her brain hit floor. Patient has been in rehab for months.  Patient broke her femur and she wanted to make Dr. Sallyanne Kuster aware of the fall and the reason why she missed her appointment.

## 2017-12-06 NOTE — Telephone Encounter (Signed)
Returned the call to the patient. She stated that she fell back in August, broke her femur, and suffered a concussion. Since then she has suffered from memory loss and is still currently receiving at home rehab. She would like for Dr. Sallyanne Kuster to know what has been going on with her and why she hasn't been back in for an appointment.

## 2017-12-08 DIAGNOSIS — H2589 Other age-related cataract: Secondary | ICD-10-CM | POA: Diagnosis not present

## 2017-12-08 DIAGNOSIS — F339 Major depressive disorder, recurrent, unspecified: Secondary | ICD-10-CM | POA: Diagnosis not present

## 2017-12-08 DIAGNOSIS — F0781 Postconcussional syndrome: Secondary | ICD-10-CM | POA: Diagnosis not present

## 2017-12-08 DIAGNOSIS — Z9012 Acquired absence of left breast and nipple: Secondary | ICD-10-CM | POA: Diagnosis not present

## 2017-12-08 DIAGNOSIS — Z8673 Personal history of transient ischemic attack (TIA), and cerebral infarction without residual deficits: Secondary | ICD-10-CM | POA: Diagnosis not present

## 2017-12-08 DIAGNOSIS — S8253XD Displaced fracture of medial malleolus of unspecified tibia, subsequent encounter for closed fracture with routine healing: Secondary | ICD-10-CM | POA: Diagnosis not present

## 2017-12-08 DIAGNOSIS — I110 Hypertensive heart disease with heart failure: Secondary | ICD-10-CM | POA: Diagnosis not present

## 2017-12-08 DIAGNOSIS — F411 Generalized anxiety disorder: Secondary | ICD-10-CM | POA: Diagnosis not present

## 2017-12-08 DIAGNOSIS — K589 Irritable bowel syndrome without diarrhea: Secondary | ICD-10-CM | POA: Diagnosis not present

## 2017-12-08 DIAGNOSIS — I5030 Unspecified diastolic (congestive) heart failure: Secondary | ICD-10-CM | POA: Diagnosis not present

## 2017-12-08 DIAGNOSIS — M810 Age-related osteoporosis without current pathological fracture: Secondary | ICD-10-CM | POA: Diagnosis not present

## 2017-12-08 DIAGNOSIS — Z993 Dependence on wheelchair: Secondary | ICD-10-CM | POA: Diagnosis not present

## 2017-12-08 DIAGNOSIS — E213 Hyperparathyroidism, unspecified: Secondary | ICD-10-CM | POA: Diagnosis not present

## 2017-12-12 DIAGNOSIS — H2589 Other age-related cataract: Secondary | ICD-10-CM | POA: Diagnosis not present

## 2017-12-12 DIAGNOSIS — K589 Irritable bowel syndrome without diarrhea: Secondary | ICD-10-CM | POA: Diagnosis not present

## 2017-12-12 DIAGNOSIS — F339 Major depressive disorder, recurrent, unspecified: Secondary | ICD-10-CM | POA: Diagnosis not present

## 2017-12-12 DIAGNOSIS — M810 Age-related osteoporosis without current pathological fracture: Secondary | ICD-10-CM | POA: Diagnosis not present

## 2017-12-12 DIAGNOSIS — I110 Hypertensive heart disease with heart failure: Secondary | ICD-10-CM | POA: Diagnosis not present

## 2017-12-12 DIAGNOSIS — F0781 Postconcussional syndrome: Secondary | ICD-10-CM | POA: Diagnosis not present

## 2017-12-12 DIAGNOSIS — Z993 Dependence on wheelchair: Secondary | ICD-10-CM | POA: Diagnosis not present

## 2017-12-12 DIAGNOSIS — F411 Generalized anxiety disorder: Secondary | ICD-10-CM | POA: Diagnosis not present

## 2017-12-12 DIAGNOSIS — Z9012 Acquired absence of left breast and nipple: Secondary | ICD-10-CM | POA: Diagnosis not present

## 2017-12-12 DIAGNOSIS — I5030 Unspecified diastolic (congestive) heart failure: Secondary | ICD-10-CM | POA: Diagnosis not present

## 2017-12-12 DIAGNOSIS — Z8673 Personal history of transient ischemic attack (TIA), and cerebral infarction without residual deficits: Secondary | ICD-10-CM | POA: Diagnosis not present

## 2017-12-12 DIAGNOSIS — E213 Hyperparathyroidism, unspecified: Secondary | ICD-10-CM | POA: Diagnosis not present

## 2017-12-12 DIAGNOSIS — S8253XD Displaced fracture of medial malleolus of unspecified tibia, subsequent encounter for closed fracture with routine healing: Secondary | ICD-10-CM | POA: Diagnosis not present

## 2017-12-15 DIAGNOSIS — Z9012 Acquired absence of left breast and nipple: Secondary | ICD-10-CM | POA: Diagnosis not present

## 2017-12-15 DIAGNOSIS — Z993 Dependence on wheelchair: Secondary | ICD-10-CM | POA: Diagnosis not present

## 2017-12-15 DIAGNOSIS — I110 Hypertensive heart disease with heart failure: Secondary | ICD-10-CM | POA: Diagnosis not present

## 2017-12-15 DIAGNOSIS — E213 Hyperparathyroidism, unspecified: Secondary | ICD-10-CM | POA: Diagnosis not present

## 2017-12-15 DIAGNOSIS — F411 Generalized anxiety disorder: Secondary | ICD-10-CM | POA: Diagnosis not present

## 2017-12-15 DIAGNOSIS — F339 Major depressive disorder, recurrent, unspecified: Secondary | ICD-10-CM | POA: Diagnosis not present

## 2017-12-15 DIAGNOSIS — K589 Irritable bowel syndrome without diarrhea: Secondary | ICD-10-CM | POA: Diagnosis not present

## 2017-12-15 DIAGNOSIS — I5030 Unspecified diastolic (congestive) heart failure: Secondary | ICD-10-CM | POA: Diagnosis not present

## 2017-12-15 DIAGNOSIS — Z8673 Personal history of transient ischemic attack (TIA), and cerebral infarction without residual deficits: Secondary | ICD-10-CM | POA: Diagnosis not present

## 2017-12-15 DIAGNOSIS — H2589 Other age-related cataract: Secondary | ICD-10-CM | POA: Diagnosis not present

## 2017-12-15 DIAGNOSIS — F0781 Postconcussional syndrome: Secondary | ICD-10-CM | POA: Diagnosis not present

## 2017-12-15 DIAGNOSIS — S8253XD Displaced fracture of medial malleolus of unspecified tibia, subsequent encounter for closed fracture with routine healing: Secondary | ICD-10-CM | POA: Diagnosis not present

## 2017-12-15 DIAGNOSIS — M810 Age-related osteoporosis without current pathological fracture: Secondary | ICD-10-CM | POA: Diagnosis not present

## 2017-12-18 DIAGNOSIS — Z79899 Other long term (current) drug therapy: Secondary | ICD-10-CM | POA: Diagnosis not present

## 2017-12-18 DIAGNOSIS — G9340 Encephalopathy, unspecified: Secondary | ICD-10-CM | POA: Diagnosis not present

## 2017-12-18 DIAGNOSIS — I10 Essential (primary) hypertension: Secondary | ICD-10-CM | POA: Diagnosis not present

## 2017-12-18 DIAGNOSIS — I2510 Atherosclerotic heart disease of native coronary artery without angina pectoris: Secondary | ICD-10-CM | POA: Diagnosis not present

## 2017-12-18 DIAGNOSIS — G44229 Chronic tension-type headache, not intractable: Secondary | ICD-10-CM | POA: Diagnosis not present

## 2017-12-22 DIAGNOSIS — Z993 Dependence on wheelchair: Secondary | ICD-10-CM | POA: Diagnosis not present

## 2017-12-22 DIAGNOSIS — F339 Major depressive disorder, recurrent, unspecified: Secondary | ICD-10-CM | POA: Diagnosis not present

## 2017-12-22 DIAGNOSIS — S8253XD Displaced fracture of medial malleolus of unspecified tibia, subsequent encounter for closed fracture with routine healing: Secondary | ICD-10-CM | POA: Diagnosis not present

## 2017-12-22 DIAGNOSIS — Z8673 Personal history of transient ischemic attack (TIA), and cerebral infarction without residual deficits: Secondary | ICD-10-CM | POA: Diagnosis not present

## 2017-12-22 DIAGNOSIS — Z9012 Acquired absence of left breast and nipple: Secondary | ICD-10-CM | POA: Diagnosis not present

## 2017-12-22 DIAGNOSIS — K589 Irritable bowel syndrome without diarrhea: Secondary | ICD-10-CM | POA: Diagnosis not present

## 2017-12-22 DIAGNOSIS — E213 Hyperparathyroidism, unspecified: Secondary | ICD-10-CM | POA: Diagnosis not present

## 2017-12-22 DIAGNOSIS — M810 Age-related osteoporosis without current pathological fracture: Secondary | ICD-10-CM | POA: Diagnosis not present

## 2017-12-22 DIAGNOSIS — H2589 Other age-related cataract: Secondary | ICD-10-CM | POA: Diagnosis not present

## 2017-12-22 DIAGNOSIS — I110 Hypertensive heart disease with heart failure: Secondary | ICD-10-CM | POA: Diagnosis not present

## 2017-12-22 DIAGNOSIS — I5030 Unspecified diastolic (congestive) heart failure: Secondary | ICD-10-CM | POA: Diagnosis not present

## 2017-12-22 DIAGNOSIS — F411 Generalized anxiety disorder: Secondary | ICD-10-CM | POA: Diagnosis not present

## 2017-12-22 DIAGNOSIS — F0781 Postconcussional syndrome: Secondary | ICD-10-CM | POA: Diagnosis not present

## 2017-12-25 DIAGNOSIS — G9340 Encephalopathy, unspecified: Secondary | ICD-10-CM | POA: Diagnosis not present

## 2017-12-25 DIAGNOSIS — I10 Essential (primary) hypertension: Secondary | ICD-10-CM | POA: Diagnosis not present

## 2017-12-27 DIAGNOSIS — Z993 Dependence on wheelchair: Secondary | ICD-10-CM | POA: Diagnosis not present

## 2017-12-27 DIAGNOSIS — Z8673 Personal history of transient ischemic attack (TIA), and cerebral infarction without residual deficits: Secondary | ICD-10-CM | POA: Diagnosis not present

## 2017-12-27 DIAGNOSIS — F339 Major depressive disorder, recurrent, unspecified: Secondary | ICD-10-CM | POA: Diagnosis not present

## 2017-12-27 DIAGNOSIS — M810 Age-related osteoporosis without current pathological fracture: Secondary | ICD-10-CM | POA: Diagnosis not present

## 2017-12-27 DIAGNOSIS — I110 Hypertensive heart disease with heart failure: Secondary | ICD-10-CM | POA: Diagnosis not present

## 2017-12-27 DIAGNOSIS — H2589 Other age-related cataract: Secondary | ICD-10-CM | POA: Diagnosis not present

## 2017-12-27 DIAGNOSIS — S8253XD Displaced fracture of medial malleolus of unspecified tibia, subsequent encounter for closed fracture with routine healing: Secondary | ICD-10-CM | POA: Diagnosis not present

## 2017-12-27 DIAGNOSIS — F411 Generalized anxiety disorder: Secondary | ICD-10-CM | POA: Diagnosis not present

## 2017-12-27 DIAGNOSIS — E213 Hyperparathyroidism, unspecified: Secondary | ICD-10-CM | POA: Diagnosis not present

## 2017-12-27 DIAGNOSIS — F0781 Postconcussional syndrome: Secondary | ICD-10-CM | POA: Diagnosis not present

## 2017-12-27 DIAGNOSIS — K589 Irritable bowel syndrome without diarrhea: Secondary | ICD-10-CM | POA: Diagnosis not present

## 2017-12-27 DIAGNOSIS — I5030 Unspecified diastolic (congestive) heart failure: Secondary | ICD-10-CM | POA: Diagnosis not present

## 2017-12-27 DIAGNOSIS — Z9012 Acquired absence of left breast and nipple: Secondary | ICD-10-CM | POA: Diagnosis not present

## 2017-12-29 DIAGNOSIS — F339 Major depressive disorder, recurrent, unspecified: Secondary | ICD-10-CM | POA: Diagnosis not present

## 2017-12-29 DIAGNOSIS — Z8673 Personal history of transient ischemic attack (TIA), and cerebral infarction without residual deficits: Secondary | ICD-10-CM | POA: Diagnosis not present

## 2017-12-29 DIAGNOSIS — I110 Hypertensive heart disease with heart failure: Secondary | ICD-10-CM | POA: Diagnosis not present

## 2017-12-29 DIAGNOSIS — S8253XD Displaced fracture of medial malleolus of unspecified tibia, subsequent encounter for closed fracture with routine healing: Secondary | ICD-10-CM | POA: Diagnosis not present

## 2017-12-29 DIAGNOSIS — F0781 Postconcussional syndrome: Secondary | ICD-10-CM | POA: Diagnosis not present

## 2017-12-29 DIAGNOSIS — Z9012 Acquired absence of left breast and nipple: Secondary | ICD-10-CM | POA: Diagnosis not present

## 2017-12-29 DIAGNOSIS — F411 Generalized anxiety disorder: Secondary | ICD-10-CM | POA: Diagnosis not present

## 2017-12-29 DIAGNOSIS — H2589 Other age-related cataract: Secondary | ICD-10-CM | POA: Diagnosis not present

## 2017-12-29 DIAGNOSIS — K589 Irritable bowel syndrome without diarrhea: Secondary | ICD-10-CM | POA: Diagnosis not present

## 2017-12-29 DIAGNOSIS — I5030 Unspecified diastolic (congestive) heart failure: Secondary | ICD-10-CM | POA: Diagnosis not present

## 2017-12-29 DIAGNOSIS — M810 Age-related osteoporosis without current pathological fracture: Secondary | ICD-10-CM | POA: Diagnosis not present

## 2017-12-29 DIAGNOSIS — Z993 Dependence on wheelchair: Secondary | ICD-10-CM | POA: Diagnosis not present

## 2017-12-29 DIAGNOSIS — E213 Hyperparathyroidism, unspecified: Secondary | ICD-10-CM | POA: Diagnosis not present

## 2018-01-03 DIAGNOSIS — I5030 Unspecified diastolic (congestive) heart failure: Secondary | ICD-10-CM | POA: Diagnosis not present

## 2018-01-03 DIAGNOSIS — I110 Hypertensive heart disease with heart failure: Secondary | ICD-10-CM | POA: Diagnosis not present

## 2018-01-03 DIAGNOSIS — F0781 Postconcussional syndrome: Secondary | ICD-10-CM | POA: Diagnosis not present

## 2018-01-03 DIAGNOSIS — H2589 Other age-related cataract: Secondary | ICD-10-CM | POA: Diagnosis not present

## 2018-01-03 DIAGNOSIS — Z9012 Acquired absence of left breast and nipple: Secondary | ICD-10-CM | POA: Diagnosis not present

## 2018-01-03 DIAGNOSIS — F339 Major depressive disorder, recurrent, unspecified: Secondary | ICD-10-CM | POA: Diagnosis not present

## 2018-01-03 DIAGNOSIS — Z8673 Personal history of transient ischemic attack (TIA), and cerebral infarction without residual deficits: Secondary | ICD-10-CM | POA: Diagnosis not present

## 2018-01-03 DIAGNOSIS — K589 Irritable bowel syndrome without diarrhea: Secondary | ICD-10-CM | POA: Diagnosis not present

## 2018-01-03 DIAGNOSIS — E213 Hyperparathyroidism, unspecified: Secondary | ICD-10-CM | POA: Diagnosis not present

## 2018-01-03 DIAGNOSIS — F411 Generalized anxiety disorder: Secondary | ICD-10-CM | POA: Diagnosis not present

## 2018-01-03 DIAGNOSIS — Z993 Dependence on wheelchair: Secondary | ICD-10-CM | POA: Diagnosis not present

## 2018-01-03 DIAGNOSIS — M810 Age-related osteoporosis without current pathological fracture: Secondary | ICD-10-CM | POA: Diagnosis not present

## 2018-01-03 DIAGNOSIS — S8253XD Displaced fracture of medial malleolus of unspecified tibia, subsequent encounter for closed fracture with routine healing: Secondary | ICD-10-CM | POA: Diagnosis not present

## 2018-01-05 DIAGNOSIS — Z9012 Acquired absence of left breast and nipple: Secondary | ICD-10-CM | POA: Diagnosis not present

## 2018-01-05 DIAGNOSIS — F339 Major depressive disorder, recurrent, unspecified: Secondary | ICD-10-CM | POA: Diagnosis not present

## 2018-01-05 DIAGNOSIS — S8253XD Displaced fracture of medial malleolus of unspecified tibia, subsequent encounter for closed fracture with routine healing: Secondary | ICD-10-CM | POA: Diagnosis not present

## 2018-01-05 DIAGNOSIS — F0781 Postconcussional syndrome: Secondary | ICD-10-CM | POA: Diagnosis not present

## 2018-01-05 DIAGNOSIS — I5030 Unspecified diastolic (congestive) heart failure: Secondary | ICD-10-CM | POA: Diagnosis not present

## 2018-01-05 DIAGNOSIS — K589 Irritable bowel syndrome without diarrhea: Secondary | ICD-10-CM | POA: Diagnosis not present

## 2018-01-05 DIAGNOSIS — Z993 Dependence on wheelchair: Secondary | ICD-10-CM | POA: Diagnosis not present

## 2018-01-05 DIAGNOSIS — Z8673 Personal history of transient ischemic attack (TIA), and cerebral infarction without residual deficits: Secondary | ICD-10-CM | POA: Diagnosis not present

## 2018-01-05 DIAGNOSIS — H2589 Other age-related cataract: Secondary | ICD-10-CM | POA: Diagnosis not present

## 2018-01-05 DIAGNOSIS — F411 Generalized anxiety disorder: Secondary | ICD-10-CM | POA: Diagnosis not present

## 2018-01-05 DIAGNOSIS — I110 Hypertensive heart disease with heart failure: Secondary | ICD-10-CM | POA: Diagnosis not present

## 2018-01-05 DIAGNOSIS — M810 Age-related osteoporosis without current pathological fracture: Secondary | ICD-10-CM | POA: Diagnosis not present

## 2018-01-05 DIAGNOSIS — E213 Hyperparathyroidism, unspecified: Secondary | ICD-10-CM | POA: Diagnosis not present

## 2018-01-09 DIAGNOSIS — F411 Generalized anxiety disorder: Secondary | ICD-10-CM | POA: Diagnosis not present

## 2018-01-09 DIAGNOSIS — E213 Hyperparathyroidism, unspecified: Secondary | ICD-10-CM | POA: Diagnosis not present

## 2018-01-09 DIAGNOSIS — K589 Irritable bowel syndrome without diarrhea: Secondary | ICD-10-CM | POA: Diagnosis not present

## 2018-01-09 DIAGNOSIS — Z993 Dependence on wheelchair: Secondary | ICD-10-CM | POA: Diagnosis not present

## 2018-01-09 DIAGNOSIS — I5030 Unspecified diastolic (congestive) heart failure: Secondary | ICD-10-CM | POA: Diagnosis not present

## 2018-01-09 DIAGNOSIS — Z8673 Personal history of transient ischemic attack (TIA), and cerebral infarction without residual deficits: Secondary | ICD-10-CM | POA: Diagnosis not present

## 2018-01-09 DIAGNOSIS — F339 Major depressive disorder, recurrent, unspecified: Secondary | ICD-10-CM | POA: Diagnosis not present

## 2018-01-09 DIAGNOSIS — M810 Age-related osteoporosis without current pathological fracture: Secondary | ICD-10-CM | POA: Diagnosis not present

## 2018-01-09 DIAGNOSIS — F0781 Postconcussional syndrome: Secondary | ICD-10-CM | POA: Diagnosis not present

## 2018-01-09 DIAGNOSIS — H2589 Other age-related cataract: Secondary | ICD-10-CM | POA: Diagnosis not present

## 2018-01-09 DIAGNOSIS — S8253XD Displaced fracture of medial malleolus of unspecified tibia, subsequent encounter for closed fracture with routine healing: Secondary | ICD-10-CM | POA: Diagnosis not present

## 2018-01-09 DIAGNOSIS — I110 Hypertensive heart disease with heart failure: Secondary | ICD-10-CM | POA: Diagnosis not present

## 2018-01-09 DIAGNOSIS — Z9012 Acquired absence of left breast and nipple: Secondary | ICD-10-CM | POA: Diagnosis not present

## 2018-01-12 DIAGNOSIS — S8253XD Displaced fracture of medial malleolus of unspecified tibia, subsequent encounter for closed fracture with routine healing: Secondary | ICD-10-CM | POA: Diagnosis not present

## 2018-01-12 DIAGNOSIS — K589 Irritable bowel syndrome without diarrhea: Secondary | ICD-10-CM | POA: Diagnosis not present

## 2018-01-12 DIAGNOSIS — I5030 Unspecified diastolic (congestive) heart failure: Secondary | ICD-10-CM | POA: Diagnosis not present

## 2018-01-12 DIAGNOSIS — M810 Age-related osteoporosis without current pathological fracture: Secondary | ICD-10-CM | POA: Diagnosis not present

## 2018-01-12 DIAGNOSIS — H2589 Other age-related cataract: Secondary | ICD-10-CM | POA: Diagnosis not present

## 2018-01-12 DIAGNOSIS — I110 Hypertensive heart disease with heart failure: Secondary | ICD-10-CM | POA: Diagnosis not present

## 2018-01-12 DIAGNOSIS — Z993 Dependence on wheelchair: Secondary | ICD-10-CM | POA: Diagnosis not present

## 2018-01-12 DIAGNOSIS — F411 Generalized anxiety disorder: Secondary | ICD-10-CM | POA: Diagnosis not present

## 2018-01-12 DIAGNOSIS — F0781 Postconcussional syndrome: Secondary | ICD-10-CM | POA: Diagnosis not present

## 2018-01-12 DIAGNOSIS — E213 Hyperparathyroidism, unspecified: Secondary | ICD-10-CM | POA: Diagnosis not present

## 2018-01-12 DIAGNOSIS — Z8673 Personal history of transient ischemic attack (TIA), and cerebral infarction without residual deficits: Secondary | ICD-10-CM | POA: Diagnosis not present

## 2018-01-12 DIAGNOSIS — F339 Major depressive disorder, recurrent, unspecified: Secondary | ICD-10-CM | POA: Diagnosis not present

## 2018-01-12 DIAGNOSIS — Z9012 Acquired absence of left breast and nipple: Secondary | ICD-10-CM | POA: Diagnosis not present

## 2018-01-16 DIAGNOSIS — Z993 Dependence on wheelchair: Secondary | ICD-10-CM | POA: Diagnosis not present

## 2018-01-16 DIAGNOSIS — I110 Hypertensive heart disease with heart failure: Secondary | ICD-10-CM | POA: Diagnosis not present

## 2018-01-16 DIAGNOSIS — S8253XD Displaced fracture of medial malleolus of unspecified tibia, subsequent encounter for closed fracture with routine healing: Secondary | ICD-10-CM | POA: Diagnosis not present

## 2018-01-16 DIAGNOSIS — M810 Age-related osteoporosis without current pathological fracture: Secondary | ICD-10-CM | POA: Diagnosis not present

## 2018-01-16 DIAGNOSIS — H2589 Other age-related cataract: Secondary | ICD-10-CM | POA: Diagnosis not present

## 2018-01-16 DIAGNOSIS — F411 Generalized anxiety disorder: Secondary | ICD-10-CM | POA: Diagnosis not present

## 2018-01-16 DIAGNOSIS — Z9012 Acquired absence of left breast and nipple: Secondary | ICD-10-CM | POA: Diagnosis not present

## 2018-01-16 DIAGNOSIS — K589 Irritable bowel syndrome without diarrhea: Secondary | ICD-10-CM | POA: Diagnosis not present

## 2018-01-16 DIAGNOSIS — F0781 Postconcussional syndrome: Secondary | ICD-10-CM | POA: Diagnosis not present

## 2018-01-16 DIAGNOSIS — F339 Major depressive disorder, recurrent, unspecified: Secondary | ICD-10-CM | POA: Diagnosis not present

## 2018-01-16 DIAGNOSIS — E213 Hyperparathyroidism, unspecified: Secondary | ICD-10-CM | POA: Diagnosis not present

## 2018-01-16 DIAGNOSIS — Z8673 Personal history of transient ischemic attack (TIA), and cerebral infarction without residual deficits: Secondary | ICD-10-CM | POA: Diagnosis not present

## 2018-01-16 DIAGNOSIS — I5030 Unspecified diastolic (congestive) heart failure: Secondary | ICD-10-CM | POA: Diagnosis not present

## 2018-01-19 DIAGNOSIS — F0781 Postconcussional syndrome: Secondary | ICD-10-CM | POA: Diagnosis not present

## 2018-01-19 DIAGNOSIS — Z993 Dependence on wheelchair: Secondary | ICD-10-CM | POA: Diagnosis not present

## 2018-01-19 DIAGNOSIS — Z9012 Acquired absence of left breast and nipple: Secondary | ICD-10-CM | POA: Diagnosis not present

## 2018-01-19 DIAGNOSIS — I5030 Unspecified diastolic (congestive) heart failure: Secondary | ICD-10-CM | POA: Diagnosis not present

## 2018-01-19 DIAGNOSIS — S8253XD Displaced fracture of medial malleolus of unspecified tibia, subsequent encounter for closed fracture with routine healing: Secondary | ICD-10-CM | POA: Diagnosis not present

## 2018-01-19 DIAGNOSIS — F411 Generalized anxiety disorder: Secondary | ICD-10-CM | POA: Diagnosis not present

## 2018-01-19 DIAGNOSIS — M810 Age-related osteoporosis without current pathological fracture: Secondary | ICD-10-CM | POA: Diagnosis not present

## 2018-01-19 DIAGNOSIS — F339 Major depressive disorder, recurrent, unspecified: Secondary | ICD-10-CM | POA: Diagnosis not present

## 2018-01-19 DIAGNOSIS — K589 Irritable bowel syndrome without diarrhea: Secondary | ICD-10-CM | POA: Diagnosis not present

## 2018-01-19 DIAGNOSIS — H2589 Other age-related cataract: Secondary | ICD-10-CM | POA: Diagnosis not present

## 2018-01-19 DIAGNOSIS — I110 Hypertensive heart disease with heart failure: Secondary | ICD-10-CM | POA: Diagnosis not present

## 2018-01-19 DIAGNOSIS — Z8673 Personal history of transient ischemic attack (TIA), and cerebral infarction without residual deficits: Secondary | ICD-10-CM | POA: Diagnosis not present

## 2018-01-19 DIAGNOSIS — E213 Hyperparathyroidism, unspecified: Secondary | ICD-10-CM | POA: Diagnosis not present

## 2018-01-25 DIAGNOSIS — R21 Rash and other nonspecific skin eruption: Secondary | ICD-10-CM | POA: Diagnosis not present

## 2018-02-14 DIAGNOSIS — C50911 Malignant neoplasm of unspecified site of right female breast: Secondary | ICD-10-CM | POA: Diagnosis not present

## 2018-02-15 DIAGNOSIS — L989 Disorder of the skin and subcutaneous tissue, unspecified: Secondary | ICD-10-CM | POA: Diagnosis not present

## 2018-03-06 DIAGNOSIS — L989 Disorder of the skin and subcutaneous tissue, unspecified: Secondary | ICD-10-CM | POA: Diagnosis not present

## 2018-03-21 DIAGNOSIS — C50911 Malignant neoplasm of unspecified site of right female breast: Secondary | ICD-10-CM | POA: Diagnosis not present

## 2018-03-22 DIAGNOSIS — M810 Age-related osteoporosis without current pathological fracture: Secondary | ICD-10-CM | POA: Diagnosis not present

## 2018-03-22 DIAGNOSIS — I10 Essential (primary) hypertension: Secondary | ICD-10-CM | POA: Diagnosis not present

## 2018-03-22 DIAGNOSIS — E785 Hyperlipidemia, unspecified: Secondary | ICD-10-CM | POA: Diagnosis not present

## 2018-03-22 DIAGNOSIS — Z79899 Other long term (current) drug therapy: Secondary | ICD-10-CM | POA: Diagnosis not present

## 2018-03-26 DIAGNOSIS — R21 Rash and other nonspecific skin eruption: Secondary | ICD-10-CM | POA: Diagnosis not present

## 2018-03-26 DIAGNOSIS — E785 Hyperlipidemia, unspecified: Secondary | ICD-10-CM | POA: Diagnosis not present

## 2018-03-26 DIAGNOSIS — Z23 Encounter for immunization: Secondary | ICD-10-CM | POA: Diagnosis not present

## 2018-03-26 DIAGNOSIS — Z0000 Encounter for general adult medical examination without abnormal findings: Secondary | ICD-10-CM | POA: Diagnosis not present

## 2018-03-26 DIAGNOSIS — R443 Hallucinations, unspecified: Secondary | ICD-10-CM | POA: Diagnosis not present

## 2018-03-26 DIAGNOSIS — I10 Essential (primary) hypertension: Secondary | ICD-10-CM | POA: Diagnosis not present

## 2018-05-27 NOTE — Progress Notes (Signed)
Patient ID: Sarah Cook, female   DOB: 1951-06-19, 67 y.o.   MRN: [id number] Patient ID: Sarah Cook, female   DOB: [birth year], 67 y.o.   MRN: [id number]    Cardiology Office Note    Date:  05/28/2018   ID:  Sarah Cook, Sarah Cook [birth year], MRN [id number]  PCP:  Cari Caraway, MD  Cardiologist:   Sanda Klein, MD   No chief complaint on file.   History of Present Illness:  Sarah Cook is a 67 y.o. female with a history of hypertension, hyperlipidemia and mild diastolic heart failure.  August 2018 she had an accident in her home.  She fell off a ladder and had significant head injury as well as a left tibial plateau fracture.  She has had very poor short-term memory ever since.  She had a repeat hospitalization December 2018 with worsening mental status and confusion when she also had markedly elevated hypertension.  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  She has difficulty remembering when these events happened.  She tells me "it happened last week of June".  She has had to retire from teaching.  Therefore review of systems may not be entirely reliable; for what it is worth, she denies any chest pain at rest exertion, dyspnea at rest or with exertion, orthopnea, paroxysmal nocturnal dyspnea, syncope, palpitations, focal neurological deficits, intermittent claudication, lower extremity edema, unexplained weight gain, cough, hemoptysis or wheezing.  May 2012 cardiac catheterization showed no evidence of any significant coronary artery disease (not even mention of nonobstructive disease), normal left ventricular systolic function, LVEDP mildly increased at 19 mmHg. CT angiography of the chest in January 2015 showed "age advanced coronary artery atherosclerosis". Echo in 2017 showed normal left ventricular systolic function, normal regional wall motion, diastolic dysfunction with elevated left ventricular filling  pressures with markedly elevated E/e'ratios. 2017 normal stress test with moderate exercise ability (almost 7 minutes on the Bruce protocol). She had a remote mastectomy for right breast cancer treated with chemotherapy that included Adriamycin (2000). She did not receive chest radiation therapy.  She takes a statin for hyperlipidemia and has well-controlled hypertension.  She has a remote history of Bell's palsy without residual sequelae. There is a history of previous transient ischemic attack, poorly remembered and poorly defined. Her family history is significant for congestive heart failure and sudden cardiac death in her father who died at age 32.    Past Medical History:  Diagnosis Date  . Allergy   . Anxiety   . Breast cancer ([place])   . Cataract    early signs of  . Heart disease   . Hemorrhoids   . Hiatal hernia 2014   large  . Hyperlipidemia   . Hyperparathyroidism ([place])   . Hypertension   . Osteoporosis   . Parathyroid disorder ([place])   . Pulmonary nodules   . TIA (transient ischemic attack)     Past Surgical History:  Procedure Laterality Date  . BLADDER SUSPENSION    . CESAREAN SECTION     x 2  . CHOLECYSTECTOMY    . MASTECTOMY Right   . PARATHYROIDECTOMY    . TOTAL ELBOW REPLACEMENT    . TUBAL LIGATION      Current Medications: Outpatient Medications Prior to Visit  Medication Sig Dispense Refill  . acetaminophen (TYLENOL) 650 MG CR tablet Take 650 mg by mouth every 8 (eight) hours.    Marland Kitchen amitriptyline (ELAVIL) 10 MG tablet Take  10 mg by mouth at bedtime.    . Cholecalciferol (VITAMIN D3) 2000 units TABS Take 2,000 Units by mouth daily.     Marland Kitchen CINNAMON PO Take 1 tablet by mouth daily.    . Coenzyme Q10 (CO Q 10 PO) Take 1 tablet by mouth daily.    . diclofenac sodium (VOLTAREN) 1 % GEL Apply 1 application topically daily as needed.    . hydrALAZINE (APRESOLINE) 50 MG tablet Take 1 tablet (50 mg total) by mouth 3 (three) times daily.  1  . irbesartan (AVAPRO)  300 MG tablet Take 300 mg by mouth daily.    . magnesium oxide (MAG-OX) 400 MG tablet Take 400 mg by mouth daily.    . nebivolol (BYSTOLIC) 10 MG tablet Take 10 mg by mouth daily.    Marland Kitchen OVER THE COUNTER MEDICATION Take 2 capsules by mouth at bedtime. Tumeric Black pepper 5mg  tab take 1 tab by mouth daily    . Probiotic Product (PROBIOTIC PO) Take 1 capsule by mouth daily. Takes a power    . furosemide (LASIX) 40 MG tablet TAKE 1 TABLET(40 MG) BY MOUTH DAILY (Patient not taking: Reported on 10/11/2017) 90 tablet 6  . isosorbide mononitrate (IMDUR) 30 MG 24 hr tablet Take 1 tablet (30 mg total) by mouth daily.    Marland Kitchen oxyCODONE-acetaminophen (PERCOCET/ROXICET) 5-325 MG tablet Take 1 tablet by mouth every 6 (six) hours as needed for severe pain. 10 tablet 0  . potassium chloride SA (K-DUR,KLOR-CON) 20 MEQ tablet Take 2 tablets (40 mEq total) by mouth daily.    . traMADol (ULTRAM) 50 MG tablet Take 1 tablet (50 mg total) by mouth every 6 (six) hours as needed for moderate pain. 30 tablet 0   No facility-administered medications prior to visit.      Allergies:   Anesthetics, ester; Codeine; Tape; Amlodipine; Hydrochlorothiazide; Onion; and Latex   Social History   Socioeconomic History  . Marital status: Married    Spouse name: Ollen Gross  . Number of children: 3  . Years of education: college  . Highest education level: Not on file  Occupational History  . Occupation: ADMINISRATOR/ STUDENT AFFAIRS    Employer: [employer]  Social Needs  . Financial resource strain: Not on file  . Food insecurity:    Worry: Not on file    Inability: Not on file  . Transportation needs:    Medical: Not on file    Non-medical: Not on file  Tobacco Use  . Smoking status: Never Smoker  . Smokeless tobacco: Never Used  Substance and Sexual Activity  . Alcohol use: No    Comment: On Holidays  . Drug use: No  . Sexual activity: Not Currently  Lifestyle  . Physical activity:    Days per week: Not on file     Minutes per session: Not on file  . Stress: Not on file  Relationships  . Social connections:    Talks on phone: Not on file    Gets together: Not on file    Attends religious service: Not on file    Active member of club or organization: Not on file    Attends meetings of clubs or organizations: Not on file    Relationship status: Not on file  Other Topics Concern  . Not on file  Social History Narrative   Patient is married Ollen Gross) and lives at home with her husband.   Patient works at [employer].   Patient is right-handed.   Education college  Caffeine one cup daily                 Family History:  The patient's family history includes Breast cancer in her mother; Heart disease in her unknown relative; Heart failure in her father; Hypertension in her unknown relative; Sudden death in her father. Father had sudden death at age 45 and reportedly had congestive heart failure  ROS:   Please see the history of present illness.    ROS All other systems reviewed and are negative.   PHYSICAL EXAM:   VS:  BP 136/78   Pulse (!) 59   Ht 5\' 1"  (1.549 m)   Wt 130 lb 3.2 oz (59.1 kg)   BMI 24.60 kg/m     General: Alert, oriented x3, no distress, appears well Head: no evidence of trauma, PERRL, EOMI, no exophtalmos or lid lag, no myxedema, no xanthelasma; normal ears, nose and oropharynx Neck: normal jugular venous pulsations and no hepatojugular reflux; brisk carotid pulses without delay and no carotid bruits Chest: clear to auscultation, no signs of consolidation by percussion or palpation, normal fremitus, symmetrical and full respiratory excursions Cardiovascular: normal position and quality of the apical impulse, regular rhythm, normal first and second heart sounds, no murmurs, rubs or gallops Abdomen: no tenderness or distention, no masses by palpation, no abnormal pulsatility or arterial bruits, normal bowel sounds, no hepatosplenomegaly Extremities: no clubbing, cyanosis or edema;  2+ radial, ulnar and brachial pulses bilaterally; 2+ right femoral, posterior tibial and dorsalis pedis pulses; 2+ left femoral, posterior tibial and dorsalis pedis pulses; no subclavian or femoral bruits Neurological: grossly nonfocal, evident short term memory difficulty Psych: Normal mood and affect  Wt Readings from Last 3 Encounters:  05/28/18 130 lb 3.2 oz (59.1 kg)  10/11/17 165 lb (74.8 kg)  09/14/16 170 lb (77.1 kg)      Studies/Labs Reviewed:   EKG:  EKG is ordered today: shows sinus bradycardia 59 bpm, otherwise normal, QTC 388 ms  Recent Labs: 10/11/2017: ALT 14; TSH 1.084 10/12/2017: Hemoglobin 10.6; Platelets 244 10/15/2017: BUN 9; Potassium 3.3; Sodium 133 10/18/2017: Creatinine, Ser 0.68    ASSESSMENT:    1. 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[place])   2. Pure hypercholesterolemia   3. Essential hypertension      PLAN:  In order of problems listed above:  1. Diastolic HF: Asymptomatic, clinically euvolemic.  Not taking diuretics. 2. Hyperlipidemia: She is no longer taking a statin.  I wonder if this is because of her cognitive difficulties.  Seems to me that the cognitive dysfunction was clearly initiated by her head injury, but she has never had a "hard clinical event" from a vascular point of view. 3. Hypertension: well-controlled.  She only takes the hydralazine twice daily, morning and night and with her memory difficulty I think it would be unreasonable to request that she take it 3 times daily. 4. History of Adriamycin chemotherapy, without evidence of cardiomyopathy   Medication Adjustments/Labs and Tests Ordered: Current medicines are reviewed at length with the patient today.  Concerns regarding medicines are outlined above.  Medication changes, Labs and Tests ordered today are listed in the Patient Instructions below. Patient Instructions  Dr Sallyanne Kuster recommends that you schedule a follow-up appointment in 12 months. You will receive a  reminder letter in the mail two months in advance. If you don't receive a letter, please call our office to schedule the follow-up appointment.  If you need a refill on your cardiac medications before your  next appointment, please call your pharmacy.    Signed, Sanda Klein, MD  05/28/2018 9:33 AM    [facility] [place], [place], [place]  [address] Phone: [phone]; Fax: [phone]

## 2018-05-28 ENCOUNTER — Encounter: Payer: Self-pay | Admitting: Cardiovascular Disease

## 2018-05-28 ENCOUNTER — Ambulatory Visit (INDEPENDENT_AMBULATORY_CARE_PROVIDER_SITE_OTHER): Payer: BLUE CROSS/BLUE SHIELD | Admitting: Cardiovascular Disease

## 2018-05-28 VITALS — BP 136/78 | HR 59 | Ht 61.0 in | Wt 130.2 lb

## 2018-05-28 DIAGNOSIS — I5032 Chronic diastolic (congestive) heart failure: Secondary | ICD-10-CM

## 2018-05-28 DIAGNOSIS — E7800 Pure hypercholesterolemia, unspecified: Secondary | ICD-10-CM

## 2018-05-28 DIAGNOSIS — I10 Essential (primary) hypertension: Secondary | ICD-10-CM | POA: Diagnosis not present

## 2018-05-28 NOTE — Patient Instructions (Signed)
Dr Croitoru recommends that you schedule a follow-up appointment in 12 months. You will receive a reminder letter in the mail two months in advance. If you don't receive a letter, please call our office to schedule the follow-up appointment.  If you need a refill on your cardiac medications before your next appointment, please call your pharmacy. 

## 2018-06-14 DIAGNOSIS — N958 Other specified menopausal and perimenopausal disorders: Secondary | ICD-10-CM | POA: Diagnosis not present

## 2018-06-14 DIAGNOSIS — M816 Localized osteoporosis [Lequesne]: Secondary | ICD-10-CM | POA: Diagnosis not present

## 2018-06-14 DIAGNOSIS — Z1231 Encounter for screening mammogram for malignant neoplasm of breast: Secondary | ICD-10-CM | POA: Diagnosis not present

## 2018-06-14 DIAGNOSIS — Z01419 Encounter for gynecological examination (general) (routine) without abnormal findings: Secondary | ICD-10-CM | POA: Diagnosis not present

## 2018-06-14 DIAGNOSIS — Z6825 Body mass index (BMI) 25.0-25.9, adult: Secondary | ICD-10-CM | POA: Diagnosis not present

## 2018-07-12 DIAGNOSIS — R262 Difficulty in walking, not elsewhere classified: Secondary | ICD-10-CM | POA: Diagnosis not present

## 2018-07-12 DIAGNOSIS — R21 Rash and other nonspecific skin eruption: Secondary | ICD-10-CM | POA: Diagnosis not present

## 2018-07-12 DIAGNOSIS — J309 Allergic rhinitis, unspecified: Secondary | ICD-10-CM | POA: Diagnosis not present

## 2018-07-24 DIAGNOSIS — M818 Other osteoporosis without current pathological fracture: Secondary | ICD-10-CM | POA: Diagnosis not present

## 2018-07-31 DIAGNOSIS — L308 Other specified dermatitis: Secondary | ICD-10-CM | POA: Diagnosis not present

## 2018-07-31 DIAGNOSIS — L810 Postinflammatory hyperpigmentation: Secondary | ICD-10-CM | POA: Diagnosis not present

## 2018-07-31 DIAGNOSIS — B351 Tinea unguium: Secondary | ICD-10-CM | POA: Diagnosis not present

## 2018-08-09 DIAGNOSIS — C50911 Malignant neoplasm of unspecified site of right female breast: Secondary | ICD-10-CM | POA: Diagnosis not present

## 2018-08-16 DIAGNOSIS — C50911 Malignant neoplasm of unspecified site of right female breast: Secondary | ICD-10-CM | POA: Diagnosis not present

## 2018-09-27 DIAGNOSIS — E210 Primary hyperparathyroidism: Secondary | ICD-10-CM | POA: Diagnosis not present

## 2018-09-27 DIAGNOSIS — R6889 Other general symptoms and signs: Secondary | ICD-10-CM | POA: Diagnosis not present

## 2018-09-27 DIAGNOSIS — E785 Hyperlipidemia, unspecified: Secondary | ICD-10-CM | POA: Diagnosis not present

## 2018-09-27 DIAGNOSIS — I10 Essential (primary) hypertension: Secondary | ICD-10-CM | POA: Diagnosis not present

## 2018-12-27 DIAGNOSIS — E785 Hyperlipidemia, unspecified: Secondary | ICD-10-CM | POA: Diagnosis not present

## 2019-02-11 DIAGNOSIS — L259 Unspecified contact dermatitis, unspecified cause: Secondary | ICD-10-CM | POA: Diagnosis not present

## 2019-03-07 DIAGNOSIS — R51 Headache: Secondary | ICD-10-CM | POA: Diagnosis not present

## 2019-03-08 DIAGNOSIS — R51 Headache: Secondary | ICD-10-CM | POA: Diagnosis not present

## 2019-04-08 DIAGNOSIS — Z1211 Encounter for screening for malignant neoplasm of colon: Secondary | ICD-10-CM | POA: Diagnosis not present

## 2019-04-08 DIAGNOSIS — R413 Other amnesia: Secondary | ICD-10-CM | POA: Diagnosis not present

## 2019-04-08 DIAGNOSIS — E785 Hyperlipidemia, unspecified: Secondary | ICD-10-CM | POA: Diagnosis not present

## 2019-04-08 DIAGNOSIS — M810 Age-related osteoporosis without current pathological fracture: Secondary | ICD-10-CM | POA: Diagnosis not present

## 2019-04-08 DIAGNOSIS — Z0000 Encounter for general adult medical examination without abnormal findings: Secondary | ICD-10-CM | POA: Diagnosis not present

## 2019-04-08 DIAGNOSIS — I10 Essential (primary) hypertension: Secondary | ICD-10-CM | POA: Diagnosis not present

## 2019-06-17 DIAGNOSIS — Z01419 Encounter for gynecological examination (general) (routine) without abnormal findings: Secondary | ICD-10-CM | POA: Diagnosis not present

## 2019-06-17 DIAGNOSIS — Z6828 Body mass index (BMI) 28.0-28.9, adult: Secondary | ICD-10-CM | POA: Diagnosis not present

## 2019-06-18 ENCOUNTER — Other Ambulatory Visit: Payer: Self-pay | Admitting: Obstetrics and Gynecology

## 2019-06-18 DIAGNOSIS — N6320 Unspecified lump in the left breast, unspecified quadrant: Secondary | ICD-10-CM

## 2019-06-20 ENCOUNTER — Ambulatory Visit
Admission: RE | Admit: 2019-06-20 | Discharge: 2019-06-20 | Disposition: A | Discharge status: Home / Self Care | Payer: BLUE CROSS/BLUE SHIELD | Source: Ambulatory Visit | Attending: Obstetrics and Gynecology | Admitting: Obstetrics and Gynecology

## 2019-06-20 ENCOUNTER — Ambulatory Visit
Admission: RE | Admit: 2019-06-20 | Discharge: 2019-06-20 | Disposition: A | Discharge status: Home / Self Care | Payer: Medicare Other | Source: Ambulatory Visit | Attending: Obstetrics and Gynecology | Admitting: Obstetrics and Gynecology

## 2019-06-20 ENCOUNTER — Other Ambulatory Visit: Payer: Self-pay

## 2019-06-20 DIAGNOSIS — N644 Mastodynia: Secondary | ICD-10-CM | POA: Diagnosis not present

## 2019-06-20 DIAGNOSIS — N6320 Unspecified lump in the left breast, unspecified quadrant: Secondary | ICD-10-CM

## 2019-07-18 ENCOUNTER — Other Ambulatory Visit: Payer: Self-pay

## 2019-07-18 ENCOUNTER — Encounter: Payer: Self-pay | Admitting: Cardiovascular Disease

## 2019-07-18 ENCOUNTER — Ambulatory Visit (INDEPENDENT_AMBULATORY_CARE_PROVIDER_SITE_OTHER): Payer: BC Managed Care – PPO | Admitting: Cardiovascular Disease

## 2019-07-18 VITALS — BP 142/79 | HR 71 | Ht 61.5 in | Wt 148.0 lb

## 2019-07-18 DIAGNOSIS — I2510 Atherosclerotic heart disease of native coronary artery without angina pectoris: Secondary | ICD-10-CM | POA: Diagnosis not present

## 2019-07-18 DIAGNOSIS — I10 Essential (primary) hypertension: Secondary | ICD-10-CM | POA: Diagnosis not present

## 2019-07-18 DIAGNOSIS — I5032 Chronic diastolic (congestive) heart failure: Secondary | ICD-10-CM | POA: Diagnosis not present

## 2019-07-18 DIAGNOSIS — E7800 Pure hypercholesterolemia, unspecified: Secondary | ICD-10-CM | POA: Diagnosis not present

## 2019-07-18 NOTE — Patient Instructions (Signed)
Medication Instructions:  Your physician recommends that you continue on your current medications as directed. Please refer to the Current Medication list given to you today.  If you need a refill on your cardiac medications before your next appointment, please call your pharmacy.   Lab work: NONE If you have labs (blood work) drawn today and your tests are completely normal, you will receive your results only by: Marland Kitchen MyChart Message (if you have MyChart) OR . A paper copy in the mail If you have any lab test that is abnormal or we need to change your treatment, we will call you to review the results.  Testing/Procedures: NONE  Follow-Up: At [facility], you and your health needs are our priority.  As part of our continuing mission to provide you with exceptional heart care, we have created designated Provider Care Teams.  These Care Teams include your primary Cardiologist (physician) and Advanced Practice Providers (APPs -  Physician Assistants and Nurse Practitioners) who all work together to provide you with the care you need, when you need it. You will need a follow up appointment in 12 months.  Please call our office 2 months in advance to schedule this appointment.  You may see Dr. Sallyanne Kuster or one of the following Advanced Practice Providers on your designated Care Team: Almyra Deforest, [place] . Fabian Sharp, PA-C  Any Other Special Instructions Will Be Listed Below (If Applicable).  Keep a daily blood pressure log for one week and then send your blood pressure readings/results through MyChart or via mail.

## 2019-07-18 NOTE — Progress Notes (Signed)
Patient ID: Sarah Cook, female   DOB: [birth year], 68 y.o.   MRN: [id number] Patient ID: Sarah Cook, female   DOB: [birth year], 68 y.o.   MRN: [id number]    Cardiology Office Note    Date:  07/18/2019   ID:  Sarah Cook [birth year], MRN [id number]  PCP:  Cari Caraway, MD  Cardiologist:   Sanda Klein, MD   No chief complaint on file.   History of Present Illness:  Sarah Cook is a 68 y.o. female with a history of hypertension, hyperlipidemia and mild diastolic heart failure.  She has been unable to go to the [facility], where she was exercising 6 days a week, but she make sure to continue exercising at home.  She walks left in the hospital 30 minutes a day and also uses a pedaling device while sitting down.   August 2018 she had an accident in her home.  She fell off a ladder and had significant head injury as well as a left tibial plateau fracture.  She has had very poor short-term memory ever since.  She had a repeat hospitalization December 2018 with worsening mental status and confusion when she also had markedly elevated hypertension.  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  She has difficulty remembering when these events happened.  She tells me "it happened last week of June".  She has had to retire from teaching.  She has some memory difficulties since her head injury and review of systems may not be entirely reliable. The patient specifically denies any chest pain at rest exertion, dyspnea at rest or with exertion, orthopnea, paroxysmal nocturnal dyspnea, syncope, palpitations, focal neurological deficits, intermittent claudication, lower extremity edema, unexplained weight gain, cough, hemoptysis or wheezing.   May 2012 cardiac catheterization showed no evidence of any significant coronary artery disease (not even mention of nonobstructive disease), normal left ventricular systolic function,  LVEDP mildly increased at 19 mmHg. CT angiography of the chest in January 2015 showed "age advanced coronary artery atherosclerosis". Echo in 2017 showed normal left ventricular systolic function, normal regional wall motion, diastolic dysfunction with elevated left ventricular filling pressures with markedly elevated E/e'ratios. 2017 normal stress test with moderate exercise ability (almost 7 minutes on the Bruce protocol). She had a remote mastectomy for right breast cancer treated with chemotherapy that included Adriamycin (2000). She did not receive chest radiation therapy.  She previously took a statin for hyperlipidemia but she stopped it for cognitive dysfunction problems.  She has well-controlled hypertension.  She has a remote history of Bell's palsy without residual sequelae. There is a history of previous transient ischemic attack, poorly remembered and poorly defined. Her family history is significant for congestive heart failure and sudden cardiac death in her father who died at age 30.    Past Medical History:  Diagnosis Date   Allergy    Anxiety    Breast cancer ([place])    Cataract    early signs of   Heart disease    Hemorrhoids    Hiatal hernia 2014   large   Hyperlipidemia    Hyperparathyroidism ([place])    Hypertension    Osteoporosis    Parathyroid disorder ([place])    Pulmonary nodules    TIA (transient ischemic attack)     Past Surgical History:  Procedure Laterality Date   BLADDER SUSPENSION     CESAREAN SECTION     x 2   CHOLECYSTECTOMY  MASTECTOMY Right    PARATHYROIDECTOMY     TOTAL ELBOW REPLACEMENT     TUBAL LIGATION      Current Medications: Outpatient Medications Prior to Visit  Medication Sig Dispense Refill   acetaminophen (TYLENOL) 650 MG CR tablet Take 650 mg by mouth every 8 (eight) hours.     amitriptyline (ELAVIL) 10 MG tablet Take 10 mg by mouth at bedtime.     Cholecalciferol (VITAMIN D3) 2000 units TABS Take 2,000  Units by mouth daily.      CINNAMON PO Take 1 tablet by mouth daily.     Coenzyme Q10 (CO Q 10 PO) Take 1 tablet by mouth daily.     diclofenac sodium (VOLTAREN) 1 % GEL Apply 1 application topically daily as needed.     hydrALAZINE (APRESOLINE) 50 MG tablet Take 1 tablet (50 mg total) by mouth 3 (three) times daily.  1   irbesartan (AVAPRO) 300 MG tablet Take 300 mg by mouth daily.     magnesium oxide (MAG-OX) 400 MG tablet Take 400 mg by mouth daily.     nebivolol (BYSTOLIC) 10 MG tablet Take 10 mg by mouth daily.     OVER THE COUNTER MEDICATION Take 2 capsules by mouth at bedtime. Tumeric Black pepper 5mg  tab take 1 tab by mouth daily     Probiotic Product (PROBIOTIC PO) Take 1 capsule by mouth daily. Takes a power     No facility-administered medications prior to visit.      Allergies:   Anesthetics, ester; Codeine; Tape; Amlodipine; Hydrochlorothiazide; Onion; and Latex   Social History   Socioeconomic History   Marital status: Married    Spouse name: Ollen Gross   Number of children: 3   Years of education: college   Highest education level: Not on file  Occupational History   Occupation: ADMINISRATOR/ STUDENT AFFAIRS    Employer: [employer] resource strain: Not on file   Food insecurity    Worry: Not on file    Inability: Not on file   Transportation needs    Medical: Not on file    Non-medical: Not on file  Tobacco Use   Smoking status: Never Smoker   Smokeless tobacco: Never Used  Substance and Sexual Activity   Alcohol use: No    Comment: On Holidays   Drug use: No   Sexual activity: Not Currently  Lifestyle   Physical activity    Days per week: Not on file    Minutes per session: Not on file   Stress: Not on file  Relationships   Social connections    Talks on phone: Not on file    Gets together: Not on file    Attends religious service: Not on file    Active member of club or organization: Not on file     Attends meetings of clubs or organizations: Not on file    Relationship status: Not on file  Other Topics Concern   Not on file  Social History Narrative   Patient is married Ollen Gross) and lives at home with her husband.   Patient works at [employer].   Patient is right-handed.   Education college   Caffeine one cup daily                 Family History:  The patient's family history includes Breast cancer in her mother; Heart disease in an other family member; Heart failure in her father; Hypertension in an other family  member; Sudden death in her father. Father had sudden death at age 44 and reportedly had congestive heart failure  ROS:   Please see the history of present illness.    ROS all other systems are reviewed and are negative  PHYSICAL EXAM:   VS:  BP (!) 142/79    Pulse 71    Ht 5' 1.5" (1.562 m)    Wt 148 lb (67.1 kg)    SpO2 97%    BMI 27.51 kg/m      General: Alert, oriented x3, no distress, overweight Head: no evidence of trauma, PERRL, EOMI, no exophtalmos or lid lag, no myxedema, no xanthelasma; normal ears, nose and oropharynx Neck: normal jugular venous pulsations and no hepatojugular reflux; brisk carotid pulses without delay and no carotid bruits Chest: clear to auscultation, no signs of consolidation by percussion or palpation, normal fremitus, symmetrical and full respiratory excursions Cardiovascular: normal position and quality of the apical impulse, regular rhythm, normal first and second heart sounds, no murmurs, rubs or gallops Abdomen: no tenderness or distention, no masses by palpation, no abnormal pulsatility or arterial bruits, normal bowel sounds, no hepatosplenomegaly Extremities: no clubbing, cyanosis or edema; 2+ radial, ulnar and brachial pulses bilaterally; 2+ right femoral, posterior tibial and dorsalis pedis pulses; 2+ left femoral, posterior tibial and dorsalis pedis pulses; no subclavian or femoral bruits Neurological: grossly nonfocal Psych:  Normal mood and affect   Wt Readings from Last 3 Encounters:  07/18/19 148 lb (67.1 kg)  05/28/18 130 lb 3.2 oz (59.1 kg)  10/11/17 165 lb (74.8 kg)      Studies/Labs Reviewed:   EKG:  EKG is ordered today: shows sinus bradycardia 59 bpm, otherwise normal, QTC 388 ms  Recent Labs: No results found for requested labs within last 8760 hours.   Lipid Panel     Component Value Date/Time   CHOL 195 10/12/2017 0241   TRIG 145 10/12/2017 0241   HDL 40 (L) 10/12/2017 0241   CHOLHDL 4.9 10/12/2017 0241   VLDL 29 10/12/2017 0241   LDLCALC 126 (H) 10/12/2017 0241    ASSESSMENT:    1. Chronic diastolic heart failure ([place])   2. Essential hypertension   3. Hypercholesterolemia   4. Atherosclerosis of native coronary artery of native heart without angina pectoris      PLAN:  In order of problems listed above:  1. Diastolic HF: Asymptomatic, clinically euvolemic.  Not taking diuretics. 2. Prominent LAD artery calcification seen on CT 3. Hyperlipidemia: Stopped taking statins after her cognitive difficulties.  I think it is fairly evident that these began following a head injury,but nevertheless she prefers to avoid anything that would interfere with cognition.  She has only mildly elevated LDL cholesterol and has never had any coronary events, although she has evidence of atherosclerosis by imaging studies.  Try to maintain lower lipid levels through healthy lifestyle. 4. Hypertension: She reports that this is better controlled at home, although she cannot recall actual blood pressure values.  I asked her to send me some records of her: Blood pressure log. 5. History of Adriamycin chemotherapy, without evidence of cardiomyopathy   Medication Adjustments/Labs and Tests Ordered: Current medicines are reviewed at length with the patient today.  Concerns regarding medicines are outlined above.  Medication changes, Labs and Tests ordered today are listed in the Patient Instructions  below. Patient Instructions  Medication Instructions:  Your physician recommends that you continue on your current medications as directed. Please refer to the Current Medication list given  to you today.  If you need a refill on your cardiac medications before your next appointment, please call your pharmacy.   Lab work: NONE If you have labs (blood work) drawn today and your tests are completely normal, you will receive your results only by:  [place] (if you have MyChart) OR  A paper copy in the mail If you have any lab test that is abnormal or we need to change your treatment, we will call you to review the results.  Testing/Procedures: NONE  Follow-Up: At [facility], you and your health needs are our priority.  As part of our continuing mission to provide you with exceptional heart care, we have created designated Provider Care Teams.  These Care Teams include your primary Cardiologist (physician) and Advanced Practice Providers (APPs -  Physician Assistants and Nurse Practitioners) who all work together to provide you with the care you need, when you need it. You will need a follow up appointment in 12 months.  Please call our office 2 months in advance to schedule this appointment.  You may see Dr. Sallyanne Kuster or one of the following Advanced Practice Providers on your designated Care Team: Almyra Deforest, [place]  Fabian Sharp, [place]  Any Other Special Instructions Will Be Listed Below (If Applicable).  Keep a daily blood pressure log for one week and then send your blood pressure readings/results through MyChart or via mail.        Signed, Sanda Klein, MD  07/18/2019 8:56 AM    [facility] [place], [place], [place]  [address] Phone: [phone]; Fax: [phone]

## 2019-07-29 ENCOUNTER — Encounter: Payer: Self-pay | Admitting: *Deleted

## 2019-11-25 ENCOUNTER — Other Ambulatory Visit: Payer: Medicare PPO

## 2019-12-02 ENCOUNTER — Ambulatory Visit: Payer: Medicare PPO | Attending: Internal Medicine

## 2019-12-02 DIAGNOSIS — Z23 Encounter for immunization: Secondary | ICD-10-CM | POA: Insufficient documentation

## 2019-12-02 NOTE — Progress Notes (Signed)
   Covid-19 Vaccination Clinic  Name:  Sarah Cook    MRN: [id number] DOB: 08-03-1951  12/02/2019  [name] was observed post Covid-19 immunization for 15 minutes without incidence. She was provided with Vaccine Information Sheet and instruction to access the V-Safe system.   [name] was instructed to call 911 with any severe reactions post vaccine: Marland Kitchen Difficulty breathing  . Swelling of your face and throat  . A fast heartbeat  . A bad rash all over your body  . Dizziness and weakness    Immunizations Administered    Name Date Dose VIS Date Route   Pfizer COVID-19 Vaccine 12/02/2019 10:47 AM 0.3 mL 10/18/2019 Intramuscular   Manufacturer: [place]   Lot: [id number]   [place]: [id number]

## 2019-12-23 ENCOUNTER — Ambulatory Visit: Payer: Medicare PPO | Attending: Internal Medicine

## 2019-12-23 DIAGNOSIS — Z23 Encounter for immunization: Secondary | ICD-10-CM | POA: Insufficient documentation

## 2019-12-23 NOTE — Progress Notes (Signed)
   Covid-19 Vaccination Clinic  Name:  [name]    MRN: [id number] DOB: May 29, 1951  12/23/2019  [name] was observed post Covid-19 immunization for 15 minutes without incidence. She was provided with Vaccine Information Sheet and instruction to access the V-Safe system.   [name] was instructed to call 911 with any severe reactions post vaccine: Marland Kitchen Difficulty breathing  . Swelling of your face and throat  . A fast heartbeat  . A bad rash all over your body  . Dizziness and weakness    Immunizations Administered    Name Date Dose VIS Date Route   Pfizer COVID-19 Vaccine 12/23/2019 11:07 AM 0.3 mL 10/18/2019 Intramuscular   Manufacturer: [place]   Lot: [id number]   [place]: [id number]

## 2020-08-13 ENCOUNTER — Telehealth: Payer: Self-pay | Admitting: Cardiovascular Disease

## 2020-08-13 NOTE — Telephone Encounter (Signed)
lvm for patient to return call to get follow up scheduled with [facility] from recall list

## 2020-08-14 NOTE — Telephone Encounter (Signed)
lvm for patient to return call to get follow up scheduled from recall list

## 2020-08-24 DIAGNOSIS — Z1231 Encounter for screening mammogram for malignant neoplasm of breast: Secondary | ICD-10-CM | POA: Diagnosis not present

## 2020-08-24 DIAGNOSIS — Z01419 Encounter for gynecological examination (general) (routine) without abnormal findings: Secondary | ICD-10-CM | POA: Diagnosis not present

## 2020-08-24 DIAGNOSIS — Z6832 Body mass index (BMI) 32.0-32.9, adult: Secondary | ICD-10-CM | POA: Diagnosis not present

## 2020-09-07 DIAGNOSIS — M816 Localized osteoporosis [Lequesne]: Secondary | ICD-10-CM | POA: Diagnosis not present

## 2020-09-07 DIAGNOSIS — N958 Other specified menopausal and perimenopausal disorders: Secondary | ICD-10-CM | POA: Diagnosis not present

## 2020-10-29 DIAGNOSIS — Z20822 Contact with and (suspected) exposure to covid-19: Secondary | ICD-10-CM | POA: Diagnosis not present

## 2021-03-09 DIAGNOSIS — S7002XA Contusion of left hip, initial encounter: Secondary | ICD-10-CM | POA: Diagnosis not present

## 2021-03-09 DIAGNOSIS — W19XXXA Unspecified fall, initial encounter: Secondary | ICD-10-CM | POA: Diagnosis not present

## 2021-03-17 DIAGNOSIS — R413 Other amnesia: Secondary | ICD-10-CM | POA: Diagnosis not present

## 2021-03-17 DIAGNOSIS — M25552 Pain in left hip: Secondary | ICD-10-CM | POA: Diagnosis not present

## 2021-03-17 DIAGNOSIS — R52 Pain, unspecified: Secondary | ICD-10-CM | POA: Diagnosis not present

## 2021-03-26 DIAGNOSIS — M19041 Primary osteoarthritis, right hand: Secondary | ICD-10-CM | POA: Diagnosis not present

## 2021-03-26 DIAGNOSIS — E210 Primary hyperparathyroidism: Secondary | ICD-10-CM | POA: Diagnosis not present

## 2021-03-26 DIAGNOSIS — I10 Essential (primary) hypertension: Secondary | ICD-10-CM | POA: Diagnosis not present

## 2021-03-26 DIAGNOSIS — K5790 Diverticulosis of intestine, part unspecified, without perforation or abscess without bleeding: Secondary | ICD-10-CM | POA: Diagnosis not present

## 2021-03-26 DIAGNOSIS — I2510 Atherosclerotic heart disease of native coronary artery without angina pectoris: Secondary | ICD-10-CM | POA: Diagnosis not present

## 2021-03-26 DIAGNOSIS — M810 Age-related osteoporosis without current pathological fracture: Secondary | ICD-10-CM | POA: Diagnosis not present

## 2021-03-26 DIAGNOSIS — E785 Hyperlipidemia, unspecified: Secondary | ICD-10-CM | POA: Diagnosis not present

## 2021-03-26 DIAGNOSIS — M19042 Primary osteoarthritis, left hand: Secondary | ICD-10-CM | POA: Diagnosis not present

## 2021-03-26 DIAGNOSIS — E669 Obesity, unspecified: Secondary | ICD-10-CM | POA: Diagnosis not present

## 2021-03-30 DIAGNOSIS — I2510 Atherosclerotic heart disease of native coronary artery without angina pectoris: Secondary | ICD-10-CM | POA: Diagnosis not present

## 2021-03-30 DIAGNOSIS — I10 Essential (primary) hypertension: Secondary | ICD-10-CM | POA: Diagnosis not present

## 2021-03-30 DIAGNOSIS — M810 Age-related osteoporosis without current pathological fracture: Secondary | ICD-10-CM | POA: Diagnosis not present

## 2021-03-30 DIAGNOSIS — K5790 Diverticulosis of intestine, part unspecified, without perforation or abscess without bleeding: Secondary | ICD-10-CM | POA: Diagnosis not present

## 2021-03-30 DIAGNOSIS — M19042 Primary osteoarthritis, left hand: Secondary | ICD-10-CM | POA: Diagnosis not present

## 2021-03-30 DIAGNOSIS — E210 Primary hyperparathyroidism: Secondary | ICD-10-CM | POA: Diagnosis not present

## 2021-03-30 DIAGNOSIS — M19041 Primary osteoarthritis, right hand: Secondary | ICD-10-CM | POA: Diagnosis not present

## 2021-03-30 DIAGNOSIS — E669 Obesity, unspecified: Secondary | ICD-10-CM | POA: Diagnosis not present

## 2021-03-30 DIAGNOSIS — E785 Hyperlipidemia, unspecified: Secondary | ICD-10-CM | POA: Diagnosis not present

## 2021-04-08 DIAGNOSIS — E210 Primary hyperparathyroidism: Secondary | ICD-10-CM | POA: Diagnosis not present

## 2021-04-08 DIAGNOSIS — I10 Essential (primary) hypertension: Secondary | ICD-10-CM | POA: Diagnosis not present

## 2021-04-08 DIAGNOSIS — E669 Obesity, unspecified: Secondary | ICD-10-CM | POA: Diagnosis not present

## 2021-04-08 DIAGNOSIS — K5790 Diverticulosis of intestine, part unspecified, without perforation or abscess without bleeding: Secondary | ICD-10-CM | POA: Diagnosis not present

## 2021-04-08 DIAGNOSIS — E785 Hyperlipidemia, unspecified: Secondary | ICD-10-CM | POA: Diagnosis not present

## 2021-04-08 DIAGNOSIS — M19041 Primary osteoarthritis, right hand: Secondary | ICD-10-CM | POA: Diagnosis not present

## 2021-04-08 DIAGNOSIS — M810 Age-related osteoporosis without current pathological fracture: Secondary | ICD-10-CM | POA: Diagnosis not present

## 2021-04-08 DIAGNOSIS — I2510 Atherosclerotic heart disease of native coronary artery without angina pectoris: Secondary | ICD-10-CM | POA: Diagnosis not present

## 2021-04-08 DIAGNOSIS — M19042 Primary osteoarthritis, left hand: Secondary | ICD-10-CM | POA: Diagnosis not present

## 2021-04-14 DIAGNOSIS — E785 Hyperlipidemia, unspecified: Secondary | ICD-10-CM | POA: Diagnosis not present

## 2021-04-14 DIAGNOSIS — E669 Obesity, unspecified: Secondary | ICD-10-CM | POA: Diagnosis not present

## 2021-04-14 DIAGNOSIS — M19041 Primary osteoarthritis, right hand: Secondary | ICD-10-CM | POA: Diagnosis not present

## 2021-04-14 DIAGNOSIS — M810 Age-related osteoporosis without current pathological fracture: Secondary | ICD-10-CM | POA: Diagnosis not present

## 2021-04-14 DIAGNOSIS — E210 Primary hyperparathyroidism: Secondary | ICD-10-CM | POA: Diagnosis not present

## 2021-04-14 DIAGNOSIS — I2510 Atherosclerotic heart disease of native coronary artery without angina pectoris: Secondary | ICD-10-CM | POA: Diagnosis not present

## 2021-04-14 DIAGNOSIS — M19042 Primary osteoarthritis, left hand: Secondary | ICD-10-CM | POA: Diagnosis not present

## 2021-04-14 DIAGNOSIS — I10 Essential (primary) hypertension: Secondary | ICD-10-CM | POA: Diagnosis not present

## 2021-04-14 DIAGNOSIS — K5790 Diverticulosis of intestine, part unspecified, without perforation or abscess without bleeding: Secondary | ICD-10-CM | POA: Diagnosis not present

## 2021-04-21 DIAGNOSIS — E785 Hyperlipidemia, unspecified: Secondary | ICD-10-CM | POA: Diagnosis not present

## 2021-04-21 DIAGNOSIS — K5790 Diverticulosis of intestine, part unspecified, without perforation or abscess without bleeding: Secondary | ICD-10-CM | POA: Diagnosis not present

## 2021-04-21 DIAGNOSIS — M19041 Primary osteoarthritis, right hand: Secondary | ICD-10-CM | POA: Diagnosis not present

## 2021-04-21 DIAGNOSIS — I10 Essential (primary) hypertension: Secondary | ICD-10-CM | POA: Diagnosis not present

## 2021-04-21 DIAGNOSIS — M810 Age-related osteoporosis without current pathological fracture: Secondary | ICD-10-CM | POA: Diagnosis not present

## 2021-04-21 DIAGNOSIS — I2510 Atherosclerotic heart disease of native coronary artery without angina pectoris: Secondary | ICD-10-CM | POA: Diagnosis not present

## 2021-04-21 DIAGNOSIS — E669 Obesity, unspecified: Secondary | ICD-10-CM | POA: Diagnosis not present

## 2021-04-21 DIAGNOSIS — E210 Primary hyperparathyroidism: Secondary | ICD-10-CM | POA: Diagnosis not present

## 2021-04-21 DIAGNOSIS — M19042 Primary osteoarthritis, left hand: Secondary | ICD-10-CM | POA: Diagnosis not present

## 2021-04-27 DIAGNOSIS — E669 Obesity, unspecified: Secondary | ICD-10-CM | POA: Diagnosis not present

## 2021-04-27 DIAGNOSIS — I2510 Atherosclerotic heart disease of native coronary artery without angina pectoris: Secondary | ICD-10-CM | POA: Diagnosis not present

## 2021-04-27 DIAGNOSIS — E210 Primary hyperparathyroidism: Secondary | ICD-10-CM | POA: Diagnosis not present

## 2021-04-27 DIAGNOSIS — K5790 Diverticulosis of intestine, part unspecified, without perforation or abscess without bleeding: Secondary | ICD-10-CM | POA: Diagnosis not present

## 2021-04-27 DIAGNOSIS — M19042 Primary osteoarthritis, left hand: Secondary | ICD-10-CM | POA: Diagnosis not present

## 2021-04-27 DIAGNOSIS — E785 Hyperlipidemia, unspecified: Secondary | ICD-10-CM | POA: Diagnosis not present

## 2021-04-27 DIAGNOSIS — M19041 Primary osteoarthritis, right hand: Secondary | ICD-10-CM | POA: Diagnosis not present

## 2021-04-27 DIAGNOSIS — I10 Essential (primary) hypertension: Secondary | ICD-10-CM | POA: Diagnosis not present

## 2021-04-27 DIAGNOSIS — M810 Age-related osteoporosis without current pathological fracture: Secondary | ICD-10-CM | POA: Diagnosis not present

## 2021-04-28 ENCOUNTER — Telehealth: Payer: Self-pay | Admitting: Cardiovascular Disease

## 2021-04-28 NOTE — Telephone Encounter (Signed)
Patients husband came in asking question does wife still need to take amoxicillin before going to the denbtist ? I was unable to find this med on patients med list . Not sure who prescribed it   Please advise

## 2021-05-03 DIAGNOSIS — K5790 Diverticulosis of intestine, part unspecified, without perforation or abscess without bleeding: Secondary | ICD-10-CM | POA: Diagnosis not present

## 2021-05-03 DIAGNOSIS — E785 Hyperlipidemia, unspecified: Secondary | ICD-10-CM | POA: Diagnosis not present

## 2021-05-03 DIAGNOSIS — I2510 Atherosclerotic heart disease of native coronary artery without angina pectoris: Secondary | ICD-10-CM | POA: Diagnosis not present

## 2021-05-03 DIAGNOSIS — I10 Essential (primary) hypertension: Secondary | ICD-10-CM | POA: Diagnosis not present

## 2021-05-03 DIAGNOSIS — M19042 Primary osteoarthritis, left hand: Secondary | ICD-10-CM | POA: Diagnosis not present

## 2021-05-03 DIAGNOSIS — E210 Primary hyperparathyroidism: Secondary | ICD-10-CM | POA: Diagnosis not present

## 2021-05-03 DIAGNOSIS — E669 Obesity, unspecified: Secondary | ICD-10-CM | POA: Diagnosis not present

## 2021-05-03 DIAGNOSIS — M19041 Primary osteoarthritis, right hand: Secondary | ICD-10-CM | POA: Diagnosis not present

## 2021-05-03 DIAGNOSIS — M810 Age-related osteoporosis without current pathological fracture: Secondary | ICD-10-CM | POA: Diagnosis not present

## 2021-05-12 DIAGNOSIS — M19041 Primary osteoarthritis, right hand: Secondary | ICD-10-CM | POA: Diagnosis not present

## 2021-05-12 DIAGNOSIS — K5790 Diverticulosis of intestine, part unspecified, without perforation or abscess without bleeding: Secondary | ICD-10-CM | POA: Diagnosis not present

## 2021-05-12 DIAGNOSIS — M810 Age-related osteoporosis without current pathological fracture: Secondary | ICD-10-CM | POA: Diagnosis not present

## 2021-05-12 DIAGNOSIS — E210 Primary hyperparathyroidism: Secondary | ICD-10-CM | POA: Diagnosis not present

## 2021-05-12 DIAGNOSIS — E669 Obesity, unspecified: Secondary | ICD-10-CM | POA: Diagnosis not present

## 2021-05-12 DIAGNOSIS — E785 Hyperlipidemia, unspecified: Secondary | ICD-10-CM | POA: Diagnosis not present

## 2021-05-12 DIAGNOSIS — M19042 Primary osteoarthritis, left hand: Secondary | ICD-10-CM | POA: Diagnosis not present

## 2021-05-12 DIAGNOSIS — I2510 Atherosclerotic heart disease of native coronary artery without angina pectoris: Secondary | ICD-10-CM | POA: Diagnosis not present

## 2021-05-12 DIAGNOSIS — I10 Essential (primary) hypertension: Secondary | ICD-10-CM | POA: Diagnosis not present

## 2021-05-18 DIAGNOSIS — E210 Primary hyperparathyroidism: Secondary | ICD-10-CM | POA: Diagnosis not present

## 2021-05-18 DIAGNOSIS — I2510 Atherosclerotic heart disease of native coronary artery without angina pectoris: Secondary | ICD-10-CM | POA: Diagnosis not present

## 2021-05-18 DIAGNOSIS — I10 Essential (primary) hypertension: Secondary | ICD-10-CM | POA: Diagnosis not present

## 2021-05-18 DIAGNOSIS — E785 Hyperlipidemia, unspecified: Secondary | ICD-10-CM | POA: Diagnosis not present

## 2021-05-18 DIAGNOSIS — M19041 Primary osteoarthritis, right hand: Secondary | ICD-10-CM | POA: Diagnosis not present

## 2021-05-18 DIAGNOSIS — K5790 Diverticulosis of intestine, part unspecified, without perforation or abscess without bleeding: Secondary | ICD-10-CM | POA: Diagnosis not present

## 2021-05-18 DIAGNOSIS — E669 Obesity, unspecified: Secondary | ICD-10-CM | POA: Diagnosis not present

## 2021-05-18 DIAGNOSIS — M810 Age-related osteoporosis without current pathological fracture: Secondary | ICD-10-CM | POA: Diagnosis not present

## 2021-05-18 DIAGNOSIS — M19042 Primary osteoarthritis, left hand: Secondary | ICD-10-CM | POA: Diagnosis not present

## 2021-06-11 DIAGNOSIS — I10 Essential (primary) hypertension: Secondary | ICD-10-CM | POA: Diagnosis not present

## 2021-06-11 DIAGNOSIS — G44229 Chronic tension-type headache, not intractable: Secondary | ICD-10-CM | POA: Diagnosis not present

## 2021-06-11 DIAGNOSIS — E785 Hyperlipidemia, unspecified: Secondary | ICD-10-CM | POA: Diagnosis not present

## 2021-06-11 DIAGNOSIS — M810 Age-related osteoporosis without current pathological fracture: Secondary | ICD-10-CM | POA: Diagnosis not present

## 2021-06-11 DIAGNOSIS — R32 Unspecified urinary incontinence: Secondary | ICD-10-CM | POA: Diagnosis not present

## 2021-06-11 DIAGNOSIS — R413 Other amnesia: Secondary | ICD-10-CM | POA: Diagnosis not present

## 2021-06-11 DIAGNOSIS — R5381 Other malaise: Secondary | ICD-10-CM | POA: Diagnosis not present

## 2021-06-11 DIAGNOSIS — K582 Mixed irritable bowel syndrome: Secondary | ICD-10-CM | POA: Diagnosis not present

## 2021-06-16 DIAGNOSIS — M19042 Primary osteoarthritis, left hand: Secondary | ICD-10-CM | POA: Diagnosis not present

## 2021-06-16 DIAGNOSIS — K5790 Diverticulosis of intestine, part unspecified, without perforation or abscess without bleeding: Secondary | ICD-10-CM | POA: Diagnosis not present

## 2021-06-16 DIAGNOSIS — M810 Age-related osteoporosis without current pathological fracture: Secondary | ICD-10-CM | POA: Diagnosis not present

## 2021-06-16 DIAGNOSIS — M19041 Primary osteoarthritis, right hand: Secondary | ICD-10-CM | POA: Diagnosis not present

## 2021-06-16 DIAGNOSIS — M25552 Pain in left hip: Secondary | ICD-10-CM | POA: Diagnosis not present

## 2021-06-16 DIAGNOSIS — H9191 Unspecified hearing loss, right ear: Secondary | ICD-10-CM | POA: Diagnosis not present

## 2021-06-16 DIAGNOSIS — I5032 Chronic diastolic (congestive) heart failure: Secondary | ICD-10-CM | POA: Diagnosis not present

## 2021-06-16 DIAGNOSIS — I2510 Atherosclerotic heart disease of native coronary artery without angina pectoris: Secondary | ICD-10-CM | POA: Diagnosis not present

## 2021-06-16 DIAGNOSIS — I110 Hypertensive heart disease with heart failure: Secondary | ICD-10-CM | POA: Diagnosis not present

## 2021-06-21 DIAGNOSIS — I5032 Chronic diastolic (congestive) heart failure: Secondary | ICD-10-CM | POA: Diagnosis not present

## 2021-06-21 DIAGNOSIS — I2510 Atherosclerotic heart disease of native coronary artery without angina pectoris: Secondary | ICD-10-CM | POA: Diagnosis not present

## 2021-06-21 DIAGNOSIS — M810 Age-related osteoporosis without current pathological fracture: Secondary | ICD-10-CM | POA: Diagnosis not present

## 2021-06-21 DIAGNOSIS — H9191 Unspecified hearing loss, right ear: Secondary | ICD-10-CM | POA: Diagnosis not present

## 2021-06-21 DIAGNOSIS — M25552 Pain in left hip: Secondary | ICD-10-CM | POA: Diagnosis not present

## 2021-06-21 DIAGNOSIS — I110 Hypertensive heart disease with heart failure: Secondary | ICD-10-CM | POA: Diagnosis not present

## 2021-06-21 DIAGNOSIS — M19042 Primary osteoarthritis, left hand: Secondary | ICD-10-CM | POA: Diagnosis not present

## 2021-06-21 DIAGNOSIS — K5790 Diverticulosis of intestine, part unspecified, without perforation or abscess without bleeding: Secondary | ICD-10-CM | POA: Diagnosis not present

## 2021-06-21 DIAGNOSIS — M19041 Primary osteoarthritis, right hand: Secondary | ICD-10-CM | POA: Diagnosis not present

## 2021-06-23 ENCOUNTER — Ambulatory Visit (INDEPENDENT_AMBULATORY_CARE_PROVIDER_SITE_OTHER): Payer: Medicare PPO | Admitting: Neurology

## 2021-06-23 ENCOUNTER — Encounter: Payer: Self-pay | Admitting: Neurology

## 2021-06-23 VITALS — BP 160/83 | HR 80 | Ht 61.5 in | Wt 158.5 lb

## 2021-06-23 DIAGNOSIS — R410 Disorientation, unspecified: Secondary | ICD-10-CM | POA: Diagnosis not present

## 2021-06-23 DIAGNOSIS — Z113 Encounter for screening for infections with a predominantly sexual mode of transmission: Secondary | ICD-10-CM | POA: Diagnosis not present

## 2021-06-23 DIAGNOSIS — R413 Other amnesia: Secondary | ICD-10-CM | POA: Diagnosis not present

## 2021-06-23 MED ORDER — LAMOTRIGINE 100 MG PO TABS: 100.0000 mg | ORAL_TABLET | Freq: Two times a day (BID) | ORAL | 11 refills | Status: AC

## 2021-06-23 MED ORDER — LAMOTRIGINE 25 MG PO TABS: ORAL_TABLET | ORAL | 0 refills | Status: DC

## 2021-06-23 NOTE — Progress Notes (Signed)
Chief Complaint  Patient presents with   New Patient (Initial Visit)    Room 12 w/ husband, Ollen Gross and daughter, Ebony Hail. Referred for worsening memory, urinary incontinence and gait abnormality. She uses a cane to assist with ambulation. MoCA: 12/30.      ASSESSMENT AND PLAN  Sarah Cook is a 70 y.o. female   Dementia rapid decline functional status History of fall injury in August 2018, (fell off 5 feet ladder) Differentiation diagnosis including central nervous system degenerative disorder, brain trauma likely contributed to [facility] also report episodes of increased confusion more than the other time, with her reported abnormal MRI of the brain, she is at risk for partial seizure, EEG Empirically treat with lamotrigine, titrating to 100 mg twice a day Laboratory evaluation to rule out treatable etiology  DIAGNOSTIC DATA (LABS, IMAGING, TESTING) - I reviewed patient records, labs, notes, testing and imaging myself where available.  Labs in August 2022, CMP, creat 1.05, CBC Hg 13.7, Triglyceride 202,   MRI of the brain without contrast in December 2018, small right occipital lobe encephalomalacia, most compatible with contusion, predominantly posterior distribution chronic microhemorrhage, mild parenchymal brain volume loss  MEDICAL HISTORY:  Sarah Cook, is a 70 year old female, seen in request by her primary care physician Dr. Addison Lank, Abigail Butts for evaluation of memory loss, rapid decline in functional status, she is accompanied by her husband and daughter Ebony Hail at today's visit June 23, 2021   I reviewed and summarized the referring note. PMHX. HTN Breast cancer in 2000,  right lumpectomy, chemo   She retired from [employer] work in 2018, did not have any difficulty handling daily activity and her job prior to that, but husband reported that when he moved back to [place] in 2005, she was noted to forget the place she grew up with [place], could not remember the  street.  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  Few months after she felt in 2018, she was admitted to the hospital in December 2018 for uncontrolled hypertension, encephalopathy, altered mental status, repeat MRI of the brain in December 2018 showed small area of right occipital lobe encephalomalacia, most consistent with contusion, less likely PCA stroke, predominantly posterior distribution chronic microhemorrhage, associated with traumatic brain injury, mild parenchymal brain volume loss  Over the past 3 years, she continue to decline rapidly, now has increased difficulty handling her daily activity, still able to dress, occasionally needing help, able to do simple house chores, has gait abnormality, rely on her cane, today's MoCA 12/30, she is apparently confused, needing help to provide history,  Family also reported that patient has episode of transient confusion, 1 day her husband came back she was found on the floor, has no recollection of the event, there was no seizure activity  PHYSICAL EXAM:   Vitals:   06/23/21 1348  BP: (!) 160/83  Pulse: 80  Weight: 158 lb 8 oz (71.9 kg)  Height: 5' 1.5" (1.562 m)   Not recorded     Body mass index is 29.46 kg/m.  PHYSICAL EXAMNIATION:  Gen: NAD, conversant, well nourised, well groomed                     Cardiovascular: Regular rate rhythm, no  peripheral edema, warm, nontender. Eyes: Conjunctivae clear without exudates or hemorrhage Neck: Supple, no carotid bruits. Pulmonary: Clear to auscultation bilaterally   NEUROLOGICAL EXAM:  MENTAL STATUS: Speech:    Slow reaction time, cooperative, rely on her family to provide history Cognition:    Montreal Cognitive Assessment  06/23/2021  Visuospatial/ Executive (0/5) 2  Naming (0/3) 3  Attention: Read list of digits (0/2) 1  Attention: Read list of letters (0/1) 1  Attention: Serial 7 subtraction starting at 100 (0/3) 0  Language: Repeat phrase (0/2) 1  Language : Fluency (0/1) 0  Abstraction (0/2) 1  Delayed Recall (0/5) 0  Orientation (0/6) 3  Total 12  Adjusted Score (based on education) 12      CRANIAL NERVES: CN II: Visual fields are full to confrontation. Pupils are round equal and briskly reactive to light. CN III, IV, VI: extraocular movement are normal. No ptosis. CN V: Facial sensation is intact to light touch CN VII: Face is symmetric with normal eye closure  CN VIII: Hearing is normal to causal conversation. CN IX, X: Phonation is normal. CN XI: Head turning and shoulder shrug are intact  MOTOR: There is no pronator drift of out-stretched arms. Muscle bulk and tone are normal. Muscle strength is normal.  REFLEXES: Reflexes are 1 and symmetric at the biceps, triceps, knees, and ankles. Plantar responses are flexor.  SENSORY: Intact to light touch, pinprick and vibratory sensation are intact in fingers and toes.  COORDINATION: There is no trunk or limb dysmetria noted.  GAIT/STANCE: She needs push-up to get up from seated position, unsteady, rely on her cane  REVIEW OF SYSTEMS:  Full 14 system review of systems performed and notable only for as above All other review of systems were negative.   ALLERGIES: Allergies  Allergen Reactions   Anesthetics, Ester Nausea And Vomiting   Codeine Nausea Only   Tape Other (See Comments)    Skin blisters    Amlodipine Other (See Comments)   Hydrochlorothiazide Other (See Comments)   Onion     Does not like   Latex Itching and Rash    Irritates skin    HOME MEDICATIONS: Current Outpatient Medications  Medication Sig Dispense Refill   acetaminophen (TYLENOL) 650 MG CR tablet Take 650 mg by mouth every 8 (eight) hours.     amitriptyline (ELAVIL) 10 MG tablet Take 10 mg by mouth at bedtime.     Cholecalciferol (VITAMIN D3) 2000 units TABS Take 2,000 Units by mouth daily.      CINNAMON PO Take 1 tablet by mouth daily.     Coenzyme Q10 (CO Q 10 PO) Take 1 tablet by mouth daily.     diclofenac sodium (VOLTAREN) 1 % GEL Apply 1 application topically daily as needed.     hydrALAZINE (APRESOLINE) 50 MG tablet Take 1 tablet (50 mg total) by mouth 3 (three) times daily.  1   irbesartan (AVAPRO) 300 MG tablet Take 300 mg by mouth daily.     magnesium oxide (MAG-OX) 400 MG tablet Take 400 mg by mouth daily.     nebivolol (BYSTOLIC) 10 MG tablet  Take 10 mg by mouth daily.     OVER THE COUNTER MEDICATION Take 2 capsules by mouth at bedtime. Tumeric Black pepper '5mg'$  tab take 1 tab by mouth daily     Probiotic Product (PROBIOTIC PO) Take 1 capsule by mouth daily. Takes a power     No current facility-administered medications for this visit.    PAST MEDICAL HISTORY: Past Medical History:  Diagnosis Date   Allergy    Anxiety    Breast cancer ([place])    Cataract    early signs of   Gait abnormality    Heart disease    Hemorrhoids    Hiatal hernia 11/07/2012   large   Hyperlipidemia    Hyperparathyroidism ([place])    Hypertension    Memory loss    Osteoporosis    Parathyroid disorder ([place])    Pulmonary nodules    TIA (transient ischemic attack)    Urinary incontinence     PAST SURGICAL HISTORY: Past Surgical History:  Procedure Laterality Date   BLADDER SUSPENSION     CESAREAN SECTION     x 2   CHOLECYSTECTOMY     MASTECTOMY Right    PARATHYROIDECTOMY     TOTAL ELBOW REPLACEMENT      TUBAL LIGATION      FAMILY HISTORY: Family History  Problem Relation Age of Onset   Breast cancer Mother    Sudden death Father    Heart failure Father    Hypertension Other    Heart disease Other    Colon cancer Neg Hx    Esophageal cancer Neg Hx    Stomach cancer Neg Hx    Rectal cancer Neg Hx     SOCIAL HISTORY: Social History   Socioeconomic History   Marital status: Married    Spouse name: Ollen Gross   Number of children: 3   Years of education: college   Highest education level: Not on file  Occupational History   Occupation: ADMINISRATOR/ STUDENT AFFAIRS    Employer: [employer]     Comment: Retired in 2018  Tobacco Use   Smoking status: Never   Smokeless tobacco: Never  Vaping Use   Vaping Use: Never used  Substance and Sexual Activity   Alcohol use: No   Drug use: No   Sexual activity: Not Currently  Other Topics Concern   Not on file  Social History Narrative   Lives at home with her husband.   Right-handed.   No daily caffeine use.    Social Determinants of Health   Financial Resource Strain: Not on file  Food Insecurity: Not on file  Transportation Needs: Not on file  Physical Activity: Not on file  Stress: Not on file  Social Connections: Not on file  Intimate Partner Violence: Not on file      Marcial Pacas, M.D. Ph.D.  [facility] [address] [place], [place] [address] Ph: [phone] Fax: [phone]  CC:  Cari Caraway, [place],  [place] [address]  Cari Caraway, MD

## 2021-06-24 ENCOUNTER — Telehealth: Payer: Self-pay | Admitting: Neurology

## 2021-06-24 DIAGNOSIS — M19041 Primary osteoarthritis, right hand: Secondary | ICD-10-CM | POA: Diagnosis not present

## 2021-06-24 DIAGNOSIS — M19042 Primary osteoarthritis, left hand: Secondary | ICD-10-CM | POA: Diagnosis not present

## 2021-06-24 DIAGNOSIS — M25552 Pain in left hip: Secondary | ICD-10-CM | POA: Diagnosis not present

## 2021-06-24 DIAGNOSIS — I2510 Atherosclerotic heart disease of native coronary artery without angina pectoris: Secondary | ICD-10-CM | POA: Diagnosis not present

## 2021-06-24 DIAGNOSIS — H9191 Unspecified hearing loss, right ear: Secondary | ICD-10-CM | POA: Diagnosis not present

## 2021-06-24 DIAGNOSIS — K5790 Diverticulosis of intestine, part unspecified, without perforation or abscess without bleeding: Secondary | ICD-10-CM | POA: Diagnosis not present

## 2021-06-24 DIAGNOSIS — I110 Hypertensive heart disease with heart failure: Secondary | ICD-10-CM | POA: Diagnosis not present

## 2021-06-24 DIAGNOSIS — I5032 Chronic diastolic (congestive) heart failure: Secondary | ICD-10-CM | POA: Diagnosis not present

## 2021-06-24 DIAGNOSIS — M810 Age-related osteoporosis without current pathological fracture: Secondary | ICD-10-CM | POA: Diagnosis not present

## 2021-06-24 LAB — THYROID PEROXIDASE ANTIBODY: Thyroperoxidase Ab SerPl-aCnc: <8 IU/mL (ref 0–34)

## 2021-06-24 LAB — THYROID PANEL WITH TSH
Free Thyroxine Index: 2.3 (ref 1.2–4.9)
T3 Uptake Ratio: 29 % (ref 24–39)
T4, Total: 8 ug/dL (ref 4.5–12.0)
TSH: 1.37 u[IU]/mL (ref 0.450–4.500)

## 2021-06-24 LAB — SEDIMENTATION RATE: Sed Rate: 7 mm/hr (ref 0–40)

## 2021-06-24 LAB — VITAMIN B12: Vitamin B-12: 538 pg/mL (ref 232–1245)

## 2021-06-24 LAB — C-REACTIVE PROTEIN: CRP: 3 mg/L (ref 0–10)

## 2021-06-24 LAB — ANA W/REFLEX IF POSITIVE: Anti Nuclear Antibody (ANA): NEGATIVE

## 2021-06-24 LAB — RPR: RPR Ser Ql: NONREACTIVE

## 2021-06-24 NOTE — Telephone Encounter (Signed)
Sarah Cook: [id number] (exp. 06/24/21 to 07/24/21) Sarah Cook: [id number] (exp. 06/24/21 to 07/23/21) order sent to GI. They will reach out to the patient to schedule.

## 2021-06-28 ENCOUNTER — Ambulatory Visit
Admission: RE | Admit: 2021-06-28 | Discharge: 2021-06-28 | Disposition: A | Discharge status: Home / Self Care | Payer: Medicare PPO | Source: Ambulatory Visit | Attending: Neurology | Admitting: Neurology

## 2021-06-28 ENCOUNTER — Other Ambulatory Visit: Payer: Self-pay

## 2021-06-28 ENCOUNTER — Telehealth: Payer: Self-pay | Admitting: Neurology

## 2021-06-28 DIAGNOSIS — R413 Other amnesia: Secondary | ICD-10-CM | POA: Diagnosis not present

## 2021-06-28 DIAGNOSIS — R410 Disorientation, unspecified: Secondary | ICD-10-CM

## 2021-06-28 NOTE — Telephone Encounter (Signed)
EEG order sent to [facility], they will reach out to pt to schedule.

## 2021-06-29 ENCOUNTER — Telehealth: Payer: Self-pay | Admitting: Neurology

## 2021-06-29 DIAGNOSIS — H9191 Unspecified hearing loss, right ear: Secondary | ICD-10-CM | POA: Diagnosis not present

## 2021-06-29 DIAGNOSIS — M25552 Pain in left hip: Secondary | ICD-10-CM | POA: Diagnosis not present

## 2021-06-29 DIAGNOSIS — I5032 Chronic diastolic (congestive) heart failure: Secondary | ICD-10-CM | POA: Diagnosis not present

## 2021-06-29 DIAGNOSIS — W19XXXA Unspecified fall, initial encounter: Secondary | ICD-10-CM | POA: Diagnosis not present

## 2021-06-29 DIAGNOSIS — I2510 Atherosclerotic heart disease of native coronary artery without angina pectoris: Secondary | ICD-10-CM | POA: Diagnosis not present

## 2021-06-29 DIAGNOSIS — S79912A Unspecified injury of left hip, initial encounter: Secondary | ICD-10-CM | POA: Diagnosis not present

## 2021-06-29 DIAGNOSIS — M810 Age-related osteoporosis without current pathological fracture: Secondary | ICD-10-CM | POA: Diagnosis not present

## 2021-06-29 DIAGNOSIS — Z9181 History of falling: Secondary | ICD-10-CM | POA: Diagnosis not present

## 2021-06-29 DIAGNOSIS — K5790 Diverticulosis of intestine, part unspecified, without perforation or abscess without bleeding: Secondary | ICD-10-CM | POA: Diagnosis not present

## 2021-06-29 DIAGNOSIS — R413 Other amnesia: Secondary | ICD-10-CM | POA: Diagnosis not present

## 2021-06-29 DIAGNOSIS — M19041 Primary osteoarthritis, right hand: Secondary | ICD-10-CM | POA: Diagnosis not present

## 2021-06-29 DIAGNOSIS — I110 Hypertensive heart disease with heart failure: Secondary | ICD-10-CM | POA: Diagnosis not present

## 2021-06-29 DIAGNOSIS — M19042 Primary osteoarthritis, left hand: Secondary | ICD-10-CM | POA: Diagnosis not present

## 2021-06-29 NOTE — Telephone Encounter (Signed)
Left messages on home number and husband's mobile number Sarah Cook on [facility]). Requested call back to review results. They may speak to anyone in POD 2.

## 2021-06-29 NOTE — Telephone Encounter (Signed)
Please call patient, MRI of the brain showed mild atrophy, small vessel disease, stable chronic cerebral microhemorrhage at bilateral occipital region, there was no acute abnormalities.     IMPRESSION:    Abnormal MRI brain without contrast demonstrating: -Stable mild atrophy and mild chronic small vessel ischemic disease. -Stable small foci of chronic cerebral microhemorrhages in the bilateral occipital regions. -No acute findings.

## 2021-06-30 NOTE — Telephone Encounter (Signed)
Patient's husband called in and asked to go over his wife's MRI results.  Went over Dr. Rhea Belton review and findings regarding MRI study. He stated he was also going to log in to her MyChart and go over them, if he had any questions he would call back.  Patient denied further questions, verbalized understanding and expressed appreciation for the phone call.

## 2021-07-01 DIAGNOSIS — M19041 Primary osteoarthritis, right hand: Secondary | ICD-10-CM | POA: Diagnosis not present

## 2021-07-01 DIAGNOSIS — M19042 Primary osteoarthritis, left hand: Secondary | ICD-10-CM | POA: Diagnosis not present

## 2021-07-01 DIAGNOSIS — H9191 Unspecified hearing loss, right ear: Secondary | ICD-10-CM | POA: Diagnosis not present

## 2021-07-01 DIAGNOSIS — I5032 Chronic diastolic (congestive) heart failure: Secondary | ICD-10-CM | POA: Diagnosis not present

## 2021-07-01 DIAGNOSIS — M25552 Pain in left hip: Secondary | ICD-10-CM | POA: Diagnosis not present

## 2021-07-01 DIAGNOSIS — K5790 Diverticulosis of intestine, part unspecified, without perforation or abscess without bleeding: Secondary | ICD-10-CM | POA: Diagnosis not present

## 2021-07-01 DIAGNOSIS — M810 Age-related osteoporosis without current pathological fracture: Secondary | ICD-10-CM | POA: Diagnosis not present

## 2021-07-01 DIAGNOSIS — I2510 Atherosclerotic heart disease of native coronary artery without angina pectoris: Secondary | ICD-10-CM | POA: Diagnosis not present

## 2021-07-01 DIAGNOSIS — I110 Hypertensive heart disease with heart failure: Secondary | ICD-10-CM | POA: Diagnosis not present

## 2021-07-05 DIAGNOSIS — M19042 Primary osteoarthritis, left hand: Secondary | ICD-10-CM | POA: Diagnosis not present

## 2021-07-05 DIAGNOSIS — I110 Hypertensive heart disease with heart failure: Secondary | ICD-10-CM | POA: Diagnosis not present

## 2021-07-05 DIAGNOSIS — M25552 Pain in left hip: Secondary | ICD-10-CM | POA: Diagnosis not present

## 2021-07-05 DIAGNOSIS — M19041 Primary osteoarthritis, right hand: Secondary | ICD-10-CM | POA: Diagnosis not present

## 2021-07-05 DIAGNOSIS — K5790 Diverticulosis of intestine, part unspecified, without perforation or abscess without bleeding: Secondary | ICD-10-CM | POA: Diagnosis not present

## 2021-07-05 DIAGNOSIS — I2510 Atherosclerotic heart disease of native coronary artery without angina pectoris: Secondary | ICD-10-CM | POA: Diagnosis not present

## 2021-07-05 DIAGNOSIS — M810 Age-related osteoporosis without current pathological fracture: Secondary | ICD-10-CM | POA: Diagnosis not present

## 2021-07-05 DIAGNOSIS — I5032 Chronic diastolic (congestive) heart failure: Secondary | ICD-10-CM | POA: Diagnosis not present

## 2021-07-05 DIAGNOSIS — H9191 Unspecified hearing loss, right ear: Secondary | ICD-10-CM | POA: Diagnosis not present

## 2021-07-07 DIAGNOSIS — K5790 Diverticulosis of intestine, part unspecified, without perforation or abscess without bleeding: Secondary | ICD-10-CM | POA: Diagnosis not present

## 2021-07-07 DIAGNOSIS — M19041 Primary osteoarthritis, right hand: Secondary | ICD-10-CM | POA: Diagnosis not present

## 2021-07-07 DIAGNOSIS — I5032 Chronic diastolic (congestive) heart failure: Secondary | ICD-10-CM | POA: Diagnosis not present

## 2021-07-07 DIAGNOSIS — M19042 Primary osteoarthritis, left hand: Secondary | ICD-10-CM | POA: Diagnosis not present

## 2021-07-07 DIAGNOSIS — I110 Hypertensive heart disease with heart failure: Secondary | ICD-10-CM | POA: Diagnosis not present

## 2021-07-07 DIAGNOSIS — M25552 Pain in left hip: Secondary | ICD-10-CM | POA: Diagnosis not present

## 2021-07-07 DIAGNOSIS — H9191 Unspecified hearing loss, right ear: Secondary | ICD-10-CM | POA: Diagnosis not present

## 2021-07-07 DIAGNOSIS — I2510 Atherosclerotic heart disease of native coronary artery without angina pectoris: Secondary | ICD-10-CM | POA: Diagnosis not present

## 2021-07-07 DIAGNOSIS — M810 Age-related osteoporosis without current pathological fracture: Secondary | ICD-10-CM | POA: Diagnosis not present

## 2021-07-14 DIAGNOSIS — M25552 Pain in left hip: Secondary | ICD-10-CM | POA: Diagnosis not present

## 2021-07-14 DIAGNOSIS — K5790 Diverticulosis of intestine, part unspecified, without perforation or abscess without bleeding: Secondary | ICD-10-CM | POA: Diagnosis not present

## 2021-07-14 DIAGNOSIS — I2510 Atherosclerotic heart disease of native coronary artery without angina pectoris: Secondary | ICD-10-CM | POA: Diagnosis not present

## 2021-07-14 DIAGNOSIS — H9191 Unspecified hearing loss, right ear: Secondary | ICD-10-CM | POA: Diagnosis not present

## 2021-07-14 DIAGNOSIS — M810 Age-related osteoporosis without current pathological fracture: Secondary | ICD-10-CM | POA: Diagnosis not present

## 2021-07-14 DIAGNOSIS — I110 Hypertensive heart disease with heart failure: Secondary | ICD-10-CM | POA: Diagnosis not present

## 2021-07-14 DIAGNOSIS — I5032 Chronic diastolic (congestive) heart failure: Secondary | ICD-10-CM | POA: Diagnosis not present

## 2021-07-14 DIAGNOSIS — M19041 Primary osteoarthritis, right hand: Secondary | ICD-10-CM | POA: Diagnosis not present

## 2021-07-14 DIAGNOSIS — M19042 Primary osteoarthritis, left hand: Secondary | ICD-10-CM | POA: Diagnosis not present

## 2021-07-19 ENCOUNTER — Inpatient Hospital Stay (HOSPITAL_BASED_OUTPATIENT_CLINIC_OR_DEPARTMENT_OTHER)
Admission: EM | Admit: 2021-07-19 | Discharge: 2021-07-27 | DRG: 884 | Disposition: A | Discharge status: Skilled Nursing Facility | Payer: Medicare PPO | Attending: Internal Medicine | Admitting: Internal Medicine

## 2021-07-19 ENCOUNTER — Other Ambulatory Visit: Payer: Self-pay

## 2021-07-19 ENCOUNTER — Encounter (HOSPITAL_BASED_OUTPATIENT_CLINIC_OR_DEPARTMENT_OTHER): Payer: Self-pay | Admitting: Emergency Medicine

## 2021-07-19 DIAGNOSIS — G9389 Other specified disorders of brain: Secondary | ICD-10-CM

## 2021-07-19 DIAGNOSIS — Z8249 Family history of ischemic heart disease and other diseases of the circulatory system: Secondary | ICD-10-CM | POA: Diagnosis not present

## 2021-07-19 DIAGNOSIS — Z91018 Allergy to other foods: Secondary | ICD-10-CM

## 2021-07-19 DIAGNOSIS — F0390 Unspecified dementia without behavioral disturbance: Principal | ICD-10-CM

## 2021-07-19 DIAGNOSIS — Z91040 Latex allergy status: Secondary | ICD-10-CM

## 2021-07-19 DIAGNOSIS — R296 Repeated falls: Secondary | ICD-10-CM | POA: Diagnosis present

## 2021-07-19 DIAGNOSIS — Z853 Personal history of malignant neoplasm of breast: Secondary | ICD-10-CM | POA: Diagnosis not present

## 2021-07-19 DIAGNOSIS — I739 Peripheral vascular disease, unspecified: Secondary | ICD-10-CM | POA: Diagnosis present

## 2021-07-19 DIAGNOSIS — R32 Unspecified urinary incontinence: Secondary | ICD-10-CM | POA: Diagnosis present

## 2021-07-19 DIAGNOSIS — Z79899 Other long term (current) drug therapy: Secondary | ICD-10-CM | POA: Diagnosis not present

## 2021-07-19 DIAGNOSIS — Z9011 Acquired absence of right breast and nipple: Secondary | ICD-10-CM | POA: Diagnosis not present

## 2021-07-19 DIAGNOSIS — E892 Postprocedural hypoparathyroidism: Secondary | ICD-10-CM | POA: Diagnosis present

## 2021-07-19 DIAGNOSIS — I160 Hypertensive urgency: Secondary | ICD-10-CM | POA: Diagnosis present

## 2021-07-19 DIAGNOSIS — R21 Rash and other nonspecific skin eruption: Secondary | ICD-10-CM | POA: Diagnosis not present

## 2021-07-19 DIAGNOSIS — Z884 Allergy status to anesthetic agent status: Secondary | ICD-10-CM

## 2021-07-19 DIAGNOSIS — E785 Hyperlipidemia, unspecified: Secondary | ICD-10-CM | POA: Diagnosis present

## 2021-07-19 DIAGNOSIS — K449 Diaphragmatic hernia without obstruction or gangrene: Secondary | ICD-10-CM | POA: Diagnosis not present

## 2021-07-19 DIAGNOSIS — R404 Transient alteration of awareness: Secondary | ICD-10-CM | POA: Diagnosis not present

## 2021-07-19 DIAGNOSIS — R0902 Hypoxemia: Secondary | ICD-10-CM | POA: Diagnosis not present

## 2021-07-19 DIAGNOSIS — F419 Anxiety disorder, unspecified: Secondary | ICD-10-CM | POA: Diagnosis present

## 2021-07-19 DIAGNOSIS — Z20822 Contact with and (suspected) exposure to covid-19: Secondary | ICD-10-CM | POA: Diagnosis present

## 2021-07-19 DIAGNOSIS — B029 Zoster without complications: Secondary | ICD-10-CM | POA: Diagnosis present

## 2021-07-19 DIAGNOSIS — W19XXXA Unspecified fall, initial encounter: Secondary | ICD-10-CM | POA: Diagnosis not present

## 2021-07-19 DIAGNOSIS — Z803 Family history of malignant neoplasm of breast: Secondary | ICD-10-CM | POA: Diagnosis not present

## 2021-07-19 DIAGNOSIS — M810 Age-related osteoporosis without current pathological fracture: Secondary | ICD-10-CM | POA: Diagnosis present

## 2021-07-19 DIAGNOSIS — M6281 Muscle weakness (generalized): Secondary | ICD-10-CM | POA: Diagnosis present

## 2021-07-19 DIAGNOSIS — R4182 Altered mental status, unspecified: Secondary | ICD-10-CM | POA: Diagnosis not present

## 2021-07-19 DIAGNOSIS — Z9049 Acquired absence of other specified parts of digestive tract: Secondary | ICD-10-CM | POA: Diagnosis not present

## 2021-07-19 DIAGNOSIS — Z8673 Personal history of transient ischemic attack (TIA), and cerebral infarction without residual deficits: Secondary | ICD-10-CM

## 2021-07-19 DIAGNOSIS — R488 Other symbolic dysfunctions: Secondary | ICD-10-CM | POA: Diagnosis not present

## 2021-07-19 DIAGNOSIS — R079 Chest pain, unspecified: Secondary | ICD-10-CM | POA: Diagnosis not present

## 2021-07-19 DIAGNOSIS — R5381 Other malaise: Secondary | ICD-10-CM | POA: Diagnosis present

## 2021-07-19 DIAGNOSIS — E7849 Other hyperlipidemia: Secondary | ICD-10-CM | POA: Diagnosis not present

## 2021-07-19 DIAGNOSIS — I10 Essential (primary) hypertension: Secondary | ICD-10-CM | POA: Diagnosis present

## 2021-07-19 DIAGNOSIS — R531 Weakness: Secondary | ICD-10-CM | POA: Diagnosis present

## 2021-07-19 DIAGNOSIS — Z885 Allergy status to narcotic agent status: Secondary | ICD-10-CM

## 2021-07-19 DIAGNOSIS — Z7401 Bed confinement status: Secondary | ICD-10-CM | POA: Diagnosis not present

## 2021-07-19 DIAGNOSIS — E212 Other hyperparathyroidism: Secondary | ICD-10-CM | POA: Diagnosis not present

## 2021-07-19 DIAGNOSIS — E876 Hypokalemia: Secondary | ICD-10-CM | POA: Diagnosis present

## 2021-07-19 DIAGNOSIS — Z888 Allergy status to other drugs, medicaments and biological substances status: Secondary | ICD-10-CM

## 2021-07-19 DIAGNOSIS — F411 Generalized anxiety disorder: Secondary | ICD-10-CM | POA: Diagnosis not present

## 2021-07-19 DIAGNOSIS — S0180XA Unspecified open wound of other part of head, initial encounter: Secondary | ICD-10-CM | POA: Diagnosis not present

## 2021-07-19 LAB — URINALYSIS, ROUTINE W REFLEX MICROSCOPIC
Bilirubin Urine: NEGATIVE
Glucose, UA: NEGATIVE mg/dL
Hgb urine dipstick: NEGATIVE
Ketones, ur: NEGATIVE mg/dL
Leukocytes,Ua: NEGATIVE
Nitrite: NEGATIVE
Specific Gravity, Urine: 1.025 (ref 1.005–1.030)
pH: 6.5 (ref 5.0–8.0)

## 2021-07-19 LAB — CBC WITH DIFFERENTIAL/PLATELET
Abs Immature Granulocytes: 0.05 10*3/uL (ref 0.00–0.07)
Basophils Absolute: 0 10*3/uL (ref 0.0–0.1)
Basophils Relative: 0 %
Eosinophils Absolute: 0.1 10*3/uL (ref 0.0–0.5)
Eosinophils Relative: 1 %
HCT: 35.6 % — ABNORMAL LOW (ref 36.0–46.0)
Hemoglobin: 12.7 g/dL (ref 12.0–15.0)
Immature Granulocytes: 1 %
Lymphocytes Relative: 14 %
Lymphs Abs: 1.5 10*3/uL (ref 0.7–4.0)
MCH: 32.1 pg (ref 26.0–34.0)
MCHC: 35.7 g/dL (ref 30.0–36.0)
MCV: 89.9 fL (ref 80.0–100.0)
Monocytes Absolute: 0.8 10*3/uL (ref 0.1–1.0)
Monocytes Relative: 7 %
Neutro Abs: 8.6 10*3/uL — ABNORMAL HIGH (ref 1.7–7.7)
Neutrophils Relative %: 77 %
Platelets: 207 10*3/uL (ref 150–400)
RBC: 3.96 MIL/uL (ref 3.87–5.11)
RDW: 12.8 % (ref 11.5–15.5)
WBC: 11.1 10*3/uL — ABNORMAL HIGH (ref 4.0–10.5)
nRBC: 0 % (ref 0.0–0.2)

## 2021-07-19 LAB — BASIC METABOLIC PANEL
Anion gap: 10 (ref 5–15)
BUN: 21 mg/dL (ref 8–23)
CO2: 30 mmol/L (ref 22–32)
Calcium: 11 mg/dL — ABNORMAL HIGH (ref 8.9–10.3)
Chloride: 103 mmol/L (ref 98–111)
Creatinine, Ser: 0.93 mg/dL (ref 0.44–1.00)
GFR, Estimated: >60 mL/min (ref >60)
Glucose, Bld: 117 mg/dL — ABNORMAL HIGH (ref 70–99)
Potassium: 2.6 mmol/L — CL (ref 3.5–5.1)
Sodium: 143 mmol/L (ref 135–145)

## 2021-07-19 MED ORDER — POTASSIUM CHLORIDE CRYS ER 20 MEQ PO TBCR
40.0000 meq | EXTENDED_RELEASE_TABLET | Freq: Once | ORAL | Status: AC
  Administered 2021-07-19: 40 meq via ORAL
  Filled 2021-07-19: qty 2

## 2021-07-19 MED ORDER — POTASSIUM CHLORIDE 10 MEQ/100ML IV SOLN
10.0000 meq | Freq: Once | INTRAVENOUS | Status: AC
  Administered 2021-07-19: 10 meq via INTRAVENOUS
  Filled 2021-07-19: qty 100

## 2021-07-19 NOTE — ED Triage Notes (Addendum)
Pt presents to ED BIB [facility]. Pt had assisted fall to ground by husband. Pt has no complaints but has had multiple falls recently and increased weakness so PCP suggested coming to ED to r/o UTI. Pt disoriented x3, oriented to self at baseline. EMS VSS.

## 2021-07-19 NOTE — ED Notes (Addendum)
Pure wick placed.

## 2021-07-19 NOTE — ED Provider Notes (Signed)
[facility] Provider Note  CSN: [id number] Arrival date & time: 07/19/21 1925    History Chief Complaint  Patient presents with  . Fall    Sarah Cook is a 70 y.o. female with history of prior memory loss brought to the ED via EMS from home after she almost fell. Husband caught her and lowered her to the ground. She did not injure herself. She is not able to give much history but husband reports she has had increased weakness the last few days, to the point of not being able to transfer from wheelchair to recliner as usual. She has had poor PO intake and worsening memory problems. No reported fever N/V/D or dysuria but maybe some increased frequency. She has physical therapy that comes to the house and they noted her to have increased weakness today. She also has aids that come to help when the husband needs to go out to run errands, etc but otherwise her husband is her primary caregiver 24/7. Level 5 caveat applies  Review of chart shows that she was seen by Neurology 8/17 for memory loss, incontinence and gait abnormality, was able to walk with a cane. Please see that note for details.   Past Medical History:  Diagnosis Date  . Allergy   . Anxiety   . Breast cancer ([place])   . Cataract    early signs of  . Gait abnormality   . Heart disease   . Hemorrhoids   . Hiatal hernia 11/07/2012   large  . Hyperlipidemia   . Hyperparathyroidism ([place])   . Hypertension   . Memory loss   . Osteoporosis   . Parathyroid disorder ([place])   . Pulmonary nodules   . TIA (transient ischemic attack)   . Urinary incontinence     Past Surgical History:  Procedure Laterality Date  . BLADDER SUSPENSION    . CESAREAN SECTION     x 2  . CHOLECYSTECTOMY    . MASTECTOMY Right   . PARATHYROIDECTOMY    . TOTAL ELBOW REPLACEMENT    . TUBAL LIGATION      Family History  Problem Relation Age of Onset  . Breast cancer Mother   . Sudden death Father   . Heart  failure Father   . Hypertension Other   . Heart disease Other   . Colon cancer Neg Hx   . Esophageal cancer Neg Hx   . Stomach cancer Neg Hx   . Rectal cancer Neg Hx     Social History   Tobacco Use  . Smoking status: Never  . Smokeless tobacco: Never  Vaping Use  . Vaping Use: Never used  Substance Use Topics  . Alcohol use: No  . Drug use: No     Home Medications Prior to Admission medications   Medication Sig Start Date End Date Taking? Authorizing Provider  acetaminophen (TYLENOL) 650 MG CR tablet Take 650 mg by mouth every 8 (eight) hours.    [provider]  amitriptyline (ELAVIL) 10 MG tablet Take 10 mg by mouth at bedtime.    [provider]  Cholecalciferol (VITAMIN D3) 2000 units TABS Take 2,000 Units by mouth daily.     [provider]  CINNAMON PO Take 1 tablet by mouth daily.    [provider]  Coenzyme Q10 (CO Q 10 PO) Take 1 tablet by mouth daily.    [provider]  diclofenac sodium (VOLTAREN) 1 % GEL Apply 1 application topically  daily as needed. 06/16/17   [provider]  hydrALAZINE (APRESOLINE) 50 MG tablet Take 1 tablet (50 mg total) by mouth 3 (three) times daily. 10/18/17   Domenic Polite, MD  irbesartan (AVAPRO) 300 MG tablet Take 300 mg by mouth daily. 06/12/17   [provider]  lamoTRIgine (LAMICTAL) 100 MG tablet Take 1 tablet (100 mg total) by mouth 2 (two) times daily. 06/23/21   Marcial Pacas, MD  lamoTRIgine (LAMICTAL) 25 MG tablet 1 tab bid x one week 2 tab bid x 2nd week 3 tab bid x 3rd week 06/23/21   Marcial Pacas, MD  magnesium oxide (MAG-OX) 400 MG tablet Take 400 mg by mouth daily.    [provider]  nebivolol (BYSTOLIC) 10 MG tablet Take 10 mg by mouth daily.    [provider]  OVER THE COUNTER MEDICATION Take 2 capsules by mouth at bedtime. Tumeric Black pepper '5mg'$  tab take 1 tab by mouth daily    [provider]  Probiotic Product (PROBIOTIC PO) Take 1  capsule by mouth daily. Takes a Secretary/administrator, Historical, MD     Allergies    Anesthetics, ester; Codeine; Tape; Amlodipine; Hydrochlorothiazide; Onion; and Latex   Review of Systems   Review of Systems Unable to assess due to mental status.    Physical Exam BP (!) 169/76 (BP Location: Right Arm)   Pulse 80   Temp 97.6 F (36.4 C) (Oral)   Resp 15   Ht '5\' 1"'$  (1.549 m)   Wt 77.1 kg   SpO2 95%   BMI 32.12 kg/m   Physical Exam Vitals and nursing note reviewed.  Constitutional:      Appearance: Normal appearance.  HENT:     Head: Normocephalic and atraumatic.     Nose: Nose normal.     Mouth/Throat:     Mouth: Mucous membranes are moist.  Eyes:     Extraocular Movements: Extraocular movements intact.     Conjunctiva/sclera: Conjunctivae normal.  Cardiovascular:     Rate and Rhythm: Normal rate.  Pulmonary:     Effort: Pulmonary effort is normal.     Breath sounds: Normal breath sounds.  Abdominal:     General: Abdomen is flat.     Palpations: Abdomen is soft.     Tenderness: There is no abdominal tenderness.  Musculoskeletal:        General: No swelling. Normal range of motion.     Cervical back: Neck supple.     Right lower leg: Edema (trace) present.     Left lower leg: Edema (trace) present.  Skin:    General: Skin is warm and dry.     Comments: Rash on L forehead consistent with shingles, denies pain or drainage from eye, no hutchinson's sign  Neurological:     General: No focal deficit present.     Mental Status: She is alert. She is disoriented.     Cranial Nerves: No cranial nerve deficit.     Sensory: No sensory deficit.     Motor: Weakness (general) present.  Psychiatric:        Mood and Affect: Mood normal.      ED Results / Procedures / Treatments   Labs (all labs ordered are listed, but only abnormal results are displayed) Labs Reviewed  URINALYSIS, ROUTINE W REFLEX MICROSCOPIC  BASIC METABOLIC PANEL  CBC WITH DIFFERENTIAL/PLATELET     EKG EKG Interpretation  Date/Time:  Monday July 19 2021 21:16:35 EDT Ventricular Rate:  81 PR Interval:  143 QRS Duration: 85 QT Interval:  408 QTC Calculation: 474 R Axis:   44 Text Interpretation: Sinus rhythm Consider left ventricular hypertrophy No significant change since last tracing Confirmed by Calvert Cantor [phone]) on 07/19/2021 9:56:20 PM   Radiology No results found.  Procedures Procedures  Medications Ordered in the ED Medications - No data to display   MDM Rules/Calculators/A&P MDM   ED Course  I have reviewed the triage vital signs and the nursing notes.  Pertinent labs & imaging results that were available during my care of the patient were reviewed by me and considered in my medical decision making (see chart for details).  Clinical Course as of 07/19/21 2316  Mon Jul 19, 2021  2229 CBC with mild leukocytosis.  [CS]  2253 BMP shows marked hypokalemia, will begin repletion. Anticipate will need admission pending UA.  [CS]  [place] of the patient signed out to Dr. Betsey Holiday at the change of shift.  [CS]    Clinical Course User Index [CS] Truddie Hidden, MD    Final Clinical Impression(s) / ED Diagnoses Final diagnoses:  None    Rx / DC Orders ED Discharge Orders     None        Truddie Hidden, MD 07/19/21 2316

## 2021-07-20 ENCOUNTER — Emergency Department (HOSPITAL_BASED_OUTPATIENT_CLINIC_OR_DEPARTMENT_OTHER): Payer: Medicare PPO

## 2021-07-20 DIAGNOSIS — R32 Unspecified urinary incontinence: Secondary | ICD-10-CM | POA: Diagnosis not present

## 2021-07-20 DIAGNOSIS — F0390 Unspecified dementia without behavioral disturbance: Secondary | ICD-10-CM | POA: Diagnosis not present

## 2021-07-20 DIAGNOSIS — Z9011 Acquired absence of right breast and nipple: Secondary | ICD-10-CM | POA: Diagnosis not present

## 2021-07-20 DIAGNOSIS — R531 Weakness: Secondary | ICD-10-CM | POA: Diagnosis present

## 2021-07-20 DIAGNOSIS — I739 Peripheral vascular disease, unspecified: Secondary | ICD-10-CM | POA: Diagnosis not present

## 2021-07-20 DIAGNOSIS — M6281 Muscle weakness (generalized): Secondary | ICD-10-CM | POA: Diagnosis not present

## 2021-07-20 DIAGNOSIS — R296 Repeated falls: Secondary | ICD-10-CM | POA: Diagnosis not present

## 2021-07-20 DIAGNOSIS — Z8673 Personal history of transient ischemic attack (TIA), and cerebral infarction without residual deficits: Secondary | ICD-10-CM | POA: Diagnosis not present

## 2021-07-20 DIAGNOSIS — Z9049 Acquired absence of other specified parts of digestive tract: Secondary | ICD-10-CM | POA: Diagnosis not present

## 2021-07-20 DIAGNOSIS — E876 Hypokalemia: Secondary | ICD-10-CM | POA: Diagnosis not present

## 2021-07-20 DIAGNOSIS — E785 Hyperlipidemia, unspecified: Secondary | ICD-10-CM | POA: Diagnosis not present

## 2021-07-20 DIAGNOSIS — R5381 Other malaise: Secondary | ICD-10-CM | POA: Diagnosis not present

## 2021-07-20 DIAGNOSIS — F419 Anxiety disorder, unspecified: Secondary | ICD-10-CM | POA: Diagnosis not present

## 2021-07-20 DIAGNOSIS — R079 Chest pain, unspecified: Secondary | ICD-10-CM | POA: Diagnosis not present

## 2021-07-20 DIAGNOSIS — G9389 Other specified disorders of brain: Secondary | ICD-10-CM

## 2021-07-20 DIAGNOSIS — Z803 Family history of malignant neoplasm of breast: Secondary | ICD-10-CM | POA: Diagnosis not present

## 2021-07-20 DIAGNOSIS — Z8249 Family history of ischemic heart disease and other diseases of the circulatory system: Secondary | ICD-10-CM | POA: Diagnosis not present

## 2021-07-20 DIAGNOSIS — I160 Hypertensive urgency: Secondary | ICD-10-CM | POA: Diagnosis not present

## 2021-07-20 DIAGNOSIS — Z79899 Other long term (current) drug therapy: Secondary | ICD-10-CM | POA: Diagnosis not present

## 2021-07-20 DIAGNOSIS — I10 Essential (primary) hypertension: Secondary | ICD-10-CM | POA: Diagnosis not present

## 2021-07-20 DIAGNOSIS — Z853 Personal history of malignant neoplasm of breast: Secondary | ICD-10-CM | POA: Diagnosis not present

## 2021-07-20 DIAGNOSIS — Z20822 Contact with and (suspected) exposure to covid-19: Secondary | ICD-10-CM | POA: Diagnosis not present

## 2021-07-20 DIAGNOSIS — E892 Postprocedural hypoparathyroidism: Secondary | ICD-10-CM | POA: Diagnosis not present

## 2021-07-20 DIAGNOSIS — B029 Zoster without complications: Secondary | ICD-10-CM | POA: Diagnosis not present

## 2021-07-20 DIAGNOSIS — M810 Age-related osteoporosis without current pathological fracture: Secondary | ICD-10-CM | POA: Diagnosis not present

## 2021-07-20 DIAGNOSIS — K449 Diaphragmatic hernia without obstruction or gangrene: Secondary | ICD-10-CM | POA: Diagnosis not present

## 2021-07-20 LAB — TROPONIN I (HIGH SENSITIVITY)
Troponin I (High Sensitivity): 15 ng/L (ref <18)
Troponin I (High Sensitivity): 16 ng/L (ref <18)

## 2021-07-20 LAB — CBC WITH DIFFERENTIAL/PLATELET
Abs Immature Granulocytes: 0.02 10*3/uL (ref 0.00–0.07)
Basophils Absolute: 0.1 10*3/uL (ref 0.0–0.1)
Basophils Relative: 1 %
Eosinophils Absolute: 0.1 10*3/uL (ref 0.0–0.5)
Eosinophils Relative: 2 %
HCT: 34.2 % — ABNORMAL LOW (ref 36.0–46.0)
Hemoglobin: 12.4 g/dL (ref 12.0–15.0)
Immature Granulocytes: 0 %
Lymphocytes Relative: 21 %
Lymphs Abs: 1.4 10*3/uL (ref 0.7–4.0)
MCH: 32.5 pg (ref 26.0–34.0)
MCHC: 36.3 g/dL — ABNORMAL HIGH (ref 30.0–36.0)
MCV: 89.8 fL (ref 80.0–100.0)
Monocytes Absolute: 0.5 10*3/uL (ref 0.1–1.0)
Monocytes Relative: 8 %
Neutro Abs: 4.5 10*3/uL (ref 1.7–7.7)
Neutrophils Relative %: 68 %
Platelets: 218 10*3/uL (ref 150–400)
RBC: 3.81 MIL/uL — ABNORMAL LOW (ref 3.87–5.11)
RDW: 12.9 % (ref 11.5–15.5)
WBC: 6.5 10*3/uL (ref 4.0–10.5)
nRBC: 0 % (ref 0.0–0.2)

## 2021-07-20 LAB — TSH: TSH: 1.439 u[IU]/mL (ref 0.350–4.500)

## 2021-07-20 LAB — COMPREHENSIVE METABOLIC PANEL
ALT: 16 U/L (ref 0–44)
AST: 26 U/L (ref 15–41)
Albumin: 4.3 g/dL (ref 3.5–5.0)
Alkaline Phosphatase: 53 U/L (ref 38–126)
Anion gap: 11 (ref 5–15)
BUN: 14 mg/dL (ref 8–23)
CO2: 27 mmol/L (ref 22–32)
Calcium: 9.9 mg/dL (ref 8.9–10.3)
Chloride: 106 mmol/L (ref 98–111)
Creatinine, Ser: 0.87 mg/dL (ref 0.44–1.00)
GFR, Estimated: >60 mL/min (ref >60)
Glucose, Bld: 108 mg/dL — ABNORMAL HIGH (ref 70–99)
Potassium: 2.9 mmol/L — ABNORMAL LOW (ref 3.5–5.1)
Sodium: 144 mmol/L (ref 135–145)
Total Bilirubin: 1.8 mg/dL — ABNORMAL HIGH (ref 0.3–1.2)
Total Protein: 6.8 g/dL (ref 6.5–8.1)

## 2021-07-20 LAB — HIV ANTIBODY (ROUTINE TESTING W REFLEX): HIV Screen 4th Generation wRfx: NONREACTIVE

## 2021-07-20 LAB — MAGNESIUM
Magnesium: 2.1 mg/dL (ref 1.7–2.4)
Magnesium: 2.1 mg/dL (ref 1.7–2.4)

## 2021-07-20 LAB — RESP PANEL BY RT-PCR (FLU A&B, COVID) ARPGX2
Influenza A by PCR: NEGATIVE
Influenza B by PCR: NEGATIVE
SARS Coronavirus 2 by RT PCR: NEGATIVE

## 2021-07-20 LAB — PHOSPHORUS: Phosphorus: 1.9 mg/dL — ABNORMAL LOW (ref 2.5–4.6)

## 2021-07-20 MED ORDER — HYDRALAZINE HCL 25 MG PO TABS
25.0000 mg | ORAL_TABLET | ORAL | Status: DC | PRN
  Administered 2021-07-20: 25 mg via ORAL
  Filled 2021-07-20: qty 1

## 2021-07-20 MED ORDER — SODIUM CHLORIDE 0.9 % IV SOLN: INTRAVENOUS | Status: DC

## 2021-07-20 MED ORDER — TRAZODONE HCL 50 MG PO TABS
25.0000 mg | ORAL_TABLET | Freq: Every day | ORAL | Status: AC
  Administered 2021-07-20 – 2021-07-21 (×2): 25 mg via ORAL
  Filled 2021-07-20 (×2): qty 1

## 2021-07-20 MED ORDER — ALUM & MAG HYDROXIDE-SIMETH 200-200-20 MG/5ML PO SUSP
30.0000 mL | Freq: Once | ORAL | Status: AC
  Administered 2021-07-20: 30 mL via ORAL
  Filled 2021-07-20: qty 30

## 2021-07-20 MED ORDER — POTASSIUM PHOSPHATES 15 MMOLE/5ML IV SOLN
45.0000 mmol | Freq: Once | INTRAVENOUS | Status: AC
  Administered 2021-07-20: 45 mmol via INTRAVENOUS
  Filled 2021-07-20: qty 15

## 2021-07-20 MED ORDER — SODIUM CHLORIDE 0.9% FLUSH
3.0000 mL | Freq: Two times a day (BID) | INTRAVENOUS | Status: DC
  Administered 2021-07-20 – 2021-07-26 (×10): 3 mL via INTRAVENOUS

## 2021-07-20 MED ORDER — ENOXAPARIN SODIUM 40 MG/0.4ML IJ SOSY
40.0000 mg | PREFILLED_SYRINGE | Freq: Every day | INTRAMUSCULAR | Status: DC
  Administered 2021-07-20 – 2021-07-27 (×8): 40 mg via SUBCUTANEOUS
  Filled 2021-07-20 (×8): qty 0.4

## 2021-07-20 MED ORDER — LIDOCAINE VISCOUS HCL 2 % MT SOLN
15.0000 mL | Freq: Once | OROMUCOSAL | Status: AC
  Administered 2021-07-20: 15 mL via ORAL
  Filled 2021-07-20: qty 15

## 2021-07-20 MED ORDER — LABETALOL HCL 5 MG/ML IV SOLN
10.0000 mg | INTRAVENOUS | Status: DC | PRN
  Administered 2021-07-20 – 2021-07-26 (×7): 10 mg via INTRAVENOUS
  Filled 2021-07-20 (×7): qty 4

## 2021-07-20 MED ORDER — ACETAMINOPHEN 650 MG RE SUPP: 650.0000 mg | Freq: Four times a day (QID) | RECTAL | Status: DC | PRN

## 2021-07-20 MED ORDER — ACETAMINOPHEN 325 MG PO TABS
650.0000 mg | ORAL_TABLET | Freq: Four times a day (QID) | ORAL | Status: DC | PRN
  Administered 2021-07-24 (×2): 650 mg via ORAL
  Filled 2021-07-20 (×2): qty 2

## 2021-07-20 NOTE — H&P (Signed)
**Note DeSarahIdentified via Obfuscation** History and Physical    ROSHONDA Cook  [id number]  DOB: 1951/02/18  DOA: 07/19/2021  PCP: Cari Caraway, MD Patient coming from: home  Chief Complaint: weakness, falling  HPI:  [name] is a 70 yo female with PMH dementia, hypertension, anxiety, urinary incontinence, rapid functional decline.  She presented to the ER with weakness, multiple falls at home, and ongoing poor mentation. She was also recently evaluated by neurology on 06/23/2021.  She had similar symptoms at that time.  She was placed empirically on lamotrigine however there was no witnessed seizure activity at home but she is noted to be at risk for partial seizure. Etiology of her symptoms is suspected to be due to her underlying dementia with cerebral atrophy/encephalomalacia.  I have personally briefly reviewed patient's old medical records in [facility] and discussed patient with the ER provider when appropriate/indicated.  Assessment/Plan: * Generalized muscle weakness - Suspect this is progressive and due to her underlying dementia - Has had multiple falls at home - PT/OT consulted  Encephalomalacia - History of worsening mentation and disorientation.  Has undergone recent MRI brain on 06/28/2021 showing underlying atrophy and chronic small vessel disease.  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  Per neurology, at risk for partial seizure - Has been started on lamotrigine outpatient  Hypokalemia - Likely due to poor oral intake at home.  No overt report of nausea/vomiting or diarrhea - Continue repleting as necessary - Continue following magnesium as well  Hypophosphatemia - Replete and recheck as needed  Urinary incontinence - Also chronic issue at home - Continue adult briefs; pure wick okay too     Code Status:     Code Status: Full Code  DVT Prophylaxis:   enoxaparin (LOVENOX) injection 40 mg Start: 07/20/21 1300   Anticipated disposition is to:  pending PT/OT evals  History: Past Medical History:  Diagnosis Date   Allergy    Anxiety    Breast cancer ([place])    Cataract    early signs of   Gait abnormality    Heart disease    Hemorrhoids    Hiatal hernia 11/07/2012   large   Hyperlipidemia    Hyperparathyroidism ([place])    Hypertension    Memory loss    Osteoporosis    Parathyroid disorder ([place])    Pulmonary nodules    TIA (transient ischemic attack)    Urinary incontinence     Past Surgical History:  Procedure Laterality Date   BLADDER SUSPENSION     CESAREAN SECTION     x 2   CHOLECYSTECTOMY     MASTECTOMY Right    PARATHYROIDECTOMY     TOTAL ELBOW REPLACEMENT     TUBAL LIGATION       reports that she has never smoked. She has never used smokeless tobacco. She reports that she does not drink alcohol and does not use drugs.  Allergies  Allergen Reactions   Anesthetics, Ester Nausea And Vomiting   Codeine Nausea Only   Tape Other (See Comments)    Skin blisters   Amlodipine Other (See Comments)   Hydrochlorothiazide Other (See Comments)   Onion     Does not like   Latex Itching and Rash    Irritates skin    Family History  Problem Relation Age of Onset   Breast cancer Mother    Sudden death Father    Heart failure Father    Hypertension Other    Heart disease  Other    Colon cancer Neg Hx    Esophageal cancer Neg Hx    Stomach cancer Neg Hx    Rectal cancer Neg Hx    Home Medications: Prior to Admission medications   Medication Sig Start Date End Date Taking? Authorizing Provider  acetaminophen (TYLENOL) 650 MG CR tablet Take 1,300 mg by mouth in the morning and at bedtime.   Yes [provider]  amLODipine (NORVASC) 2.5 MG tablet Take 2.5 mg by mouth daily. 07/09/21  Yes [provider]  Ascorbic Acid (VITAMIN C PO) Take 1 tablet by mouth daily.   Yes [provider]  Cholecalciferol (VITAMIN D3) 2000 units TABS Take 2,000 Units by mouth daily.    Yes [provider]  CINNAMON PO Take 1 tablet by mouth daily.   Yes [provider]  Coenzyme Q10 (CO Q 10 PO) Take 1 tablet by mouth daily.   Yes [provider]  docusate sodium (COLACE) 100 MG capsule Take 100 mg by mouth daily.   Yes [provider]  hydrALAZINE (APRESOLINE) 25 MG tablet Take 25 mg by mouth 2 (two) times daily. 06/11/21  Yes [provider]  irbesartan (AVAPRO) 300 MG tablet Take 300 mg by mouth daily. 06/12/17  Yes [provider]  lamoTRIgine (LAMICTAL) 100 MG tablet Take 1 tablet (100 mg total) by mouth 2 (two) times daily. 06/23/21  Yes Marcial Pacas, MD  magnesium oxide (MAGSarahOX) 400 MG tablet Take 400 mg by mouth daily.   Yes [provider]  Nebivolol HCl 20 MG TABS Take 20 mg by mouth daily as needed. 06/22/21  Yes [provider]  OVER THE COUNTER MEDICATION Take 2 capsules by mouth at bedtime. Tumeric Black pepper '5mg'$  tab take 1 tab by mouth daily   Yes [provider]  Probiotic Product (PROBIOTIC PO) Take 1 capsule by mouth daily. Takes a power   Yes [provider]  rosuvastatin (CRESTOR) 10 MG tablet Take 10 mg by mouth daily. 06/18/21  Yes [provider]  lamoTRIgine (LAMICTAL) 25 MG tablet 1 tab bid x one week 2 tab bid x 2nd week 3 tab bid x 3rd week Patient not taking: No sig reported 06/23/21   Marcial Pacas, MD    Review of Systems:  Review of systems not obtained due to patient factors. Cognitive impairment   Physical Exam: Vitals:   07/20/21 0936 07/20/21 0945 07/20/21 1133 07/20/21 1504  BP: (!) 170/78  (!) 159/71 (!) 191/78  Pulse:  70 71 73  Resp: '19 20 16 17  '$ Temp:   98.4 F (36.9 C) 98.4 F (36.9 C)  TempSrc:   Oral Oral  SpO2: 96% 96% 96% 96%  Weight:      Height:       General appearance:  Slowed mentation, awake and alert.  She is oriented to name, year, president but unable to elaborate on openSarahended questions Head: Normocephalic, without obvious abnormality,  atraumatic Eyes:  EOMI Lungs: clear to auscultation bilaterally Heart: regular rate and rhythm and S1, S2 normal Abdomen: normal findings: bowel sounds normal and soft, nonSarahtender Extremities:  1+ LE edema Skin: mobility and turgor normal Neurologic: Follows commands, moves all 4 extremities.  No focal weakness  Labs on Admission:  I have personally reviewed following labs and imaging studies Results for orders placed or performed during the hospital encounter of 07/19/21 (from the past 24 hour(s))  Basic metabolic panel     Status: Abnormal  Collection Time: 07/19/21 10:19 PM  Result Value Ref Range   Sodium 143 135 - 145 mmol/L   Potassium 2.6 (LL) 3.5 - 5.1 mmol/L   Chloride 103 98 - 111 mmol/L   CO2 30 22 - 32 mmol/L   Glucose, Bld 117 (H) 70 - 99 mg/dL   BUN 21 8 - 23 mg/dL   Creatinine, Ser 0.93 0.44 - 1.00 mg/dL   Calcium 11.0 (H) 8.9 - 10.3 mg/dL   GFR, Estimated >60 >60 mL/min   Anion gap 10 5 - 15  CBC with Differential     Status: Abnormal   Collection Time: 07/19/21 10:19 PM  Result Value Ref Range   WBC 11.1 (H) 4.0 - 10.5 K/uL   RBC 3.96 3.87 - 5.11 MIL/uL   Hemoglobin 12.7 12.0 - 15.0 g/dL   HCT 35.6 (L) 36.0 - 46.0 %   MCV 89.9 80.0 - 100.0 fL   MCH 32.1 26.0 - 34.0 pg   MCHC 35.7 30.0 - 36.0 g/dL   RDW 12.8 11.5 - 15.5 %   Platelets 207 150 - 400 K/uL   nRBC 0.0 0.0 - 0.2 %   Neutrophils Relative % 77 %   Neutro Abs 8.6 (H) 1.7 - 7.7 K/uL   Lymphocytes Relative 14 %   Lymphs Abs 1.5 0.7 - 4.0 K/uL   Monocytes Relative 7 %   Monocytes Absolute 0.8 0.1 - 1.0 K/uL   Eosinophils Relative 1 %   Eosinophils Absolute 0.1 0.0 - 0.5 K/uL   Basophils Relative 0 %   Basophils Absolute 0.0 0.0 - 0.1 K/uL   Immature Granulocytes 1 %   Abs Immature Granulocytes 0.05 0.00 - 0.07 K/uL  Urinalysis, Routine w reflex microscopic Urine, Clean Catch     Status: Abnormal   Collection Time: 07/19/21 11:16 PM  Result Value Ref Range   Color, Urine YELLOW YELLOW    APPearance HAZY (A) CLEAR   Specific Gravity, Urine 1.025 1.005 - 1.030   pH 6.5 5.0 - 8.0   Glucose, UA NEGATIVE NEGATIVE mg/dL   Hgb urine dipstick NEGATIVE NEGATIVE   Bilirubin Urine NEGATIVE NEGATIVE   Ketones, ur NEGATIVE NEGATIVE mg/dL   Protein, ur TRACE (A) NEGATIVE mg/dL   Nitrite NEGATIVE NEGATIVE   Leukocytes,Ua NEGATIVE NEGATIVE   RBC / HPF 0Sarah5 0 - 5 RBC/hpf   WBC, UA 0Sarah5 0 - 5 WBC/hpf   Squamous Epithelial / LPF 0Sarah5 0 - 5   Hyaline Casts, UA PRESENT    Amorphous Crystal PRESENT    Triple Phosphate Crystal PRESENT   Troponin I (High Sensitivity)     Status: None   Collection Time: 07/20/21 12:04 AM  Result Value Ref Range   Troponin I (High Sensitivity) 15 <18 ng/L  Resp Panel by RTSarahPCR (Flu A&B, Covid) Nasopharyngeal Swab     Status: None   Collection Time: 07/20/21 12:07 AM   Specimen: Nasopharyngeal Swab; Nasopharyngeal(NP) swabs in vial transport medium  Result Value Ref Range   SARS Coronavirus 2 by RT PCR NEGATIVE NEGATIVE   Influenza A by PCR NEGATIVE NEGATIVE   Influenza B by PCR NEGATIVE NEGATIVE  Troponin I (High Sensitivity)     Status: None   Collection Time: 07/20/21  3:04 AM  Result Value Ref Range   Troponin I (High Sensitivity) 16 <18 ng/L  Magnesium     Status: None   Collection Time: 07/20/21  3:04 AM  Result Value Ref Range   Magnesium 2.1 1.7 - 2.4 mg/dL  Comprehensive metabolic panel     Status: Abnormal   Collection Time: 07/20/21 12:49 PM  Result Value Ref Range   Sodium 144 135 - 145 mmol/L   Potassium 2.9 (L) 3.5 - 5.1 mmol/L   Chloride 106 98 - 111 mmol/L   CO2 27 22 - 32 mmol/L   Glucose, Bld 108 (H) 70 - 99 mg/dL   BUN 14 8 - 23 mg/dL   Creatinine, Ser 0.87 0.44 - 1.00 mg/dL   Calcium 9.9 8.9 - 10.3 mg/dL   Total Protein 6.8 6.5 - 8.1 g/dL   Albumin 4.3 3.5 - 5.0 g/dL   AST 26 15 - 41 U/L   ALT 16 0 - 44 U/L   Alkaline Phosphatase 53 38 - 126 U/L   Total Bilirubin 1.8 (H) 0.3 - 1.2 mg/dL   GFR, Estimated >60 >60 mL/min    Anion gap 11 5 - 15  CBC WITH DIFFERENTIAL     Status: Abnormal   Collection Time: 07/20/21 12:49 PM  Result Value Ref Range   WBC 6.5 4.0 - 10.5 K/uL   RBC 3.81 (L) 3.87 - 5.11 MIL/uL   Hemoglobin 12.4 12.0 - 15.0 g/dL   HCT 34.2 (L) 36.0 - 46.0 %   MCV 89.8 80.0 - 100.0 fL   MCH 32.5 26.0 - 34.0 pg   MCHC 36.3 (H) 30.0 - 36.0 g/dL   RDW 12.9 11.5 - 15.5 %   Platelets 218 150 - 400 K/uL   nRBC 0.0 0.0 - 0.2 %   Neutrophils Relative % 68 %   Neutro Abs 4.5 1.7 - 7.7 K/uL   Lymphocytes Relative 21 %   Lymphs Abs 1.4 0.7 - 4.0 K/uL   Monocytes Relative 8 %   Monocytes Absolute 0.5 0.1 - 1.0 K/uL   Eosinophils Relative 2 %   Eosinophils Absolute 0.1 0.0 - 0.5 K/uL   Basophils Relative 1 %   Basophils Absolute 0.1 0.0 - 0.1 K/uL   Immature Granulocytes 0 %   Abs Immature Granulocytes 0.02 0.00 - 0.07 K/uL  Magnesium     Status: None   Collection Time: 07/20/21 12:49 PM  Result Value Ref Range   Magnesium 2.1 1.7 - 2.4 mg/dL  Phosphorus     Status: Abnormal   Collection Time: 07/20/21 12:49 PM  Result Value Ref Range   Phosphorus 1.9 (L) 2.5 - 4.6 mg/dL  TSH     Status: None   Collection Time: 07/20/21 12:49 PM  Result Value Ref Range   TSH 1.439 0.350 - 4.500 uIU/mL     Radiological Exams on Admission: DG Chest Port 1 View  Result Date: 07/20/2021 CLINICAL DATA:  Chest pain. EXAM: PORTABLE CHEST 1 VIEW COMPARISON:  January 27, 2016 FINDINGS: Mild, diffuse, chronic appearing increased lung markings are seen. There is no evidence of acute infiltrate, pleural effusion or pneumothorax. The heart size and mediastinal contours are within normal limits. A large hiatal hernia is seen. Radiopaque surgical clips are noted along the lateral aspect of the right hemithorax. The visualized skeletal structures are unremarkable. IMPRESSION: 1. No evidence of acute cardiopulmonary disease. 2. Large hiatal hernia. Electronically Signed   By: Virgina Norfolk M.D.   On: 07/20/2021 01:12   DG  Chest [facility]  Final Result      Consults called:     EKG: Independently reviewed. NSR, QTc 474   Dwyane Dee, MD [facility] 07/20/2021, 3:14 PM

## 2021-07-20 NOTE — ED Notes (Signed)
Report called to [facility] °

## 2021-07-20 NOTE — Assessment & Plan Note (Addendum)
-   Suspect this is progressive and due to her underlying dementia - Has had multiple falls at home - PT/OT consulted: SNF recommended  - stable for d/c once facility found and insurance approved

## 2021-07-20 NOTE — Progress Notes (Signed)
Notified MD of increased blood pressures

## 2021-07-20 NOTE — ED Notes (Signed)
MD notified of systolic BP. MD stated that Pt had not received regular BP meds which would be given once the Pt got to the floor.

## 2021-07-20 NOTE — Assessment & Plan Note (Signed)
-  Replete and recheck as needed 

## 2021-07-20 NOTE — ED Notes (Signed)
XRAY at Bedside.

## 2021-07-20 NOTE — Assessment & Plan Note (Signed)
-   Also chronic issue at home - Continue adult briefs; pure wick okay too

## 2021-07-20 NOTE — ED Notes (Signed)
Patient currently resting in Stretcher Comfortably. Family Visitor comfortable at this Time. Both have been updated regarding Admission Status. Call [place] within Reach.

## 2021-07-20 NOTE — Plan of Care (Signed)

## 2021-07-20 NOTE — ED Notes (Signed)
Patient Bed Sheets changed as some were soiled. Sheets changed. External Urianry Catheter exchanged. Brief changed. Patient repositioned and provided with Warm Blankets. VS assessed and PIV redressed due to bruising noted around Site. PIV flushed well with no signs of infiltration. Staff will continue to monitor. Call [place] within reach. Family at Bedside.

## 2021-07-20 NOTE — Hospital Course (Signed)
Sarah Cook is a 70 yo female with PMH dementia, hypertension, anxiety, urinary incontinence, rapid functional decline.  She presented to the ER with weakness, multiple falls at home, and ongoing poor mentation. She was also recently evaluated by neurology on 06/23/2021.  She had similar symptoms at that time.  She was placed empirically on lamotrigine however there was no witnessed seizure activity at home but she is noted to be at risk for partial seizure. Etiology of her symptoms is suspected to be due to her underlying dementia with cerebral atrophy/encephalomalacia.

## 2021-07-20 NOTE — Assessment & Plan Note (Signed)
-   Likely due to poor oral intake at home.  No overt report of nausea/vomiting or diarrhea - Continue repleting as necessary - Continue following magnesium as well

## 2021-07-20 NOTE — Assessment & Plan Note (Signed)
-   History of worsening mentation and disorientation.  Has undergone recent MRI brain on 06/28/2021 showing underlying atrophy and chronic small vessel disease.  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  Per neurology, at risk for partial seizure - Has been started on lamotrigine outpatient

## 2021-07-21 DIAGNOSIS — G9389 Other specified disorders of brain: Secondary | ICD-10-CM | POA: Diagnosis present

## 2021-07-21 DIAGNOSIS — Z853 Personal history of malignant neoplasm of breast: Secondary | ICD-10-CM | POA: Diagnosis not present

## 2021-07-21 DIAGNOSIS — M6281 Muscle weakness (generalized): Secondary | ICD-10-CM | POA: Diagnosis present

## 2021-07-21 DIAGNOSIS — Z8673 Personal history of transient ischemic attack (TIA), and cerebral infarction without residual deficits: Secondary | ICD-10-CM | POA: Diagnosis not present

## 2021-07-21 DIAGNOSIS — Z79899 Other long term (current) drug therapy: Secondary | ICD-10-CM | POA: Diagnosis not present

## 2021-07-21 DIAGNOSIS — I739 Peripheral vascular disease, unspecified: Secondary | ICD-10-CM | POA: Diagnosis present

## 2021-07-21 DIAGNOSIS — I10 Essential (primary) hypertension: Secondary | ICD-10-CM | POA: Diagnosis present

## 2021-07-21 DIAGNOSIS — Z20822 Contact with and (suspected) exposure to covid-19: Secondary | ICD-10-CM | POA: Diagnosis present

## 2021-07-21 DIAGNOSIS — M810 Age-related osteoporosis without current pathological fracture: Secondary | ICD-10-CM | POA: Diagnosis present

## 2021-07-21 DIAGNOSIS — R5381 Other malaise: Secondary | ICD-10-CM | POA: Diagnosis present

## 2021-07-21 DIAGNOSIS — Z803 Family history of malignant neoplasm of breast: Secondary | ICD-10-CM | POA: Diagnosis not present

## 2021-07-21 DIAGNOSIS — F0390 Unspecified dementia without behavioral disturbance: Secondary | ICD-10-CM | POA: Diagnosis present

## 2021-07-21 DIAGNOSIS — R296 Repeated falls: Secondary | ICD-10-CM | POA: Diagnosis present

## 2021-07-21 DIAGNOSIS — I160 Hypertensive urgency: Secondary | ICD-10-CM | POA: Diagnosis present

## 2021-07-21 DIAGNOSIS — R531 Weakness: Secondary | ICD-10-CM | POA: Diagnosis present

## 2021-07-21 DIAGNOSIS — R32 Unspecified urinary incontinence: Secondary | ICD-10-CM | POA: Diagnosis present

## 2021-07-21 DIAGNOSIS — Z8249 Family history of ischemic heart disease and other diseases of the circulatory system: Secondary | ICD-10-CM | POA: Diagnosis not present

## 2021-07-21 DIAGNOSIS — F419 Anxiety disorder, unspecified: Secondary | ICD-10-CM | POA: Diagnosis present

## 2021-07-21 DIAGNOSIS — E785 Hyperlipidemia, unspecified: Secondary | ICD-10-CM | POA: Diagnosis present

## 2021-07-21 DIAGNOSIS — Z9011 Acquired absence of right breast and nipple: Secondary | ICD-10-CM | POA: Diagnosis not present

## 2021-07-21 DIAGNOSIS — E892 Postprocedural hypoparathyroidism: Secondary | ICD-10-CM | POA: Diagnosis present

## 2021-07-21 DIAGNOSIS — Z9049 Acquired absence of other specified parts of digestive tract: Secondary | ICD-10-CM | POA: Diagnosis not present

## 2021-07-21 DIAGNOSIS — B029 Zoster without complications: Secondary | ICD-10-CM | POA: Diagnosis present

## 2021-07-21 DIAGNOSIS — E876 Hypokalemia: Secondary | ICD-10-CM | POA: Diagnosis present

## 2021-07-21 LAB — BASIC METABOLIC PANEL
Anion gap: 10 (ref 5–15)
BUN: 7 mg/dL — ABNORMAL LOW (ref 8–23)
CO2: 26 mmol/L (ref 22–32)
Calcium: 9.7 mg/dL (ref 8.9–10.3)
Chloride: 108 mmol/L (ref 98–111)
Creatinine, Ser: 0.72 mg/dL (ref 0.44–1.00)
GFR, Estimated: >60 mL/min (ref >60)
Glucose, Bld: 111 mg/dL — ABNORMAL HIGH (ref 70–99)
Potassium: 2.6 mmol/L — CL (ref 3.5–5.1)
Sodium: 144 mmol/L (ref 135–145)

## 2021-07-21 LAB — CBC WITH DIFFERENTIAL/PLATELET
Abs Immature Granulocytes: 0.05 10*3/uL (ref 0.00–0.07)
Basophils Absolute: 0 10*3/uL (ref 0.0–0.1)
Basophils Relative: 1 %
Eosinophils Absolute: 0.1 10*3/uL (ref 0.0–0.5)
Eosinophils Relative: 1 %
HCT: 34.8 % — ABNORMAL LOW (ref 36.0–46.0)
Hemoglobin: 12.3 g/dL (ref 12.0–15.0)
Immature Granulocytes: 1 %
Lymphocytes Relative: 20 %
Lymphs Abs: 1.6 10*3/uL (ref 0.7–4.0)
MCH: 32 pg (ref 26.0–34.0)
MCHC: 35.3 g/dL (ref 30.0–36.0)
MCV: 90.6 fL (ref 80.0–100.0)
Monocytes Absolute: 0.6 10*3/uL (ref 0.1–1.0)
Monocytes Relative: 8 %
Neutro Abs: 5.3 10*3/uL (ref 1.7–7.7)
Neutrophils Relative %: 69 %
Platelets: 218 10*3/uL (ref 150–400)
RBC: 3.84 MIL/uL — ABNORMAL LOW (ref 3.87–5.11)
RDW: 12.5 % (ref 11.5–15.5)
WBC: 7.6 10*3/uL (ref 4.0–10.5)
nRBC: 0 % (ref 0.0–0.2)

## 2021-07-21 LAB — MAGNESIUM: Magnesium: 1.7 mg/dL (ref 1.7–2.4)

## 2021-07-21 MED ORDER — AMLODIPINE BESYLATE 5 MG PO TABS
2.5000 mg | ORAL_TABLET | Freq: Every day | ORAL | Status: DC
  Administered 2021-07-21: 2.5 mg via ORAL
  Filled 2021-07-21: qty 1

## 2021-07-21 MED ORDER — ROSUVASTATIN CALCIUM 10 MG PO TABS
10.0000 mg | ORAL_TABLET | Freq: Every day | ORAL | Status: DC
  Administered 2021-07-21 – 2021-07-27 (×7): 10 mg via ORAL
  Filled 2021-07-21 (×7): qty 1

## 2021-07-21 MED ORDER — POTASSIUM CHLORIDE 10 MEQ/100ML IV SOLN
10.0000 meq | INTRAVENOUS | Status: AC
Start: 2021-07-21 — End: 2021-07-21
  Administered 2021-07-21 (×6): 10 meq via INTRAVENOUS
  Filled 2021-07-21 (×5): qty 100

## 2021-07-21 MED ORDER — MAGNESIUM OXIDE -MG SUPPLEMENT 400 (240 MG) MG PO TABS
400.0000 mg | ORAL_TABLET | Freq: Every day | ORAL | Status: DC
  Administered 2021-07-21 – 2021-07-27 (×7): 400 mg via ORAL
  Filled 2021-07-21 (×7): qty 1

## 2021-07-21 MED ORDER — LAMOTRIGINE 100 MG PO TABS
100.0000 mg | ORAL_TABLET | Freq: Two times a day (BID) | ORAL | Status: DC
  Administered 2021-07-21 – 2021-07-27 (×13): 100 mg via ORAL
  Filled 2021-07-21 (×13): qty 1

## 2021-07-21 MED ORDER — MAGNESIUM SULFATE 2 GM/50ML IV SOLN
2.0000 g | Freq: Once | INTRAVENOUS | Status: AC
  Administered 2021-07-21: 2 g via INTRAVENOUS
  Filled 2021-07-21: qty 50

## 2021-07-21 MED ORDER — HYDRALAZINE HCL 25 MG PO TABS
25.0000 mg | ORAL_TABLET | Freq: Two times a day (BID) | ORAL | Status: DC
  Administered 2021-07-21 – 2021-07-24 (×8): 25 mg via ORAL
  Filled 2021-07-21 (×8): qty 1

## 2021-07-21 MED ORDER — IRBESARTAN 300 MG PO TABS
300.0000 mg | ORAL_TABLET | Freq: Every day | ORAL | Status: DC
  Administered 2021-07-21 – 2021-07-27 (×7): 300 mg via ORAL
  Filled 2021-07-21 (×7): qty 1

## 2021-07-21 NOTE — Progress Notes (Signed)
MD paged about pt being fidgetty, trying to get out of bed, and not following commands. Also notified of BP 183/81.

## 2021-07-21 NOTE — Progress Notes (Signed)
MD paged. Pts BP 179/119. Meds given per [facility], will recheck.

## 2021-07-21 NOTE — Progress Notes (Signed)
MD paged. Pt's BP 157/124.

## 2021-07-21 NOTE — Assessment & Plan Note (Signed)
-   BP escalating after admission; requiring IV labetalol for help with control - resuming amlodipine, irbesartan, hydralazine, and nebivolol - continue PRN labetalol and hydralazine

## 2021-07-21 NOTE — Plan of Care (Signed)

## 2021-07-21 NOTE — Progress Notes (Signed)
Lab called stating pt's potassium 2.6. MD notified.

## 2021-07-21 NOTE — Progress Notes (Signed)
Manual BP recheck done. BP 192/64. MD paged.

## 2021-07-21 NOTE — Progress Notes (Signed)
MD paged for 198/60 manual BP.

## 2021-07-21 NOTE — Progress Notes (Signed)
Progress Note    Sarah Cook   [id number]  DOB: 1951-08-09  DOA: 07/19/2021     0  PCP: Cari Caraway, MD  Initial CC: weakness, falls at home  Hospital Course: Sarah Cook is a 70 yo female with PMH dementia, hypertension, anxiety, urinary incontinence, rapid functional decline.  She presented to the ER with weakness, multiple falls at home, and ongoing poor mentation. She was also recently evaluated by neurology on 06/23/2021.  She had similar symptoms at that time.  She was placed empirically on lamotrigine however there was no witnessed seizure activity at home but she is noted to be at risk for partial seizure. Etiology of her symptoms is suspected to be due to her underlying dementia with cerebral atrophy/encephalomalacia.  Interval History:  No events overnight.  Husband present bedside this morning. We discussed her overall progressive decline over the past several months.  He understands that she may be approaching the need for requiring long term care. For now he wishes to see if she would benefit from short term rehab.   ROS: Review of systems not obtained due to patient factors. Cognitive impairment   Assessment & Plan: * Generalized muscle weakness - Suspect this is progressive and due to her underlying dementia - Has had multiple falls at home - PT/OT consulted  Encephalomalacia - History of worsening mentation and disorientation.  Has undergone recent MRI brain on 06/28/2021 showing underlying atrophy and chronic small vessel disease.  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  Per neurology, at risk for partial seizure - Has been started on lamotrigine outpatient  Hypokalemia - Likely due to poor oral intake at home.  No overt report of nausea/vomiting or diarrhea - Continue repleting as necessary - Continue following magnesium as well  Hypertensive urgency - BP escalating after admission; requiring IV labetalol for  help with control - resuming amlodipine, irbesartan, hydralazine, and nebivolol - continue PRN labetalol and hydralazine   Hypophosphatemia - Replete and recheck as needed  Urinary incontinence - Also chronic issue at home - Continue adult briefs; pure wick okay too    Old records reviewed in assessment of this patient  Antimicrobials:   DVT prophylaxis: enoxaparin (LOVENOX) injection 40 mg Start: 07/20/21 1300   Code Status:   Code Status: Full Code Family Communication: Husband  Disposition Plan: Status is: Inpatient  Remains inpatient appropriate because:IV treatments appropriate due to intensity of illness or inability to take PO and Inpatient level of care appropriate due to severity of illness  Dispo: The patient is from: Home              Anticipated d/c is to:  pending PT eval               Patient currently is not medically stable to d/c.   Difficult to place patient No  Risk of unplanned readmission score: Unplanned Admission- Pilot do not use: 14.71   Objective: Blood pressure (!) 157/124, pulse 82, temperature 98.3 F (36.8 C), temperature source Oral, resp. rate 18, height '5\' 1"'$  (1.549 m), weight 77.1 kg, SpO2 100 %.  Examination: General appearance:  Slowed mentation, awake and alert.  She is oriented to name, year, president but unable to elaborate on open-ended questions Head: Normocephalic, without obvious abnormality, atraumatic Eyes:  EOMI Lungs: clear to auscultation bilaterally Heart: regular rate and rhythm and S1, S2 normal Abdomen: normal findings: bowel sounds normal and soft, non-tender Extremities:  1+ LE edema Skin: mobility  and turgor normal Neurologic: Follows commands, moves all 4 extremities.  No focal weakness  Consultants:    Procedures:    Data Reviewed: I have personally reviewed following labs and imaging studies Results for orders placed or performed during the hospital encounter of 07/19/21 (from the past 24 hour(s))   Basic metabolic panel     Status: Abnormal   Collection Time: 07/21/21  3:51 AM  Result Value Ref Range   Sodium 144 135 - 145 mmol/L   Potassium 2.6 (LL) 3.5 - 5.1 mmol/L   Chloride 108 98 - 111 mmol/L   CO2 26 22 - 32 mmol/L   Glucose, Bld 111 (H) 70 - 99 mg/dL   BUN 7 (L) 8 - 23 mg/dL   Creatinine, Ser 0.72 0.44 - 1.00 mg/dL   Calcium 9.7 8.9 - 10.3 mg/dL   GFR, Estimated >60 >60 mL/min   Anion gap 10 5 - 15  CBC with Differential/Platelet     Status: Abnormal   Collection Time: 07/21/21  3:51 AM  Result Value Ref Range   WBC 7.6 4.0 - 10.5 K/uL   RBC 3.84 (L) 3.87 - 5.11 MIL/uL   Hemoglobin 12.3 12.0 - 15.0 g/dL   HCT 34.8 (L) 36.0 - 46.0 %   MCV 90.6 80.0 - 100.0 fL   MCH 32.0 26.0 - 34.0 pg   MCHC 35.3 30.0 - 36.0 g/dL   RDW 12.5 11.5 - 15.5 %   Platelets 218 150 - 400 K/uL   nRBC 0.0 0.0 - 0.2 %   Neutrophils Relative % 69 %   Neutro Abs 5.3 1.7 - 7.7 K/uL   Lymphocytes Relative 20 %   Lymphs Abs 1.6 0.7 - 4.0 K/uL   Monocytes Relative 8 %   Monocytes Absolute 0.6 0.1 - 1.0 K/uL   Eosinophils Relative 1 %   Eosinophils Absolute 0.1 0.0 - 0.5 K/uL   Basophils Relative 1 %   Basophils Absolute 0.0 0.0 - 0.1 K/uL   Immature Granulocytes 1 %   Abs Immature Granulocytes 0.05 0.00 - 0.07 K/uL  Magnesium     Status: None   Collection Time: 07/21/21  3:51 AM  Result Value Ref Range   Magnesium 1.7 1.7 - 2.4 mg/dL    Recent Results (from the past 240 hour(s))  Resp Panel by RT-PCR (Flu A&B, Covid) Nasopharyngeal Swab     Status: None   Collection Time: 07/20/21 12:07 AM   Specimen: Nasopharyngeal Swab; Nasopharyngeal(NP) swabs in vial transport medium  Result Value Ref Range Status   SARS Coronavirus 2 by RT PCR NEGATIVE NEGATIVE Final    Comment: (NOTE) SARS-CoV-2 target nucleic acids are NOT DETECTED.  The SARS-CoV-2 RNA is generally detectable in upper respiratory specimens during the acute phase of infection. The lowest concentration of SARS-CoV-2 viral  copies this assay can detect is 138 copies/mL. A negative result does not preclude SARS-Cov-2 infection and should not be used as the sole basis for treatment or other patient management decisions. A negative result may occur with  improper specimen collection/handling, submission of specimen other than nasopharyngeal swab, presence of viral mutation(s) within the areas targeted by this assay, and inadequate number of viral copies(<138 copies/mL). A negative result must be combined with clinical observations, patient history, and epidemiological information. The expected result is Negative.  Fact Sheet for Patients:  EntrepreneurPulse.com.au  Fact Sheet for Healthcare Providers:  IncredibleEmployment.be  This test is no t yet approved or cleared by the [place] and  has been authorized for detection and/or diagnosis of SARS-CoV-2 by FDA under an Emergency Use Authorization (EUA). This EUA will remain  in effect (meaning this test can be used) for the duration of the COVID-19 declaration under Section 564(b)(1) of the Act, 21 U.S.C.section 360bbb-3(b)(1), unless the authorization is terminated  or revoked sooner.       Influenza A by PCR NEGATIVE NEGATIVE Final   Influenza B by PCR NEGATIVE NEGATIVE Final    Comment: (NOTE) The Xpert Xpress SARS-CoV-2/FLU/RSV plus assay is intended as an aid in the diagnosis of influenza from Nasopharyngeal swab specimens and should not be used as a sole basis for treatment. Nasal washings and aspirates are unacceptable for Xpert Xpress SARS-CoV-2/FLU/RSV testing.  Fact Sheet for Patients: EntrepreneurPulse.com.au  Fact Sheet for Healthcare Providers: IncredibleEmployment.be  This test is not yet approved or cleared by the [place] FDA and has been authorized for detection and/or diagnosis of SARS-CoV-2 by FDA under an Emergency Use Authorization (EUA). This  EUA will remain in effect (meaning this test can be used) for the duration of the COVID-19 declaration under Section 564(b)(1) of the Act, 21 U.S.C. section 360bbb-3(b)(1), unless the authorization is terminated or revoked.  Performed at [facility], [address], [place], [place] [address]      Radiology Studies: [facility] Chest [facility] 1 View  Result Date: 07/20/2021 CLINICAL DATA:  Chest pain. EXAM: PORTABLE CHEST 1 VIEW COMPARISON:  January 27, 2016 FINDINGS: Mild, diffuse, chronic appearing increased lung markings are seen. There is no evidence of acute infiltrate, pleural effusion or pneumothorax. The heart size and mediastinal contours are within normal limits. A large hiatal hernia is seen. Radiopaque surgical clips are noted along the lateral aspect of the right hemithorax. The visualized skeletal structures are unremarkable. IMPRESSION: 1. No evidence of acute cardiopulmonary disease. 2. Large hiatal hernia. Electronically Signed   By: Virgina Norfolk M.D.   On: 07/20/2021 01:12   DG Chest Port 1 View  Final Result      Scheduled Meds:  amLODipine  2.5 mg Oral Daily   enoxaparin (LOVENOX) injection  40 mg Subcutaneous Daily   hydrALAZINE  25 mg Oral BID   irbesartan  300 mg Oral Daily   lamoTRIgine  100 mg Oral BID   magnesium oxide  400 mg Oral Daily   rosuvastatin  10 mg Oral Daily   sodium chloride flush  3 mL Intravenous Q12H   traZODone  25 mg Oral QHS   PRN Meds: acetaminophen **OR** acetaminophen, hydrALAZINE, labetalol Continuous Infusions:  sodium chloride 75 mL/hr at 07/21/21 0305     LOS: 0 days  Time spent: Greater than 50% of the 35 minute visit was spent in counseling/coordination of care for the patient as laid out in the A&P.   Dwyane Dee, MD [facility] 07/21/2021, 4:23 PM

## 2021-07-22 DIAGNOSIS — F0390 Unspecified dementia without behavioral disturbance: Secondary | ICD-10-CM | POA: Diagnosis not present

## 2021-07-22 DIAGNOSIS — M6281 Muscle weakness (generalized): Secondary | ICD-10-CM | POA: Diagnosis not present

## 2021-07-22 LAB — BASIC METABOLIC PANEL
Anion gap: 9 (ref 5–15)
BUN: 8 mg/dL (ref 8–23)
CO2: 23 mmol/L (ref 22–32)
Calcium: 9.6 mg/dL (ref 8.9–10.3)
Chloride: 109 mmol/L (ref 98–111)
Creatinine, Ser: 0.76 mg/dL (ref 0.44–1.00)
GFR, Estimated: >60 mL/min (ref >60)
Glucose, Bld: 110 mg/dL — ABNORMAL HIGH (ref 70–99)
Potassium: 3.1 mmol/L — ABNORMAL LOW (ref 3.5–5.1)
Sodium: 141 mmol/L (ref 135–145)

## 2021-07-22 LAB — MAGNESIUM: Magnesium: 2.3 mg/dL (ref 1.7–2.4)

## 2021-07-22 MED ORDER — POTASSIUM CHLORIDE CRYS ER 20 MEQ PO TBCR
40.0000 meq | EXTENDED_RELEASE_TABLET | Freq: Once | ORAL | Status: AC
  Administered 2021-07-22: 40 meq via ORAL
  Filled 2021-07-22: qty 2

## 2021-07-22 MED ORDER — AMLODIPINE BESYLATE 5 MG PO TABS
5.0000 mg | ORAL_TABLET | Freq: Every day | ORAL | Status: DC
  Administered 2021-07-22: 5 mg via ORAL
  Filled 2021-07-22: qty 1

## 2021-07-22 NOTE — Evaluation (Signed)
Physical Therapy Evaluation Patient Details Name: SAMIR LEYMAN MRN: [id number] DOB: 1950-12-04 Today's Date: 07/22/2021  History of Present Illness  70 yo female with PMH dementia, hypertension, anxiety, urinary incontinence, rapid functional decline.  She presented to the ER with weakness, multiple falls at home, and ongoing poor mentation. Dx of hypokalemia, hypertension, progression of dementia.  Clinical Impression  Pt admitted with above diagnosis. Pt ambulated 36' with RW with min assist for balance and to manage RW. She has decreased safety awareness with ambulation and did not respond to instructions to walk at a safe distance from RW. She is oriented to self only, pleasantly confused. 24* assistance/supervision recommended. She is a high fall risk.  Pt currently with functional limitations due to the deficits listed below (see PT Problem List). Pt will benefit from skilled PT to increase their independence and safety with mobility to allow discharge to the venue listed below.          Recommendations for follow up therapy are one component of a multi-disciplinary discharge planning process, led by the attending physician.  Recommendations may be updated based on patient status, additional functional criteria and insurance authorization.  Follow Up Recommendations SNF;Supervision/Assistance - 24 hour;Supervision for mobility/OOB    Equipment Recommendations  None recommended by PT    Recommendations for Other Services       Precautions / Restrictions Precautions Precautions: Fall Precaution Comments: h/o falls Restrictions Weight Bearing Restrictions: No      Mobility  Bed Mobility Overal bed mobility: Needs Assistance Bed Mobility: Supine to Sit;Sit to Supine     Supine to sit: HOB elevated;Min assist Sit to supine: Mod assist   General bed mobility comments: assist to raise trunk, then for LEs into bed    Transfers Overall transfer level: Needs  assistance Equipment used: Rolling walker (2 wheeled) Transfers: Sit to/from Stand Sit to Stand: Min assist         General transfer comment: Min assist for all standing and ambulation for steadying as patient exhibits tremors/shakiness throughout body as well as a tendency for right lateral lean.  Ambulation/Gait Ambulation/Gait assistance: Min assist Gait Distance (Feet): 50 Feet Assistive device: Rolling walker (2 wheeled) Gait Pattern/deviations: Step-through pattern;Shuffle;Decreased stride length;Trunk flexed Gait velocity: decr   General Gait Details: very short step length, did not respond to commands to take bigger steps, nor to step closer to RW, minA to manage RW and for balance  Stairs            Wheelchair Mobility    Modified Rankin (Stroke Patients Only)       Balance Overall balance assessment: Needs assistance Sitting-balance support: Feet supported Sitting balance-Leahy Scale: Fair Sitting balance - Comments: leaning to the right - but not a loss of balance Postural control: Right lateral lean Standing balance support: During functional activity Standing balance-Leahy Scale: Poor                               Pertinent Vitals/Pain Pain Assessment: No/denies pain    Home Living Family/patient expects to be discharged to:: Skilled nursing facility Living Arrangements: Spouse/significant other                    Prior Function Level of Independence: Needs assistance   Gait / Transfers Assistance Needed: walks with walker and or furniture self - husband reports increasing difficulty  ADL's / Homemaking Assistance Needed: has an aide that showers  patient tuesday and thursday, husband assists with dressing. Reports she was going to the bathroom but having more difficulty. Wears depends.  Comments: pt not able to provide hx 2* confusion     Hand Dominance   Dominant Hand: Right    Extremity/Trunk Assessment   Upper  Extremity Assessment Upper Extremity Assessment: Defer to OT evaluation    Lower Extremity Assessment Lower Extremity Assessment: Overall WFL for tasks assessed    Cervical / Trunk Assessment Cervical / Trunk Assessment: Kyphotic  Communication   Communication: No difficulties  Cognition Arousal/Alertness: Awake/alert Behavior During Therapy: WFL for tasks assessed/performed Overall Cognitive Status: No family/caregiver present to determine baseline cognitive functioning                                 General Comments: Dementia. Oriented to  self only and able to follow commands with verbal and tactile cues.      General Comments      Exercises     Assessment/Plan    PT Assessment Patient needs continued PT services  PT Problem List Decreased balance;Decreased activity tolerance;Decreased mobility;Decreased cognition       PT Treatment Interventions DME instruction;Gait training;Functional mobility training;Therapeutic exercise;Therapeutic activities;Patient/family education    PT Goals (Current goals can be found in the Care Plan section)  Acute Rehab PT Goals Patient Stated Goal: to get her moving better and safely (per OT note) PT Goal Formulation: With family Time For Goal Achievement: 08/05/21 Potential to Achieve Goals: Fair    Frequency Min 2X/week   Barriers to discharge        Co-evaluation               AM-PAC PT "6 Clicks" Mobility  Outcome Measure Help needed turning from your back to your side while in a flat bed without using bedrails?: A Little Help needed moving from lying on your back to sitting on the side of a flat bed without using bedrails?: A Little Help needed moving to and from a bed to a chair (including a wheelchair)?: A Little Help needed standing up from a chair using your arms (e.g., wheelchair or bedside chair)?: A Little Help needed to walk in hospital room?: A Lot Help needed climbing 3-5 steps with a  railing? : Total 6 Click Score: 15    End of Session Equipment Utilized During Treatment: Gait belt Activity Tolerance: Patient tolerated treatment well Patient left: in bed;with bed alarm set;with call bell/phone within reach Nurse Communication: Mobility status PT Visit Diagnosis: Unsteadiness on feet (R26.81);Difficulty in walking, not elsewhere classified (R26.2);History of falling (Z91.81)    Time: [id number] PT Time Calculation (min) (ACUTE ONLY): 13 min   Charges:   PT Evaluation $PT Eval Moderate Complexity: 1 Mod         Philomena Doheny PT 07/22/2021  Acute Rehabilitation Services Pager [phone] Office [phone]

## 2021-07-22 NOTE — Assessment & Plan Note (Signed)
-   see encephalomalacia

## 2021-07-22 NOTE — Evaluation (Signed)
Occupational Therapy Evaluation Patient Details Name: Sarah Cook MRN: [id number] DOB: [birth year] Today's Date: 07/22/2021   History of Present Illness 70 yo female with PMH dementia, hypertension, anxiety, urinary incontinence, rapid functional decline.  She presented to the ER with weakness, multiple falls at home, and ongoing poor mentation. Dx of hypokalemia, hypertension, progression of dementia.   Clinical Impression   Sarah Cook is a 70 year old woman who presents with cognitive deficits from dementia including but not limited to short term memory loss, impaired safety awareness, and impaired attention span as well as generalized weakness, decreased activity tolerance and impaired balance. Patient's spouse reports she is able to assist with ADLs and has been toileting independently and walking without assistance but that it has become more difficulty and unsafe. Today patient required min assist for all standing and ambulation for steadying and walker management. She also exhibits a right lateral lean in sitting in the chair and on the toilet making her sitting balance also impaired. Patient needing increased assistance for toileting and LB dressing. Patient will benefit from skilled OT services while in hospital to improve deficits and learn compensatory strategies as needed in order to return to PLOF. Patient will benefit from short term rehab at discharge to maximize functional abilities and safety in order to reduce caregiver burden and to return home.        Recommendations for follow up therapy are one component of a multi-disciplinary discharge planning process, led by the attending physician.  Recommendations may be updated based on patient status, additional functional criteria and insurance authorization.   Follow Up Recommendations  SNF    Equipment Recommendations  None recommended by OT    Recommendations for Other Services       Precautions / Restrictions  Precautions Precautions: Fall Restrictions Weight Bearing Restrictions: No      Mobility Bed Mobility Overal bed mobility: Needs Assistance Bed Mobility: Supine to Sit     Supine to sit: HOB elevated;Min guard          Transfers Overall transfer level: Needs assistance Equipment used: Rolling walker (2 wheeled) Transfers: Sit to/from Stand Sit to Stand: Min assist         General transfer comment: Min assist for all standing and ambulation for steadying as patient exhibits tremors/shakiness throughout body as well as a tendency for right lateral lean.    Balance Overall balance assessment: Needs assistance Sitting-balance support: Feet supported Sitting balance-Leahy Scale: Fair Sitting balance - Comments: leaning to the right - but not a loss of balance Postural control: Right lateral lean Standing balance support: During functional activity Standing balance-Leahy Scale: Poor                             ADL either performed or assessed with clinical judgement   ADL Overall ADL's : Needs assistance/impaired Eating/Feeding: Set up;Supervision/ safety;Cueing for sequencing   Grooming: Set up;Cueing for sequencing;Wash/dry hands   Upper Body Bathing: Moderate assistance;Cueing for sequencing;Set up   Lower Body Bathing: Maximal assistance;Set up;Cueing for sequencing   Upper Body Dressing : Maximal assistance;Set up;Cueing for sequencing   Lower Body Dressing: Total assistance Lower Body Dressing Details (indicate cue type and reason): to don socks and manage LB clothing     Toileting- Clothing Manipulation and Hygiene: Maximal assistance Toileting - Clothing Manipulation Details (indicate cue type and reason): patient assisting with clothing management and wiping periarea but very unsafe with a  right lateral lean - therapist also assisting with clothing management perianal care     Functional mobility during ADLs: Minimal assistance;Rolling  walker;Cueing for safety;Cueing for sequencing       Vision   Vision Assessment?: No apparent visual deficits Additional Comments: grossly able to read the clock (incorrectly once and then correctly), noticed items in the floor.     Perception     Praxis      Pertinent Vitals/Pain Pain Assessment: No/denies pain     Hand Dominance Right   Extremity/Trunk Assessment Upper Extremity Assessment Upper Extremity Assessment: Overall WFL for tasks assessed   Lower Extremity Assessment Lower Extremity Assessment: Defer to PT evaluation   Cervical / Trunk Assessment Cervical / Trunk Assessment: Kyphotic   Communication Communication Communication: No difficulties   Cognition Arousal/Alertness: Awake/alert Behavior During Therapy: WFL for tasks assessed/performed Overall Cognitive Status: History of cognitive impairments - at baseline                                 General Comments: Dementia. Alert to self and able to follow commands with verbal and tactile cues   General Comments       Exercises     Shoulder Instructions      Home Living Family/patient expects to be discharged to:: Skilled nursing facility Living Arrangements: Spouse/significant other                                      Prior Functioning/Environment Level of Independence: Needs assistance  Gait / Transfers Assistance Needed: walks with walker and or furniture self - husband reports increasing difficulty ADL's / Homemaking Assistance Needed: has an aide that showers patient tuesday and thursday, husband assists with dressing. Reports she was going to the bathroom but having more difficulty. Wears depends.            OT Problem List: Decreased activity tolerance;Decreased safety awareness;Decreased cognition;Decreased coordination;Decreased knowledge of use of DME or AE      OT Treatment/Interventions: Self-care/ADL training;Therapeutic exercise;DME and/or AE  instruction;Therapeutic activities;Balance training;Patient/family education    OT Goals(Current goals can be found in the care plan section) Acute Rehab OT Goals Patient Stated Goal: to get her moving better and safely OT Goal Formulation: With family Time For Goal Achievement: 08/05/21 Potential to Achieve Goals: Fair  OT Frequency: Min 2X/week   Barriers to D/C:            Co-evaluation              AM-PAC OT "6 Clicks" Daily Activity     Outcome Measure Help from another person eating meals?: A Little Help from another person taking care of personal grooming?: A Little Help from another person toileting, which includes using toliet, bedpan, or urinal?: A Lot Help from another person bathing (including washing, rinsing, drying)?: A Lot Help from another person to put on and taking off regular upper body clothing?: A Lot Help from another person to put on and taking off regular lower body clothing?: Total 6 Click Score: 13   End of Session Equipment Utilized During Treatment: Rolling walker Nurse Communication: Mobility status  Activity Tolerance: Patient tolerated treatment well Patient left: in chair;with call bell/phone within reach;with chair alarm set  OT Visit Diagnosis: Unsteadiness on feet (R26.81);Other symptoms and signs involving cognitive function  Time: 0921-0950 OT Time Calculation (min): 29 min Charges:  OT General Charges $OT Visit: 1 Visit OT Evaluation $OT Eval Moderate Complexity: 1 Mod OT Treatments $Self Care/Home Management : 8-22 mins  Audryanna Zurita, OTR/L [facility]  Office [phone] Pager: [place] 07/22/2021, 10:17 AM

## 2021-07-22 NOTE — Progress Notes (Signed)
Progress Note    Sarah Cook   [id number]  DOB: 1951/07/16  DOA: 07/19/2021     1  PCP: Cari Caraway, MD  Initial CC: weakness, falls at home  Hospital Course: Sarah Cook is a 70 yo female with PMH dementia, hypertension, anxiety, urinary incontinence, rapid functional decline.  She presented to the ER with weakness, multiple falls at home, and ongoing poor mentation. She was also recently evaluated by neurology on 06/23/2021.  She had similar symptoms at that time.  She was placed empirically on lamotrigine however there was no witnessed seizure activity at home but she is noted to be at risk for partial seizure. Etiology of her symptoms is suspected to be due to her underlying dementia with cerebral atrophy/encephalomalacia.  Interval History:  No events overnight.  Resting comfortably in bed this morning.  Remains confused from her underlying dementia which is at baseline. Could not remember if she ate breakfast or not.  ROS: Review of systems not obtained due to patient factors. Cognitive impairment   Assessment & Plan: * Generalized muscle weakness - Suspect this is progressive and due to her underlying dementia - Has had multiple falls at home - PT/OT consulted: SNF recommended  - stable for d/c once facility found and insurance approved  Encephalomalacia - History of worsening mentation and disorientation.  Has undergone recent MRI brain on 06/28/2021 showing underlying atrophy and chronic small vessel disease.  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  Per neurology, at risk for partial seizure - Has been started on lamotrigine outpatient  Hypokalemia - Likely due to poor oral intake at home.  No overt report of nausea/vomiting or diarrhea - Continue repleting as necessary - Continue following magnesium as well  Hypertensive urgency - BP escalating after admission; requiring IV labetalol for help with control - resuming  amlodipine, irbesartan, hydralazine, and nebivolol - continue PRN labetalol and hydralazine   Hypophosphatemia - Replete and recheck as needed  Urinary incontinence - Also chronic issue at home - Continue adult briefs; pure wick okay too   Old records reviewed in assessment of this patient  Antimicrobials:   DVT prophylaxis: enoxaparin (LOVENOX) injection 40 mg Start: 07/20/21 1300   Code Status:   Code Status: Full Code Family Communication: Husband  Disposition Plan: Status is: Inpatient  Remains inpatient appropriate because:IV treatments appropriate due to intensity of illness or inability to take PO and Inpatient level of care appropriate due to severity of illness  Dispo: The patient is from: Home              Anticipated d/c is to: SNF              Patient currently is medically stable to d/c.   Difficult to place patient No  Risk of unplanned readmission score: Unplanned Admission- Pilot do not use: 14.74   Objective: Blood pressure (!) 152/79, pulse 87, temperature 98.6 F (37 C), temperature source Oral, resp. rate 18, height '5\' 1"'$  (1.549 m), weight 77.1 kg, SpO2 97 %.  Examination: General appearance: Awake and alert.  Follows commands.  Underlying dementia appreciated Head: Normocephalic, without obvious abnormality, atraumatic Eyes:  EOMI Lungs: clear to auscultation bilaterally Heart: regular rate and rhythm and S1, S2 normal Abdomen: normal findings: bowel sounds normal and soft, non-tender Extremities:  1+ LE edema Skin: mobility and turgor normal Neurologic: Follows commands, moves all 4 extremities.  No focal weakness  Consultants:    Procedures:    Data  Reviewed: I have personally reviewed following labs and imaging studies Results for orders placed or performed during the hospital encounter of 07/19/21 (from the past 24 hour(s))  Basic metabolic panel     Status: Abnormal   Collection Time: 07/22/21  3:59 AM  Result Value Ref Range    Sodium 141 135 - 145 mmol/L   Potassium 3.1 (L) 3.5 - 5.1 mmol/L   Chloride 109 98 - 111 mmol/L   CO2 23 22 - 32 mmol/L   Glucose, Bld 110 (H) 70 - 99 mg/dL   BUN 8 8 - 23 mg/dL   Creatinine, Ser 0.76 0.44 - 1.00 mg/dL   Calcium 9.6 8.9 - 10.3 mg/dL   GFR, Estimated >60 >60 mL/min   Anion gap 9 5 - 15  Magnesium     Status: None   Collection Time: 07/22/21  3:59 AM  Result Value Ref Range   Magnesium 2.3 1.7 - 2.4 mg/dL    Recent Results (from the past 240 hour(s))  Resp Panel by RT-PCR (Flu A&B, Covid) Nasopharyngeal Swab     Status: None   Collection Time: 07/20/21 12:07 AM   Specimen: Nasopharyngeal Swab; Nasopharyngeal(NP) swabs in vial transport medium  Result Value Ref Range Status   SARS Coronavirus 2 by RT PCR NEGATIVE NEGATIVE Final    Comment: (NOTE) SARS-CoV-2 target nucleic acids are NOT DETECTED.  The SARS-CoV-2 RNA is generally detectable in upper respiratory specimens during the acute phase of infection. The lowest concentration of SARS-CoV-2 viral copies this assay can detect is 138 copies/mL. A negative result does not preclude SARS-Cov-2 infection and should not be used as the sole basis for treatment or other patient management decisions. A negative result may occur with  improper specimen collection/handling, submission of specimen other than nasopharyngeal swab, presence of viral mutation(s) within the areas targeted by this assay, and inadequate number of viral copies(<138 copies/mL). A negative result must be combined with clinical observations, patient history, and epidemiological information. The expected result is Negative.  Fact Sheet for Patients:  EntrepreneurPulse.com.au  Fact Sheet for Healthcare Providers:  IncredibleEmployment.be  This test is no t yet approved or cleared by the [place] FDA and  has been authorized for detection and/or diagnosis of SARS-CoV-2 by FDA under an Emergency Use  Authorization (EUA). This EUA will remain  in effect (meaning this test can be used) for the duration of the COVID-19 declaration under Section 564(b)(1) of the Act, 21 U.S.C.section 360bbb-3(b)(1), unless the authorization is terminated  or revoked sooner.       Influenza A by PCR NEGATIVE NEGATIVE Final   Influenza B by PCR NEGATIVE NEGATIVE Final    Comment: (NOTE) The Xpert Xpress SARS-CoV-2/FLU/RSV plus assay is intended as an aid in the diagnosis of influenza from Nasopharyngeal swab specimens and should not be used as a sole basis for treatment. Nasal washings and aspirates are unacceptable for Xpert Xpress SARS-CoV-2/FLU/RSV testing.  Fact Sheet for Patients: EntrepreneurPulse.com.au  Fact Sheet for Healthcare Providers: IncredibleEmployment.be  This test is not yet approved or cleared by the [place] FDA and has been authorized for detection and/or diagnosis of SARS-CoV-2 by FDA under an Emergency Use Authorization (EUA). This EUA will remain in effect (meaning this test can be used) for the duration of the COVID-19 declaration under Section 564(b)(1) of the Act, 21 U.S.C. section 360bbb-3(b)(1), unless the authorization is terminated or revoked.  Performed at [facility], [address], [place], [place] [address]  Radiology Studies: No results found. DG Chest Port 1 View  Final Result      Scheduled Meds:  amLODipine  5 mg Oral Daily   enoxaparin (LOVENOX) injection  40 mg Subcutaneous Daily   hydrALAZINE  25 mg Oral BID   irbesartan  300 mg Oral Daily   lamoTRIgine  100 mg Oral BID   magnesium oxide  400 mg Oral Daily   rosuvastatin  10 mg Oral Daily   sodium chloride flush  3 mL Intravenous Q12H   PRN Meds: acetaminophen **OR** acetaminophen, hydrALAZINE, labetalol Continuous Infusions:  sodium chloride Stopped (07/22/21 0930)     LOS: 1 day  Time spent: Greater than 50% of the 35  minute visit was spent in counseling/coordination of care for the patient as laid out in the A&P.   Dwyane Dee, MD [facility] 07/22/2021, 2:28 PM

## 2021-07-22 NOTE — Progress Notes (Signed)
Pt's potassium 3.1. MD paged.

## 2021-07-22 NOTE — Plan of Care (Signed)

## 2021-07-23 DIAGNOSIS — F0390 Unspecified dementia without behavioral disturbance: Secondary | ICD-10-CM | POA: Diagnosis not present

## 2021-07-23 DIAGNOSIS — M6281 Muscle weakness (generalized): Secondary | ICD-10-CM | POA: Diagnosis not present

## 2021-07-23 LAB — BASIC METABOLIC PANEL
Anion gap: 5 (ref 5–15)
BUN: 10 mg/dL (ref 8–23)
CO2: 23 mmol/L (ref 22–32)
Calcium: 9.2 mg/dL (ref 8.9–10.3)
Chloride: 112 mmol/L — ABNORMAL HIGH (ref 98–111)
Creatinine, Ser: 0.72 mg/dL (ref 0.44–1.00)
GFR, Estimated: >60 mL/min (ref >60)
Glucose, Bld: 108 mg/dL — ABNORMAL HIGH (ref 70–99)
Potassium: 3.7 mmol/L (ref 3.5–5.1)
Sodium: 140 mmol/L (ref 135–145)

## 2021-07-23 LAB — MAGNESIUM: Magnesium: 2 mg/dL (ref 1.7–2.4)

## 2021-07-23 MED ORDER — AMLODIPINE BESYLATE 10 MG PO TABS
10.0000 mg | ORAL_TABLET | Freq: Every day | ORAL | Status: DC
  Administered 2021-07-23 – 2021-07-27 (×5): 10 mg via ORAL
  Filled 2021-07-23 (×5): qty 1

## 2021-07-23 NOTE — Plan of Care (Signed)
  Problem: Clinical Measurements: Goal: Ability to maintain clinical measurements within normal limits will improve Outcome: Progressing Goal: Will remain free from infection Outcome: Progressing Goal: Diagnostic test results will improve Outcome: Progressing   Problem: Pain Managment: Goal: General experience of comfort will improve Outcome: Progressing   Problem: Safety: Goal: Ability to remain free from injury will improve Outcome: Progressing   

## 2021-07-23 NOTE — Progress Notes (Signed)
Progress Note    Sarah Cook   [id number]  DOB: 1951-08-17  DOA: 07/19/2021     2  PCP: Cari Caraway, MD  Initial CC: weakness, falls at home  Hospital Course: [name] is a 70 yo female with PMH dementia, hypertension, anxiety, urinary incontinence, rapid functional decline.  She presented to the ER with weakness, multiple falls at home, and ongoing poor mentation. She was also recently evaluated by neurology on 06/23/2021.  She had similar symptoms at that time.  She was placed empirically on lamotrigine however there was no witnessed seizure activity at home but she is noted to be at risk for partial seizure. Etiology of her symptoms is suspected to be due to her underlying dementia with cerebral atrophy/encephalomalacia.  Interval History:  No events overnight.  Resting comfortably in bed this morning.  Husband present bedside.  Mentation remains the same with underlying dementia appreciated.  ROS: Review of systems not obtained due to patient factors. Cognitive impairment   Assessment & Plan: * Generalized muscle weakness - Suspect this is progressive and due to her underlying dementia - Has had multiple falls at home - PT/OT consulted: SNF recommended  - stable for d/c once facility found and insurance approved  Encephalomalacia - History of worsening mentation and disorientation.  Has undergone recent MRI brain on 06/28/2021 showing underlying atrophy and chronic small vessel disease.  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  Per neurology, at risk for partial seizure - Has been started on lamotrigine outpatient  Hypokalemia - Likely due to poor oral intake at home.  No overt report of nausea/vomiting or diarrhea - Continue repleting as necessary - Continue following magnesium as well  Hypertensive urgency - BP escalating after admission; requiring IV labetalol for help with control - resuming amlodipine, irbesartan,  hydralazine, and nebivolol - continue PRN labetalol and hydralazine   Dementia without behavioral disturbance (HCC) - see encephalomalacia  Hypophosphatemia - Replete and recheck as needed  Urinary incontinence - Also chronic issue at home - Continue adult briefs; pure wick okay too   Old records reviewed in assessment of this patient  Antimicrobials:   DVT prophylaxis: enoxaparin (LOVENOX) injection 40 mg Start: 07/20/21 1300   Code Status:   Code Status: Full Code Family Communication: Husband  Disposition Plan: Status is: Inpatient  Remains inpatient appropriate because:IV treatments appropriate due to intensity of illness or inability to take PO and Inpatient level of care appropriate due to severity of illness  Dispo: The patient is from: Home              Anticipated d/c is to: SNF              Patient currently is medically stable to d/c.   Difficult to place patient No  Risk of unplanned readmission score: Unplanned Admission- Pilot do not use: 15.78   Objective: Blood pressure (!) 147/87, pulse 92, temperature 99 F (37.2 C), temperature source Oral, resp. rate 16, height '5\' 1"'$  (1.549 m), weight 77.1 kg, SpO2 97 %.  Examination: General appearance: Awake and alert.  Follows commands.  Underlying dementia appreciated Head: Normocephalic, without obvious abnormality, atraumatic Eyes:  EOMI Lungs: clear to auscultation bilaterally Heart: regular rate and rhythm and S1, S2 normal Abdomen: normal findings: bowel sounds normal and soft, non-tender Extremities:  1+ LE edema Skin: mobility and turgor normal Neurologic: Follows commands, moves all 4 extremities.  No focal weakness  Consultants:    Procedures:  Data Reviewed: I have personally reviewed following labs and imaging studies Results for orders placed or performed during the hospital encounter of 07/19/21 (from the past 24 hour(s))  Basic metabolic panel     Status: Abnormal   Collection Time:  07/23/21  3:47 AM  Result Value Ref Range   Sodium 140 135 - 145 mmol/L   Potassium 3.7 3.5 - 5.1 mmol/L   Chloride 112 (H) 98 - 111 mmol/L   CO2 23 22 - 32 mmol/L   Glucose, Bld 108 (H) 70 - 99 mg/dL   BUN 10 8 - 23 mg/dL   Creatinine, Ser 0.72 0.44 - 1.00 mg/dL   Calcium 9.2 8.9 - 10.3 mg/dL   GFR, Estimated >60 >60 mL/min   Anion gap 5 5 - 15  Magnesium     Status: None   Collection Time: 07/23/21  3:47 AM  Result Value Ref Range   Magnesium 2.0 1.7 - 2.4 mg/dL    Recent Results (from the past 240 hour(s))  Resp Panel by RT-PCR (Flu A&B, Covid) Nasopharyngeal Swab     Status: None   Collection Time: 07/20/21 12:07 AM   Specimen: Nasopharyngeal Swab; Nasopharyngeal(NP) swabs in vial transport medium  Result Value Ref Range Status   SARS Coronavirus 2 by RT PCR NEGATIVE NEGATIVE Final    Comment: (NOTE) SARS-CoV-2 target nucleic acids are NOT DETECTED.  The SARS-CoV-2 RNA is generally detectable in upper respiratory specimens during the acute phase of infection. The lowest concentration of SARS-CoV-2 viral copies this assay can detect is 138 copies/mL. A negative result does not preclude SARS-Cov-2 infection and should not be used as the sole basis for treatment or other patient management decisions. A negative result may occur with  improper specimen collection/handling, submission of specimen other than nasopharyngeal swab, presence of viral mutation(s) within the areas targeted by this assay, and inadequate number of viral copies(<138 copies/mL). A negative result must be combined with clinical observations, patient history, and epidemiological information. The expected result is Negative.  Fact Sheet for Patients:  EntrepreneurPulse.com.au  Fact Sheet for Healthcare Providers:  IncredibleEmployment.be  This test is no t yet approved or cleared by the [place] FDA and  has been authorized for detection and/or diagnosis of  SARS-CoV-2 by FDA under an Emergency Use Authorization (EUA). This EUA will remain  in effect (meaning this test can be used) for the duration of the COVID-19 declaration under Section 564(b)(1) of the Act, 21 U.S.C.section 360bbb-3(b)(1), unless the authorization is terminated  or revoked sooner.       Influenza A by PCR NEGATIVE NEGATIVE Final   Influenza B by PCR NEGATIVE NEGATIVE Final    Comment: (NOTE) The Xpert Xpress SARS-CoV-2/FLU/RSV plus assay is intended as an aid in the diagnosis of influenza from Nasopharyngeal swab specimens and should not be used as a sole basis for treatment. Nasal washings and aspirates are unacceptable for Xpert Xpress SARS-CoV-2/FLU/RSV testing.  Fact Sheet for Patients: EntrepreneurPulse.com.au  Fact Sheet for Healthcare Providers: IncredibleEmployment.be  This test is not yet approved or cleared by the [place] FDA and has been authorized for detection and/or diagnosis of SARS-CoV-2 by FDA under an Emergency Use Authorization (EUA). This EUA will remain in effect (meaning this test can be used) for the duration of the COVID-19 declaration under Section 564(b)(1) of the Act, 21 U.S.C. section 360bbb-3(b)(1), unless the authorization is terminated or revoked.  Performed at [facility], [address] [place], [place] [address]  Radiology Studies: No results found. DG Chest Port 1 View  Final Result      Scheduled Meds:  amLODipine  10 mg Oral Daily   enoxaparin (LOVENOX) injection  40 mg Subcutaneous Daily   hydrALAZINE  25 mg Oral BID   irbesartan  300 mg Oral Daily   lamoTRIgine  100 mg Oral BID   magnesium oxide  400 mg Oral Daily   rosuvastatin  10 mg Oral Daily   sodium chloride flush  3 mL Intravenous Q12H   PRN Meds: acetaminophen **OR** acetaminophen, hydrALAZINE, labetalol Continuous Infusions:  sodium chloride 75 mL/hr at 07/23/21 0158     LOS: 2  days    Dwyane Dee, MD [facility] 07/23/2021, 3:22 PM

## 2021-07-23 NOTE — NC FL2 (Signed)
[place] LEVEL OF CARE SCREENING TOOL     IDENTIFICATION  Patient Name: Sarah Cook Birthdate: 17-Oct-1951 Sex: female Admission Date (Current Location): 07/19/2021  [facility] and [place] Number:  Herbalist and Address:  [facility],  [address], [place]      Provider Number: [id number]  Attending Physician Name and Address:  Dwyane Dee, MD  Relative Name and Phone Number:  Wyonna, [place] [phone], Jerilynn Mages [phone]    Current Level of Care: Hospital Recommended Level of Care: [place] Prior Approval Number:    Date Approved/Denied:   PASRR Number: [id number]  Discharge Plan: SNF    Current Diagnoses: Patient Active Problem List   Diagnosis Date Noted   Dementia without behavioral disturbance ([place]) 07/22/2021   Hypertensive urgency 07/21/2021   Hypokalemia 07/20/2021   Generalized muscle weakness 07/20/2021   Encephalomalacia 07/20/2021   Urinary incontinence 07/20/2021   Hypophosphatemia 07/20/2021   Memory loss 06/23/2021   Confusion 06/23/2021   Encephalopathy acute 10/18/2017   Post-concussion headache 10/18/2017   Altered mental status [id number]   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[place]) 03/09/2016   Chest pain February 26, 2016   History of antineoplastic chemotherapy with cardiotoxic drugs 2016/02/26   Family history of sudden cardiac death in father 2016-02-26   Breast cancer [facility])    Hemorrhoids    Hiatal hernia    Pulmonary nodules    Hyperparathyroidism ([place])    Hypertension    Hyperlipidemia    Heart disease    Allergy    Anxiety    TIA (transient ischemic attack)    Cataract    Osteoporosis    Parathyroid disorder ([place])    Irritable bowel syndrome 12/11/2013   Headache(784.0) 12/10/2013    Orientation RESPIRATION BLADDER Height & Weight     Self  Normal Incontinent, Continent Weight: 77.1 kg Height:  '5\' 1"'$  (154.9 cm)   BEHAVIORAL SYMPTOMS/MOOD NEUROLOGICAL BOWEL NUTRITION STATUS      Incontinent, Continent Diet (Regular)  AMBULATORY STATUS COMMUNICATION OF NEEDS Skin   Extensive Assist Verbally Bruising (Ecchymosis elbow, left hand, rash on forehead,)                       Personal Care Assistance Level of Assistance  Bathing, Feeding, Dressing Bathing Assistance: Maximum assistance Feeding assistance: Independent Dressing Assistance: Maximum assistance     Functional Limitations Info  Sight, Hearing, Speech Sight Info: Adequate Hearing Info: Adequate Speech Info: Adequate    SPECIAL CARE FACTORS FREQUENCY  PT (By licensed PT), OT (By licensed OT)     PT Frequency: Eval and Treat OT Frequency: Eval and Treat            Contractures Contractures Info: Not present    Additional Factors Info  Code Status, Allergies Code Status Info: FULL Allergies Info: Anesthetics, Ester, Codeine, Tape, Hydrochlorothiazide, Onion, Latex           Current Medications (07/23/2021):  This is the current hospital active medication list Current Facility-Administered Medications  Medication Dose Route Frequency Provider Last Rate Last Admin   0.9 %  sodium chloride infusion   Intravenous Continuous Dwyane Dee, MD 75 mL/hr at 07/23/21 1530 New Bag at 07/23/21 1530   acetaminophen (TYLENOL) tablet 650 mg  650 mg Oral Q6H PRN Dwyane Dee, MD       Or   acetaminophen (TYLENOL) suppository 650 mg  650 mg Rectal Q6H PRN Dwyane Dee,  MD       amLODipine (NORVASC) tablet 10 mg  10 mg Oral Daily Dwyane Dee, MD   10 mg at 07/23/21 1025   enoxaparin (LOVENOX) injection 40 mg  40 mg Subcutaneous Daily Dwyane Dee, MD   40 mg at 07/23/21 1036   hydrALAZINE (APRESOLINE) tablet 25 mg  25 mg Oral Q4H PRN Dwyane Dee, MD   25 mg at 07/20/21 2054   hydrALAZINE (APRESOLINE) tablet 25 mg  25 mg Oral BID Dwyane Dee, MD   25 mg at 07/23/21 1025   irbesartan (AVAPRO) tablet 300 mg  300 mg Oral  Daily Dwyane Dee, MD   300 mg at 07/23/21 1025   labetalol (NORMODYNE) injection 10 mg  10 mg Intravenous Q4H PRN Dwyane Dee, MD   10 mg at 07/23/21 0615   lamoTRIgine (LAMICTAL) tablet 100 mg  100 mg Oral BID Dwyane Dee, MD   100 mg at 07/23/21 1026   magnesium oxide (MAG-OX) tablet 400 mg  400 mg Oral Daily Dwyane Dee, MD   400 mg at 07/23/21 1025   rosuvastatin (CRESTOR) tablet 10 mg  10 mg Oral Daily Dwyane Dee, MD   10 mg at 07/23/21 1026   sodium chloride flush (NS) 0.9 % injection 3 mL  3 mL Intravenous Eddie Candle, MD   3 mL at 07/22/21 2117     Discharge Medications: Please see discharge summary for a list of discharge medications.  Relevant Imaging Results:  Relevant Lab Results:   Additional Information [id number]  Purcell Mouton, RN

## 2021-07-23 NOTE — TOC Progression Note (Signed)
Transition of Care [facility]) - Progression Note    Patient Details  Name: Sarah Cook MRN: [id number] Date of Birth: 15-Oct-1951  [facility]) CM/SW Contact  Purcell Mouton, RN Phone Number: 07/23/2021, 4:12 PM  Clinical Narrative:    Husband was called with no answer, left VM to ask about SNF. Pt was fax to SNF.         Expected Discharge Plan and Services                                                 Social Determinants of Health (SDOH) Interventions    Readmission Risk Interventions No flowsheet data found.

## 2021-07-24 DIAGNOSIS — F0390 Unspecified dementia without behavioral disturbance: Secondary | ICD-10-CM | POA: Diagnosis not present

## 2021-07-24 DIAGNOSIS — M6281 Muscle weakness (generalized): Secondary | ICD-10-CM | POA: Diagnosis not present

## 2021-07-24 LAB — BASIC METABOLIC PANEL
Anion gap: 7 (ref 5–15)
BUN: 8 mg/dL (ref 8–23)
CO2: 25 mmol/L (ref 22–32)
Calcium: 9.9 mg/dL (ref 8.9–10.3)
Chloride: 111 mmol/L (ref 98–111)
Creatinine, Ser: 0.56 mg/dL (ref 0.44–1.00)
GFR, Estimated: >60 mL/min (ref >60)
Glucose, Bld: 110 mg/dL — ABNORMAL HIGH (ref 70–99)
Potassium: 3.1 mmol/L — ABNORMAL LOW (ref 3.5–5.1)
Sodium: 143 mmol/L (ref 135–145)

## 2021-07-24 LAB — MAGNESIUM: Magnesium: 1.8 mg/dL (ref 1.7–2.4)

## 2021-07-24 MED ORDER — POTASSIUM CHLORIDE CRYS ER 20 MEQ PO TBCR
40.0000 meq | EXTENDED_RELEASE_TABLET | Freq: Once | ORAL | Status: AC
  Administered 2021-07-24: 40 meq via ORAL
  Filled 2021-07-24: qty 2

## 2021-07-24 NOTE — Progress Notes (Signed)
Progress Note    Sarah Cook   [id number]  DOB: 05/07/1951  DOA: 07/19/2021     3  PCP: Cari Caraway, MD  Initial CC: weakness, falls at home  Hospital Course: Sarah Cook is a 70 yo female with PMH dementia, hypertension, anxiety, urinary incontinence, rapid functional decline.  She presented to the ER with weakness, multiple falls at home, and ongoing poor mentation. She was also recently evaluated by neurology on 06/23/2021.  She had similar symptoms at that time.  She was placed empirically on lamotrigine however there was no witnessed seizure activity at home but she is noted to be at risk for partial seizure. Etiology of her symptoms is suspected to be due to her underlying dementia with cerebral atrophy/encephalomalacia.  Interval History:  No events overnight.  Ate breakfast well with nurse aide this morning.  She did not remember what she ate and had to be told.  Otherwise resting comfortably in bed in no distress.  We are still waiting on placement at this point.  ROS: Review of systems not obtained due to patient factors. Cognitive impairment   Assessment & Plan: * Generalized muscle weakness - Suspect this is progressive and due to her underlying dementia - Has had multiple falls at home - PT/OT consulted: SNF recommended  - stable for d/c once facility found and insurance approved  Encephalomalacia - History of worsening mentation and disorientation.  Has undergone recent MRI brain on 06/28/2021 showing underlying atrophy and chronic small vessel disease.  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  Per neurology, at risk for partial seizure - Has been started on lamotrigine outpatient  Hypokalemia - Likely due to poor oral intake at home.  No overt report of nausea/vomiting or diarrhea - Continue repleting as necessary - Continue following magnesium as well  Hypertensive urgency - BP escalating after admission; requiring  IV labetalol for help with control - resuming amlodipine, irbesartan, hydralazine, and nebivolol - continue PRN labetalol and hydralazine   Dementia without behavioral disturbance (HCC) - see encephalomalacia  Hypophosphatemia - Replete and recheck as needed  Urinary incontinence - Also chronic issue at home - Continue adult briefs; pure wick okay too   Old records reviewed in assessment of this patient  Antimicrobials:   DVT prophylaxis: enoxaparin (LOVENOX) injection 40 mg Start: 07/20/21 1300   Code Status:   Code Status: Full Code Family Communication: Husband  Disposition Plan: Status is: Inpatient  Remains inpatient appropriate because:IV treatments appropriate due to intensity of illness or inability to take PO and Inpatient level of care appropriate due to severity of illness  Dispo: The patient is from: Home              Anticipated d/c is to: SNF              Patient currently is medically stable to d/c.   Difficult to place patient No  Risk of unplanned readmission score: Unplanned Admission- Pilot do not use: 14.45   Objective: Blood pressure (!) 148/79, pulse 86, temperature 98.4 F (36.9 C), temperature source Oral, resp. rate 20, height '5\' 1"'$  (1.549 m), weight 77.1 kg, SpO2 97 %.  Examination: General appearance: Awake and alert.  Follows commands.  Underlying dementia appreciated Head: Normocephalic, without obvious abnormality, atraumatic Eyes:  EOMI Lungs: clear to auscultation bilaterally Heart: regular rate and rhythm and S1, S2 normal Abdomen: normal findings: bowel sounds normal and soft, non-tender Extremities:  1+ LE edema Skin: mobility and  turgor normal Neurologic: Follows commands, moves all 4 extremities.  No focal weakness  Consultants:    Procedures:    Data Reviewed: I have personally reviewed following labs and imaging studies Results for orders placed or performed during the hospital encounter of 07/19/21 (from the past 24  hour(s))  Basic metabolic panel     Status: Abnormal   Collection Time: 07/24/21  4:02 AM  Result Value Ref Range   Sodium 143 135 - 145 mmol/L   Potassium 3.1 (L) 3.5 - 5.1 mmol/L   Chloride 111 98 - 111 mmol/L   CO2 25 22 - 32 mmol/L   Glucose, Bld 110 (H) 70 - 99 mg/dL   BUN 8 8 - 23 mg/dL   Creatinine, Ser 0.56 0.44 - 1.00 mg/dL   Calcium 9.9 8.9 - 10.3 mg/dL   GFR, Estimated >60 >60 mL/min   Anion gap 7 5 - 15  Magnesium     Status: None   Collection Time: 07/24/21  4:02 AM  Result Value Ref Range   Magnesium 1.8 1.7 - 2.4 mg/dL    Recent Results (from the past 240 hour(s))  Resp Panel by RT-PCR (Flu A&B, Covid) Nasopharyngeal Swab     Status: None   Collection Time: 07/20/21 12:07 AM   Specimen: Nasopharyngeal Swab; Nasopharyngeal(NP) swabs in vial transport medium  Result Value Ref Range Status   SARS Coronavirus 2 by RT PCR NEGATIVE NEGATIVE Final    Comment: (NOTE) SARS-CoV-2 target nucleic acids are NOT DETECTED.  The SARS-CoV-2 RNA is generally detectable in upper respiratory specimens during the acute phase of infection. The lowest concentration of SARS-CoV-2 viral copies this assay can detect is 138 copies/mL. A negative result does not preclude SARS-Cov-2 infection and should not be used as the sole basis for treatment or other patient management decisions. A negative result may occur with  improper specimen collection/handling, submission of specimen other than nasopharyngeal swab, presence of viral mutation(s) within the areas targeted by this assay, and inadequate number of viral copies(<138 copies/mL). A negative result must be combined with clinical observations, patient history, and epidemiological information. The expected result is Negative.  Fact Sheet for Patients:  EntrepreneurPulse.com.au  Fact Sheet for Healthcare Providers:  IncredibleEmployment.be  This test is no t yet approved or cleared by the [place] FDA and  has been authorized for detection and/or diagnosis of SARS-CoV-2 by FDA under an Emergency Use Authorization (EUA). This EUA will remain  in effect (meaning this test can be used) for the duration of the COVID-19 declaration under Section 564(b)(1) of the Act, 21 U.S.C.section 360bbb-3(b)(1), unless the authorization is terminated  or revoked sooner.       Influenza A by PCR NEGATIVE NEGATIVE Final   Influenza B by PCR NEGATIVE NEGATIVE Final    Comment: (NOTE) The Xpert Xpress SARS-CoV-2/FLU/RSV plus assay is intended as an aid in the diagnosis of influenza from Nasopharyngeal swab specimens and should not be used as a sole basis for treatment. Nasal washings and aspirates are unacceptable for Xpert Xpress SARS-CoV-2/FLU/RSV testing.  Fact Sheet for Patients: EntrepreneurPulse.com.au  Fact Sheet for Healthcare Providers: IncredibleEmployment.be  This test is not yet approved or cleared by the [place] FDA and has been authorized for detection and/or diagnosis of SARS-CoV-2 by FDA under an Emergency Use Authorization (EUA). This EUA will remain in effect (meaning this test can be used) for the duration of the COVID-19 declaration under Section 564(b)(1) of the Act, 21 U.S.C. section 360bbb-3(b)(1), unless  the authorization is terminated or revoked.  Performed at [facility], [address], [place], [place] [address]      Radiology Studies: No results found. DG Chest Port 1 View  Final Result      Scheduled Meds:  amLODipine  10 mg Oral Daily   enoxaparin (LOVENOX) injection  40 mg Subcutaneous Daily   hydrALAZINE  25 mg Oral BID   irbesartan  300 mg Oral Daily   lamoTRIgine  100 mg Oral BID   magnesium oxide  400 mg Oral Daily   rosuvastatin  10 mg Oral Daily   sodium chloride flush  3 mL Intravenous Q12H   PRN Meds: acetaminophen **OR** acetaminophen, hydrALAZINE,  labetalol Continuous Infusions:     LOS: 3 days    Dwyane Dee, MD [facility] 07/24/2021, 12:41 PM

## 2021-07-24 NOTE — Plan of Care (Signed)
  Problem: Clinical Measurements: Goal: Will remain free from infection Outcome: Progressing Goal: Respiratory complications will improve Outcome: Progressing Goal: Cardiovascular complication will be avoided Outcome: Progressing   Problem: Nutrition: Goal: Adequate nutrition will be maintained Outcome: Progressing   Problem: Safety: Goal: Ability to remain free from injury will improve Outcome: Progressing

## 2021-07-25 DIAGNOSIS — F0390 Unspecified dementia without behavioral disturbance: Secondary | ICD-10-CM | POA: Diagnosis not present

## 2021-07-25 DIAGNOSIS — M6281 Muscle weakness (generalized): Secondary | ICD-10-CM | POA: Diagnosis not present

## 2021-07-25 MED ORDER — HYDRALAZINE HCL 25 MG PO TABS
25.0000 mg | ORAL_TABLET | Freq: Three times a day (TID) | ORAL | Status: DC
  Administered 2021-07-25 (×3): 25 mg via ORAL
  Filled 2021-07-25 (×3): qty 1

## 2021-07-25 NOTE — Progress Notes (Signed)
Progress Note    Sarah Cook   [id number]  DOB: 31-Aug-1951  DOA: 07/19/2021     4  PCP: Cari Caraway, MD  Initial CC: weakness, falls at home  Hospital Course: [name] is a 70 yo female with PMH dementia, hypertension, anxiety, urinary incontinence, rapid functional decline.  She presented to the ER with weakness, multiple falls at home, and ongoing poor mentation. She was also recently evaluated by neurology on 06/23/2021.  She had similar symptoms at that time.  She was placed empirically on lamotrigine however there was no witnessed seizure activity at home but she is noted to be at risk for partial seizure. Etiology of her symptoms is suspected to be due to her underlying dementia with cerebral atrophy/encephalomalacia.  Interval History:  No events overnight.  Resting in bed in no distress.  Did not really remember me much today until talking with me further and looking at my badge some.  ROS: Review of systems not obtained due to patient factors. Cognitive impairment   Assessment & Plan: * Generalized muscle weakness - Suspect this is progressive and due to her underlying dementia - Has had multiple falls at home - PT/OT consulted: SNF recommended  - stable for d/c once facility found and insurance approved  Encephalomalacia - History of worsening mentation and disorientation.  Has undergone recent MRI brain on 06/28/2021 showing underlying atrophy and chronic small vessel disease.  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  Per neurology, at risk for partial seizure - Has been started on lamotrigine outpatient  Hypokalemia - Likely due to poor oral intake at home.  No overt report of nausea/vomiting or diarrhea - Continue repleting as necessary - Continue following magnesium as well  Hypertensive urgency - BP escalating after admission; requiring IV labetalol for help with control - resuming amlodipine, irbesartan,  hydralazine, and nebivolol - continue PRN labetalol and hydralazine   Dementia without behavioral disturbance (HCC) - see encephalomalacia  Hypophosphatemia - Replete and recheck as needed  Urinary incontinence - Also chronic issue at home - Continue adult briefs; pure wick okay too   Old records reviewed in assessment of this patient  Antimicrobials:   DVT prophylaxis: enoxaparin (LOVENOX) injection 40 mg Start: 07/20/21 1300   Code Status:   Code Status: Full Code Family Communication: Husband  Disposition Plan: Status is: Inpatient  Remains inpatient appropriate because:IV treatments appropriate due to intensity of illness or inability to take PO and Inpatient level of care appropriate due to severity of illness  Dispo: The patient is from: Home              Anticipated d/c is to: SNF              Patient currently is medically stable to d/c.   Difficult to place patient No  Risk of unplanned readmission score: Unplanned Admission- Pilot do not use: 14.67   Objective: Blood pressure (!) 152/76, pulse 85, temperature 98.3 F (36.8 C), temperature source Oral, resp. rate 16, height '5\' 1"'$  (1.549 m), weight 77.1 kg, SpO2 96 %.  Examination: General appearance: Awake and alert.  Follows commands.  Underlying dementia appreciated Head: Normocephalic, without obvious abnormality, atraumatic Eyes:  EOMI Lungs: clear to auscultation bilaterally Heart: regular rate and rhythm and S1, S2 normal Abdomen: normal findings: bowel sounds normal and soft, non-tender Extremities:  trace LE edema Skin: mobility and turgor normal Neurologic: Follows commands, moves all 4 extremities.  No focal weakness  Consultants:  Procedures:    Data Reviewed: I have personally reviewed following labs and imaging studies No results found for this or any previous visit (from the past 24 hour(s)).   Recent Results (from the past 240 hour(s))  Resp Panel by RT-PCR (Flu A&B, Covid)  Nasopharyngeal Swab     Status: None   Collection Time: 07/20/21 12:07 AM   Specimen: Nasopharyngeal Swab; Nasopharyngeal(NP) swabs in vial transport medium  Result Value Ref Range Status   SARS Coronavirus 2 by RT PCR NEGATIVE NEGATIVE Final    Comment: (NOTE) SARS-CoV-2 target nucleic acids are NOT DETECTED.  The SARS-CoV-2 RNA is generally detectable in upper respiratory specimens during the acute phase of infection. The lowest concentration of SARS-CoV-2 viral copies this assay can detect is 138 copies/mL. A negative result does not preclude SARS-Cov-2 infection and should not be used as the sole basis for treatment or other patient management decisions. A negative result may occur with  improper specimen collection/handling, submission of specimen other than nasopharyngeal swab, presence of viral mutation(s) within the areas targeted by this assay, and inadequate number of viral copies(<138 copies/mL). A negative result must be combined with clinical observations, patient history, and epidemiological information. The expected result is Negative.  Fact Sheet for Patients:  EntrepreneurPulse.com.au  Fact Sheet for Healthcare Providers:  IncredibleEmployment.be  This test is no t yet approved or cleared by the [place] FDA and  has been authorized for detection and/or diagnosis of SARS-CoV-2 by FDA under an Emergency Use Authorization (EUA). This EUA will remain  in effect (meaning this test can be used) for the duration of the COVID-19 declaration under Section 564(b)(1) of the Act, 21 U.S.C.section 360bbb-3(b)(1), unless the authorization is terminated  or revoked sooner.       Influenza A by PCR NEGATIVE NEGATIVE Final   Influenza B by PCR NEGATIVE NEGATIVE Final    Comment: (NOTE) The Xpert Xpress SARS-CoV-2/FLU/RSV plus assay is intended as an aid in the diagnosis of influenza from Nasopharyngeal swab specimens and should not be  used as a sole basis for treatment. Nasal washings and aspirates are unacceptable for Xpert Xpress SARS-CoV-2/FLU/RSV testing.  Fact Sheet for Patients: EntrepreneurPulse.com.au  Fact Sheet for Healthcare Providers: IncredibleEmployment.be  This test is not yet approved or cleared by the [place] FDA and has been authorized for detection and/or diagnosis of SARS-CoV-2 by FDA under an Emergency Use Authorization (EUA). This EUA will remain in effect (meaning this test can be used) for the duration of the COVID-19 declaration under Section 564(b)(1) of the Act, 21 U.S.C. section 360bbb-3(b)(1), unless the authorization is terminated or revoked.  Performed at [facility], [address] [place], [place] [address]      Radiology Studies: No results found. DG Chest Port 1 View  Final Result      Scheduled Meds:  amLODipine  10 mg Oral Daily   enoxaparin (LOVENOX) injection  40 mg Subcutaneous Daily   hydrALAZINE  25 mg Oral TID   irbesartan  300 mg Oral Daily   lamoTRIgine  100 mg Oral BID   magnesium oxide  400 mg Oral Daily   rosuvastatin  10 mg Oral Daily   sodium chloride flush  3 mL Intravenous Q12H   PRN Meds: acetaminophen **OR** acetaminophen, hydrALAZINE, labetalol Continuous Infusions:     LOS: 4 days    Dwyane Dee, MD [facility] 07/25/2021, 1:40 PM

## 2021-07-26 ENCOUNTER — Other Ambulatory Visit: Payer: Medicare PPO

## 2021-07-26 DIAGNOSIS — F0390 Unspecified dementia without behavioral disturbance: Secondary | ICD-10-CM | POA: Diagnosis not present

## 2021-07-26 DIAGNOSIS — M6281 Muscle weakness (generalized): Secondary | ICD-10-CM | POA: Diagnosis not present

## 2021-07-26 LAB — RESP PANEL BY RT-PCR (FLU A&B, COVID) ARPGX2
Influenza A by PCR: NEGATIVE
Influenza B by PCR: NEGATIVE
SARS Coronavirus 2 by RT PCR: NEGATIVE

## 2021-07-26 MED ORDER — HYDRALAZINE HCL 25 MG PO TABS
37.5000 mg | ORAL_TABLET | Freq: Three times a day (TID) | ORAL | Status: DC
  Administered 2021-07-26 – 2021-07-27 (×5): 37.5 mg via ORAL
  Filled 2021-07-26 (×5): qty 2

## 2021-07-26 NOTE — Care Management Important Message (Signed)
Important Message  Patient Details IM Letter given to the Patient. Name: Sarah Cook MRN: [id number] Date of Birth: 10-07-1951   Medicare Important Message Given:  Yes     Kerin Salen 07/26/2021, 12:30 PM

## 2021-07-26 NOTE — Progress Notes (Signed)
Progress Note    Sarah Cook   [id number]  DOB: November 24, 1950  DOA: 07/19/2021     5  PCP: Cari Caraway, MD  Initial CC: weakness, falls at home  Hospital Course: Sarah Cook is a 70 yo female with PMH dementia, hypertension, anxiety, urinary incontinence, rapid functional decline.  She presented to the ER with weakness, multiple falls at home, and ongoing poor mentation. She was also recently evaluated by neurology on 06/23/2021.  She had similar symptoms at that time.  She was placed empirically on lamotrigine however there was no witnessed seizure activity at home but she is noted to be at risk for partial seizure. Etiology of her symptoms is suspected to be due to her underlying dementia with cerebral atrophy/encephalomalacia.  Interval History:  No changes. Pleasantly demented.   ROS: Review of systems not obtained due to patient factors. Cognitive impairment   Assessment & Plan: * Generalized muscle weakness - Suspect this is progressive and due to her underlying dementia - Has had multiple falls at home - PT/OT consulted: SNF recommended  - stable for d/c once facility found and insurance approved  Encephalomalacia - History of worsening mentation and disorientation.  Has undergone recent MRI brain on 06/28/2021 showing underlying atrophy and chronic small vessel disease.  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  Per neurology, at risk for partial seizure - Has been started on lamotrigine outpatient  Hypokalemia - Likely due to poor oral intake at home.  No overt report of nausea/vomiting or diarrhea - Continue repleting as necessary - Continue following magnesium as well  Hypertensive urgency - BP escalating after admission; requiring IV labetalol for help with control - resuming amlodipine, irbesartan, hydralazine, and nebivolol - continue PRN labetalol and hydralazine   Dementia without behavioral disturbance (HCC) -  see encephalomalacia  Hypophosphatemia - Replete and recheck as needed  Urinary incontinence - Also chronic issue at home - Continue adult briefs; pure wick okay too   Old records reviewed in assessment of this patient  Antimicrobials:   DVT prophylaxis: enoxaparin (LOVENOX) injection 40 mg Start: 07/20/21 1300   Code Status:   Code Status: Full Code Family Communication: Husband  Disposition Plan: Status is: Inpatient  Remains inpatient appropriate because:IV treatments appropriate due to intensity of illness or inability to take PO and Inpatient level of care appropriate due to severity of illness  Dispo: The patient is from: Home              Anticipated d/c is to: SNF              Patient currently is medically stable to d/c.   Difficult to place patient No  Risk of unplanned readmission score: Unplanned Admission- Pilot do not use: 14.89   Objective: Blood pressure (!) 162/92, pulse 89, temperature 98.7 F (37.1 C), temperature source Oral, resp. rate 18, height 5\' 1"  (1.549 m), weight 77.1 kg, SpO2 94 %.  Examination: General appearance: Awake and alert.  Follows commands.  Underlying dementia appreciated Head: Normocephalic, without obvious abnormality, atraumatic Eyes:  EOMI Lungs: clear to auscultation bilaterally Heart: regular rate and rhythm and S1, S2 normal Abdomen: normal findings: bowel sounds normal and soft, non-tender Extremities:  trace LE edema Skin: mobility and turgor normal Neurologic: Follows commands, moves all 4 extremities.  No focal weakness  Consultants:    Procedures:    Data Reviewed: I have personally reviewed following labs and imaging studies No results found for this or  any previous visit (from the past 24 hour(s)).   Recent Results (from the past 240 hour(s))  Resp Panel by RT-PCR (Flu A&B, Covid) Nasopharyngeal Swab     Status: None   Collection Time: 07/20/21 12:07 AM   Specimen: Nasopharyngeal Swab; Nasopharyngeal(NP)  swabs in vial transport medium  Result Value Ref Range Status   SARS Coronavirus 2 by RT PCR NEGATIVE NEGATIVE Final    Comment: (NOTE) SARS-CoV-2 target nucleic acids are NOT DETECTED.  The SARS-CoV-2 RNA is generally detectable in upper respiratory specimens during the acute phase of infection. The lowest concentration of SARS-CoV-2 viral copies this assay can detect is 138 copies/mL. A negative result does not preclude SARS-Cov-2 infection and should not be used as the sole basis for treatment or other patient management decisions. A negative result may occur with  improper specimen collection/handling, submission of specimen other than nasopharyngeal swab, presence of viral mutation(s) within the areas targeted by this assay, and inadequate number of viral copies(<138 copies/mL). A negative result must be combined with clinical observations, patient history, and epidemiological information. The expected result is Negative.  Fact Sheet for Patients:  EntrepreneurPulse.com.au  Fact Sheet for Healthcare Providers:  IncredibleEmployment.be  This test is no t yet approved or cleared by the [place] FDA and  has been authorized for detection and/or diagnosis of SARS-CoV-2 by FDA under an Emergency Use Authorization (EUA). This EUA will remain  in effect (meaning this test can be used) for the duration of the COVID-19 declaration under Section 564(b)(1) of the Act, 21 U.S.C.section 360bbb-3(b)(1), unless the authorization is terminated  or revoked sooner.       Influenza A by PCR NEGATIVE NEGATIVE Final   Influenza B by PCR NEGATIVE NEGATIVE Final    Comment: (NOTE) The Xpert Xpress SARS-CoV-2/FLU/RSV plus assay is intended as an aid in the diagnosis of influenza from Nasopharyngeal swab specimens and should not be used as a sole basis for treatment. Nasal washings and aspirates are unacceptable for Xpert Xpress  SARS-CoV-2/FLU/RSV testing.  Fact Sheet for Patients: EntrepreneurPulse.com.au  Fact Sheet for Healthcare Providers: IncredibleEmployment.be  This test is not yet approved or cleared by the [place] FDA and has been authorized for detection and/or diagnosis of SARS-CoV-2 by FDA under an Emergency Use Authorization (EUA). This EUA will remain in effect (meaning this test can be used) for the duration of the COVID-19 declaration under Section 564(b)(1) of the Act, 21 U.S.C. section 360bbb-3(b)(1), unless the authorization is terminated or revoked.  Performed at [facility], [address], [place], [place] [address]      Radiology Studies: No results found. DG Chest Port 1 View  Final Result      Scheduled Meds:  amLODipine  10 mg Oral Daily   enoxaparin (LOVENOX) injection  40 mg Subcutaneous Daily   hydrALAZINE  37.5 mg Oral TID   irbesartan  300 mg Oral Daily   lamoTRIgine  100 mg Oral BID   magnesium oxide  400 mg Oral Daily   rosuvastatin  10 mg Oral Daily   sodium chloride flush  3 mL Intravenous Q12H   PRN Meds: acetaminophen **OR** acetaminophen, hydrALAZINE, labetalol Continuous Infusions:     LOS: 5 days    Dwyane Dee, MD [facility] 07/26/2021, 2:01 PM

## 2021-07-26 NOTE — Progress Notes (Signed)
Physical Therapy Treatment Patient Details Name: Sarah Cook MRN: [id number] DOB: [birth year] Today's Date: 07/26/2021   History of Present Illness 70 yo female with PMH dementia, hypertension, anxiety, urinary incontinence, rapid functional decline.  She presented to the ER with weakness, multiple falls at home, and ongoing poor mentation. Dx of hypokalemia, hypertension, progression of dementia.    PT Comments    Patient required increased assist for functional transfers this session. Patient required Mod+2 assist for sit<>stands and was unable to take small steps to ambulate around bed. Pt returned to bed and rolled to transfer OOB to recliner from opposite EOB (closer to chair). 2+ Mod assist needed to steady with rise and manual facilitation to direct turn to recliner. She will continue to benefit from skilled PT interventions to progress mobility at SNF level. Acute PT will continue to follow as able.    Recommendations for follow up therapy are one component of a multi-disciplinary discharge planning process, led by the attending physician.  Recommendations may be updated based on patient status, additional functional criteria and insurance authorization.  Follow Up Recommendations  SNF;Supervision/Assistance - 24 hour;Supervision for mobility/OOB     Equipment Recommendations  None recommended by PT    Recommendations for Other Services       Precautions / Restrictions Precautions Precautions: Fall Precaution Comments: h/o falls Restrictions Weight Bearing Restrictions: No     Mobility  Bed Mobility Overal bed mobility: Needs Assistance Bed Mobility: Supine to Sit;Sit to Supine;Rolling Rolling: Min assist   Supine to sit: HOB elevated;Mod assist Sit to supine: Mod assist   General bed mobility comments: pt requires repeated verbal/tactile cues to sequence all bed mobility. Mod assist to return to supine and min to roll for supine>sit at opposite side of bed. Mod  assist to bring bil LE's off EOB and to raise trunk up fully. Assist needed to scoot to EOB.    Transfers Overall transfer level: Needs assistance Equipment used: 2 person hand held assist Transfers: Sit to/from [facility] Sit to Stand: Mod assist;+2 physical assistance;+2 safety/equipment Stand pivot transfers: Mod assist;+2 physical assistance;+2 safety/equipment       General transfer comment: Pt required increased assist for power up and multimodal cues for hand placement to rise. 2+ assist required to complete rise and steady with small side steps to move BSC>bed>chair.  Ambulation/Gait             General Gait Details: pt unsafe to ambulate this date, increased weakness.   Stairs             Wheelchair Mobility    Modified Rankin (Stroke Patients Only)       Balance Overall balance assessment: Needs assistance Sitting-balance support: Feet supported Sitting balance-Leahy Scale: Fair     Standing balance support: During functional activity;Bilateral upper extremity supported Standing balance-Leahy Scale: Poor                              Cognition Arousal/Alertness: Awake/alert Behavior During Therapy: WFL for tasks assessed/performed Overall Cognitive Status: History of cognitive impairments - at baseline                                 General Comments: Dementia. Oriented to  self only and able to follow commands with repeated verbal and tactile cues.      Exercises  General Comments        Pertinent Vitals/Pain Pain Assessment: Faces Faces Pain Scale: No hurt Pain Intervention(s): Monitored during session    Home Living                      Prior Function            PT Goals (current goals can now be found in the care plan section) Acute Rehab PT Goals PT Goal Formulation: With family Time For Goal Achievement: 08/05/21 Potential to Achieve Goals: Fair Progress towards PT  goals: Progressing toward goals (limited)    Frequency    Min 2X/week      PT Plan Current plan remains appropriate    Co-evaluation              AM-PAC PT "6 Clicks" Mobility   Outcome Measure  Help needed turning from your back to your side while in a flat bed without using bedrails?: A Lot Help needed moving from lying on your back to sitting on the side of a flat bed without using bedrails?: A Lot Help needed moving to and from a bed to a chair (including a wheelchair)?: A Lot Help needed standing up from a chair using your arms (e.g., wheelchair or bedside chair)?: A Lot Help needed to walk in hospital room?: A Lot Help needed climbing 3-5 steps with a railing? : Total 6 Click Score: 11    End of Session Equipment Utilized During Treatment: Gait belt Activity Tolerance: Patient tolerated treatment well Patient left: in chair;with call bell/phone within reach;with chair alarm set Nurse Communication: Mobility status PT Visit Diagnosis: Unsteadiness on feet (R26.81);Difficulty in walking, not elsewhere classified (R26.2);History of falling (Z91.81)     Time: [id number] PT Time Calculation (min) (ACUTE ONLY): 28 min  Charges:  $Therapeutic Activity: 23-37 mins                     Verner Mould, DPT Acute Rehabilitation Services Office [phone] Pager [phone]    Jacques Navy 07/26/2021, 12:27 PM

## 2021-07-27 DIAGNOSIS — E7849 Other hyperlipidemia: Secondary | ICD-10-CM | POA: Diagnosis not present

## 2021-07-27 DIAGNOSIS — R531 Weakness: Secondary | ICD-10-CM | POA: Diagnosis not present

## 2021-07-27 DIAGNOSIS — R296 Repeated falls: Secondary | ICD-10-CM | POA: Diagnosis not present

## 2021-07-27 DIAGNOSIS — R4182 Altered mental status, unspecified: Secondary | ICD-10-CM | POA: Diagnosis not present

## 2021-07-27 DIAGNOSIS — G9389 Other specified disorders of brain: Secondary | ICD-10-CM

## 2021-07-27 DIAGNOSIS — F411 Generalized anxiety disorder: Secondary | ICD-10-CM | POA: Diagnosis not present

## 2021-07-27 DIAGNOSIS — E212 Other hyperparathyroidism: Secondary | ICD-10-CM | POA: Diagnosis not present

## 2021-07-27 DIAGNOSIS — R488 Other symbolic dysfunctions: Secondary | ICD-10-CM | POA: Diagnosis not present

## 2021-07-27 DIAGNOSIS — F0390 Unspecified dementia without behavioral disturbance: Secondary | ICD-10-CM | POA: Diagnosis not present

## 2021-07-27 DIAGNOSIS — R410 Disorientation, unspecified: Secondary | ICD-10-CM | POA: Diagnosis not present

## 2021-07-27 DIAGNOSIS — I10 Essential (primary) hypertension: Secondary | ICD-10-CM | POA: Diagnosis not present

## 2021-07-27 DIAGNOSIS — M6281 Muscle weakness (generalized): Secondary | ICD-10-CM | POA: Diagnosis not present

## 2021-07-27 DIAGNOSIS — Z7401 Bed confinement status: Secondary | ICD-10-CM | POA: Diagnosis not present

## 2021-07-27 MED ORDER — HYDRALAZINE HCL 25 MG PO TABS: 37.5000 mg | ORAL_TABLET | Freq: Three times a day (TID) | ORAL | Status: DC

## 2021-07-27 MED ORDER — AMLODIPINE BESYLATE 10 MG PO TABS: 10.0000 mg | ORAL_TABLET | Freq: Every day | ORAL | Status: DC

## 2021-07-27 NOTE — TOC Progression Note (Signed)
Transition of Care [facility]) - Progression Note    Patient Details  Name: Sarah Cook MRN: [id number] Date of Birth: 10-14-1951  [facility] of [facility]) CM/SW Contact  Purcell Mouton, RN Phone Number: 07/27/2021, 12:39 PM  Clinical Narrative:    Sarah Cook was called. Pt's husband was called and is aware of pt transferring to [facility].         Expected Discharge Plan and Services           Expected Discharge Date: 07/27/21                                     Social Determinants of Health (SDOH) Interventions    Readmission Risk Interventions No flowsheet data found.

## 2021-07-27 NOTE — TOC Progression Note (Signed)
Transition of Care [facility]) - Progression Note    Patient Details  Name: Sarah Cook MRN: [id number] Date of Birth: 1951-06-20  [facility]) CM/SW Contact  Purcell Mouton, RN Phone Number: 07/27/2021, 10:20 AM  Clinical Narrative:    Insurance Josem Kaufmann was started for pt to go to [facility].         Expected Discharge Plan and Services                                                 Social Determinants of Health (SDOH) Interventions    Readmission Risk Interventions No flowsheet data found.

## 2021-07-27 NOTE — Progress Notes (Signed)
Occupational Therapy Treatment Patient Details Name: Sarah Cook MRN: [id number] DOB: 1951-06-03 Today's Date: 07/27/2021   History of present illness 70 yo female with PMH dementia, hypertension, anxiety, urinary incontinence, rapid functional decline.  She presented to the ER with weakness, multiple falls at home, and ongoing poor mentation. Dx of hypokalemia, hypertension, progression of dementia.   OT comments  Patient needed set up and max multimodal cues to participate in self feeding on this date with nursing education provided to ensure patient has needed assist for these tasks. Nurse and nurse tech verbalized understanding. Patient demonstrated ability to participate in task with no spillage but needed to cues to continue to participate in each step of self feeding. Patient's discharge plan remains appropriate at this time. OT will continue to follow acutely.     Recommendations for follow up therapy are one component of a multi-disciplinary discharge planning process, led by the attending physician.  Recommendations may be updated based on patient status, additional functional criteria and insurance authorization.    Follow Up Recommendations  SNF    Equipment Recommendations  None recommended by OT    Recommendations for Other Services      Precautions / Restrictions Precautions Precautions: Fall Precaution Comments: h/o falls Restrictions Weight Bearing Restrictions: No       Mobility Bed Mobility Overal bed mobility: Needs Assistance Bed Mobility: Rolling Rolling: Min assist              Transfers                      Balance                                           ADL either performed or assessed with clinical judgement   ADL Overall ADL's : Needs assistance/impaired   Eating/Feeding Details (indicate cue type and reason): patient was noted to be shoulder in bed with max A for scooting in bed with patient attempting to  assist with BUE. patient needed sequencing cues for eating meal with no initation to participate in tasks. nursing was educated on patients need for assistance with self feeding tasks. NT and nurse verbalized understanding.                                         Vision       Perception     Praxis      Cognition Arousal/Alertness: Awake/alert Behavior During Therapy: WFL for tasks assessed/performed Overall Cognitive Status: History of cognitive impairments - at baseline                                 General Comments: Dementia. Oriented to  self only and able to follow commands with repeated verbal and tactile cues.        Exercises     Shoulder Instructions       General Comments      Pertinent Vitals/ Pain       Pain Assessment: No/denies pain  Home Living  Prior Functioning/Environment              Frequency  Min 2X/week        Progress Toward Goals  OT Goals(current goals can now be found in the care plan section)  Progress towards OT goals: Progressing toward goals  Acute Rehab OT Goals Patient Stated Goal: to get her moving better and safely (per OT note)  Plan Discharge plan remains appropriate    Co-evaluation                 AM-PAC OT "6 Clicks" Daily Activity     Outcome Measure   Help from another person eating meals?: A Little Help from another person taking care of personal grooming?: A Little Help from another person toileting, which includes using toliet, bedpan, or urinal?: A Lot Help from another person bathing (including washing, rinsing, drying)?: A Lot Help from another person to put on and taking off regular upper body clothing?: A Lot Help from another person to put on and taking off regular lower body clothing?: Total 6 Click Score: 13    End of Session    OT Visit Diagnosis: Unsteadiness on feet (R26.81);Other symptoms and  signs involving cognitive function   Activity Tolerance Patient tolerated treatment well   Patient Left in bed;with call bell/phone within reach;with bed alarm set   Nurse Communication Other (comment) (need for assistance with feeding)        Time: 2409-7353 OT Time Calculation (min): 17 min  Charges: OT General Charges $OT Visit: 1 Visit OT Treatments $Self Care/Home Management : 8-22 mins  Jackelyn Poling OTR/L, [place] Acute Rehabilitation Department Office# [phone] Pager# [phone]   [place] 07/27/2021, 10:05 AM

## 2021-07-27 NOTE — Discharge Summary (Signed)
Physician Discharge Summary  Sarah Cook MCN:[id number] DOB: 07/24/1951  PCP: Cari Caraway, MD  Admitted from: Home Discharged to: SNF  Admit date: 07/19/2021 Discharge date: 07/27/2021  Recommendations for Outpatient Follow-up:    Follow-up Information     MD at SNF Follow up.   Why: To be seen in 2 to 3 days with repeat labs (CBC, CMP, magnesium & phosphorus).  Recommend outpatient neurology follow-up.        Cari Caraway, MD. Schedule an appointment as soon as possible for a visit.   Specialty: Family Medicine Why: To be seen upon discharge from SNF Contact information: [place] [address] [phone]         Marcial Pacas, MD. Schedule an appointment as soon as possible for a visit in 2 week(s).   Specialty: Neurology Contact information: [address] [place]  [address] [place]: None    Equipment/Devices: TBD at SNF    Discharge Condition: Improved and stable.   Code Status: Full Code Diet recommendation:  Discharge Diet Orders (From admission, onward)     Start     Ordered   07/27/21 0000  Diet - low sodium heart healthy        07/27/21 1205             Discharge Diagnoses:  Principal Problem:   Generalized muscle weakness Active Problems:   Hypokalemia   Encephalomalacia   Urinary incontinence   Hypophosphatemia   Hypertensive urgency   Dementia without behavioral disturbance ([place])   Brief Summary: Sarah Cook is a 70 yo female with PMH dementia, hypertension, anxiety, urinary incontinence, rapid functional decline.  She presented to the ER with weakness, multiple falls at home, and ongoing poor mentation. She was also recently evaluated by neurology on 06/23/2021.  She had similar symptoms at that time.  She was placed empirically on lamotrigine however there was no witnessed seizure activity at home but she is noted to be at risk for partial seizure. Etiology  of her symptoms is suspected to be due to her underlying dementia with cerebral atrophy/encephalomalacia.  Assessment & Plan: * Generalized muscle weakness - Suspect this is progressive, multifactorial and in large part due to her underlying dementia - Has had multiple falls at home - PT/OT consulted: SNF recommended  -As per OT at bedside today, patient is a 2 person assist.  Encephalomalacia - History of worsening mentation and disorientation.  Has undergone recent MRI brain on 06/28/2021 showing underlying atrophy and chronic small vessel disease.  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  Per neurology, at risk for partial seizure - Has been started on lamotrigine outpatient, continued on same dose - Outpatient follow-up with neurology.  Ambulatory referral sent.   Hypokalemia - Likely due to poor oral intake at home.  No overt report of nausea/vomiting or diarrhea - Potassium 3.1 on 9/17, received potassium chloride 40 meq after that.  Magnesium 1.8. - Follow CMP and magnesium as outpatient.   Hypertensive urgency - BP escalating after admission; requiring IV labetalol for help with control -Multiple medication dose adjustments done.  Amlodipine increased from 2.5 Mg daily to 10 Mg daily, hydralazine increased to 37.5 mg 3 times daily, home dose of ARB continued.  Unclear as to why she was on nebivolol, no indication documented for as needed use-has been discontinued at discharge and  defer to MD at SNF or PCP regarding need to resume. -BP better controlled now.  Monitor blood pressures closely and adjust meds as needed.   Dementia without behavioral disturbance ([place]) - see encephalomalacia   Hypophosphatemia -Was replaced on 9/13.  Outpatient follow-up.   Urinary incontinence - Also chronic issue at home - Continue adult briefs; pure wick okay too   Consultations: None  Procedures: None   Discharge Instructions  Discharge  Instructions     Ambulatory referral to Neurology   Complete by: As directed    An appointment is requested in approximately: 2 weeks   Call MD for:  difficulty breathing, headache or visual disturbances   Complete by: As directed    Call MD for:  extreme fatigue   Complete by: As directed    Call MD for:  persistant dizziness or light-headedness   Complete by: As directed    Call MD for:  persistant nausea and vomiting   Complete by: As directed    Call MD for:  severe uncontrolled pain   Complete by: As directed    Call MD for:  temperature >100.4   Complete by: As directed    Diet - low sodium heart healthy   Complete by: As directed    Increase activity slowly   Complete by: As directed         Medication List     STOP taking these medications    Nebivolol HCl 20 MG Tabs       TAKE these medications    acetaminophen 650 MG CR tablet Commonly known as: TYLENOL Take 1,300 mg by mouth in the morning and at bedtime.   amLODipine 10 MG tablet Commonly known as: NORVASC Take 1 tablet (10 mg total) by mouth daily. Start taking on: July 28, 2021 What changed:  medication strength how much to take   CINNAMON PO Take 1 tablet by mouth daily.   CO Q 10 PO Take 1 tablet by mouth daily.   docusate sodium 100 MG capsule Commonly known as: COLACE Take 100 mg by mouth daily.   hydrALAZINE 25 MG tablet Commonly known as: APRESOLINE Take 1.5 tablets (37.5 mg total) by mouth 3 (three) times daily. Start taking on: July 28, 2021 What changed:  how much to take when to take this   irbesartan 300 MG tablet Commonly known as: AVAPRO Take 300 mg by mouth daily.   lamoTRIgine 100 MG tablet Commonly known as: LaMICtal Take 1 tablet (100 mg total) by mouth 2 (two) times daily. What changed: Another medication with the same name was removed. Continue taking this medication, and follow the directions you see here.   magnesium oxide 400 MG tablet Commonly  known as: MAG-OX Take 400 mg by mouth daily.   OVER THE COUNTER MEDICATION Take 2 capsules by mouth at bedtime. Tumeric Black pepper 5mg  tab take 1 tab by mouth daily   PROBIOTIC PO Take 1 capsule by mouth daily. Takes a power   rosuvastatin 10 MG tablet Commonly known as: CRESTOR Take 10 mg by mouth daily.   VITAMIN C PO Take 1 tablet by mouth daily.   Vitamin D3 50 MCG (2000 UT) Tabs Take 2,000 Units by mouth daily.       Allergies  Allergen Reactions   Anesthetics, Ester Nausea And Vomiting   Codeine Nausea Only   Tape Other (See Comments)    Skin blisters   Hydrochlorothiazide Other (See Comments)   Onion  Does not like   Latex Itching and Rash    Irritates skin      Procedures/Studies:  DG Chest Port 1 View  Result Date: 07/20/2021 CLINICAL DATA:  Chest pain. EXAM: PORTABLE CHEST 1 VIEW COMPARISON:  January 27, 2016 FINDINGS: Mild, diffuse, chronic appearing increased lung markings are seen. There is no evidence of acute infiltrate, pleural effusion or pneumothorax. The heart size and mediastinal contours are within normal limits. A large hiatal hernia is seen. Radiopaque surgical clips are noted along the lateral aspect of the right hemithorax. The visualized skeletal structures are unremarkable. IMPRESSION: 1. No evidence of acute cardiopulmonary disease. 2. Large hiatal hernia. Electronically Signed   By: Virgina Norfolk M.D.   On: 07/20/2021 01:12      Subjective: Patient seen along with OT at bedside.  Alert and oriented only to self.  Sitting up in bed eating a blueberry muffin and drinking coffee.  Following simple instructions.  Discharge Exam:  Vitals:   07/26/21 1619 07/26/21 2037 07/27/21 0505 07/27/21 0927  BP: (!) 155/60 (!) 157/73 (!) 143/64 (!) 142/61  Pulse: (!) 108 98 81 98  Resp: 20 18 20 18   Temp: 98.2 F (36.8 C) 98.9 F (37.2 C) 98 F (36.7 C) 98.1 F (36.7 C)  TempSrc: Oral Axillary Axillary Axillary  SpO2: 96% 97% 97% 96%   Weight:      Height:        General: Elderly female, moderately built and nourished sitting up comfortably in bed without distress. Cardiovascular: S1 & S2 heard, RRR, S1/S2 +. No murmurs, rubs, gallops or clicks. No JVD or pedal edema.  Telemetry personally reviewed: Sinus rhythm. Respiratory: Clear to auscultation without wheezing, rhonchi or crackles. No increased work of breathing. Abdominal:  Non distended, non tender & soft. No organomegaly or masses appreciated. Normal bowel sounds heard. CNS: Alert and oriented only to self. No focal deficits.  Follows simple instructions. Extremities: no edema, no cyanosis    The results of significant diagnostics from this hospitalization (including imaging, microbiology, ancillary and laboratory) are listed below for reference.     Microbiology: Recent Results (from the past 240 hour(s))  Resp Panel by RT-PCR (Flu A&B, Covid) Nasopharyngeal Swab     Status: None   Collection Time: 07/20/21 12:07 AM   Specimen: Nasopharyngeal Swab; Nasopharyngeal(NP) swabs in vial transport medium  Result Value Ref Range Status   SARS Coronavirus 2 by RT PCR NEGATIVE NEGATIVE Final    Comment: (NOTE) SARS-CoV-2 target nucleic acids are NOT DETECTED.  The SARS-CoV-2 RNA is generally detectable in upper respiratory specimens during the acute phase of infection. The lowest concentration of SARS-CoV-2 viral copies this assay can detect is 138 copies/mL. A negative result does not preclude SARS-Cov-2 infection and should not be used as the sole basis for treatment or other patient management decisions. A negative result may occur with  improper specimen collection/handling, submission of specimen other than nasopharyngeal swab, presence of viral mutation(s) within the areas targeted by this assay, and inadequate number of viral copies(<138 copies/mL). A negative result must be combined with clinical observations, patient history, and  epidemiological information. The expected result is Negative.  Fact Sheet for Patients:  EntrepreneurPulse.com.au  Fact Sheet for Healthcare Providers:  IncredibleEmployment.be  This test is no t yet approved or cleared by the [place] FDA and  has been authorized for detection and/or diagnosis of SARS-CoV-2 by FDA under an Emergency Use Authorization (EUA). This EUA will remain  in effect (meaning  this test can be used) for the duration of the COVID-19 declaration under Section 564(b)(1) of the Act, 21 U.S.C.section 360bbb-3(b)(1), unless the authorization is terminated  or revoked sooner.       Influenza A by PCR NEGATIVE NEGATIVE Final   Influenza B by PCR NEGATIVE NEGATIVE Final    Comment: (NOTE) The Xpert Xpress SARS-CoV-2/FLU/RSV plus assay is intended as an aid in the diagnosis of influenza from Nasopharyngeal swab specimens and should not be used as a sole basis for treatment. Nasal washings and aspirates are unacceptable for Xpert Xpress SARS-CoV-2/FLU/RSV testing.  Fact Sheet for Patients: EntrepreneurPulse.com.au  Fact Sheet for Healthcare Providers: IncredibleEmployment.be  This test is not yet approved or cleared by the [place] FDA and has been authorized for detection and/or diagnosis of SARS-CoV-2 by FDA under an Emergency Use Authorization (EUA). This EUA will remain in effect (meaning this test can be used) for the duration of the COVID-19 declaration under Section 564(b)(1) of the Act, 21 U.S.C. section 360bbb-3(b)(1), unless the authorization is terminated or revoked.  Performed at [facility], [address], [place], [place] [address]   Resp Panel by RT-PCR (Flu A&B, Covid) Nasopharyngeal Swab     Status: None   Collection Time: 07/26/21  5:34 PM   Specimen: Nasopharyngeal Swab; Nasopharyngeal(NP) swabs in vial transport medium  Result Value Ref  Range Status   SARS Coronavirus 2 by RT PCR NEGATIVE NEGATIVE Final    Comment: (NOTE) SARS-CoV-2 target nucleic acids are NOT DETECTED.  The SARS-CoV-2 RNA is generally detectable in upper respiratory specimens during the acute phase of infection. The lowest concentration of SARS-CoV-2 viral copies this assay can detect is 138 copies/mL. A negative result does not preclude SARS-Cov-2 infection and should not be used as the sole basis for treatment or other patient management decisions. A negative result may occur with  improper specimen collection/handling, submission of specimen other than nasopharyngeal swab, presence of viral mutation(s) within the areas targeted by this assay, and inadequate number of viral copies(<138 copies/mL). A negative result must be combined with clinical observations, patient history, and epidemiological information. The expected result is Negative.  Fact Sheet for Patients:  EntrepreneurPulse.com.au  Fact Sheet for Healthcare Providers:  IncredibleEmployment.be  This test is no t yet approved or cleared by the [place] FDA and  has been authorized for detection and/or diagnosis of SARS-CoV-2 by FDA under an Emergency Use Authorization (EUA). This EUA will remain  in effect (meaning this test can be used) for the duration of the COVID-19 declaration under Section 564(b)(1) of the Act, 21 U.S.C.section 360bbb-3(b)(1), unless the authorization is terminated  or revoked sooner.       Influenza A by PCR NEGATIVE NEGATIVE Final   Influenza B by PCR NEGATIVE NEGATIVE Final    Comment: (NOTE) The Xpert Xpress SARS-CoV-2/FLU/RSV plus assay is intended as an aid in the diagnosis of influenza from Nasopharyngeal swab specimens and should not be used as a sole basis for treatment. Nasal washings and aspirates are unacceptable for Xpert Xpress SARS-CoV-2/FLU/RSV testing.  Fact Sheet for  Patients: EntrepreneurPulse.com.au  Fact Sheet for Healthcare Providers: IncredibleEmployment.be  This test is not yet approved or cleared by the [place] FDA and has been authorized for detection and/or diagnosis of SARS-CoV-2 by FDA under an Emergency Use Authorization (EUA). This EUA will remain in effect (meaning this test can be used) for the duration of the COVID-19 declaration under Section 564(b)(1) of the Act, 21 U.S.C. section 360bbb-3(b)(1), unless  the authorization is terminated or revoked.  Performed at [facility], [place] [address] [place], [place] [address]      Labs: CBC: Recent Labs  Lab 07/20/21 1249 07/21/21 0351  WBC 6.5 7.6  NEUTROABS 4.5 5.3  HGB 12.4 12.3  HCT 34.2* 34.8*  MCV 89.8 90.6  PLT 218 449    Basic Metabolic Panel: Recent Labs  Lab 07/20/21 1249 07/21/21 0351 07/22/21 0359 07/23/21 0347 07/24/21 0402  NA 144 144 141 140 143  K 2.9* 2.6* 3.1* 3.7 3.1*  CL 106 108 109 112* 111  CO2 27 26 23 23 25   GLUCOSE 108* 111* 110* 108* 110*  BUN 14 7* 8 10 8   CREATININE 0.87 0.72 0.76 0.72 0.56  CALCIUM 9.9 9.7 9.6 9.2 9.9  MG 2.1 1.7 2.3 2.0 1.8  PHOS 1.9*  --   --   --   --     Liver Function Tests: Recent Labs  Lab 07/20/21 1249  AST 26  ALT 16  ALKPHOS 53  BILITOT 1.8*  PROT 6.8  ALBUMIN 4.3     Urinalysis    Component Value Date/Time   COLORURINE YELLOW 07/19/2021 2316   APPEARANCEUR HAZY (A) 07/19/2021 2316   LABSPEC 1.025 07/19/2021 2316   PHURINE 6.5 07/19/2021 2316   GLUCOSEU NEGATIVE 07/19/2021 2316   HGBUR NEGATIVE 07/19/2021 2316   BILIRUBINUR NEGATIVE 07/19/2021 2316   KETONESUR NEGATIVE 07/19/2021 2316   PROTEINUR TRACE (A) 07/19/2021 2316   UROBILINOGEN 0.2 02/20/2009 1445   NITRITE NEGATIVE 07/19/2021 2316   LEUKOCYTESUR NEGATIVE 07/19/2021 2316    I called and discussed with patient's spouse via phone.  Updated care and answered all  questions.  Advised him that patient is being discharged to [facility] today.  He was appreciative of the call.  Time coordinating discharge: 25 minutes  SIGNED:  Vernell Leep, MD, [place], [facility]. [facility]  To contact the attending provider between 7A-7P or the covering provider during after hours 7P-7A, please log into the web site [email or web address] and access using universal [place] password for that web site. If you do not have the password, please call the hospital operator.

## 2021-07-27 NOTE — Discharge Instructions (Signed)

## 2021-07-27 NOTE — Progress Notes (Signed)
Called report to [facility]. Pt still waiting for transport.

## 2021-07-27 NOTE — Plan of Care (Signed)
  Problem: Clinical Measurements: Goal: Diagnostic test results will improve Outcome: Adequate for Discharge   Problem: Clinical Measurements: Goal: Respiratory complications will improve Outcome: Adequate for Discharge   Problem: Activity: Goal: Risk for activity intolerance will decrease Outcome: Adequate for Discharge   Problem: Nutrition: Goal: Adequate nutrition will be maintained Outcome: Adequate for Discharge   Problem: Elimination: Goal: Will not experience complications related to bowel motility Outcome: Adequate for Discharge   Problem: Clinical Measurements: Goal: Diagnostic test results will improve Outcome: Adequate for Discharge

## 2021-07-27 NOTE — TOC Progression Note (Addendum)
Transition of Care [facility]) - Progression Note    Patient Details  Name: Sarah Cook MRN: [id number] Date of Birth: 05-21-1951  Transition of Care [facility]) CM/SW Contact  Purcell Mouton, RN Phone Number: 07/27/2021, 12:19 PM  Clinical Narrative:     Pt is Fully vaccinated with [place] for COVID: 12/02/19, 12/23/19, 08/08/20, 05/19/21.       Expected Discharge Plan and Services           Expected Discharge Date: 07/27/21                                     Social Determinants of Health (SDOH) Interventions    Readmission Risk Interventions No flowsheet data found.

## 2021-07-27 NOTE — Progress Notes (Signed)
Attempted to call [facility] for report but phone just keeps ringing with no answer.

## 2021-07-29 DIAGNOSIS — F0390 Unspecified dementia without behavioral disturbance: Secondary | ICD-10-CM | POA: Diagnosis not present

## 2021-07-29 DIAGNOSIS — R531 Weakness: Secondary | ICD-10-CM | POA: Diagnosis not present

## 2021-07-29 DIAGNOSIS — R296 Repeated falls: Secondary | ICD-10-CM | POA: Diagnosis not present

## 2021-08-09 DIAGNOSIS — R296 Repeated falls: Secondary | ICD-10-CM | POA: Diagnosis not present

## 2021-08-09 DIAGNOSIS — M6281 Muscle weakness (generalized): Secondary | ICD-10-CM | POA: Diagnosis not present

## 2021-08-09 DIAGNOSIS — F0390 Unspecified dementia without behavioral disturbance: Secondary | ICD-10-CM | POA: Diagnosis not present

## 2021-08-09 DIAGNOSIS — R410 Disorientation, unspecified: Secondary | ICD-10-CM | POA: Diagnosis not present

## 2021-08-11 DIAGNOSIS — F0390 Unspecified dementia without behavioral disturbance: Secondary | ICD-10-CM | POA: Diagnosis not present

## 2021-08-11 DIAGNOSIS — I10 Essential (primary) hypertension: Secondary | ICD-10-CM | POA: Diagnosis not present

## 2021-08-11 DIAGNOSIS — M6281 Muscle weakness (generalized): Secondary | ICD-10-CM | POA: Diagnosis not present

## 2021-08-11 DIAGNOSIS — R296 Repeated falls: Secondary | ICD-10-CM | POA: Diagnosis not present

## 2021-08-11 DIAGNOSIS — G9389 Other specified disorders of brain: Secondary | ICD-10-CM | POA: Diagnosis not present

## 2021-08-11 DIAGNOSIS — F411 Generalized anxiety disorder: Secondary | ICD-10-CM | POA: Diagnosis not present

## 2021-08-11 DIAGNOSIS — R410 Disorientation, unspecified: Secondary | ICD-10-CM | POA: Diagnosis not present

## 2021-08-14 DIAGNOSIS — G9389 Other specified disorders of brain: Secondary | ICD-10-CM | POA: Diagnosis not present

## 2021-08-14 DIAGNOSIS — H9191 Unspecified hearing loss, right ear: Secondary | ICD-10-CM | POA: Diagnosis not present

## 2021-08-14 DIAGNOSIS — E876 Hypokalemia: Secondary | ICD-10-CM | POA: Diagnosis not present

## 2021-08-14 DIAGNOSIS — F0393 Unspecified dementia, unspecified severity, with mood disturbance: Secondary | ICD-10-CM | POA: Diagnosis not present

## 2021-08-14 DIAGNOSIS — I110 Hypertensive heart disease with heart failure: Secondary | ICD-10-CM | POA: Diagnosis not present

## 2021-08-14 DIAGNOSIS — E785 Hyperlipidemia, unspecified: Secondary | ICD-10-CM | POA: Diagnosis not present

## 2021-08-14 DIAGNOSIS — F0394 Unspecified dementia, unspecified severity, with anxiety: Secondary | ICD-10-CM | POA: Diagnosis not present

## 2021-08-14 DIAGNOSIS — E079 Disorder of thyroid, unspecified: Secondary | ICD-10-CM | POA: Diagnosis not present

## 2021-08-14 DIAGNOSIS — M810 Age-related osteoporosis without current pathological fracture: Secondary | ICD-10-CM | POA: Diagnosis not present

## 2021-08-14 DIAGNOSIS — I509 Heart failure, unspecified: Secondary | ICD-10-CM | POA: Diagnosis not present

## 2021-08-16 DIAGNOSIS — G9389 Other specified disorders of brain: Secondary | ICD-10-CM | POA: Diagnosis not present

## 2021-08-16 DIAGNOSIS — H9191 Unspecified hearing loss, right ear: Secondary | ICD-10-CM | POA: Diagnosis not present

## 2021-08-16 DIAGNOSIS — F0394 Unspecified dementia, unspecified severity, with anxiety: Secondary | ICD-10-CM | POA: Diagnosis not present

## 2021-08-16 DIAGNOSIS — I110 Hypertensive heart disease with heart failure: Secondary | ICD-10-CM | POA: Diagnosis not present

## 2021-08-16 DIAGNOSIS — I509 Heart failure, unspecified: Secondary | ICD-10-CM | POA: Diagnosis not present

## 2021-08-16 DIAGNOSIS — E079 Disorder of thyroid, unspecified: Secondary | ICD-10-CM | POA: Diagnosis not present

## 2021-08-16 DIAGNOSIS — M810 Age-related osteoporosis without current pathological fracture: Secondary | ICD-10-CM | POA: Diagnosis not present

## 2021-08-16 DIAGNOSIS — E876 Hypokalemia: Secondary | ICD-10-CM | POA: Diagnosis not present

## 2021-08-16 DIAGNOSIS — F0393 Unspecified dementia, unspecified severity, with mood disturbance: Secondary | ICD-10-CM | POA: Diagnosis not present

## 2021-08-17 DIAGNOSIS — I509 Heart failure, unspecified: Secondary | ICD-10-CM | POA: Diagnosis not present

## 2021-08-17 DIAGNOSIS — H9191 Unspecified hearing loss, right ear: Secondary | ICD-10-CM | POA: Diagnosis not present

## 2021-08-17 DIAGNOSIS — M810 Age-related osteoporosis without current pathological fracture: Secondary | ICD-10-CM | POA: Diagnosis not present

## 2021-08-17 DIAGNOSIS — E876 Hypokalemia: Secondary | ICD-10-CM | POA: Diagnosis not present

## 2021-08-17 DIAGNOSIS — E079 Disorder of thyroid, unspecified: Secondary | ICD-10-CM | POA: Diagnosis not present

## 2021-08-17 DIAGNOSIS — I110 Hypertensive heart disease with heart failure: Secondary | ICD-10-CM | POA: Diagnosis not present

## 2021-08-17 DIAGNOSIS — F0393 Unspecified dementia, unspecified severity, with mood disturbance: Secondary | ICD-10-CM | POA: Diagnosis not present

## 2021-08-17 DIAGNOSIS — F0394 Unspecified dementia, unspecified severity, with anxiety: Secondary | ICD-10-CM | POA: Diagnosis not present

## 2021-08-17 DIAGNOSIS — G9389 Other specified disorders of brain: Secondary | ICD-10-CM | POA: Diagnosis not present

## 2021-08-21 DIAGNOSIS — H9191 Unspecified hearing loss, right ear: Secondary | ICD-10-CM | POA: Diagnosis not present

## 2021-08-21 DIAGNOSIS — F0393 Unspecified dementia, unspecified severity, with mood disturbance: Secondary | ICD-10-CM | POA: Diagnosis not present

## 2021-08-21 DIAGNOSIS — F0394 Unspecified dementia, unspecified severity, with anxiety: Secondary | ICD-10-CM | POA: Diagnosis not present

## 2021-08-21 DIAGNOSIS — M810 Age-related osteoporosis without current pathological fracture: Secondary | ICD-10-CM | POA: Diagnosis not present

## 2021-08-21 DIAGNOSIS — G9389 Other specified disorders of brain: Secondary | ICD-10-CM | POA: Diagnosis not present

## 2021-08-21 DIAGNOSIS — E876 Hypokalemia: Secondary | ICD-10-CM | POA: Diagnosis not present

## 2021-08-21 DIAGNOSIS — I110 Hypertensive heart disease with heart failure: Secondary | ICD-10-CM | POA: Diagnosis not present

## 2021-08-21 DIAGNOSIS — I509 Heart failure, unspecified: Secondary | ICD-10-CM | POA: Diagnosis not present

## 2021-08-21 DIAGNOSIS — E079 Disorder of thyroid, unspecified: Secondary | ICD-10-CM | POA: Diagnosis not present

## 2021-08-23 DIAGNOSIS — E079 Disorder of thyroid, unspecified: Secondary | ICD-10-CM | POA: Diagnosis not present

## 2021-08-23 DIAGNOSIS — M810 Age-related osteoporosis without current pathological fracture: Secondary | ICD-10-CM | POA: Diagnosis not present

## 2021-08-23 DIAGNOSIS — G9389 Other specified disorders of brain: Secondary | ICD-10-CM | POA: Diagnosis not present

## 2021-08-23 DIAGNOSIS — F0393 Unspecified dementia, unspecified severity, with mood disturbance: Secondary | ICD-10-CM | POA: Diagnosis not present

## 2021-08-23 DIAGNOSIS — E876 Hypokalemia: Secondary | ICD-10-CM | POA: Diagnosis not present

## 2021-08-23 DIAGNOSIS — I110 Hypertensive heart disease with heart failure: Secondary | ICD-10-CM | POA: Diagnosis not present

## 2021-08-23 DIAGNOSIS — F0394 Unspecified dementia, unspecified severity, with anxiety: Secondary | ICD-10-CM | POA: Diagnosis not present

## 2021-08-23 DIAGNOSIS — I509 Heart failure, unspecified: Secondary | ICD-10-CM | POA: Diagnosis not present

## 2021-08-23 DIAGNOSIS — H9191 Unspecified hearing loss, right ear: Secondary | ICD-10-CM | POA: Diagnosis not present

## 2021-08-25 DIAGNOSIS — F0393 Unspecified dementia, unspecified severity, with mood disturbance: Secondary | ICD-10-CM | POA: Diagnosis not present

## 2021-08-25 DIAGNOSIS — F0394 Unspecified dementia, unspecified severity, with anxiety: Secondary | ICD-10-CM | POA: Diagnosis not present

## 2021-08-25 DIAGNOSIS — I509 Heart failure, unspecified: Secondary | ICD-10-CM | POA: Diagnosis not present

## 2021-08-25 DIAGNOSIS — E079 Disorder of thyroid, unspecified: Secondary | ICD-10-CM | POA: Diagnosis not present

## 2021-08-25 DIAGNOSIS — I110 Hypertensive heart disease with heart failure: Secondary | ICD-10-CM | POA: Diagnosis not present

## 2021-08-25 DIAGNOSIS — M810 Age-related osteoporosis without current pathological fracture: Secondary | ICD-10-CM | POA: Diagnosis not present

## 2021-08-25 DIAGNOSIS — E876 Hypokalemia: Secondary | ICD-10-CM | POA: Diagnosis not present

## 2021-08-25 DIAGNOSIS — H9191 Unspecified hearing loss, right ear: Secondary | ICD-10-CM | POA: Diagnosis not present

## 2021-08-25 DIAGNOSIS — G9389 Other specified disorders of brain: Secondary | ICD-10-CM | POA: Diagnosis not present

## 2021-08-26 DIAGNOSIS — E079 Disorder of thyroid, unspecified: Secondary | ICD-10-CM | POA: Diagnosis not present

## 2021-08-26 DIAGNOSIS — M810 Age-related osteoporosis without current pathological fracture: Secondary | ICD-10-CM | POA: Diagnosis not present

## 2021-08-26 DIAGNOSIS — G9389 Other specified disorders of brain: Secondary | ICD-10-CM | POA: Diagnosis not present

## 2021-08-26 DIAGNOSIS — I509 Heart failure, unspecified: Secondary | ICD-10-CM | POA: Diagnosis not present

## 2021-08-26 DIAGNOSIS — I110 Hypertensive heart disease with heart failure: Secondary | ICD-10-CM | POA: Diagnosis not present

## 2021-08-26 DIAGNOSIS — F0393 Unspecified dementia, unspecified severity, with mood disturbance: Secondary | ICD-10-CM | POA: Diagnosis not present

## 2021-08-26 DIAGNOSIS — H9191 Unspecified hearing loss, right ear: Secondary | ICD-10-CM | POA: Diagnosis not present

## 2021-08-26 DIAGNOSIS — F0394 Unspecified dementia, unspecified severity, with anxiety: Secondary | ICD-10-CM | POA: Diagnosis not present

## 2021-08-26 DIAGNOSIS — E876 Hypokalemia: Secondary | ICD-10-CM | POA: Diagnosis not present

## 2021-08-27 DIAGNOSIS — H9191 Unspecified hearing loss, right ear: Secondary | ICD-10-CM | POA: Diagnosis not present

## 2021-08-27 DIAGNOSIS — E079 Disorder of thyroid, unspecified: Secondary | ICD-10-CM | POA: Diagnosis not present

## 2021-08-27 DIAGNOSIS — I509 Heart failure, unspecified: Secondary | ICD-10-CM | POA: Diagnosis not present

## 2021-08-27 DIAGNOSIS — F0393 Unspecified dementia, unspecified severity, with mood disturbance: Secondary | ICD-10-CM | POA: Diagnosis not present

## 2021-08-27 DIAGNOSIS — M810 Age-related osteoporosis without current pathological fracture: Secondary | ICD-10-CM | POA: Diagnosis not present

## 2021-08-27 DIAGNOSIS — I110 Hypertensive heart disease with heart failure: Secondary | ICD-10-CM | POA: Diagnosis not present

## 2021-08-27 DIAGNOSIS — E876 Hypokalemia: Secondary | ICD-10-CM | POA: Diagnosis not present

## 2021-08-27 DIAGNOSIS — F0394 Unspecified dementia, unspecified severity, with anxiety: Secondary | ICD-10-CM | POA: Diagnosis not present

## 2021-08-27 DIAGNOSIS — G9389 Other specified disorders of brain: Secondary | ICD-10-CM | POA: Diagnosis not present

## 2021-08-30 DIAGNOSIS — M810 Age-related osteoporosis without current pathological fracture: Secondary | ICD-10-CM | POA: Diagnosis not present

## 2021-08-30 DIAGNOSIS — E079 Disorder of thyroid, unspecified: Secondary | ICD-10-CM | POA: Diagnosis not present

## 2021-08-30 DIAGNOSIS — F0393 Unspecified dementia, unspecified severity, with mood disturbance: Secondary | ICD-10-CM | POA: Diagnosis not present

## 2021-08-30 DIAGNOSIS — H9191 Unspecified hearing loss, right ear: Secondary | ICD-10-CM | POA: Diagnosis not present

## 2021-08-30 DIAGNOSIS — F0394 Unspecified dementia, unspecified severity, with anxiety: Secondary | ICD-10-CM | POA: Diagnosis not present

## 2021-08-30 DIAGNOSIS — I509 Heart failure, unspecified: Secondary | ICD-10-CM | POA: Diagnosis not present

## 2021-08-30 DIAGNOSIS — G9389 Other specified disorders of brain: Secondary | ICD-10-CM | POA: Diagnosis not present

## 2021-08-30 DIAGNOSIS — E876 Hypokalemia: Secondary | ICD-10-CM | POA: Diagnosis not present

## 2021-08-30 DIAGNOSIS — I110 Hypertensive heart disease with heart failure: Secondary | ICD-10-CM | POA: Diagnosis not present

## 2021-08-31 DIAGNOSIS — Z1231 Encounter for screening mammogram for malignant neoplasm of breast: Secondary | ICD-10-CM | POA: Diagnosis not present

## 2021-09-01 DIAGNOSIS — M810 Age-related osteoporosis without current pathological fracture: Secondary | ICD-10-CM | POA: Diagnosis not present

## 2021-09-01 DIAGNOSIS — F0394 Unspecified dementia, unspecified severity, with anxiety: Secondary | ICD-10-CM | POA: Diagnosis not present

## 2021-09-01 DIAGNOSIS — G9389 Other specified disorders of brain: Secondary | ICD-10-CM | POA: Diagnosis not present

## 2021-09-01 DIAGNOSIS — F0393 Unspecified dementia, unspecified severity, with mood disturbance: Secondary | ICD-10-CM | POA: Diagnosis not present

## 2021-09-01 DIAGNOSIS — E876 Hypokalemia: Secondary | ICD-10-CM | POA: Diagnosis not present

## 2021-09-01 DIAGNOSIS — H9191 Unspecified hearing loss, right ear: Secondary | ICD-10-CM | POA: Diagnosis not present

## 2021-09-01 DIAGNOSIS — E079 Disorder of thyroid, unspecified: Secondary | ICD-10-CM | POA: Diagnosis not present

## 2021-09-01 DIAGNOSIS — I110 Hypertensive heart disease with heart failure: Secondary | ICD-10-CM | POA: Diagnosis not present

## 2021-09-01 DIAGNOSIS — I509 Heart failure, unspecified: Secondary | ICD-10-CM | POA: Diagnosis not present

## 2021-09-02 DIAGNOSIS — I110 Hypertensive heart disease with heart failure: Secondary | ICD-10-CM | POA: Diagnosis not present

## 2021-09-02 DIAGNOSIS — G9389 Other specified disorders of brain: Secondary | ICD-10-CM | POA: Diagnosis not present

## 2021-09-02 DIAGNOSIS — M810 Age-related osteoporosis without current pathological fracture: Secondary | ICD-10-CM | POA: Diagnosis not present

## 2021-09-02 DIAGNOSIS — E876 Hypokalemia: Secondary | ICD-10-CM | POA: Diagnosis not present

## 2021-09-02 DIAGNOSIS — I509 Heart failure, unspecified: Secondary | ICD-10-CM | POA: Diagnosis not present

## 2021-09-02 DIAGNOSIS — F0394 Unspecified dementia, unspecified severity, with anxiety: Secondary | ICD-10-CM | POA: Diagnosis not present

## 2021-09-02 DIAGNOSIS — H9191 Unspecified hearing loss, right ear: Secondary | ICD-10-CM | POA: Diagnosis not present

## 2021-09-02 DIAGNOSIS — E079 Disorder of thyroid, unspecified: Secondary | ICD-10-CM | POA: Diagnosis not present

## 2021-09-02 DIAGNOSIS — F0393 Unspecified dementia, unspecified severity, with mood disturbance: Secondary | ICD-10-CM | POA: Diagnosis not present

## 2021-09-03 DIAGNOSIS — M810 Age-related osteoporosis without current pathological fracture: Secondary | ICD-10-CM | POA: Diagnosis not present

## 2021-09-03 DIAGNOSIS — G9389 Other specified disorders of brain: Secondary | ICD-10-CM | POA: Diagnosis not present

## 2021-09-03 DIAGNOSIS — I110 Hypertensive heart disease with heart failure: Secondary | ICD-10-CM | POA: Diagnosis not present

## 2021-09-03 DIAGNOSIS — H9191 Unspecified hearing loss, right ear: Secondary | ICD-10-CM | POA: Diagnosis not present

## 2021-09-03 DIAGNOSIS — E079 Disorder of thyroid, unspecified: Secondary | ICD-10-CM | POA: Diagnosis not present

## 2021-09-03 DIAGNOSIS — I509 Heart failure, unspecified: Secondary | ICD-10-CM | POA: Diagnosis not present

## 2021-09-03 DIAGNOSIS — F0394 Unspecified dementia, unspecified severity, with anxiety: Secondary | ICD-10-CM | POA: Diagnosis not present

## 2021-09-03 DIAGNOSIS — F0393 Unspecified dementia, unspecified severity, with mood disturbance: Secondary | ICD-10-CM | POA: Diagnosis not present

## 2021-09-03 DIAGNOSIS — E876 Hypokalemia: Secondary | ICD-10-CM | POA: Diagnosis not present

## 2021-09-06 DIAGNOSIS — G9389 Other specified disorders of brain: Secondary | ICD-10-CM | POA: Diagnosis not present

## 2021-09-06 DIAGNOSIS — E079 Disorder of thyroid, unspecified: Secondary | ICD-10-CM | POA: Diagnosis not present

## 2021-09-06 DIAGNOSIS — H9191 Unspecified hearing loss, right ear: Secondary | ICD-10-CM | POA: Diagnosis not present

## 2021-09-06 DIAGNOSIS — F0394 Unspecified dementia, unspecified severity, with anxiety: Secondary | ICD-10-CM | POA: Diagnosis not present

## 2021-09-06 DIAGNOSIS — M810 Age-related osteoporosis without current pathological fracture: Secondary | ICD-10-CM | POA: Diagnosis not present

## 2021-09-06 DIAGNOSIS — F0393 Unspecified dementia, unspecified severity, with mood disturbance: Secondary | ICD-10-CM | POA: Diagnosis not present

## 2021-09-06 DIAGNOSIS — E876 Hypokalemia: Secondary | ICD-10-CM | POA: Diagnosis not present

## 2021-09-06 DIAGNOSIS — I509 Heart failure, unspecified: Secondary | ICD-10-CM | POA: Diagnosis not present

## 2021-09-06 DIAGNOSIS — I110 Hypertensive heart disease with heart failure: Secondary | ICD-10-CM | POA: Diagnosis not present

## 2021-09-08 DIAGNOSIS — F0394 Unspecified dementia, unspecified severity, with anxiety: Secondary | ICD-10-CM | POA: Diagnosis not present

## 2021-09-08 DIAGNOSIS — H9191 Unspecified hearing loss, right ear: Secondary | ICD-10-CM | POA: Diagnosis not present

## 2021-09-08 DIAGNOSIS — E079 Disorder of thyroid, unspecified: Secondary | ICD-10-CM | POA: Diagnosis not present

## 2021-09-08 DIAGNOSIS — F0393 Unspecified dementia, unspecified severity, with mood disturbance: Secondary | ICD-10-CM | POA: Diagnosis not present

## 2021-09-08 DIAGNOSIS — E876 Hypokalemia: Secondary | ICD-10-CM | POA: Diagnosis not present

## 2021-09-08 DIAGNOSIS — M810 Age-related osteoporosis without current pathological fracture: Secondary | ICD-10-CM | POA: Diagnosis not present

## 2021-09-08 DIAGNOSIS — G9389 Other specified disorders of brain: Secondary | ICD-10-CM | POA: Diagnosis not present

## 2021-09-08 DIAGNOSIS — I509 Heart failure, unspecified: Secondary | ICD-10-CM | POA: Diagnosis not present

## 2021-09-08 DIAGNOSIS — I110 Hypertensive heart disease with heart failure: Secondary | ICD-10-CM | POA: Diagnosis not present

## 2021-09-09 DIAGNOSIS — I509 Heart failure, unspecified: Secondary | ICD-10-CM | POA: Diagnosis not present

## 2021-09-09 DIAGNOSIS — M810 Age-related osteoporosis without current pathological fracture: Secondary | ICD-10-CM | POA: Diagnosis not present

## 2021-09-09 DIAGNOSIS — F0394 Unspecified dementia, unspecified severity, with anxiety: Secondary | ICD-10-CM | POA: Diagnosis not present

## 2021-09-09 DIAGNOSIS — G9389 Other specified disorders of brain: Secondary | ICD-10-CM | POA: Diagnosis not present

## 2021-09-09 DIAGNOSIS — H9191 Unspecified hearing loss, right ear: Secondary | ICD-10-CM | POA: Diagnosis not present

## 2021-09-09 DIAGNOSIS — E079 Disorder of thyroid, unspecified: Secondary | ICD-10-CM | POA: Diagnosis not present

## 2021-09-09 DIAGNOSIS — I110 Hypertensive heart disease with heart failure: Secondary | ICD-10-CM | POA: Diagnosis not present

## 2021-09-09 DIAGNOSIS — E876 Hypokalemia: Secondary | ICD-10-CM | POA: Diagnosis not present

## 2021-09-09 DIAGNOSIS — F0393 Unspecified dementia, unspecified severity, with mood disturbance: Secondary | ICD-10-CM | POA: Diagnosis not present

## 2021-09-13 DIAGNOSIS — R29898 Other symptoms and signs involving the musculoskeletal system: Secondary | ICD-10-CM | POA: Diagnosis not present

## 2021-09-13 DIAGNOSIS — R32 Unspecified urinary incontinence: Secondary | ICD-10-CM | POA: Diagnosis not present

## 2021-09-13 DIAGNOSIS — F0393 Unspecified dementia, unspecified severity, with mood disturbance: Secondary | ICD-10-CM | POA: Diagnosis not present

## 2021-09-13 DIAGNOSIS — Z0000 Encounter for general adult medical examination without abnormal findings: Secondary | ICD-10-CM | POA: Diagnosis not present

## 2021-09-13 DIAGNOSIS — F0390 Unspecified dementia without behavioral disturbance: Secondary | ICD-10-CM | POA: Diagnosis not present

## 2021-09-13 DIAGNOSIS — E785 Hyperlipidemia, unspecified: Secondary | ICD-10-CM | POA: Diagnosis not present

## 2021-09-13 DIAGNOSIS — G9389 Other specified disorders of brain: Secondary | ICD-10-CM | POA: Diagnosis not present

## 2021-09-13 DIAGNOSIS — I509 Heart failure, unspecified: Secondary | ICD-10-CM | POA: Diagnosis not present

## 2021-09-13 DIAGNOSIS — E876 Hypokalemia: Secondary | ICD-10-CM | POA: Diagnosis not present

## 2021-09-13 DIAGNOSIS — E079 Disorder of thyroid, unspecified: Secondary | ICD-10-CM | POA: Diagnosis not present

## 2021-09-13 DIAGNOSIS — Z1389 Encounter for screening for other disorder: Secondary | ICD-10-CM | POA: Diagnosis not present

## 2021-09-13 DIAGNOSIS — I110 Hypertensive heart disease with heart failure: Secondary | ICD-10-CM | POA: Diagnosis not present

## 2021-09-13 DIAGNOSIS — H9191 Unspecified hearing loss, right ear: Secondary | ICD-10-CM | POA: Diagnosis not present

## 2021-09-13 DIAGNOSIS — R296 Repeated falls: Secondary | ICD-10-CM | POA: Diagnosis not present

## 2021-09-13 DIAGNOSIS — F0394 Unspecified dementia, unspecified severity, with anxiety: Secondary | ICD-10-CM | POA: Diagnosis not present

## 2021-09-13 DIAGNOSIS — M810 Age-related osteoporosis without current pathological fracture: Secondary | ICD-10-CM | POA: Diagnosis not present

## 2021-09-13 DIAGNOSIS — I2510 Atherosclerotic heart disease of native coronary artery without angina pectoris: Secondary | ICD-10-CM | POA: Diagnosis not present

## 2021-09-13 DIAGNOSIS — I5032 Chronic diastolic (congestive) heart failure: Secondary | ICD-10-CM | POA: Diagnosis not present

## 2021-09-13 DIAGNOSIS — I10 Essential (primary) hypertension: Secondary | ICD-10-CM | POA: Diagnosis not present

## 2021-09-14 DIAGNOSIS — I110 Hypertensive heart disease with heart failure: Secondary | ICD-10-CM | POA: Diagnosis not present

## 2021-09-14 DIAGNOSIS — G9389 Other specified disorders of brain: Secondary | ICD-10-CM | POA: Diagnosis not present

## 2021-09-14 DIAGNOSIS — E079 Disorder of thyroid, unspecified: Secondary | ICD-10-CM | POA: Diagnosis not present

## 2021-09-14 DIAGNOSIS — M810 Age-related osteoporosis without current pathological fracture: Secondary | ICD-10-CM | POA: Diagnosis not present

## 2021-09-14 DIAGNOSIS — F0393 Unspecified dementia, unspecified severity, with mood disturbance: Secondary | ICD-10-CM | POA: Diagnosis not present

## 2021-09-14 DIAGNOSIS — I509 Heart failure, unspecified: Secondary | ICD-10-CM | POA: Diagnosis not present

## 2021-09-14 DIAGNOSIS — F0394 Unspecified dementia, unspecified severity, with anxiety: Secondary | ICD-10-CM | POA: Diagnosis not present

## 2021-09-14 DIAGNOSIS — H9191 Unspecified hearing loss, right ear: Secondary | ICD-10-CM | POA: Diagnosis not present

## 2021-09-14 DIAGNOSIS — E876 Hypokalemia: Secondary | ICD-10-CM | POA: Diagnosis not present

## 2021-09-15 DIAGNOSIS — E876 Hypokalemia: Secondary | ICD-10-CM | POA: Diagnosis not present

## 2021-09-15 DIAGNOSIS — E079 Disorder of thyroid, unspecified: Secondary | ICD-10-CM | POA: Diagnosis not present

## 2021-09-15 DIAGNOSIS — H9191 Unspecified hearing loss, right ear: Secondary | ICD-10-CM | POA: Diagnosis not present

## 2021-09-15 DIAGNOSIS — I509 Heart failure, unspecified: Secondary | ICD-10-CM | POA: Diagnosis not present

## 2021-09-15 DIAGNOSIS — G9389 Other specified disorders of brain: Secondary | ICD-10-CM | POA: Diagnosis not present

## 2021-09-15 DIAGNOSIS — M810 Age-related osteoporosis without current pathological fracture: Secondary | ICD-10-CM | POA: Diagnosis not present

## 2021-09-15 DIAGNOSIS — I110 Hypertensive heart disease with heart failure: Secondary | ICD-10-CM | POA: Diagnosis not present

## 2021-09-15 DIAGNOSIS — F0393 Unspecified dementia, unspecified severity, with mood disturbance: Secondary | ICD-10-CM | POA: Diagnosis not present

## 2021-09-15 DIAGNOSIS — F0394 Unspecified dementia, unspecified severity, with anxiety: Secondary | ICD-10-CM | POA: Diagnosis not present

## 2021-09-20 DIAGNOSIS — M810 Age-related osteoporosis without current pathological fracture: Secondary | ICD-10-CM | POA: Diagnosis not present

## 2021-09-20 DIAGNOSIS — I110 Hypertensive heart disease with heart failure: Secondary | ICD-10-CM | POA: Diagnosis not present

## 2021-09-20 DIAGNOSIS — E079 Disorder of thyroid, unspecified: Secondary | ICD-10-CM | POA: Diagnosis not present

## 2021-09-20 DIAGNOSIS — F0393 Unspecified dementia, unspecified severity, with mood disturbance: Secondary | ICD-10-CM | POA: Diagnosis not present

## 2021-09-20 DIAGNOSIS — E876 Hypokalemia: Secondary | ICD-10-CM | POA: Diagnosis not present

## 2021-09-20 DIAGNOSIS — H9191 Unspecified hearing loss, right ear: Secondary | ICD-10-CM | POA: Diagnosis not present

## 2021-09-20 DIAGNOSIS — G9389 Other specified disorders of brain: Secondary | ICD-10-CM | POA: Diagnosis not present

## 2021-09-20 DIAGNOSIS — F0394 Unspecified dementia, unspecified severity, with anxiety: Secondary | ICD-10-CM | POA: Diagnosis not present

## 2021-09-20 DIAGNOSIS — I509 Heart failure, unspecified: Secondary | ICD-10-CM | POA: Diagnosis not present

## 2021-09-22 DIAGNOSIS — H9191 Unspecified hearing loss, right ear: Secondary | ICD-10-CM | POA: Diagnosis not present

## 2021-09-22 DIAGNOSIS — G9389 Other specified disorders of brain: Secondary | ICD-10-CM | POA: Diagnosis not present

## 2021-09-22 DIAGNOSIS — F0394 Unspecified dementia, unspecified severity, with anxiety: Secondary | ICD-10-CM | POA: Diagnosis not present

## 2021-09-22 DIAGNOSIS — M810 Age-related osteoporosis without current pathological fracture: Secondary | ICD-10-CM | POA: Diagnosis not present

## 2021-09-22 DIAGNOSIS — I110 Hypertensive heart disease with heart failure: Secondary | ICD-10-CM | POA: Diagnosis not present

## 2021-09-22 DIAGNOSIS — F0393 Unspecified dementia, unspecified severity, with mood disturbance: Secondary | ICD-10-CM | POA: Diagnosis not present

## 2021-09-22 DIAGNOSIS — I509 Heart failure, unspecified: Secondary | ICD-10-CM | POA: Diagnosis not present

## 2021-09-22 DIAGNOSIS — E876 Hypokalemia: Secondary | ICD-10-CM | POA: Diagnosis not present

## 2021-09-22 DIAGNOSIS — E079 Disorder of thyroid, unspecified: Secondary | ICD-10-CM | POA: Diagnosis not present

## 2021-09-27 DIAGNOSIS — M810 Age-related osteoporosis without current pathological fracture: Secondary | ICD-10-CM | POA: Diagnosis not present

## 2021-09-27 DIAGNOSIS — E079 Disorder of thyroid, unspecified: Secondary | ICD-10-CM | POA: Diagnosis not present

## 2021-09-27 DIAGNOSIS — F0393 Unspecified dementia, unspecified severity, with mood disturbance: Secondary | ICD-10-CM | POA: Diagnosis not present

## 2021-09-27 DIAGNOSIS — H9191 Unspecified hearing loss, right ear: Secondary | ICD-10-CM | POA: Diagnosis not present

## 2021-09-27 DIAGNOSIS — I509 Heart failure, unspecified: Secondary | ICD-10-CM | POA: Diagnosis not present

## 2021-09-27 DIAGNOSIS — G9389 Other specified disorders of brain: Secondary | ICD-10-CM | POA: Diagnosis not present

## 2021-09-27 DIAGNOSIS — E876 Hypokalemia: Secondary | ICD-10-CM | POA: Diagnosis not present

## 2021-09-27 DIAGNOSIS — I110 Hypertensive heart disease with heart failure: Secondary | ICD-10-CM | POA: Diagnosis not present

## 2021-09-27 DIAGNOSIS — F0394 Unspecified dementia, unspecified severity, with anxiety: Secondary | ICD-10-CM | POA: Diagnosis not present

## 2021-10-04 DIAGNOSIS — I509 Heart failure, unspecified: Secondary | ICD-10-CM | POA: Diagnosis not present

## 2021-10-04 DIAGNOSIS — F0394 Unspecified dementia, unspecified severity, with anxiety: Secondary | ICD-10-CM | POA: Diagnosis not present

## 2021-10-04 DIAGNOSIS — F0393 Unspecified dementia, unspecified severity, with mood disturbance: Secondary | ICD-10-CM | POA: Diagnosis not present

## 2021-10-04 DIAGNOSIS — G9389 Other specified disorders of brain: Secondary | ICD-10-CM | POA: Diagnosis not present

## 2021-10-04 DIAGNOSIS — H9191 Unspecified hearing loss, right ear: Secondary | ICD-10-CM | POA: Diagnosis not present

## 2021-10-04 DIAGNOSIS — M810 Age-related osteoporosis without current pathological fracture: Secondary | ICD-10-CM | POA: Diagnosis not present

## 2021-10-04 DIAGNOSIS — E079 Disorder of thyroid, unspecified: Secondary | ICD-10-CM | POA: Diagnosis not present

## 2021-10-04 DIAGNOSIS — E876 Hypokalemia: Secondary | ICD-10-CM | POA: Diagnosis not present

## 2021-10-04 DIAGNOSIS — I110 Hypertensive heart disease with heart failure: Secondary | ICD-10-CM | POA: Diagnosis not present

## 2021-10-11 DIAGNOSIS — M810 Age-related osteoporosis without current pathological fracture: Secondary | ICD-10-CM | POA: Diagnosis not present

## 2021-10-11 DIAGNOSIS — E079 Disorder of thyroid, unspecified: Secondary | ICD-10-CM | POA: Diagnosis not present

## 2021-10-11 DIAGNOSIS — I509 Heart failure, unspecified: Secondary | ICD-10-CM | POA: Diagnosis not present

## 2021-10-11 DIAGNOSIS — H9191 Unspecified hearing loss, right ear: Secondary | ICD-10-CM | POA: Diagnosis not present

## 2021-10-11 DIAGNOSIS — G9389 Other specified disorders of brain: Secondary | ICD-10-CM | POA: Diagnosis not present

## 2021-10-11 DIAGNOSIS — E876 Hypokalemia: Secondary | ICD-10-CM | POA: Diagnosis not present

## 2021-10-11 DIAGNOSIS — F0394 Unspecified dementia, unspecified severity, with anxiety: Secondary | ICD-10-CM | POA: Diagnosis not present

## 2021-10-11 DIAGNOSIS — I110 Hypertensive heart disease with heart failure: Secondary | ICD-10-CM | POA: Diagnosis not present

## 2021-10-11 DIAGNOSIS — F0393 Unspecified dementia, unspecified severity, with mood disturbance: Secondary | ICD-10-CM | POA: Diagnosis not present

## 2021-10-18 ENCOUNTER — Emergency Department (HOSPITAL_COMMUNITY): Payer: Medicare PPO

## 2021-10-18 ENCOUNTER — Other Ambulatory Visit: Payer: Self-pay

## 2021-10-18 ENCOUNTER — Ambulatory Visit: Payer: BC Managed Care – PPO | Admitting: Neurology

## 2021-10-18 ENCOUNTER — Inpatient Hospital Stay (HOSPITAL_COMMUNITY): Payer: Medicare PPO

## 2021-10-18 ENCOUNTER — Inpatient Hospital Stay (HOSPITAL_COMMUNITY)
Admission: EM | Admit: 2021-10-18 | Discharge: 2021-10-21 | DRG: 641 | Disposition: A | Discharge status: Skilled Nursing Facility | Payer: Medicare PPO | Attending: Internal Medicine | Admitting: Internal Medicine

## 2021-10-18 DIAGNOSIS — E876 Hypokalemia: Secondary | ICD-10-CM

## 2021-10-18 DIAGNOSIS — R404 Transient alteration of awareness: Secondary | ICD-10-CM | POA: Diagnosis not present

## 2021-10-18 DIAGNOSIS — Z91018 Allergy to other foods: Secondary | ICD-10-CM | POA: Diagnosis not present

## 2021-10-18 DIAGNOSIS — B9620 Unspecified Escherichia coli [E. coli] as the cause of diseases classified elsewhere: Secondary | ICD-10-CM | POA: Diagnosis not present

## 2021-10-18 DIAGNOSIS — Z803 Family history of malignant neoplasm of breast: Secondary | ICD-10-CM

## 2021-10-18 DIAGNOSIS — N390 Urinary tract infection, site not specified: Secondary | ICD-10-CM | POA: Diagnosis not present

## 2021-10-18 DIAGNOSIS — R9431 Abnormal electrocardiogram [ECG] [EKG]: Secondary | ICD-10-CM | POA: Diagnosis present

## 2021-10-18 DIAGNOSIS — E860 Dehydration: Secondary | ICD-10-CM | POA: Diagnosis present

## 2021-10-18 DIAGNOSIS — F0393 Unspecified dementia, unspecified severity, with mood disturbance: Secondary | ICD-10-CM | POA: Diagnosis present

## 2021-10-18 DIAGNOSIS — N179 Acute kidney failure, unspecified: Secondary | ICD-10-CM

## 2021-10-18 DIAGNOSIS — Z885 Allergy status to narcotic agent status: Secondary | ICD-10-CM

## 2021-10-18 DIAGNOSIS — Z8673 Personal history of transient ischemic attack (TIA), and cerebral infarction without residual deficits: Secondary | ICD-10-CM

## 2021-10-18 DIAGNOSIS — R531 Weakness: Secondary | ICD-10-CM | POA: Diagnosis not present

## 2021-10-18 DIAGNOSIS — F0390 Unspecified dementia without behavioral disturbance: Secondary | ICD-10-CM | POA: Diagnosis present

## 2021-10-18 DIAGNOSIS — N3000 Acute cystitis without hematuria: Secondary | ICD-10-CM | POA: Diagnosis not present

## 2021-10-18 DIAGNOSIS — R0902 Hypoxemia: Secondary | ICD-10-CM | POA: Diagnosis not present

## 2021-10-18 DIAGNOSIS — Z66 Do not resuscitate: Secondary | ICD-10-CM | POA: Diagnosis not present

## 2021-10-18 DIAGNOSIS — Z9049 Acquired absence of other specified parts of digestive tract: Secondary | ICD-10-CM | POA: Diagnosis not present

## 2021-10-18 DIAGNOSIS — Z79899 Other long term (current) drug therapy: Secondary | ICD-10-CM

## 2021-10-18 DIAGNOSIS — K449 Diaphragmatic hernia without obstruction or gangrene: Secondary | ICD-10-CM | POA: Diagnosis not present

## 2021-10-18 DIAGNOSIS — I5032 Chronic diastolic (congestive) heart failure: Secondary | ICD-10-CM | POA: Diagnosis present

## 2021-10-18 DIAGNOSIS — Z91040 Latex allergy status: Secondary | ICD-10-CM

## 2021-10-18 DIAGNOSIS — Z888 Allergy status to other drugs, medicaments and biological substances status: Secondary | ICD-10-CM

## 2021-10-18 DIAGNOSIS — Z7401 Bed confinement status: Secondary | ICD-10-CM | POA: Diagnosis not present

## 2021-10-18 DIAGNOSIS — R5381 Other malaise: Secondary | ICD-10-CM | POA: Diagnosis present

## 2021-10-18 DIAGNOSIS — R778 Other specified abnormalities of plasma proteins: Secondary | ICD-10-CM | POA: Diagnosis present

## 2021-10-18 DIAGNOSIS — Z853 Personal history of malignant neoplasm of breast: Secondary | ICD-10-CM

## 2021-10-18 DIAGNOSIS — Z20822 Contact with and (suspected) exposure to covid-19: Secondary | ICD-10-CM | POA: Diagnosis present

## 2021-10-18 DIAGNOSIS — I110 Hypertensive heart disease with heart failure: Secondary | ICD-10-CM | POA: Diagnosis present

## 2021-10-18 DIAGNOSIS — N3289 Other specified disorders of bladder: Secondary | ICD-10-CM | POA: Diagnosis not present

## 2021-10-18 DIAGNOSIS — I160 Hypertensive urgency: Secondary | ICD-10-CM | POA: Diagnosis present

## 2021-10-18 DIAGNOSIS — K582 Mixed irritable bowel syndrome: Secondary | ICD-10-CM | POA: Diagnosis not present

## 2021-10-18 DIAGNOSIS — F0394 Unspecified dementia, unspecified severity, with anxiety: Secondary | ICD-10-CM | POA: Diagnosis present

## 2021-10-18 DIAGNOSIS — I10 Essential (primary) hypertension: Secondary | ICD-10-CM | POA: Diagnosis present

## 2021-10-18 DIAGNOSIS — R410 Disorientation, unspecified: Secondary | ICD-10-CM | POA: Diagnosis not present

## 2021-10-18 DIAGNOSIS — R509 Fever, unspecified: Secondary | ICD-10-CM | POA: Diagnosis not present

## 2021-10-18 DIAGNOSIS — Z8249 Family history of ischemic heart disease and other diseases of the circulatory system: Secondary | ICD-10-CM

## 2021-10-18 DIAGNOSIS — Z23 Encounter for immunization: Secondary | ICD-10-CM

## 2021-10-18 DIAGNOSIS — R488 Other symbolic dysfunctions: Secondary | ICD-10-CM | POA: Diagnosis not present

## 2021-10-18 DIAGNOSIS — E785 Hyperlipidemia, unspecified: Secondary | ICD-10-CM | POA: Diagnosis present

## 2021-10-18 DIAGNOSIS — E7800 Pure hypercholesterolemia, unspecified: Secondary | ICD-10-CM | POA: Diagnosis not present

## 2021-10-18 DIAGNOSIS — M6281 Muscle weakness (generalized): Secondary | ICD-10-CM | POA: Diagnosis present

## 2021-10-18 DIAGNOSIS — Z9011 Acquired absence of right breast and nipple: Secondary | ICD-10-CM

## 2021-10-18 DIAGNOSIS — F411 Generalized anxiety disorder: Secondary | ICD-10-CM | POA: Diagnosis not present

## 2021-10-18 DIAGNOSIS — R4182 Altered mental status, unspecified: Secondary | ICD-10-CM | POA: Diagnosis not present

## 2021-10-18 DIAGNOSIS — E215 Disorder of parathyroid gland, unspecified: Secondary | ICD-10-CM | POA: Diagnosis present

## 2021-10-18 DIAGNOSIS — S0990XA Unspecified injury of head, initial encounter: Secondary | ICD-10-CM | POA: Diagnosis not present

## 2021-10-18 LAB — RENAL FUNCTION PANEL
Albumin: 4.2 g/dL (ref 3.5–5.0)
Anion gap: 12 (ref 5–15)
BUN: 14 mg/dL (ref 8–23)
CO2: 29 mmol/L (ref 22–32)
Calcium: 12.4 mg/dL — ABNORMAL HIGH (ref 8.9–10.3)
Chloride: 104 mmol/L (ref 98–111)
Creatinine, Ser: 1.26 mg/dL — ABNORMAL HIGH (ref 0.44–1.00)
GFR, Estimated: 46 mL/min — ABNORMAL LOW (ref >60)
Glucose, Bld: 96 mg/dL (ref 70–99)
Phosphorus: 2.1 mg/dL — ABNORMAL LOW (ref 2.5–4.6)
Potassium: 2.6 mmol/L — CL (ref 3.5–5.1)
Sodium: 145 mmol/L (ref 135–145)

## 2021-10-18 LAB — VITAMIN D 25 HYDROXY (VIT D DEFICIENCY, FRACTURES): Vit D, 25-Hydroxy: 91.9 ng/mL (ref 30–100)

## 2021-10-18 LAB — URINALYSIS, ROUTINE W REFLEX MICROSCOPIC
Bilirubin Urine: NEGATIVE
Glucose, UA: NEGATIVE mg/dL
Hgb urine dipstick: NEGATIVE
Ketones, ur: NEGATIVE mg/dL
Nitrite: POSITIVE — AB
Protein, ur: NEGATIVE mg/dL
Specific Gravity, Urine: 1.011 (ref 1.005–1.030)
pH: 6 (ref 5.0–8.0)

## 2021-10-18 LAB — TROPONIN I (HIGH SENSITIVITY)
Troponin I (High Sensitivity): 28 ng/L — ABNORMAL HIGH (ref <18)
Troponin I (High Sensitivity): 28 ng/L — ABNORMAL HIGH (ref <18)

## 2021-10-18 LAB — CBC WITH DIFFERENTIAL/PLATELET
Abs Immature Granulocytes: 0.02 10*3/uL (ref 0.00–0.07)
Basophils Absolute: 0.1 10*3/uL (ref 0.0–0.1)
Basophils Relative: 1 %
Eosinophils Absolute: 0.1 10*3/uL (ref 0.0–0.5)
Eosinophils Relative: 1 %
HCT: 32.7 % — ABNORMAL LOW (ref 36.0–46.0)
Hemoglobin: 11.7 g/dL — ABNORMAL LOW (ref 12.0–15.0)
Immature Granulocytes: 0 %
Lymphocytes Relative: 17 %
Lymphs Abs: 1.4 10*3/uL (ref 0.7–4.0)
MCH: 32 pg (ref 26.0–34.0)
MCHC: 35.8 g/dL (ref 30.0–36.0)
MCV: 89.3 fL (ref 80.0–100.0)
Monocytes Absolute: 0.7 10*3/uL (ref 0.1–1.0)
Monocytes Relative: 8 %
Neutro Abs: 6.1 10*3/uL (ref 1.7–7.7)
Neutrophils Relative %: 73 %
Platelets: 287 10*3/uL (ref 150–400)
RBC: 3.66 MIL/uL — ABNORMAL LOW (ref 3.87–5.11)
RDW: 12.8 % (ref 11.5–15.5)
WBC: 8.3 10*3/uL (ref 4.0–10.5)
nRBC: 0 % (ref 0.0–0.2)

## 2021-10-18 LAB — COMPREHENSIVE METABOLIC PANEL
ALT: 15 U/L (ref 0–44)
AST: 25 U/L (ref 15–41)
Albumin: 4.5 g/dL (ref 3.5–5.0)
Alkaline Phosphatase: 37 U/L — ABNORMAL LOW (ref 38–126)
Anion gap: 10 (ref 5–15)
BUN: 17 mg/dL (ref 8–23)
CO2: 34 mmol/L — ABNORMAL HIGH (ref 22–32)
Calcium: 13.4 mg/dL (ref 8.9–10.3)
Chloride: 100 mmol/L (ref 98–111)
Creatinine, Ser: 1.39 mg/dL — ABNORMAL HIGH (ref 0.44–1.00)
GFR, Estimated: 41 mL/min — ABNORMAL LOW (ref >60)
Glucose, Bld: 107 mg/dL — ABNORMAL HIGH (ref 70–99)
Potassium: 2 mmol/L — CL (ref 3.5–5.1)
Sodium: 144 mmol/L (ref 135–145)
Total Bilirubin: 1.7 mg/dL — ABNORMAL HIGH (ref 0.3–1.2)
Total Protein: 7.4 g/dL (ref 6.5–8.1)

## 2021-10-18 LAB — CBG MONITORING, ED: Glucose-Capillary: 98 mg/dL (ref 70–99)

## 2021-10-18 LAB — RESP PANEL BY RT-PCR (FLU A&B, COVID) ARPGX2
Influenza A by PCR: NEGATIVE
Influenza B by PCR: NEGATIVE
SARS Coronavirus 2 by RT PCR: NEGATIVE

## 2021-10-18 LAB — LACTIC ACID, PLASMA: Lactic Acid, Venous: 0.7 mmol/L (ref 0.5–1.9)

## 2021-10-18 LAB — MAGNESIUM: Magnesium: 1.8 mg/dL (ref 1.7–2.4)

## 2021-10-18 MED ORDER — PNEUMOCOCCAL VAC POLYVALENT 25 MCG/0.5ML IJ INJ
0.5000 mL | INJECTION | INTRAMUSCULAR | Status: AC
  Administered 2021-10-19: 0.5 mL via INTRAMUSCULAR
  Filled 2021-10-18: qty 0.5

## 2021-10-18 MED ORDER — SODIUM CHLORIDE 0.9 % IV SOLN
1.0000 g | INTRAVENOUS | Status: DC
  Administered 2021-10-19 – 2021-10-20 (×2): 1 g via INTRAVENOUS
  Filled 2021-10-18 (×2): qty 10

## 2021-10-18 MED ORDER — POTASSIUM CHLORIDE 10 MEQ/100ML IV SOLN
INTRAVENOUS | Status: AC
  Administered 2021-10-18: 10 meq
  Filled 2021-10-18: qty 100

## 2021-10-18 MED ORDER — SODIUM PHOSPHATES 45 MMOLE/15ML IV SOLN
15.0000 mmol | Freq: Once | INTRAVENOUS | Status: AC
  Administered 2021-10-18: 15 mmol via INTRAVENOUS
  Filled 2021-10-18: qty 5

## 2021-10-18 MED ORDER — HYDRALAZINE HCL 25 MG PO TABS
37.5000 mg | ORAL_TABLET | Freq: Three times a day (TID) | ORAL | Status: DC
  Administered 2021-10-18 – 2021-10-21 (×9): 37.5 mg via ORAL
  Filled 2021-10-18 (×9): qty 2

## 2021-10-18 MED ORDER — LAMOTRIGINE 100 MG PO TABS
100.0000 mg | ORAL_TABLET | Freq: Two times a day (BID) | ORAL | Status: DC
  Administered 2021-10-18 – 2021-10-21 (×7): 100 mg via ORAL
  Filled 2021-10-18 (×7): qty 1

## 2021-10-18 MED ORDER — METOPROLOL TARTRATE 5 MG/5ML IV SOLN: 5.0000 mg | Freq: Four times a day (QID) | INTRAVENOUS | Status: DC | PRN

## 2021-10-18 MED ORDER — POTASSIUM CHLORIDE 10 MEQ/100ML IV SOLN
10.0000 meq | INTRAVENOUS | Status: AC
  Administered 2021-10-18 (×5): 10 meq via INTRAVENOUS
  Filled 2021-10-18 (×4): qty 100

## 2021-10-18 MED ORDER — CEFTRIAXONE SODIUM 1 G IJ SOLR
1.0000 g | Freq: Once | INTRAMUSCULAR | Status: AC
  Administered 2021-10-18: 1 g via INTRAMUSCULAR
  Filled 2021-10-18 (×2): qty 10

## 2021-10-18 MED ORDER — POTASSIUM CHLORIDE CRYS ER 20 MEQ PO TBCR
40.0000 meq | EXTENDED_RELEASE_TABLET | Freq: Every day | ORAL | Status: DC
  Administered 2021-10-18: 40 meq via ORAL
  Filled 2021-10-18: qty 2

## 2021-10-18 MED ORDER — ASCORBIC ACID 500 MG PO TABS
250.0000 mg | ORAL_TABLET | Freq: Every day | ORAL | Status: DC
  Administered 2021-10-18 – 2021-10-21 (×4): 250 mg via ORAL
  Filled 2021-10-18 (×4): qty 1

## 2021-10-18 MED ORDER — POTASSIUM CHLORIDE CRYS ER 20 MEQ PO TBCR
40.0000 meq | EXTENDED_RELEASE_TABLET | Freq: Once | ORAL | Status: AC
  Administered 2021-10-18: 40 meq via ORAL
  Filled 2021-10-18: qty 2

## 2021-10-18 MED ORDER — POTASSIUM CHLORIDE 10 MEQ/100ML IV SOLN
10.0000 meq | INTRAVENOUS | Status: DC
  Administered 2021-10-18: 10 meq via INTRAVENOUS
  Filled 2021-10-18: qty 100

## 2021-10-18 MED ORDER — LIDOCAINE HCL 1 % IJ SOLN
INTRAMUSCULAR | Status: AC
  Administered 2021-10-18: 2.1 mL
  Filled 2021-10-18: qty 20

## 2021-10-18 MED ORDER — MAGNESIUM OXIDE -MG SUPPLEMENT 400 (240 MG) MG PO TABS
400.0000 mg | ORAL_TABLET | Freq: Every day | ORAL | Status: DC
  Administered 2021-10-18 – 2021-10-21 (×4): 400 mg via ORAL
  Filled 2021-10-18 (×4): qty 1

## 2021-10-18 MED ORDER — LACTATED RINGERS IV BOLUS
1000.0000 mL | Freq: Once | INTRAVENOUS | Status: AC
  Administered 2021-10-18: 1000 mL via INTRAVENOUS

## 2021-10-18 MED ORDER — DOCUSATE SODIUM 100 MG PO CAPS
100.0000 mg | ORAL_CAPSULE | Freq: Every day | ORAL | Status: DC
  Administered 2021-10-18 – 2021-10-20 (×3): 100 mg via ORAL
  Filled 2021-10-18 (×4): qty 1

## 2021-10-18 MED ORDER — SODIUM CHLORIDE 0.9 % IV SOLN: INTRAVENOUS | Status: DC

## 2021-10-18 NOTE — H&P (Addendum)
History and Physical    [name]:[id number] DOB: 1951/07/08 DOA: 10/18/2021  PCP: Cari Caraway, MD  Patient coming from: Home  Chief Complaint: weakness  HPI: Sarah Cook is a 70 y.o. female with medical history significant of demential, HTN, anxiety, parathyroid disease. Presenting with generalize weakness. History is from husband at bedside. He noted that she has a chronic history of weakness, but was doing better after physical therapy. He has noticed that her appetite has been poor for the last 5 days. She does not complain of N/V or abdominal pain. She just doesn't want to eat. He has noticed that she has been generally weak over the last 2 days. He is having difficulty getting her in and out of bed. She is not complaining of pain. She is not complaining of any other symptoms. Her husband decided to bring her to the hospital this morning when her symptoms did not improved.   ED Course: K+ was 2.0. Ca2+ was 13.4. Her UA was dirty. She was started on K+, fluids and rocephin. TRH was called for admission.   Review of Systems:  Denies CP, dyspnea, palpitations, N/V/D, fever, abdominal pain. Reports weakness. Review of systems is otherwise negative for all not mentioned in HPI.   PMHx Past Medical History:  Diagnosis Date   Allergy    Anxiety    Breast cancer ([place])    Cataract    early signs of   Gait abnormality    Heart disease    Hemorrhoids    Hiatal hernia 11/07/2012   large   Hyperlipidemia    Hyperparathyroidism ([place])    Hypertension    Memory loss    Osteoporosis    Parathyroid disorder ([place])    Pulmonary nodules    TIA (transient ischemic attack)    Urinary incontinence     PSHx Past Surgical History:  Procedure Laterality Date   BLADDER SUSPENSION     CESAREAN SECTION     x 2   CHOLECYSTECTOMY     MASTECTOMY Right    PARATHYROIDECTOMY     TOTAL ELBOW REPLACEMENT     TUBAL LIGATION      SocHx  reports that she has never smoked. She has  never used smokeless tobacco. She reports that she does not drink alcohol and does not use drugs.  Allergies  Allergen Reactions   Anesthetics, Ester Nausea And Vomiting   Codeine Nausea Only   Tape Other (See Comments)    Skin blisters   Amlodipine Other (See Comments) and Swelling   Cetirizine Hcl     Other reaction(s): altered consciousness   Hydrochlorothiazide Other (See Comments)   Hydrocodone Nausea And Vomiting   Onion     Does not like   Latex Itching and Rash    Irritates skin    FamHx Family History  Problem Relation Age of Onset   Breast cancer Mother    Sudden death Father    Heart failure Father    Hypertension Other    Heart disease Other    Colon cancer Neg Hx    Esophageal cancer Neg Hx    Stomach cancer Neg Hx    Rectal cancer Neg Hx     Prior to Admission medications   Medication Sig Start Date End Date Taking? Authorizing Provider  acetaminophen (TYLENOL) 650 MG CR tablet Take 1,300 mg by mouth in the morning and at bedtime.    [provider]  amLODipine (NORVASC) 10 MG tablet Take 1  tablet (10 mg total) by mouth daily. 07/28/21   Hongalgi, Lenis Dickinson, MD  Ascorbic Acid (VITAMIN C PO) Take 1 tablet by mouth daily.    [provider]  Cholecalciferol (VITAMIN D3) 2000 units TABS Take 2,000 Units by mouth daily.     [provider]  CINNAMON PO Take 1 tablet by mouth daily.    [provider]  Coenzyme Q10 (CO Q 10 PO) Take 1 tablet by mouth daily.    [provider]  docusate sodium (COLACE) 100 MG capsule Take 100 mg by mouth daily.    [provider]  hydrALAZINE (APRESOLINE) 25 MG tablet Take 1.5 tablets (37.5 mg total) by mouth 3 (three) times daily. 07/28/21   Hongalgi, Lenis Dickinson, MD  irbesartan (AVAPRO) 300 MG tablet Take 300 mg by mouth daily. 06/12/17   [provider]  lamoTRIgine (LAMICTAL) 100 MG tablet Take 1 tablet (100 mg total) by mouth 2 (two) times daily. 06/23/21   Marcial Pacas, MD   magnesium oxide (MAG-OX) 400 MG tablet Take 400 mg by mouth daily.    [provider]  OVER THE COUNTER MEDICATION Take 10 mg by mouth at bedtime. Tumeric Black pepper 5 mg    [provider]  Probiotic Product (PROBIOTIC PO) Take 1 capsule by mouth daily.    [provider]  rosuvastatin (CRESTOR) 10 MG tablet Take 10 mg by mouth daily. 06/18/21   [provider]    Physical Exam: Vitals:   10/18/21 0845 10/18/21 0846 10/18/21 0949 10/18/21 1000  BP:  (!) 181/84  (!) 173/79  Pulse:  89  88  Resp:  18  (!) 22  Temp:  97.7 F (36.5 C) 98.4 F (36.9 C)   TempSrc:   Rectal   SpO2:  95%  94%  Weight: 77.1 kg     Height: 5\' 1"  (1.549 m)       General: 70 y.o. female resting in bed in NAD Eyes: PERRL, normal sclera ENMT: Nares patent w/o discharge, orophaynx clear, dentition normal, ears w/o discharge/lesions/ulcers Neck: Supple, trachea midline Cardiovascular: RRR, +S1, S2, no m/g/r, equal pulses throughout Respiratory: CTABL, no w/r/r, normal WOB GI: BS+, NDNT, no masses noted, no organomegaly noted MSK: No e/c/c Neuro: A&O x 3 (name, year, president), no focal deficits; she is generally weak Psyc: Slow interaction and flat  affect, calm/cooperative  Labs on Admission: I have personally reviewed following labs and imaging studies  CBC: Recent Labs  Lab 10/18/21 0848  WBC 8.3  NEUTROABS 6.1  HGB 11.7*  HCT 32.7*  MCV 89.3  PLT 950   Basic Metabolic Panel: Recent Labs  Lab 10/18/21 0848  NA 144  K 2.0*  CL 100  CO2 34*  GLUCOSE 107*  BUN 17  CREATININE 1.39*  CALCIUM 13.4*   GFR: Estimated Creatinine Clearance: 34.9 mL/min (A) (by C-G formula based on SCr of 1.39 mg/dL (H)). Liver Function Tests: Recent Labs  Lab 10/18/21 0848  AST 25  ALT 15  ALKPHOS 37*  BILITOT 1.7*  PROT 7.4  ALBUMIN 4.5   No results for input(s): LIPASE, AMYLASE in the last 168 hours. No results for input(s): AMMONIA in the last 168  hours. Coagulation Profile: No results for input(s): INR, PROTIME in the last 168 hours. Cardiac Enzymes: No results for input(s): CKTOTAL, CKMB, CKMBINDEX, TROPONINI in the last 168 hours. BNP (last 3 results) No results for input(s): PROBNP in the last 8760 hours. HbA1C: No results for input(s): HGBA1C  in the last 72 hours. CBG: Recent Labs  Lab 10/18/21 0858  GLUCAP 98   Lipid Profile: No results for input(s): CHOL, HDL, LDLCALC, TRIG, CHOLHDL, LDLDIRECT in the last 72 hours. Thyroid Function Tests: No results for input(s): TSH, T4TOTAL, FREET4, T3FREE, THYROIDAB in the last 72 hours. Anemia Panel: No results for input(s): VITAMINB12, FOLATE, FERRITIN, TIBC, IRON, RETICCTPCT in the last 72 hours. Urine analysis:    Component Value Date/Time   COLORURINE YELLOW 10/18/2021 0926   APPEARANCEUR HAZY (A) 10/18/2021 0926   LABSPEC 1.011 10/18/2021 0926   PHURINE 6.0 10/18/2021 0926   GLUCOSEU NEGATIVE 10/18/2021 0926   HGBUR NEGATIVE 10/18/2021 0926   BILIRUBINUR NEGATIVE 10/18/2021 0926   KETONESUR NEGATIVE 10/18/2021 0926   PROTEINUR NEGATIVE 10/18/2021 0926   UROBILINOGEN 0.2 02/20/2009 1445   NITRITE POSITIVE (A) 10/18/2021 0926   LEUKOCYTESUR LARGE (A) 10/18/2021 0926    Radiological Exams on Admission: CT Head Wo Contrast  Result Date: 10/18/2021 CLINICAL DATA:  Confusion, weakness, head injury. EXAM: CT HEAD WITHOUT CONTRAST TECHNIQUE: Contiguous axial images were obtained from the base of the skull through the vertex without intravenous contrast. COMPARISON:  October 11, 2017. FINDINGS: Brain: Mild chronic ischemic white matter disease is noted. No mass effect or midline shift is noted. Ventricular size is within normal limits. There is no evidence of mass lesion, hemorrhage or acute infarction. Vascular: No hyperdense vessel or unexpected calcification. Skull: Normal. Negative for fracture or focal lesion. Sinuses/Orbits: No acute finding. Other: None. IMPRESSION: No  acute intracranial abnormality seen. Electronically Signed   By: Marijo Conception M.D.   On: 10/18/2021 09:51   DG Chest Port 1 View  Result Date: 10/18/2021 CLINICAL DATA:  Weakness EXAM: PORTABLE CHEST 1 VIEW COMPARISON:  07/20/2021 FINDINGS: The heart size and mediastinal contours are within normal limits. Hiatal hernia. Both lungs are clear. The visualized skeletal structures are unremarkable. IMPRESSION: No active disease. Electronically Signed   By: Davina Poke D.O.   On: 10/18/2021 10:28    EKG: Independently reviewed. Sinus, no st elevations, prolonged qt  Assessment/Plan Hypercalcemia     - admit to inpt, progressive     - fluids     - check PTH, ionized Ca2+, vit D level  Hypokalemia     - replace K+; check Mg2+     - tele  Generalized weakness     - likely as a function of above and poor PO intake     - encourage diet; have dietician see     - PT/OT eval     - also check CPK  UTI     - follow UCx; continue rocephin  HTN     - resume home hydralazine; hold her ARB d/t AKI  HLD     - hold statin pending CPK  Dementia     - resume home regimen  AKI     - likely d/t dehydration     - check renal US     - fluids     - hold ARB; watch nephrotoxins  Prolonged Qt     - correct lytes, follow EKG in AM  Elevated troponin     - no chest pain     - trp is flat     - EKG as above; follow  DVT prophylaxis: SCDs  Code Status: DNR  Family Communication: w/ husband at bedside  Consults called: None   Status is: Inpatient  Remains inpatient appropriate because: severity of illness  Jonnie Finner DO [facility]  If 7PM-7AM, please contact night-coverage [email or web address]  10/18/2021, 11:00 AM

## 2021-10-18 NOTE — Progress Notes (Signed)
   10/18/21 1707  Provider Notification  Provider Name/Title Cherylann Ratel, MD  Date Provider Notified 10/18/21  Time Provider Notified [phone]  Notification Type Page  Notification Reason Critical result  Test performed and critical result K: 2.6  Date Critical Result Received 10/18/21  Time Critical Result Received 1705  Provider response Other (Comment) (confirmed IV & PO K as scheduled should provide needed results)  Date of Provider Response 10/18/21  Time of Provider Response 1718

## 2021-10-18 NOTE — ED Triage Notes (Addendum)
Pt BIB ems from home c/o weakness x 2 days, normally ao x 4, only oriented to person and place at this time. Foul odor urine noted per EMS. Per EMS, pt has "fever" by touch and Temp did not read. Pt lives with husband. Pt denies SOB or chest pain. Afebrile on ER arrival.  VS per ems pta: 190/80 HR 88 SPO2 91% RA CBG 140

## 2021-10-18 NOTE — ED Provider Notes (Signed)
[facility] DEPT Provider Note   CSN: [id number] Arrival date & time: 10/18/21  3267     History Chief Complaint  Patient presents with   Weakness    Sarah Cook is a 70 y.o. female.  Patient is a 70 year old female with a history of hypertension, memory loss, gait disturbance, prior TIA, diastolic CHF and parathyroid disorder who is presenting today with her husband due to worsening weakness and confusion over the last 2 days.  Has been reported she was doing well on Saturday and Sunday is when he started noticing a change.  She has had a very poor appetite and is having more more difficulty walking to the point where he was barely able to get her into bed last night.  She occasionally complains of a headache and some pain in her back but he denies any cough, congestion, vomiting, diarrhea.  He did notice she did not urinate overnight which is very unusual for her.  She has had no known sick contacts and he is not aware of her running a fever.  She has had no recent change in her medications.  He did report that about 2 weeks ago she had a fall with her walker and she did not present for evaluation as she seemed to be okay and he does not think she hit her head.  She does not take any anticoagulation.  He has not noticed any unilateral weakness.  The history is provided by the spouse, medical records and the patient.  Weakness     Past Medical History:  Diagnosis Date   Allergy    Anxiety    Breast cancer ([place])    Cataract    early signs of   Gait abnormality    Heart disease    Hemorrhoids    Hiatal hernia 11/07/2012   large   Hyperlipidemia    Hyperparathyroidism ([place])    Hypertension    Memory loss    Osteoporosis    Parathyroid disorder ([place])    Pulmonary nodules    TIA (transient ischemic attack)    Urinary incontinence     Patient Active Problem List   Diagnosis Date Noted   Dementia without behavioral disturbance ([place])  07/22/2021   Hypertensive urgency 07/21/2021   Hypokalemia 07/20/2021   Generalized muscle weakness 07/20/2021   Encephalomalacia 07/20/2021   Urinary incontinence 07/20/2021   Hypophosphatemia 07/20/2021   Memory loss 06/23/2021   Confusion 06/23/2021   Encephalopathy acute 10/18/2017   Post-concussion headache 10/18/2017   Altered mental status 12/45/8099   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[place]) 03/09/2016   Chest pain 02-07-16   History of antineoplastic chemotherapy with cardiotoxic drugs 02-07-2016   Family history of sudden cardiac death in father 02/07/16   Breast cancer [facility])    Hemorrhoids    Hiatal hernia    Pulmonary nodules    Hyperparathyroidism ([place])    Hypertension    Hyperlipidemia    Heart disease    Allergy    Anxiety    TIA (transient ischemic attack)    Cataract    Osteoporosis    Parathyroid disorder ([place])    Irritable bowel syndrome 12/11/2013   Headache(784.0) 12/10/2013    Past Surgical History:  Procedure Laterality Date   BLADDER SUSPENSION     CESAREAN SECTION     x 2   CHOLECYSTECTOMY     MASTECTOMY Right    PARATHYROIDECTOMY     TOTAL ELBOW REPLACEMENT  TUBAL LIGATION       OB History   No obstetric history on file.     Family History  Problem Relation Age of Onset   Breast cancer Mother    Sudden death Father    Heart failure Father    Hypertension Other    Heart disease Other    Colon cancer Neg Hx    Esophageal cancer Neg Hx    Stomach cancer Neg Hx    Rectal cancer Neg Hx     Social History   Tobacco Use   Smoking status: Never   Smokeless tobacco: Never  Vaping Use   Vaping Use: Never used  Substance Use Topics   Alcohol use: No   Drug use: No    Home Medications Prior to Admission medications   Medication Sig Start Date End Date Taking? Authorizing Provider  acetaminophen (TYLENOL) 650 MG CR tablet Take 1,300 mg by mouth in the morning and at bedtime.    [provider]   amLODipine (NORVASC) 10 MG tablet Take 1 tablet (10 mg total) by mouth daily. 07/28/21   Hongalgi, Lenis Dickinson, MD  Ascorbic Acid (VITAMIN C PO) Take 1 tablet by mouth daily.    [provider]  Cholecalciferol (VITAMIN D3) 2000 units TABS Take 2,000 Units by mouth daily.     [provider]  CINNAMON PO Take 1 tablet by mouth daily.    [provider]  Coenzyme Q10 (CO Q 10 PO) Take 1 tablet by mouth daily.    [provider]  docusate sodium (COLACE) 100 MG capsule Take 100 mg by mouth daily.    [provider]  hydrALAZINE (APRESOLINE) 25 MG tablet Take 1.5 tablets (37.5 mg total) by mouth 3 (three) times daily. 07/28/21   Hongalgi, Lenis Dickinson, MD  irbesartan (AVAPRO) 300 MG tablet Take 300 mg by mouth daily. 06/12/17   [provider]  lamoTRIgine (LAMICTAL) 100 MG tablet Take 1 tablet (100 mg total) by mouth 2 (two) times daily. 06/23/21   Marcial Pacas, MD  magnesium oxide (MAG-OX) 400 MG tablet Take 400 mg by mouth daily.    [provider]  OVER THE COUNTER MEDICATION Take 2 capsules by mouth at bedtime. Tumeric Black pepper 5mg  tab take 1 tab by mouth daily    [provider]  Probiotic Product (PROBIOTIC PO) Take 1 capsule by mouth daily. Takes a Secretary/administrator, Historical, MD  rosuvastatin (CRESTOR) 10 MG tablet Take 10 mg by mouth daily. 06/18/21   [provider]    Allergies    Anesthetics, ester; Codeine; Tape; Hydrochlorothiazide; Onion; and Latex  Review of Systems   Review of Systems  Unable to perform ROS: Dementia  Neurological:  Positive for weakness.   Physical Exam Updated Vital Signs BP (!) 181/84 (BP Location: Left Arm)   Pulse 89   Temp 97.7 F (36.5 C)   Resp 18   Ht 5\' 1"  (1.549 m)   Wt 77.1 kg   SpO2 95%   BMI 32.12 kg/m   Physical Exam Vitals and nursing note reviewed.  Constitutional:      General: She is not in acute distress.    Appearance: She is well-developed.  HENT:      Head: Normocephalic and atraumatic.     Mouth/Throat:     Mouth: Mucous membranes are dry.  Eyes:     Pupils: Pupils are equal, round, and reactive to light.  Cardiovascular:  Rate and Rhythm: Normal rate and regular rhythm.     Heart sounds: Normal heart sounds. No murmur heard.   No friction rub.  Pulmonary:     Effort: Pulmonary effort is normal.     Breath sounds: Normal breath sounds. No wheezing or rales.  Abdominal:     General: Bowel sounds are normal. There is no distension.     Palpations: Abdomen is soft.     Tenderness: There is no abdominal tenderness. There is no guarding or rebound.  Musculoskeletal:        General: No tenderness. Normal range of motion.     Cervical back: Normal range of motion and neck supple.     Right lower leg: No edema.     Left lower leg: No edema.     Comments: No edema  Skin:    General: Skin is warm and dry.     Findings: No rash.  Neurological:     Mental Status: She is alert. Mental status is at baseline.     Cranial Nerves: No cranial nerve deficit.     Sensory: No sensory deficit.     Motor: No weakness.     Comments: Patient is able to sit up on her own and she can lift each leg independently with no notable weakness.  She was not ambulated due to history of having difficulty in the last 2 days.  Oriented to self    ED Results / Procedures / Treatments   Labs (all labs ordered are listed, but only abnormal results are displayed) Labs Reviewed  URINALYSIS, ROUTINE W REFLEX MICROSCOPIC - Abnormal; Notable for the following components:      Result Value   APPearance HAZY (*)    Nitrite POSITIVE (*)    Leukocytes,Ua LARGE (*)    Bacteria, UA RARE (*)    All other components within normal limits  CBC WITH DIFFERENTIAL/PLATELET - Abnormal; Notable for the following components:   RBC 3.66 (*)    Hemoglobin 11.7 (*)    HCT 32.7 (*)    All other components within normal limits  COMPREHENSIVE METABOLIC PANEL - Abnormal; Notable  for the following components:   Potassium 2.0 (*)    CO2 34 (*)    Glucose, Bld 107 (*)    Creatinine, Ser 1.39 (*)    Calcium 13.4 (*)    Alkaline Phosphatase 37 (*)    Total Bilirubin 1.7 (*)    GFR, Estimated 41 (*)    All other components within normal limits  TROPONIN I (HIGH SENSITIVITY) - Abnormal; Notable for the following components:   Troponin I (High Sensitivity) 28 (*)    All other components within normal limits  RESP PANEL BY RT-PCR (FLU A&B, COVID) ARPGX2  LACTIC ACID, PLASMA  CBG MONITORING, ED    EKG EKG Interpretation  Date/Time:  Monday October 18 2021 08:47:20 EST Ventricular Rate:  89 PR Interval:    QRS Duration: 95 QT Interval:  451 QTC Calculation: 549 R Axis:   45 Text Interpretation: Sinus rhythm Borderline T abnormalities, diffuse leads Prolonged QT interval Artifact Confirmed by Blanchie Dessert [phone]) on 10/18/2021 10:25:38 AM  Radiology CT Head Wo Contrast  Result Date: 10/18/2021 CLINICAL DATA:  Confusion, weakness, head injury. EXAM: CT HEAD WITHOUT CONTRAST TECHNIQUE: Contiguous axial images were obtained from the base of the skull through the vertex without intravenous contrast. COMPARISON:  October 11, 2017. FINDINGS: Brain: Mild chronic ischemic white matter disease is noted. No mass effect or  midline shift is noted. Ventricular size is within normal limits. There is no evidence of mass lesion, hemorrhage or acute infarction. Vascular: No hyperdense vessel or unexpected calcification. Skull: Normal. Negative for fracture or focal lesion. Sinuses/Orbits: No acute finding. Other: None. IMPRESSION: No acute intracranial abnormality seen. Electronically Signed   By: Marijo Conception M.D.   On: 10/18/2021 09:51    Procedures Procedures   Medications Ordered in ED Medications  lactated ringers bolus 1,000 mL (has no administration in time range)    ED Course  I have reviewed the triage vital signs and the nursing notes.  Pertinent labs &  imaging results that were available during my care of the patient were reviewed by me and considered in my medical decision making (see chart for details).    MDM Rules/Calculators/A&P                           Elderly female presenting today with weakness and altered mental status for the last 2 days.  Concern for possible infectious etiology versus acute intracranial process.  Patient did have a fall a few weeks ago but does not take any anticoagulation.  Concern for possible expanding subdural.  She has no focal findings on exam to suggest stroke.  Also concern for possible dehydration, electrolyte abnormality given patient's prior parathyroid disease AKI.  Patient given IV fluids, no evidence of CHF exacerbation at this time.  Labs and imaging are pending.  10:18 AM CBC within normal limits, CMP today with evidence of hypercalcemia, hypokalemia.  Potassium today of 2 and calcium of 13.  Creatinine of 1.39 from baseline of 0.5.  Patient's urine today nitrite and leukocyte positive with many white blood cells.  Patient is already receiving IV fluids which will help with hyperkalemia.  Potassium was replaced.  She was given Rocephin for suspected UTI.  Rectal temperature is negative.  Head CT is negative for evidence of trauma and lactic acid is within normal limits.  Patient's troponin is 28 but no acute findings on EKG.  Plan will be for admission and further care.  No evidence of sepsis at this time  MDM   Amount and/or Complexity of Data Reviewed Clinical lab tests: ordered and reviewed Tests in the radiology section of CPT: reviewed and ordered Tests in the medicine section of CPT: ordered and reviewed Independent visualization of images, tracings, or specimens: yes   CRITICAL CARE Performed by: Addy Mcmannis Total critical care time: 30 minutes Critical care time was exclusive of separately billable procedures and treating other patients. Critical care was necessary to treat or  prevent imminent or life-threatening deterioration. Critical care was time spent personally by me on the following activities: development of treatment plan with patient and/or surrogate as well as nursing, discussions with consultants, evaluation of patient's response to treatment, examination of patient, obtaining history from patient or surrogate, ordering and performing treatments and interventions, ordering and review of laboratory studies, ordering and review of radiographic studies, pulse oximetry and re-evaluation of patient's condition.   Final Clinical Impression(s) / ED Diagnoses Final diagnoses:  Weakness  Hypokalemia  Hypercalcemia  AKI (acute kidney injury) ([place])  Acute cystitis without hematuria    Rx / DC Orders ED Discharge Orders     None        Blanchie Dessert, MD 10/18/21 1026

## 2021-10-19 DIAGNOSIS — B9620 Unspecified Escherichia coli [E. coli] as the cause of diseases classified elsewhere: Secondary | ICD-10-CM | POA: Diagnosis present

## 2021-10-19 LAB — RENAL FUNCTION PANEL
Albumin: 3.5 g/dL (ref 3.5–5.0)
Anion gap: 7 (ref 5–15)
BUN: 11 mg/dL (ref 8–23)
CO2: 30 mmol/L (ref 22–32)
Calcium: 10.7 mg/dL — ABNORMAL HIGH (ref 8.9–10.3)
Chloride: 106 mmol/L (ref 98–111)
Creatinine, Ser: 1.17 mg/dL — ABNORMAL HIGH (ref 0.44–1.00)
GFR, Estimated: 50 mL/min — ABNORMAL LOW (ref >60)
Glucose, Bld: 100 mg/dL — ABNORMAL HIGH (ref 70–99)
Phosphorus: 2.6 mg/dL (ref 2.5–4.6)
Potassium: 2.4 mmol/L — CL (ref 3.5–5.1)
Sodium: 143 mmol/L (ref 135–145)

## 2021-10-19 LAB — COMPREHENSIVE METABOLIC PANEL
ALT: 14 U/L (ref 0–44)
AST: 23 U/L (ref 15–41)
Albumin: 3.5 g/dL (ref 3.5–5.0)
Alkaline Phosphatase: 40 U/L (ref 38–126)
Anion gap: 8 (ref 5–15)
BUN: 11 mg/dL (ref 8–23)
CO2: 29 mmol/L (ref 22–32)
Calcium: 10.7 mg/dL — ABNORMAL HIGH (ref 8.9–10.3)
Chloride: 106 mmol/L (ref 98–111)
Creatinine, Ser: 1.14 mg/dL — ABNORMAL HIGH (ref 0.44–1.00)
GFR, Estimated: 51 mL/min — ABNORMAL LOW (ref >60)
Glucose, Bld: 101 mg/dL — ABNORMAL HIGH (ref 70–99)
Potassium: 2.4 mmol/L — CL (ref 3.5–5.1)
Sodium: 143 mmol/L (ref 135–145)
Total Bilirubin: 1 mg/dL (ref 0.3–1.2)
Total Protein: 5.7 g/dL — ABNORMAL LOW (ref 6.5–8.1)

## 2021-10-19 LAB — CBC
HCT: 25.2 % — ABNORMAL LOW (ref 36.0–46.0)
Hemoglobin: 9.2 g/dL — ABNORMAL LOW (ref 12.0–15.0)
MCH: 32.5 pg (ref 26.0–34.0)
MCHC: 36.5 g/dL — ABNORMAL HIGH (ref 30.0–36.0)
MCV: 89 fL (ref 80.0–100.0)
Platelets: 212 10*3/uL (ref 150–400)
RBC: 2.83 MIL/uL — ABNORMAL LOW (ref 3.87–5.11)
RDW: 13 % (ref 11.5–15.5)
WBC: 6.5 10*3/uL (ref 4.0–10.5)
nRBC: 0 % (ref 0.0–0.2)

## 2021-10-19 LAB — POTASSIUM: Potassium: 2.8 mmol/L — ABNORMAL LOW (ref 3.5–5.1)

## 2021-10-19 LAB — MAGNESIUM: Magnesium: 1.6 mg/dL — ABNORMAL LOW (ref 1.7–2.4)

## 2021-10-19 MED ORDER — POTASSIUM CHLORIDE CRYS ER 20 MEQ PO TBCR
40.0000 meq | EXTENDED_RELEASE_TABLET | ORAL | Status: AC
  Administered 2021-10-19 – 2021-10-20 (×3): 40 meq via ORAL
  Filled 2021-10-19 (×3): qty 2

## 2021-10-19 MED ORDER — ADULT MULTIVITAMIN W/MINERALS CH
1.0000 | ORAL_TABLET | Freq: Every day | ORAL | Status: DC
  Administered 2021-10-19 – 2021-10-21 (×3): 1 via ORAL
  Filled 2021-10-19 (×3): qty 1

## 2021-10-19 MED ORDER — IRBESARTAN 150 MG PO TABS
150.0000 mg | ORAL_TABLET | Freq: Every day | ORAL | Status: DC
  Administered 2021-10-19 – 2021-10-21 (×3): 150 mg via ORAL
  Filled 2021-10-19 (×3): qty 1

## 2021-10-19 MED ORDER — ROSUVASTATIN CALCIUM 10 MG PO TABS
10.0000 mg | ORAL_TABLET | Freq: Every day | ORAL | Status: DC
  Administered 2021-10-19 – 2021-10-21 (×3): 10 mg via ORAL
  Filled 2021-10-19 (×3): qty 1

## 2021-10-19 MED ORDER — ENSURE ENLIVE PO LIQD
237.0000 mL | Freq: Two times a day (BID) | ORAL | Status: DC
  Administered 2021-10-19 – 2021-10-21 (×4): 237 mL via ORAL

## 2021-10-19 MED ORDER — POTASSIUM CHLORIDE CRYS ER 20 MEQ PO TBCR
40.0000 meq | EXTENDED_RELEASE_TABLET | ORAL | Status: AC
  Administered 2021-10-19 (×3): 40 meq via ORAL
  Filled 2021-10-19 (×3): qty 2

## 2021-10-19 MED ORDER — MAGNESIUM SULFATE 2 GM/50ML IV SOLN
2.0000 g | Freq: Once | INTRAVENOUS | Status: AC
  Administered 2021-10-19: 2 g via INTRAVENOUS
  Filled 2021-10-19: qty 50

## 2021-10-19 MED ORDER — POTASSIUM CHLORIDE CRYS ER 20 MEQ PO TBCR: 40.0000 meq | EXTENDED_RELEASE_TABLET | Freq: Once | ORAL | Status: DC

## 2021-10-19 NOTE — Evaluation (Signed)
Physical Therapy Evaluation Patient Details Name: Sarah Cook MRN: [id number] DOB: 05-Jun-1951 Today's Date: 10/19/2021  History of Present Illness  70 yo female admitted with hypercalcemia, hypokalemia, weakness, worsened confusion. Hx of dementia, anxiety, incontinence, TIA, breat ca, gait abnormality, CHF  Clinical Impression  On eval, pt required Mod A +2 for bed mobility. Pt sat EOB with Mod A for static sitting balance. Pt presents with general weakness, decreased activity tolerance, and impaired gait and balance. Pt is A&O x 1. She follows 1 step commands inconsistently. No family present during session. Recommend SNF for continued rehab.        Recommendations for follow up therapy are one component of a multi-disciplinary discharge planning process, led by the attending physician.  Recommendations may be updated based on patient status, additional functional criteria and insurance authorization.  Follow Up Recommendations Skilled nursing-short term rehab (<3 hours/day)    Assistance Recommended at Discharge Frequent or constant Supervision/Assistance  Functional Status Assessment Patient has had a recent decline in their functional status and demonstrates the ability to make significant improvements in function in a reasonable and predictable amount of time.  Equipment Recommendations  None recommended by PT    Recommendations for Other Services       Precautions / Restrictions Precautions Precautions: Fall Precaution Comments: incontinent Restrictions Weight Bearing Restrictions: No      Mobility  Bed Mobility Overal bed mobility: Needs Assistance Bed Mobility: Supine to Sit;Sit to Supine     Supine to sit: Mod assist;+2 for physical assistance;+2 for safety/equipment;HOB elevated Sit to supine: Mod assist;+2 for physical assistance;+2 for safety/equipment;HOB elevated   General bed mobility comments: Assist for trunk and bil LEs. Utilized bedpad to aid with  scooting, positoning. Poor sitting balance with heavy L side lean. Fall risk.    Transfers                   General transfer comment: Nt-unable to safely attempt due to poor sitting balance/fall risk    Ambulation/Gait                  Stairs            Wheelchair Mobility    Modified Rankin (Stroke Patients Only)       Balance Overall balance assessment: Needs assistance Sitting-balance support: Feet supported Sitting balance-Leahy Scale: Poor Sitting balance - Comments: Heavy L side leaning with flexed trunk posture. Mod A required.                                     Pertinent Vitals/Pain Pain Assessment: Faces Faces Pain Scale: No hurt    Home Living Family/patient expects to be discharged to:: Private residence Living Arrangements: Spouse/significant other Available Help at Discharge: Family;Personal care attendant Type of Home: House           Home Equipment: Conservation officer, nature (2 wheels);Cane - single point;Shower seat;Wheelchair - manual      Prior Function Prior Level of Function : Needs assist             Mobility Comments: from previous admission: ambulatory with RW ADLs Comments: assist required     Hand Dominance        Extremity/Trunk Assessment   Upper Extremity Assessment Upper Extremity Assessment: Defer to OT evaluation    Lower Extremity Assessment Lower Extremity Assessment: Generalized weakness    Cervical / Trunk Assessment Cervical /  Trunk Assessment: Kyphotic  Communication   Communication: No difficulties  Cognition Arousal/Alertness: Awake/alert Behavior During Therapy: WFL for tasks assessed/performed Overall Cognitive Status: History of cognitive impairments - at baseline                                 General Comments: A&Ox 1.  Follows 1 step commands inconsistently        General Comments      Exercises     Assessment/Plan    PT Assessment Patient  needs continued PT services  PT Problem List Decreased mobility;Decreased strength;Decreased activity tolerance;Decreased balance;Decreased knowledge of use of DME       PT Treatment Interventions Therapeutic activities;DME instruction;Gait training;Therapeutic exercise;Patient/family education;Balance training;Functional mobility training    PT Goals (Current goals can be found in the Care Plan section)  Acute Rehab PT Goals Patient Stated Goal: pt unable to state. no family present PT Goal Formulation: Patient unable to participate in goal setting Time For Goal Achievement: 11/02/21 Potential to Achieve Goals: Fair    Frequency Min 3X/week   Barriers to discharge        Co-evaluation               AM-PAC PT "6 Clicks" Mobility  Outcome Measure Help needed turning from your back to your side while in a flat bed without using bedrails?: Total Help needed moving from lying on your back to sitting on the side of a flat bed without using bedrails?: Total Help needed moving to and from a bed to a chair (including a wheelchair)?: Total Help needed standing up from a chair using your arms (e.g., wheelchair or bedside chair)?: Total Help needed to walk in hospital room?: Total Help needed climbing 3-5 steps with a railing? : Total 6 Click Score: 6    End of Session Equipment Utilized During Treatment: Gait belt Activity Tolerance: Patient tolerated treatment well Patient left: in bed;with call bell/phone within reach;with bed alarm set   PT Visit Diagnosis: Muscle weakness (generalized) (M62.81);Difficulty in walking, not elsewhere classified (R26.2);Other abnormalities of gait and mobility (R26.89)    Time: 3716-9678 PT Time Calculation (min) (ACUTE ONLY): 16 min   Charges:                Doreatha Massed, PT Acute Rehabilitation  Office: [phone] Pager: [phone]

## 2021-10-19 NOTE — Progress Notes (Signed)
OT Cancellation Note  Patient Details Name: Sarah Cook MRN: [id number] DOB: [birth year]   Cancelled Treatment:    Reason Eval/Treat Not Completed: Medical issues which prohibited therapy Patients potassium level is currently 2.4 with is out of scope for therapy. OT to continue to follow and check back as schedule will allow.  Jackelyn Poling OTR/L, [place] Acute Rehabilitation Department Office# [phone] Pager# [phone]   10/19/2021, 6:53 AM

## 2021-10-19 NOTE — NC FL2 (Signed)
[place] LEVEL OF CARE SCREENING TOOL     IDENTIFICATION  Patient Name: Sarah Cook Birthdate: 08/08/1951 Sex: female Admission Date (Current Location): 10/18/2021  [facility] and [place] Number:  Herbalist and Address:         Provider Number: [id number]  Attending Physician Name and Address:  [place] Eric J, DO  Relative Name and Phone Number:  Jonathan Kirkendoll spouse [phone]    Current Level of Care: Hospital Recommended Level of Care: [place] Prior Approval Number:    Date Approved/Denied:   PASRR Number: [id number]  Discharge Plan: SNF    Current Diagnoses: Patient Active Problem List   Diagnosis Date Noted   E-coli UTI 10/19/2021   Hypercalcemia 10/18/2021   Dementia without behavioral disturbance ([name]) 07/22/2021   Hypertensive urgency 07/21/2021   Hypokalemia 07/20/2021   Generalized muscle weakness 07/20/2021   Encephalomalacia 07/20/2021   Urinary incontinence 07/20/2021   Hypophosphatemia 07/20/2021   Memory loss 06/23/2021   Confusion 06/23/2021   Encephalopathy acute 10/18/2017   Post-concussion headache 10/18/2017   Altered mental status 87/86/7672   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[place]) 03/09/2016   Chest pain 2016-02-27   History of antineoplastic chemotherapy with cardiotoxic drugs February 27, 2016   Family history of sudden cardiac death in father Feb 27, 2016   Breast cancer ([place])    Hemorrhoids    Hiatal hernia    Pulmonary nodules    Hyperparathyroidism ([place])    Hypertension    Hyperlipidemia    Heart disease    Allergy    Anxiety    TIA (transient ischemic attack)    Cataract    Osteoporosis    Parathyroid disorder ([place])    Irritable bowel syndrome 12/11/2013   Headache(784.0) 12/10/2013    Orientation RESPIRATION BLADDER Height & Weight     Self  Normal Incontinent Weight: 77.1 kg Height:  5\' 1"  (154.9 cm)  BEHAVIORAL SYMPTOMS/MOOD NEUROLOGICAL BOWEL NUTRITION  STATUS      Incontinent Diet (Regular)  AMBULATORY STATUS COMMUNICATION OF NEEDS Skin   Limited Assist Verbally Normal                       Personal Care Assistance Level of Assistance  Bathing, Feeding, Dressing Bathing Assistance: Limited assistance Feeding assistance: Limited assistance Dressing Assistance: Limited assistance     Functional Limitations Info  Sight, Hearing, Speech Sight Info: Adequate Hearing Info: Adequate Speech Info: Adequate    SPECIAL CARE FACTORS FREQUENCY  PT (By licensed PT), OT (By licensed OT)     PT Frequency:  (5x week) OT Frequency:  (5x week)            Contractures Contractures Info: Not present    Additional Factors Info  Code Status, Allergies Code Status Info:  (DNR) Allergies Info:  (Anesthetics, Ester, Codeine, Tape, Amlodipine, Cetirizine Hcl, Hydrochlorothiazide, Hydrocodone, Onion, Latex)           Current Medications (10/19/2021):  This is the current hospital active medication list Current Facility-Administered Medications  Medication Dose Route Frequency Provider Last Rate Last Admin   0.9 %  sodium chloride infusion   Intravenous Continuous [place] Eric J, DO 125 mL/hr at 10/19/21 0807 New Bag at 10/19/21 0947   ascorbic acid (VITAMIN C) tablet 250 mg  250 mg Oral Daily Marylyn Ishihara, Tyrone A, DO   250 mg at 10/19/21 0962   cefTRIAXone (ROCEPHIN) 1 g in sodium chloride 0.9 % 100 mL IVPB  1 g Intravenous Q24H Marylyn Ishihara, Tyrone A, DO       docusate sodium (COLACE) capsule 100 mg  100 mg Oral Daily Marylyn Ishihara, Tyrone A, DO   100 mg at 10/19/21 3810   feeding supplement (ENSURE ENLIVE / ENSURE PLUS) liquid 237 mL  237 mL Oral BID BM [place] Eric J, DO       hydrALAZINE (APRESOLINE) tablet 37.5 mg  37.5 mg Oral TID Marylyn Ishihara, Tyrone A, DO   37.5 mg at 10/19/21 0809   irbesartan (AVAPRO) tablet 150 mg  150 mg Oral Daily [place] Donnamarie Poag, DO       lamoTRIgine (LAMICTAL) tablet 100 mg  100 mg Oral BID Marylyn Ishihara, Tyrone A, DO   100 mg at 10/19/21  1751   magnesium oxide (MAG-OX) tablet 400 mg  400 mg Oral Daily Marylyn Ishihara, Tyrone A, DO   400 mg at 10/19/21 0258   magnesium sulfate IVPB 2 g 50 mL  2 g Intravenous Once [place] Eric J, DO       metoprolol tartrate (LOPRESSOR) injection 5 mg  5 mg Intravenous Q6H PRN Marylyn Ishihara, Tyrone A, DO       multivitamin with minerals tablet 1 tablet  1 tablet Oral Daily [place] Eric J, DO       pneumococcal 23 valent vaccine (PNEUMOVAX-23) injection 0.5 mL  0.5 mL Intramuscular Tomorrow-1000 Kyle, Tyrone A, DO       potassium chloride SA (KLOR-CON M) CR tablet 40 mEq  40 mEq Oral Q3H [place] Eric J, DO   40 mEq at 10/19/21 0810   rosuvastatin (CRESTOR) tablet 10 mg  10 mg Oral Daily [place] Eric J, DO         Discharge Medications: Please see discharge summary for a list of discharge medications.  Relevant Imaging Results:  Relevant Lab Results:   Additional Information SS#[id number];[employer] x2  Dessa Phi, RN

## 2021-10-19 NOTE — TOC Progression Note (Signed)
Transition of Care [facility]) - Progression Note    Patient Details  Name: Sarah Cook MRN: [id number] Date of Birth: 05/24/1951  [facility]) CM/SW Contact  Baley Lorimer, Juliann Pulse, RN Phone Number: 10/19/2021, 3:38 PM  Clinical Narrative:   Faxed out per agreement from spouse [name]-await bed offers.    Expected Discharge Plan: Skilled Nursing Facility Barriers to Discharge: Continued Medical Work up  Expected Discharge Plan and Services Expected Discharge Plan: [place]                                               Social Determinants of Health (SDOH) Interventions    Readmission Risk Interventions No flowsheet data found.

## 2021-10-19 NOTE — Progress Notes (Signed)
Initial Nutrition Assessment  DOCUMENTATION CODES:   Obesity unspecified  INTERVENTION:   -Ensure Enlive po BID, each supplement provides 350 kcal and 20 grams of protein   -Multivitamin with minerals daily  -Needs updated weight for this admission  NUTRITION DIAGNOSIS:   Inadequate oral intake related to lethargy/confusion as evidenced by per patient/family report.  GOAL:   Patient will meet greater than or equal to 90% of their needs  MONITOR:   Supplement acceptance, PO intake, Labs, Weight trends, I & O's  REASON FOR ASSESSMENT:   Consult Assessment of nutrition requirement/status  ASSESSMENT:   70 y.o. female with medical history significant of demential, HTN, anxiety, parathyroid disease. Presenting with generalize weakness.  Patient in room, sitting flat in bed. Tech about to change pt. Pt visible shaking, states this sometimes happens to her at home as well. Pt reports not having trouble eating. Not a great historian, alert/oriented x 1. Per chart review, pt was having poor appetite for 5 days PTA.   Per tech in room, another lunch tray is being delivered for patient that contains grilled cheese.  Will order Ensure supplements.  Per weight records, no weight has been measured for this admission.  Medications: Vitamin C, Colace, MAG-OX, KLOR-CON, IV Mg Sulfate  Labs reviewed:  Low K, Mg   NUTRITION - FOCUSED PHYSICAL EXAM:  No depletions noted. Pt with active tremors.  Diet Order:   Diet Order             Diet regular Room service appropriate? Yes; Fluid consistency: Thin  Diet effective now                   EDUCATION NEEDS:   No education needs have been identified at this time  Skin:  Skin Assessment: Reviewed RN Assessment  Last BM:  PTA  Height:   Ht Readings from Last 1 Encounters:  10/18/21 5\' 1"  (1.549 m)    Weight:   Wt Readings from Last 1 Encounters:  10/18/21 77.1 kg    BMI:  Body mass index is 32.12  kg/m.  Estimated Nutritional Needs:   Kcal:  1450-1650  Protein:  65-75g  Fluid:  1.7L/day  Clayton Bibles, MS, RD, LDN Inpatient Clinical Dietitian Contact information available via Amion

## 2021-10-19 NOTE — Progress Notes (Signed)
PROGRESS NOTE    Sarah Cook  KGM:[id number] DOB: Mar 11, 1951 DOA: 10/18/2021 PCP: Cari Caraway, MD    Brief Narrative:  Sarah Cook is a 70 year old female with past medical history significant for dementia, essential hypertension, anxiety, parathyroid disease, who presented to [facility] ED on 12/12 with generalized weakness.  History obtained from husband who was present at bedside.  He reports her appetite has been poor over the last 5 days; just does not want to eat.  Over this timeframe, she has been becoming generally more weaker with difficulty getting her in and out of bed.  Patient denies any pain, no nausea/vomiting.  Given her decline, husband decided to take the patient to the hospital for further evaluation.  In the ED, temperature 97.7 F, HR 89, RR 18, BP 181/84, SPO2 95% on room air.  Sodium 144, potassium 2.0, chloride 100, CO2 34, glucose 107, BUN 17, creatinine 1.39, calcium elevated at 13.4, anion gap 10, AST 25, ALT 15, total bilirubin 1.7.  Lactic acid 0.7.  High sensitive troponin 28.  WBC 8.3, hemoglobin 11.7, platelets 287.  COVID-19 PCR negative.  Influenza A/B PCR negative.  Urinalysis with large leukocytes, positive nitrite, rare bacteria, 21-50 WBCs.  Chest x-ray with no acute cardiopulmonary disease process.  CT head without contrast with no acute intracranial abnormality.  Renal ultrasound with no evidence of hydronephrosis, trabeculated bladder which can be seen in the setting of chronic bladder dysfunction.  Hospital service consulted for further evaluation and management of generalized weakness in the setting of hyper calcium Mia, acute renal failure, UTI, hypokalemia likely secondary to dehydration and adult failure to thrive.   Assessment & Plan:   Principal Problem:   Hypercalcemia Active Problems:   Hypertension   Hyperlipidemia   Hypokalemia   Generalized muscle weakness   Hypertensive urgency   Dementia without behavioral disturbance (HCC)   E-coli  UTI   E. coli UTI Urinalysis with large leukocytes, positive nitrite, rare bacteria, 21-50 WBCs. --Urine culture: >100K Ecoli; susceptibilities --Ceftriaxone 1 g IV every 24 hours  Hypercalcemia: Resolved Calcium elevated at 13.4 on admission.  Etiology likely secondary to severe dehydration from poor oral intake.  Received IV fluid bolus followed by IV fluid hydration with improvement of calcium to 10.7. --BMP daily --Monitor on telemetry  Hypokalemia Potassium 2.0 on admission, repleted.  Up to 2.4 this morning. --Will replete aggressively this morning, recheck potassium level this evening --Follow electrolytes daily to include magnesium --Monitor on telemetry  Hypomagnesemia Magnesium 1.6 this morning --Magnesium 2 g IV x1 today --Magnesium oxide 400 mg p.o. daily --Repeat magnesium level in a.m.  Acute renal failure Baseline creatinine 0.6-0.8.  On admission, creatinine 1.39, likely secondary to prerenal azotemia in the setting of dehydration. --Continue IVF hydration with NS at 75 mL/h --Restart irbesartan at half dose 150 mg p.o. daily --Repeat BMP in a.m.  Hypertensive urgency --Continue home hydralazine 37.5 mg p.o. 3 times daily --Restart home irbesartan at reduced dose 150 mg p.o. daily (on 300mg  daily) --Continue monitor BP closely  Hyperlipidemia: Crestor 10 mg p.o. daily  Anxiety/depression Dementia without behavioral disturbance --Lamictal 100 mg p.o. twice daily --Delirium precautions --Get up during the day --Encourage a familiar face to remain present throughout the day --Keep blinds open and lights on during daylight hours --Minimize the use of opioids/benzodiazepines  Weakness/deconditioning/debility: Etiology likely multifactorial in the setting of UTI, poor oral intake, electrolyte disturbance.  Patient lives at home with husband. --PT/OT evaluation   DVT prophylaxis: SCDs  Start: 10/18/21 1316   Code Status: DNR Family Communication: Updated  patient's spouse, [name] via telephone this morning  Disposition Plan:  Level of care: Telemetry Status is: Inpatient  Remains inpatient appropriate because: IV fluid hydration, continues to require electrolyte replacement for hypokalemia/hypomagnesemia, IV antibiotics pending urine culture susceptibilities, awaiting PT/OT evaluation   Consultants:  none  Procedures:  none  Antimicrobials:  Ceftriaxone 12/12>>   Subjective: Patient seen examined at bedside, resting comfortably.  No family present.  Pleasantly confused.  No specific complaints.  Continues with low potassium/low magnesium and will continue to replete.  Updated patient's spouse via telephone this morning.  Awaiting PT/OT evaluation.  Patient denies headache, no chest pain, no shortness of breath, no abdominal pain.  No acute events overnight per nursing staff.  Objective: Vitals:   10/18/21 1945 10/18/21 2344 10/19/21 0407 10/19/21 0804  BP: (!) 173/85 (!) 158/68 (!) 167/71 (!) 178/75  Pulse: 97 87 90 91  Resp: 17 18 17    Temp: 97.9 F (36.6 C) 97.7 F (36.5 C) 98.7 F (37.1 C)   TempSrc: Oral Oral Oral   SpO2: 95% 97% 99%   Weight:      Height:        Intake/Output Summary (Last 24 hours) at 10/19/2021 1142 Last data filed at 10/19/2021 5956 Gross per 24 hour  Intake 4033.98 ml  Output 1950 ml  Net 2083.98 ml   Filed Weights   10/18/21 0845  Weight: 77.1 kg    Examination:  General exam: Appears calm and comfortable, pleasantly confused Respiratory system: Clear to auscultation. Respiratory effort normal.  On room air Cardiovascular system: S1 & S2 heard, RRR. No JVD, murmurs, rubs, gallops or clicks. No pedal edema. Gastrointestinal system: Abdomen is nondistended, soft and nontender. No organomegaly or masses felt. Normal bowel sounds heard. Central nervous system: Alert, not oriented to person/place/time. No focal neurological deficits. Extremities: Symmetric 5 x 5 power. Skin: No rashes,  lesions or ulcers Psychiatry: Judgement and insight appear poor. Mood & affect appropriate.     Data Reviewed: I have personally reviewed following labs and imaging studies  CBC: Recent Labs  Lab 10/18/21 0848 10/19/21 0503  WBC 8.3 6.5  NEUTROABS 6.1  --   HGB 11.7* 9.2*  HCT 32.7* 25.2*  MCV 89.3 89.0  PLT 287 387   Basic Metabolic Panel: Recent Labs  Lab 10/18/21 0848 10/18/21 1126 10/18/21 1609 10/19/21 0503  NA 144  --  145 143   143  K 2.0*  --  2.6* 2.4*   2.4*  CL 100  --  104 106   106  CO2 34*  --  29 30   29   GLUCOSE 107*  --  96 100*   101*  BUN 17  --  14 11   11   CREATININE 1.39*  --  1.26* 1.17*   1.14*  CALCIUM 13.4*  --  12.4* 10.7*   10.7*  MG  --  1.8  --  1.6*  PHOS  --   --  2.1* 2.6   GFR: Estimated Creatinine Clearance: 42.5 mL/min (A) (by C-G formula based on SCr of 1.14 mg/dL (H)). Liver Function Tests: Recent Labs  Lab 10/18/21 0848 10/18/21 1609 10/19/21 0503  AST 25  --  23  ALT 15  --  14  ALKPHOS 37*  --  40  BILITOT 1.7*  --  1.0  PROT 7.4  --  5.7*  ALBUMIN 4.5 4.2 3.5   3.5  No results for input(s): LIPASE, AMYLASE in the last 168 hours. No results for input(s): AMMONIA in the last 168 hours. Coagulation Profile: No results for input(s): INR, PROTIME in the last 168 hours. Cardiac Enzymes: No results for input(s): CKTOTAL, CKMB, CKMBINDEX, TROPONINI in the last 168 hours. BNP (last 3 results) No results for input(s): PROBNP in the last 8760 hours. HbA1C: No results for input(s): HGBA1C in the last 72 hours. CBG: Recent Labs  Lab 10/18/21 0858  GLUCAP 98   Lipid Profile: No results for input(s): CHOL, HDL, LDLCALC, TRIG, CHOLHDL, LDLDIRECT in the last 72 hours. Thyroid Function Tests: No results for input(s): TSH, T4TOTAL, FREET4, T3FREE, THYROIDAB in the last 72 hours. Anemia Panel: No results for input(s): VITAMINB12, FOLATE, FERRITIN, TIBC, IRON, RETICCTPCT in the last 72 hours. Sepsis Labs: Recent Labs   Lab 10/18/21 0848  LATICACIDVEN 0.7    Recent Results (from the past 240 hour(s))  Resp Panel by RT-PCR (Flu A&B, Covid) Nasopharyngeal Swab     Status: None   Collection Time: 10/18/21  9:26 AM   Specimen: Nasopharyngeal Swab; Nasopharyngeal(NP) swabs in vial transport medium  Result Value Ref Range Status   SARS Coronavirus 2 by RT PCR NEGATIVE NEGATIVE Final    Comment: (NOTE) SARS-CoV-2 target nucleic acids are NOT DETECTED.  The SARS-CoV-2 RNA is generally detectable in upper respiratory specimens during the acute phase of infection. The lowest concentration of SARS-CoV-2 viral copies this assay can detect is 138 copies/mL. A negative result does not preclude SARS-Cov-2 infection and should not be used as the sole basis for treatment or other patient management decisions. A negative result may occur with  improper specimen collection/handling, submission of specimen other than nasopharyngeal swab, presence of viral mutation(s) within the areas targeted by this assay, and inadequate number of viral copies(<138 copies/mL). A negative result must be combined with clinical observations, patient history, and epidemiological information. The expected result is Negative.  Fact Sheet for Patients:  EntrepreneurPulse.com.au  Fact Sheet for Healthcare Providers:  IncredibleEmployment.be  This test is no t yet approved or cleared by the [place] FDA and  has been authorized for detection and/or diagnosis of SARS-CoV-2 by FDA under an Emergency Use Authorization (EUA). This EUA will remain  in effect (meaning this test can be used) for the duration of the COVID-19 declaration under Section 564(b)(1) of the Act, 21 U.S.C.section 360bbb-3(b)(1), unless the authorization is terminated  or revoked sooner.       Influenza A by PCR NEGATIVE NEGATIVE Final   Influenza B by PCR NEGATIVE NEGATIVE Final    Comment: (NOTE) The Xpert Xpress  SARS-CoV-2/FLU/RSV plus assay is intended as an aid in the diagnosis of influenza from Nasopharyngeal swab specimens and should not be used as a sole basis for treatment. Nasal washings and aspirates are unacceptable for Xpert Xpress SARS-CoV-2/FLU/RSV testing.  Fact Sheet for Patients: EntrepreneurPulse.com.au  Fact Sheet for Healthcare Providers: IncredibleEmployment.be  This test is not yet approved or cleared by the [place] FDA and has been authorized for detection and/or diagnosis of SARS-CoV-2 by FDA under an Emergency Use Authorization (EUA). This EUA will remain in effect (meaning this test can be used) for the duration of the COVID-19 declaration under Section 564(b)(1) of the Act, 21 U.S.C. section 360bbb-3(b)(1), unless the authorization is terminated or revoked.  Performed at [facility], [place] [address], [place], [place] [address]   Urine Culture     Status: Abnormal (Preliminary result)   Collection Time:  10/18/21  9:26 AM   Specimen: Urine, Clean Catch  Result Value Ref Range Status   Specimen Description   Final    URINE, CLEAN CATCH Performed at [facility], [place] [address], [place], [place] [address]    Special Requests   Final    NONE Performed at [facility], [place] [address] [place], [place] [address]    Culture (A)  Final    >=100,000 COLONIES/mL ESCHERICHIA COLI SUSCEPTIBILITIES TO FOLLOW Performed at [facility], [place] [address], [place], [place] [address]    Report Status PENDING  Incomplete         Radiology Studies: CT Head Wo Contrast  Result Date: 10/18/2021 CLINICAL DATA:  Confusion, weakness, head injury. EXAM: CT HEAD WITHOUT CONTRAST TECHNIQUE: Contiguous axial images were obtained from the base of the skull through the vertex without intravenous contrast. COMPARISON:  October 11, 2017. FINDINGS: Brain: Mild chronic ischemic white  matter disease is noted. No mass effect or midline shift is noted. Ventricular size is within normal limits. There is no evidence of mass lesion, hemorrhage or acute infarction. Vascular: No hyperdense vessel or unexpected calcification. Skull: Normal. Negative for fracture or focal lesion. Sinuses/Orbits: No acute finding. Other: None. IMPRESSION: No acute intracranial abnormality seen. Electronically Signed   By: Marijo Conception M.D.   On: 10/18/2021 09:51   US RENAL  Result Date: 10/18/2021 CLINICAL DATA:  AK I EXAM: RENAL / URINARY TRACT ULTRASOUND COMPLETE COMPARISON:  None. FINDINGS: Right Kidney: Renal measurements: 10.1 x 4.4 x 5.4 cm = volume: 124 mL. Echogenicity within normal limits. No mass or hydronephrosis visualized. Left Kidney: Renal measurements: 9.4 x 5.0 x 5.0 cm = volume: 122 mL. Echogenicity within normal limits. No mass or hydronephrosis visualized. Bladder: Trabeculated bladder wall.  Bilateral jets visualized. Other: Somewhat limited exam due to overlying bowel gas. IMPRESSION: 1. No evidence of hydronephrosis. 2. Trabeculated bladder wall, finding can be seen in the setting of chronic bladder dysfunction. Electronically Signed   By: Yetta Glassman M.D.   On: 10/18/2021 14:41   DG Chest Port 1 View  Result Date: 10/18/2021 CLINICAL DATA:  Weakness EXAM: PORTABLE CHEST 1 VIEW COMPARISON:  07/20/2021 FINDINGS: The heart size and mediastinal contours are within normal limits. Hiatal hernia. Both lungs are clear. The visualized skeletal structures are unremarkable. IMPRESSION: No active disease. Electronically Signed   By: Davina Poke D.O.   On: 10/18/2021 10:28        Scheduled Meds:  vitamin C  250 mg Oral Daily   docusate sodium  100 mg Oral Daily   hydrALAZINE  37.5 mg Oral TID   irbesartan  150 mg Oral Daily   lamoTRIgine  100 mg Oral BID   magnesium oxide  400 mg Oral Daily   pneumococcal 23 valent vaccine  0.5 mL Intramuscular Tomorrow-1000   potassium  chloride  40 mEq Oral Q3H   rosuvastatin  10 mg Oral Daily   Continuous Infusions:  sodium chloride 125 mL/hr at 10/19/21 2536   cefTRIAXone (ROCEPHIN)  IV     magnesium sulfate bolus IVPB       LOS: 1 day    Time spent: 39 minutes spent on chart review, discussion with nursing staff, consultants, updating family and interview/physical exam; more than 50% of that time was spent in counseling and/or coordination of care.    Emberlin Verner J [place] DO [facility] Available via Epic secure chat 7am-7pm After these hours, please refer  to coverage provider listed on [email or web address] 10/19/2021, 11:42 AM

## 2021-10-20 DIAGNOSIS — F0390 Unspecified dementia without behavioral disturbance: Secondary | ICD-10-CM

## 2021-10-20 DIAGNOSIS — N390 Urinary tract infection, site not specified: Secondary | ICD-10-CM

## 2021-10-20 DIAGNOSIS — M6281 Muscle weakness (generalized): Secondary | ICD-10-CM

## 2021-10-20 DIAGNOSIS — B9620 Unspecified Escherichia coli [E. coli] as the cause of diseases classified elsewhere: Secondary | ICD-10-CM

## 2021-10-20 DIAGNOSIS — E876 Hypokalemia: Secondary | ICD-10-CM

## 2021-10-20 DIAGNOSIS — I10 Essential (primary) hypertension: Secondary | ICD-10-CM

## 2021-10-20 LAB — CBC
HCT: 28.1 % — ABNORMAL LOW (ref 36.0–46.0)
Hemoglobin: 10.1 g/dL — ABNORMAL LOW (ref 12.0–15.0)
MCH: 32.5 pg (ref 26.0–34.0)
MCHC: 35.9 g/dL (ref 30.0–36.0)
MCV: 90.4 fL (ref 80.0–100.0)
Platelets: 175 10*3/uL (ref 150–400)
RBC: 3.11 MIL/uL — ABNORMAL LOW (ref 3.87–5.11)
RDW: 13.2 % (ref 11.5–15.5)
WBC: 8 10*3/uL (ref 4.0–10.5)
nRBC: 0 % (ref 0.0–0.2)

## 2021-10-20 LAB — PTH, INTACT AND CALCIUM
Calcium, Total (PTH): 13.1 mg/dL (ref 8.7–10.3)
PTH: 16 pg/mL (ref 15–65)

## 2021-10-20 LAB — URINE CULTURE: Culture: >=100000 — AB

## 2021-10-20 LAB — BASIC METABOLIC PANEL
Anion gap: 9 (ref 5–15)
BUN: 8 mg/dL (ref 8–23)
CO2: 25 mmol/L (ref 22–32)
Calcium: 10.4 mg/dL — ABNORMAL HIGH (ref 8.9–10.3)
Chloride: 109 mmol/L (ref 98–111)
Creatinine, Ser: 1.07 mg/dL — ABNORMAL HIGH (ref 0.44–1.00)
GFR, Estimated: 56 mL/min — ABNORMAL LOW (ref >60)
Glucose, Bld: 110 mg/dL — ABNORMAL HIGH (ref 70–99)
Potassium: 3.1 mmol/L — ABNORMAL LOW (ref 3.5–5.1)
Sodium: 143 mmol/L (ref 135–145)

## 2021-10-20 LAB — MAGNESIUM: Magnesium: 2.1 mg/dL (ref 1.7–2.4)

## 2021-10-20 MED ORDER — HALOPERIDOL LACTATE 5 MG/ML IJ SOLN
1.0000 mg | Freq: Once | INTRAMUSCULAR | Status: AC
  Administered 2021-10-20: 22:00:00 1 mg via INTRAMUSCULAR
  Filled 2021-10-20: qty 1

## 2021-10-20 MED ORDER — POTASSIUM CHLORIDE CRYS ER 20 MEQ PO TBCR
40.0000 meq | EXTENDED_RELEASE_TABLET | ORAL | Status: AC
  Administered 2021-10-20 (×2): 40 meq via ORAL
  Filled 2021-10-20 (×2): qty 2

## 2021-10-20 MED ORDER — CEPHALEXIN 500 MG PO CAPS
500.0000 mg | ORAL_CAPSULE | Freq: Two times a day (BID) | ORAL | Status: DC
  Administered 2021-10-21: 500 mg via ORAL
  Filled 2021-10-20: qty 1

## 2021-10-20 NOTE — Progress Notes (Signed)
PROGRESS NOTE    TREVIA Cook  EKC:[id number] DOB: 03-20-1951 DOA: 10/18/2021 PCP: Cari Caraway, MD    Brief Narrative:  Sarah Cook is a 70 year old female with past medical history of dementia, hypertension, anxiety, parathyroid disease presented to hospital with generalized weakness, poor oral appetite, ambulatory dysfunction.  In the ED, patient was noted to be hypertensive.  Potassium was low at 2.0.  Creatinine elevated at 1.3 calcium elevated at 13.4.  Anion gap was 10.  Bilirubin was 1.7.  Patient was negative for COVID and influenza.  UA however showed 21-50 white cells.  Chest x-ray did not show any acute process.  CT head was negative.  Renal ultrasound showed trabeculated bladder which can be seen in the setting of chronic bladder dysfunction.  Patient was then considered for admission to hospital in the setting of UTI, hypokalemia, acute kidney injury, hypercalcemia.   Assessment & Plan:   Principal Problem:   Hypercalcemia Active Problems:   Hypertension   Hyperlipidemia   Hypokalemia   Generalized muscle weakness   Hypertensive urgency   Dementia without behavioral disturbance (HCC)   E-coli UTI   E. coli UTI Urinalysis with large leukocytes, positive nitrite, rare bacteria, 21-50 WBCs. Patient is sensitive to multiple antibiotic.  Already on IV Rocephin.  Could change to oral Keflex on discharge.  Hypercalcemia:  Resolved. Thought to be secondary to severe dehydration.  Improved with IV fluids.  Hypokalemia Potassium of 3.1 today.  We will continue to replace.  Check BMP in AM.  Hypomagnesemia  improved after replacement.  Continue magnesium oxide.  Latest magnesium of 2.1.  Acute renal failure Baseline creatinine 0.6-0.8.  Creatinine today at 1.07.  On IVF hydration with NS at 75 mL/h, dose of irbesartan at half dose 150 mg p.o. daily.  Check BMP in AM.  We will discontinue IV fluids today.  Hypertensive urgency  Continue hydralazine, irbesartan.  DC  normal saline  Hyperlipidemia:  Continue Crestor   Anxiety/depression/Dementia without behavioral disturbance Continue lamictal, delirium precautions.  Patient appears stable at this time  Weakness/deconditioning/debility: Physical therapy  recommended skilled nursing facility placement on discharge.     DVT prophylaxis: SCDs Start: 10/18/21 1316    Code Status: DNR  Family Communication:  None at present.  Disposition Plan: SNF when bed available.  Likely in 1 to 2 days.   Status is: Inpatient  Remains inpatient appropriate because:, Hypokalemia, need for skilled nursing facility placement.  Consultants:  none  Procedures:  none  Antimicrobials:  Ceftriaxone 12/12>>   Subjective: Today, patient was seen and examined at bedside.  States that she feels okay.  Was able to sleep okay.  Denies any overt pain.  Has had a bowel movement.  Denies any urinary urgency frequency or dysuria   Objective: Vitals:   10/19/21 0804 10/19/21 1528 10/19/21 1943 10/20/21 0603  BP: (!) 178/75 (!) 162/77 (!) 174/81 (!) 185/81  Pulse: 91 96 98 95  Resp:   20 18  Temp:  98.5 F (36.9 C) 98.4 F (36.9 C) 98.3 F (36.8 C)  TempSrc:  Oral Oral Oral  SpO2:  96% 95% 97%  Weight:      Height:        Intake/Output Summary (Last 24 hours) at 10/20/2021 0724 Last data filed at 10/20/2021 0645 Gross per 24 hour  Intake 1851.33 ml  Output 1600 ml  Net 251.33 ml    Filed Weights   10/18/21 0845  Weight: 77.1 kg    Physical examination:  General:  Average built, not in obvious distress, alert awake and communicative. HENT:   No scleral pallor or icterus noted. Oral mucosa is moist.  Chest:  Clear breath sounds.  Diminished breath sounds bilaterally. No crackles or wheezes.  CVS: S1 &S2 heard. No murmur.  Regular rate and rhythm. Abdomen: Soft, nontender, nondistended.  Bowel sounds are heard.   Extremities: No cyanosis, clubbing or edema.  Peripheral pulses are  palpable. Psych: Alert, awake and communicative oriented to place only., normal mood CNS:  No cranial nerve deficits.  Power equal in all extremities.   Skin: Warm and dry.  No rashes noted.   Data Reviewed: I have personally reviewed the following labs and imaging studies   CBC: Recent Labs  Lab 10/18/21 0848 10/19/21 0503 10/20/21 0445  WBC 8.3 6.5 8.0  NEUTROABS 6.1  --   --   HGB 11.7* 9.2* 10.1*  HCT 32.7* 25.2* 28.1*  MCV 89.3 89.0 90.4  PLT 287 212 532    Basic Metabolic Panel: Recent Labs  Lab 10/18/21 0848 10/18/21 1126 10/18/21 1609 10/19/21 0503 10/19/21 1705 10/20/21 0445  NA 144  --  145 143   143  --  143  K 2.0*  --  2.6* 2.4*   2.4* 2.8* 3.1*  CL 100  --  104 106   106  --  109  CO2 34*  --  29 30   29   --  25  GLUCOSE 107*  --  96 100*   101*  --  110*  BUN 17  --  14 11   11   --  8  CREATININE 1.39*  --  1.26* 1.17*   1.14*  --  1.07*  CALCIUM 13.4*  --  12.4* 10.7*   10.7*  --  10.4*  MG  --  1.8  --  1.6*  --  2.1  PHOS  --   --  2.1* 2.6  --   --     GFR: Estimated Creatinine Clearance: 45.3 mL/min (A) (by C-G formula based on SCr of 1.07 mg/dL (H)). Liver Function Tests: Recent Labs  Lab 10/18/21 0848 10/18/21 1609 10/19/21 0503  AST 25  --  23  ALT 15  --  14  ALKPHOS 37*  --  40  BILITOT 1.7*  --  1.0  PROT 7.4  --  5.7*  ALBUMIN 4.5 4.2 3.5   3.5    No results for input(s): LIPASE, AMYLASE in the last 168 hours. No results for input(s): AMMONIA in the last 168 hours. Coagulation Profile: No results for input(s): INR, PROTIME in the last 168 hours. Cardiac Enzymes: No results for input(s): CKTOTAL, CKMB, CKMBINDEX, TROPONINI in the last 168 hours. BNP (last 3 results) No results for input(s): PROBNP in the last 8760 hours. HbA1C: No results for input(s): HGBA1C in the last 72 hours. CBG: Recent Labs  Lab 10/18/21 0858  GLUCAP 98    Lipid Profile: No results for input(s): CHOL, HDL, LDLCALC, TRIG, CHOLHDL, LDLDIRECT  in the last 72 hours. Thyroid Function Tests: No results for input(s): TSH, T4TOTAL, FREET4, T3FREE, THYROIDAB in the last 72 hours. Anemia Panel: No results for input(s): VITAMINB12, FOLATE, FERRITIN, TIBC, IRON, RETICCTPCT in the last 72 hours. Sepsis Labs: Recent Labs  Lab 10/18/21 0848  LATICACIDVEN 0.7     Recent Results (from the past 240 hour(s))  Resp Panel by RT-PCR (Flu A&B, Covid) Nasopharyngeal Swab     Status: None   Collection Time: 10/18/21  9:26 AM   Specimen: Nasopharyngeal Swab; Nasopharyngeal(NP) swabs in vial transport medium  Result Value Ref Range Status   SARS Coronavirus 2 by RT PCR NEGATIVE NEGATIVE Final    Comment: (NOTE) SARS-CoV-2 target nucleic acids are NOT DETECTED.  The SARS-CoV-2 RNA is generally detectable in upper respiratory specimens during the acute phase of infection. The lowest concentration of SARS-CoV-2 viral copies this assay can detect is 138 copies/mL. A negative result does not preclude SARS-Cov-2 infection and should not be used as the sole basis for treatment or other patient management decisions. A negative result may occur with  improper specimen collection/handling, submission of specimen other than nasopharyngeal swab, presence of viral mutation(s) within the areas targeted by this assay, and inadequate number of viral copies(<138 copies/mL). A negative result must be combined with clinical observations, patient history, and epidemiological information. The expected result is Negative.  Fact Sheet for Patients:  EntrepreneurPulse.com.au  Fact Sheet for Healthcare Providers:  IncredibleEmployment.be  This test is no t yet approved or cleared by the [place] FDA and  has been authorized for detection and/or diagnosis of SARS-CoV-2 by FDA under an Emergency Use Authorization (EUA). This EUA will remain  in effect (meaning this test can be used) for the duration of the COVID-19  declaration under Section 564(b)(1) of the Act, 21 U.S.C.section 360bbb-3(b)(1), unless the authorization is terminated  or revoked sooner.       Influenza A by PCR NEGATIVE NEGATIVE Final   Influenza B by PCR NEGATIVE NEGATIVE Final    Comment: (NOTE) The Xpert Xpress SARS-CoV-2/FLU/RSV plus assay is intended as an aid in the diagnosis of influenza from Nasopharyngeal swab specimens and should not be used as a sole basis for treatment. Nasal washings and aspirates are unacceptable for Xpert Xpress SARS-CoV-2/FLU/RSV testing.  Fact Sheet for Patients: EntrepreneurPulse.com.au  Fact Sheet for Healthcare Providers: IncredibleEmployment.be  This test is not yet approved or cleared by the [place] FDA and has been authorized for detection and/or diagnosis of SARS-CoV-2 by FDA under an Emergency Use Authorization (EUA). This EUA will remain in effect (meaning this test can be used) for the duration of the COVID-19 declaration under Section 564(b)(1) of the Act, 21 U.S.C. section 360bbb-3(b)(1), unless the authorization is terminated or revoked.  Performed at [facility], [place] [address] [place], [place] [address]   Urine Culture     Status: Abnormal (Preliminary result)   Collection Time: 10/18/21  9:26 AM   Specimen: Urine, Clean Catch  Result Value Ref Range Status   Specimen Description   Final    URINE, CLEAN CATCH Performed at [facility], [place] [address] [place], [place] [address]    Special Requests   Final    NONE Performed at [facility], [place] [address] [place], [place] [address]    Culture (A)  Final    >=100,000 COLONIES/mL ESCHERICHIA COLI SUSCEPTIBILITIES TO FOLLOW Performed at [facility], [place] [address], [place], [place] [address]    Report Status PENDING  Incomplete     Radiology Studies: CT Head Wo Contrast  Result Date: 10/18/2021 CLINICAL  DATA:  Confusion, weakness, head injury. EXAM: CT HEAD WITHOUT CONTRAST TECHNIQUE: Contiguous axial images were obtained from the base of the skull through the vertex without intravenous contrast. COMPARISON:  October 11, 2017. FINDINGS: Brain: Mild chronic ischemic white matter disease is noted. No mass effect or midline shift is noted. Ventricular size is within normal limits. There is  no evidence of mass lesion, hemorrhage or acute infarction. Vascular: No hyperdense vessel or unexpected calcification. Skull: Normal. Negative for fracture or focal lesion. Sinuses/Orbits: No acute finding. Other: None. IMPRESSION: No acute intracranial abnormality seen. Electronically Signed   By: Marijo Conception M.D.   On: 10/18/2021 09:51   US RENAL  Result Date: 10/18/2021 CLINICAL DATA:  AK I EXAM: RENAL / URINARY TRACT ULTRASOUND COMPLETE COMPARISON:  None. FINDINGS: Right Kidney: Renal measurements: 10.1 x 4.4 x 5.4 cm = volume: 124 mL. Echogenicity within normal limits. No mass or hydronephrosis visualized. Left Kidney: Renal measurements: 9.4 x 5.0 x 5.0 cm = volume: 122 mL. Echogenicity within normal limits. No mass or hydronephrosis visualized. Bladder: Trabeculated bladder wall.  Bilateral jets visualized. Other: Somewhat limited exam due to overlying bowel gas. IMPRESSION: 1. No evidence of hydronephrosis. 2. Trabeculated bladder wall, finding can be seen in the setting of chronic bladder dysfunction. Electronically Signed   By: Yetta Glassman M.D.   On: 10/18/2021 14:41   DG Chest Port 1 View  Result Date: 10/18/2021 CLINICAL DATA:  Weakness EXAM: PORTABLE CHEST 1 VIEW COMPARISON:  07/20/2021 FINDINGS: The heart size and mediastinal contours are within normal limits. Hiatal hernia. Both lungs are clear. The visualized skeletal structures are unremarkable. IMPRESSION: No active disease. Electronically Signed   By: Davina Poke D.O.   On: 10/18/2021 10:28    Scheduled Meds:  vitamin C  250 mg Oral  Daily   docusate sodium  100 mg Oral Daily   feeding supplement  237 mL Oral BID BM   hydrALAZINE  37.5 mg Oral TID   irbesartan  150 mg Oral Daily   lamoTRIgine  100 mg Oral BID   magnesium oxide  400 mg Oral Daily   multivitamin with minerals  1 tablet Oral Daily   rosuvastatin  10 mg Oral Daily   Continuous Infusions:  sodium chloride 75 mL/hr at 10/19/21 1921   cefTRIAXone (ROCEPHIN)  IV 1 g (10/19/21 1923)     LOS: 2 days    Saida Lonon,MD [facility] 10/20/2021, 7:24 AM

## 2021-10-20 NOTE — Evaluation (Signed)
Occupational Therapy Evaluation Patient Details Name: Sarah Cook MRN: [id number] DOB: [birth year] Today's Date: 10/20/2021   History of Present Illness 70 yo female admitted with hypercalcemia, hypokalemia, weakness, worsened confusion. Hx of dementia, anxiety, incontinence, TIA, breat ca, gait abnormality, CHF   Clinical Impression   Patient is a confused 70 year old female who was noted to have had a functional decline. Patient is currently +2 for transfers and ADLs. Patient was noted to need consistent redirection to attend to tasks during session. Patient would continue to benefit from skilled OT services at this time while admitted and after d/c to address noted deficits in order to improve overall safety and independence in ADLs.        Recommendations for follow up therapy are one component of a multi-disciplinary discharge planning process, led by the attending physician.  Recommendations may be updated based on patient status, additional functional criteria and insurance authorization.   Follow Up Recommendations  Skilled nursing-short term rehab (<3 hours/day)    Assistance Recommended at Discharge Frequent or constant Supervision/Assistance  Functional Status Assessment  Patient has had a recent decline in their functional status and demonstrates the ability to make significant improvements in function in a reasonable and predictable amount of time.  Equipment Recommendations  None recommended by OT    Recommendations for Other Services       Precautions / Restrictions        Mobility Bed Mobility Overal bed mobility: Needs Assistance Bed Mobility: Rolling Rolling: Max assist (with increased time.)         General bed mobility comments: patient was positioned in chair position in bed with lateral lean to L side with pillow placement to reduce leaning. patient kept asking how to get out of hospital. high falls risk.    Transfers                           Balance Overall balance assessment: Needs assistance Sitting-balance support: Feet supported     Postural control: Left lateral lean                                 ADL either performed or assessed with clinical judgement   ADL Overall ADL's : Needs assistance/impaired Eating/Feeding: Set up;Minimal assistance;Sitting Eating/Feeding Details (indicate cue type and reason): patient needs step by step sequencing cues to continue intake of food. patient was noted to repeat questions various times during session one shortly after it was just answered. patient was impulsive during session as well. NT was educated on importance of sitting with patient and cues needed to increase intake. NT verbalized understanding. Grooming: Dance movement psychotherapist;Wash/dry hands;Sitting;Set up;Bed level Grooming Details (indicate cue type and reason): in bed with set up. Upper Body Bathing: Moderate assistance;Bed level   Lower Body Bathing: Bed level;Maximal assistance Lower Body Bathing Details (indicate cue type and reason): attention to task and for thoughness of task. Upper Body Dressing : Minimal assistance;Bed level   Lower Body Dressing: Bed level;Maximal assistance Lower Body Dressing Details (indicate cue type and reason): patient was able to pull off gripper socks but was unable to don gripper socks with multiple attempts. Toilet Transfer: +2 for physical assistance;+2 for safety/equipment Toilet Transfer Details (indicate cue type and reason): deferred with strong lean for sitting balance to R side. Toileting- Clothing Manipulation and Hygiene: Maximal assistance;Bed level  Functional mobility during ADLs: +2 for safety/equipment;+2 for physical assistance       Vision   Additional Comments: unable to formally assess with patients cognition. patient was noted to be able to read sign in room when therapist pointed to name of hospital. patient was also noted to read socks.      Perception     Praxis      Pertinent Vitals/Pain       Hand Dominance     Extremity/Trunk Assessment Upper Extremity Assessment Upper Extremity Assessment: Generalized weakness;Difficult to assess due to impaired cognition   Lower Extremity Assessment Lower Extremity Assessment: Defer to PT evaluation   Cervical / Trunk Assessment Cervical / Trunk Assessment: Kyphotic   Communication     Cognition                                             General Comments       Exercises     Shoulder Instructions      Home Living                                          Prior Functioning/Environment                          OT Problem List: Decreased cognition;Decreased safety awareness;Impaired balance (sitting and/or standing);Decreased activity tolerance      OT Treatment/Interventions: Self-care/ADL training;Therapeutic exercise;Neuromuscular education;Therapeutic activities;Patient/family education    OT Goals(Current goals can be found in the care plan section) Acute Rehab OT Goals Patient Stated Goal: to get to [place] OT Goal Formulation: Patient unable to participate in goal setting Time For Goal Achievement: 11/03/21 Potential to Achieve Goals: Fair  OT Frequency: Min 2X/week   Barriers to D/C:            Co-evaluation              AM-PAC OT "6 Clicks" Daily Activity     Outcome Measure Help from another person eating meals?: A Lot Help from another person taking care of personal grooming?: A Lot Help from another person toileting, which includes using toliet, bedpan, or urinal?: A Lot Help from another person bathing (including washing, rinsing, drying)?: A Lot Help from another person to put on and taking off regular upper body clothing?: A Lot Help from another person to put on and taking off regular lower body clothing?: A Lot 6 Click Score: 12   End of Session    Activity Tolerance: Patient  tolerated treatment well Patient left: in bed;with call bell/phone within reach;with bed alarm set  OT Visit Diagnosis: Unsteadiness on feet (R26.81);Other symptoms and signs involving cognitive function                Time: 3500-9381 OT Time Calculation (min): 16 min Charges:  OT General Charges $OT Visit: 1 Visit OT Evaluation $OT Eval Low Complexity: 1 Low  Leota Sauers, MS Acute Rehabilitation Department Office# [phone] Pager# [phone]   Marcellina Millin 10/20/2021, 1:15 PM

## 2021-10-20 NOTE — TOC Progression Note (Signed)
Transition of Care [facility]) - Progression Note    Patient Details  Name: Sarah Cook MRN: [id number] Date of Birth: 06-01-1951  Transition of Care [facility]) CM/SW Contact  Erica Osuna, Juliann Pulse, RN Phone Number: 10/20/2021, 3:00 PM  Clinical Narrative: Rogelia Rohrer to [facility] health [phone] [place] for [facility]      Expected Discharge Plan: Skilled Nursing Facility Barriers to Discharge: Insurance Authorization  Expected Discharge Plan and Services Expected Discharge Plan: [place]                                               Social Determinants of Health (SDOH) Interventions    Readmission Risk Interventions No flowsheet data found.

## 2021-10-21 DIAGNOSIS — F0390 Unspecified dementia without behavioral disturbance: Secondary | ICD-10-CM | POA: Diagnosis not present

## 2021-10-21 DIAGNOSIS — R410 Disorientation, unspecified: Secondary | ICD-10-CM | POA: Diagnosis not present

## 2021-10-21 DIAGNOSIS — N390 Urinary tract infection, site not specified: Secondary | ICD-10-CM | POA: Diagnosis not present

## 2021-10-21 DIAGNOSIS — Z8673 Personal history of transient ischemic attack (TIA), and cerebral infarction without residual deficits: Secondary | ICD-10-CM | POA: Diagnosis not present

## 2021-10-21 DIAGNOSIS — N3289 Other specified disorders of bladder: Secondary | ICD-10-CM | POA: Diagnosis not present

## 2021-10-21 DIAGNOSIS — I129 Hypertensive chronic kidney disease with stage 1 through stage 4 chronic kidney disease, or unspecified chronic kidney disease: Secondary | ICD-10-CM | POA: Diagnosis not present

## 2021-10-21 DIAGNOSIS — K582 Mixed irritable bowel syndrome: Secondary | ICD-10-CM | POA: Diagnosis not present

## 2021-10-21 DIAGNOSIS — M6281 Muscle weakness (generalized): Secondary | ICD-10-CM | POA: Diagnosis not present

## 2021-10-21 DIAGNOSIS — F411 Generalized anxiety disorder: Secondary | ICD-10-CM | POA: Diagnosis not present

## 2021-10-21 DIAGNOSIS — R2689 Other abnormalities of gait and mobility: Secondary | ICD-10-CM | POA: Diagnosis not present

## 2021-10-21 DIAGNOSIS — B9620 Unspecified Escherichia coli [E. coli] as the cause of diseases classified elsewhere: Secondary | ICD-10-CM | POA: Diagnosis not present

## 2021-10-21 DIAGNOSIS — Z7401 Bed confinement status: Secondary | ICD-10-CM | POA: Diagnosis not present

## 2021-10-21 DIAGNOSIS — R296 Repeated falls: Secondary | ICD-10-CM | POA: Diagnosis not present

## 2021-10-21 DIAGNOSIS — E876 Hypokalemia: Secondary | ICD-10-CM | POA: Diagnosis not present

## 2021-10-21 DIAGNOSIS — I10 Essential (primary) hypertension: Secondary | ICD-10-CM | POA: Diagnosis not present

## 2021-10-21 DIAGNOSIS — I5032 Chronic diastolic (congestive) heart failure: Secondary | ICD-10-CM | POA: Diagnosis not present

## 2021-10-21 DIAGNOSIS — R488 Other symbolic dysfunctions: Secondary | ICD-10-CM | POA: Diagnosis not present

## 2021-10-21 DIAGNOSIS — F32A Depression, unspecified: Secondary | ICD-10-CM | POA: Diagnosis not present

## 2021-10-21 DIAGNOSIS — R5383 Other fatigue: Secondary | ICD-10-CM | POA: Diagnosis not present

## 2021-10-21 DIAGNOSIS — R262 Difficulty in walking, not elsewhere classified: Secondary | ICD-10-CM | POA: Diagnosis not present

## 2021-10-21 DIAGNOSIS — Z23 Encounter for immunization: Secondary | ICD-10-CM | POA: Diagnosis present

## 2021-10-21 DIAGNOSIS — E7800 Pure hypercholesterolemia, unspecified: Secondary | ICD-10-CM

## 2021-10-21 DIAGNOSIS — I160 Hypertensive urgency: Secondary | ICD-10-CM

## 2021-10-21 DIAGNOSIS — R531 Weakness: Secondary | ICD-10-CM | POA: Diagnosis not present

## 2021-10-21 LAB — CBC
HCT: 27.5 % — ABNORMAL LOW (ref 36.0–46.0)
Hemoglobin: 9.8 g/dL — ABNORMAL LOW (ref 12.0–15.0)
MCH: 32.3 pg (ref 26.0–34.0)
MCHC: 35.6 g/dL (ref 30.0–36.0)
MCV: 90.8 fL (ref 80.0–100.0)
Platelets: 208 10*3/uL (ref 150–400)
RBC: 3.03 MIL/uL — ABNORMAL LOW (ref 3.87–5.11)
RDW: 13.1 % (ref 11.5–15.5)
WBC: 7.7 10*3/uL (ref 4.0–10.5)
nRBC: 0 % (ref 0.0–0.2)

## 2021-10-21 LAB — BASIC METABOLIC PANEL
Anion gap: 7 (ref 5–15)
BUN: 8 mg/dL (ref 8–23)
CO2: 26 mmol/L (ref 22–32)
Calcium: 10 mg/dL (ref 8.9–10.3)
Chloride: 110 mmol/L (ref 98–111)
Creatinine, Ser: 1.08 mg/dL — ABNORMAL HIGH (ref 0.44–1.00)
GFR, Estimated: 55 mL/min — ABNORMAL LOW (ref >60)
Glucose, Bld: 102 mg/dL — ABNORMAL HIGH (ref 70–99)
Potassium: 3.1 mmol/L — ABNORMAL LOW (ref 3.5–5.1)
Sodium: 143 mmol/L (ref 135–145)

## 2021-10-21 LAB — MAGNESIUM: Magnesium: 2.1 mg/dL (ref 1.7–2.4)

## 2021-10-21 LAB — RESP PANEL BY RT-PCR (FLU A&B, COVID) ARPGX2
Influenza A by PCR: NEGATIVE
Influenza B by PCR: NEGATIVE
SARS Coronavirus 2 by RT PCR: NEGATIVE

## 2021-10-21 MED ORDER — ENSURE ENLIVE PO LIQD: 237.0000 mL | Freq: Two times a day (BID) | ORAL | Status: AC

## 2021-10-21 MED ORDER — ADULT MULTIVITAMIN W/MINERALS CH: 1.0000 | ORAL_TABLET | Freq: Every day | ORAL | 0 refills | Status: AC

## 2021-10-21 MED ORDER — POTASSIUM CHLORIDE CRYS ER 20 MEQ PO TBCR
40.0000 meq | EXTENDED_RELEASE_TABLET | Freq: Once | ORAL | Status: AC
  Administered 2021-10-21: 40 meq via ORAL
  Filled 2021-10-21: qty 2

## 2021-10-21 MED ORDER — HYDRALAZINE HCL 50 MG PO TABS: 50.0000 mg | ORAL_TABLET | Freq: Three times a day (TID) | ORAL | Status: DC

## 2021-10-21 MED ORDER — CEPHALEXIN 500 MG PO CAPS: 500.0000 mg | ORAL_CAPSULE | Freq: Two times a day (BID) | ORAL | 0 refills | Status: AC

## 2021-10-21 MED ORDER — ACETAMINOPHEN ER 650 MG PO TBCR: 650.0000 mg | EXTENDED_RELEASE_TABLET | Freq: Three times a day (TID) | ORAL | Status: DC | PRN

## 2021-10-21 MED ORDER — POTASSIUM CHLORIDE CRYS ER 20 MEQ PO TBCR: 20.0000 meq | EXTENDED_RELEASE_TABLET | Freq: Every day | ORAL | Status: DC

## 2021-10-21 NOTE — Progress Notes (Signed)
Daughter present during discharge

## 2021-10-21 NOTE — TOC Transition Note (Addendum)
Transition of Care [facility]) - CM/SW Discharge Note   Patient Details  Name: VINITA PRENTISS MRN: [id number] Date of Birth: August 04, 1951  Transition of Care [facility]) CM/SW Contact:  Dessa Phi, RN Phone Number: 10/21/2021, 10:18 AM   Clinical Narrative:  Vedia Coffer TDHR#[id number],XMIW OEHO#[id number] till 12/16-[facility] rep Juliann Pulse can accept-requested regular covid yesterday-today rapid covid requested. Await dovid results & d/c summary,rm#,tel# for report  prior PTAR. -1:12p-PTAR called. No further CM needs.    Final next level of care: [place] Barriers to Discharge: No Barriers Identified   Patient Goals and CMS Choice        Discharge Placement PASRR number recieved: 10/20/21            Patient chooses bed at: [facility] Patient to be transferred to facility by: [place] Name of family member notified: Ollen Gross spouse [phone] Patient and family notified of of transfer: 10/21/21  Discharge Plan and Services                                     Social Determinants of Health (SDOH) Interventions     Readmission Risk Interventions No flowsheet data found.

## 2021-10-21 NOTE — Discharge Summary (Signed)
Physician Discharge Summary  Sarah Cook TDH:[id number] DOB: 1950/12/20 DOA: 10/18/2021  PCP: Cari Caraway, MD  Admit date: 10/18/2021 Discharge date: 10/21/2021  Admitted From: Home  Discharge disposition: SNF  Recommendations for Outpatient Follow-Up:   Follow up with your primary care provider in one week.  Check CBC, CMP, magnesium in the next visit.  Patient had hypokalemia in the hospital and has been given potassium supplements Please encourage oral hydration. Could add amlodipine or increase the dose of hydralazine if necessary for elevated blood pressure at the skilled nursing facility.  Discharge Diagnosis:   Principal Problem:   Hypercalcemia Active Problems:   Hypertension   Hyperlipidemia   Hypokalemia   Generalized muscle weakness   Hypertensive urgency   Dementia without behavioral disturbance ([place])   E-coli UTI  Discharge Condition: Improved.  Diet recommendation:   Regular.  Wound care: None.  Code status:  DNR  History of Present Illness:   Sarah Cook is a 70 year old female with past medical history of dementia, hypertension, anxiety, parathyroid disease presented to hospital with generalized weakness, poor oral appetite, ambulatory dysfunction.  In the ED, patient was noted to be hypertensive.  Potassium was low at 2.0.  Creatinine elevated at 1.3 calcium elevated at 13.4.  Anion gap was 10.  Bilirubin was 1.7.  Patient was negative for COVID and influenza.  Urinalysis showed 21-50 white cells.  Chest x-ray did not show any acute process.  CT head was negative.  Renal ultrasound showed trabeculated bladder which can be seen in the setting of chronic bladder dysfunction.  Patient was then considered for admission to hospital in the setting of UTI, hypokalemia, acute kidney injury, hypercalcemia.   Hospital Course:   Following conditions were addressed during hospitalization as listed below,  E. coli UTI Patient received IV Rocephin during  hospitalization and will be changed to Keflex on discharge to complete the course.   Hypercalcemia:  Resolved. Thought to be secondary to severe dehydration.  Improved with IV fluids.  Encouraged oral hydration.  Hypokalemia Potassium of 3.1 today.  We will give 40 mEq of potassium prior to discharge.  We will continue potassium 20 mg daily for next 5 days   Hypomagnesemia  improved after replacement.  Continue magnesium oxide.  Latest magnesium of 2.1.   Acute renal failure Baseline creatinine 0.6-0.8.  Creatinine today at 1.07.  Received IVF hydration with NS.  Patient should be encouraged to drink oral fluids.   Hypertensive urgency  Continue hydralazine, irbesartan from home.  Could benefit from addition of amlodipine if needed or increase dose of hydralazine.   Hyperlipidemia:  Continue Crestor    Anxiety/depression/Dementia without behavioral disturbance Continue lamictal, delirium precautions.  Patient appears stable at this time   Weakness/deconditioning/debility: Physical therapy  recommended skilled nursing facility placement on discharge.    Disposition.  At this time, patient is stable for disposition to skilled nursing facility.  Tried to reach the patient's spouse but was unable to reach him.  Medical Consultants:   None.  Procedures:    None Subjective:   Today, patient was seen and examined at bedside.  Pleasantly confused.  No interval complaints reported.  Discharge Exam:   Vitals:   10/20/21 2111 10/21/21 0457  BP: (!) 174/93 (!) 175/89  Pulse: (!) 105 95  Resp: 20 16  Temp: 99.1 F (37.3 C) 98.2 F (36.8 C)  SpO2: 96% 96%   Vitals:   10/20/21 0603 10/20/21 1246 10/20/21 2111 10/21/21 0457  BP: Marland Kitchen)  185/81 (!) 180/85 (!) 174/93 (!) 175/89  Pulse: 95 95 (!) 105 95  Resp: 18 15 20 16   Temp: 98.3 F (36.8 C) 97.7 F (36.5 C) 99.1 F (37.3 C) 98.2 F (36.8 C)  TempSrc: Oral Oral Oral Oral  SpO2: 97% 97% 96% 96%  Weight:      Height:         General: Alert awake, not in obvious distress pleasantly confused, trying to sit up in bed. HENT: pupils equally reacting to light,  No scleral pallor or icterus noted. Oral mucosa is moist.  Chest:  Clear breath sounds.  Diminished breath sounds bilaterally. No crackles or wheezes.  CVS: S1 &S2 heard. No murmur.  Regular rate and rhythm. Abdomen: Soft, nontender, nondistended.  Bowel sounds are heard.   Extremities: No cyanosis, clubbing or edema.  Peripheral pulses are palpable. Psych: Alert, awake but confused and disoriented at baseline. CNS:  No cranial nerve deficits.  Power equal in all extremities.   Skin: Warm and dry.  No rashes noted.  The results of significant diagnostics from this hospitalization (including imaging, microbiology, ancillary and laboratory) are listed below for reference.     Diagnostic Studies:   CT Head Wo Contrast  Result Date: 10/18/2021 CLINICAL DATA:  Confusion, weakness, head injury. EXAM: CT HEAD WITHOUT CONTRAST TECHNIQUE: Contiguous axial images were obtained from the base of the skull through the vertex without intravenous contrast. COMPARISON:  October 11, 2017. FINDINGS: Brain: Mild chronic ischemic white matter disease is noted. No mass effect or midline shift is noted. Ventricular size is within normal limits. There is no evidence of mass lesion, hemorrhage or acute infarction. Vascular: No hyperdense vessel or unexpected calcification. Skull: Normal. Negative for fracture or focal lesion. Sinuses/Orbits: No acute finding. Other: None. IMPRESSION: No acute intracranial abnormality seen. Electronically Signed   By: Marijo Conception M.D.   On: 10/18/2021 09:51   US RENAL  Result Date: 10/18/2021 CLINICAL DATA:  AK I EXAM: RENAL / URINARY TRACT ULTRASOUND COMPLETE COMPARISON:  None. FINDINGS: Right Kidney: Renal measurements: 10.1 x 4.4 x 5.4 cm = volume: 124 mL. Echogenicity within normal limits. No mass or hydronephrosis visualized. Left Kidney:  Renal measurements: 9.4 x 5.0 x 5.0 cm = volume: 122 mL. Echogenicity within normal limits. No mass or hydronephrosis visualized. Bladder: Trabeculated bladder wall.  Bilateral jets visualized. Other: Somewhat limited exam due to overlying bowel gas. IMPRESSION: 1. No evidence of hydronephrosis. 2. Trabeculated bladder wall, finding can be seen in the setting of chronic bladder dysfunction. Electronically Signed   By: Yetta Glassman M.D.   On: 10/18/2021 14:41   DG Chest Port 1 View  Result Date: 10/18/2021 CLINICAL DATA:  Weakness EXAM: PORTABLE CHEST 1 VIEW COMPARISON:  07/20/2021 FINDINGS: The heart size and mediastinal contours are within normal limits. Hiatal hernia. Both lungs are clear. The visualized skeletal structures are unremarkable. IMPRESSION: No active disease. Electronically Signed   By: Davina Poke D.O.   On: 10/18/2021 10:28     Labs:   Basic Metabolic Panel: Recent Labs  Lab 10/18/21 0848 10/18/21 1126 10/18/21 1609 10/19/21 0503 10/19/21 1705 10/20/21 0445 10/21/21 0451  NA 144  --  145 143   143  --  143 143  K 2.0*  --  2.6* 2.4*   2.4*   < > 3.1* 3.1*  CL 100  --  104 106   106  --  109 110  CO2 34*  --  29 30  29  --  25 26  GLUCOSE 107*  --  96 100*   101*  --  110* 102*  BUN 17  --  14 11   11   --  8 8  CREATININE 1.39*  --  1.26* 1.17*   1.14*  --  1.07* 1.08*  CALCIUM 13.4*  --  12.4*   13.1* 10.7*   10.7*  --  10.4* 10.0  MG  --  1.8  --  1.6*  --  2.1 2.1  PHOS  --   --  2.1* 2.6  --   --   --    < > = values in this interval not displayed.   GFR Estimated Creatinine Clearance: 44.9 mL/min (A) (by C-G formula based on SCr of 1.08 mg/dL (H)). Liver Function Tests: Recent Labs  Lab 10/18/21 0848 10/18/21 1609 10/19/21 0503  AST 25  --  23  ALT 15  --  14  ALKPHOS 37*  --  40  BILITOT 1.7*  --  1.0  PROT 7.4  --  5.7*  ALBUMIN 4.5 4.2 3.5   3.5   No results for input(s): LIPASE, AMYLASE in the last 168 hours. No results for input(s):  AMMONIA in the last 168 hours. Coagulation profile No results for input(s): INR, PROTIME in the last 168 hours.  CBC: Recent Labs  Lab 10/18/21 0848 10/19/21 0503 10/20/21 0445 10/21/21 0451  WBC 8.3 6.5 8.0 7.7  NEUTROABS 6.1  --   --   --   HGB 11.7* 9.2* 10.1* 9.8*  HCT 32.7* 25.2* 28.1* 27.5*  MCV 89.3 89.0 90.4 90.8  PLT 287 212 175 208   Cardiac Enzymes: No results for input(s): CKTOTAL, CKMB, CKMBINDEX, TROPONINI in the last 168 hours. BNP: Invalid input(s): POCBNP CBG: Recent Labs  Lab 10/18/21 0858  GLUCAP 98   D-Dimer No results for input(s): DDIMER in the last 72 hours. Hgb A1c No results for input(s): HGBA1C in the last 72 hours. Lipid Profile No results for input(s): CHOL, HDL, LDLCALC, TRIG, CHOLHDL, LDLDIRECT in the last 72 hours. Thyroid function studies No results for input(s): TSH, T4TOTAL, T3FREE, THYROIDAB in the last 72 hours.  Invalid input(s): FREET3 Anemia work up No results for input(s): VITAMINB12, FOLATE, FERRITIN, TIBC, IRON, RETICCTPCT in the last 72 hours. Microbiology Recent Results (from the past 240 hour(s))  Resp Panel by RT-PCR (Flu A&B, Covid) Nasopharyngeal Swab     Status: None   Collection Time: 10/18/21  9:26 AM   Specimen: Nasopharyngeal Swab; Nasopharyngeal(NP) swabs in vial transport medium  Result Value Ref Range Status   SARS Coronavirus 2 by RT PCR NEGATIVE NEGATIVE Final    Comment: (NOTE) SARS-CoV-2 target nucleic acids are NOT DETECTED.  The SARS-CoV-2 RNA is generally detectable in upper respiratory specimens during the acute phase of infection. The lowest concentration of SARS-CoV-2 viral copies this assay can detect is 138 copies/mL. A negative result does not preclude SARS-Cov-2 infection and should not be used as the sole basis for treatment or other patient management decisions. A negative result may occur with  improper specimen collection/handling, submission of specimen other than nasopharyngeal swab,  presence of viral mutation(s) within the areas targeted by this assay, and inadequate number of viral copies(<138 copies/mL). A negative result must be combined with clinical observations, patient history, and epidemiological information. The expected result is Negative.  Fact Sheet for Patients:  EntrepreneurPulse.com.au  Fact Sheet for Healthcare Providers:  IncredibleEmployment.be  This test is no t  yet approved or cleared by the [place] and  has been authorized for detection and/or diagnosis of SARS-CoV-2 by FDA under an Emergency Use Authorization (EUA). This EUA will remain  in effect (meaning this test can be used) for the duration of the COVID-19 declaration under Section 564(b)(1) of the Act, 21 U.S.C.section 360bbb-3(b)(1), unless the authorization is terminated  or revoked sooner.       Influenza A by PCR NEGATIVE NEGATIVE Final   Influenza B by PCR NEGATIVE NEGATIVE Final    Comment: (NOTE) The Xpert Xpress SARS-CoV-2/FLU/RSV plus assay is intended as an aid in the diagnosis of influenza from Nasopharyngeal swab specimens and should not be used as a sole basis for treatment. Nasal washings and aspirates are unacceptable for Xpert Xpress SARS-CoV-2/FLU/RSV testing.  Fact Sheet for Patients: EntrepreneurPulse.com.au  Fact Sheet for Healthcare Providers: IncredibleEmployment.be  This test is not yet approved or cleared by the [place] FDA and has been authorized for detection and/or diagnosis of SARS-CoV-2 by FDA under an Emergency Use Authorization (EUA). This EUA will remain in effect (meaning this test can be used) for the duration of the COVID-19 declaration under Section 564(b)(1) of the Act, 21 U.S.C. section 360bbb-3(b)(1), unless the authorization is terminated or revoked.  Performed at [facility], [place] [address], [place], [place] [address]   Urine  Culture     Status: Abnormal   Collection Time: 10/18/21  9:26 AM   Specimen: Urine, Clean Catch  Result Value Ref Range Status   Specimen Description   Final    URINE, CLEAN CATCH Performed at [facility], [place] [address] [place], [place] [address]    Special Requests   Final    NONE Performed at [facility], [place] [address] [place], [place] [address]    Culture >=100,000 COLONIES/mL ESCHERICHIA COLI (A)  Final   Report Status 10/20/2021 FINAL  Final   Organism ID, Bacteria ESCHERICHIA COLI (A)  Final      Susceptibility   Escherichia coli - MIC*    AMPICILLIN 4 SENSITIVE Sensitive     CEFAZOLIN <=4 SENSITIVE Sensitive     CEFEPIME <=0.12 SENSITIVE Sensitive     CEFTRIAXONE <=0.25 SENSITIVE Sensitive     CIPROFLOXACIN <=0.25 SENSITIVE Sensitive     GENTAMICIN <=1 SENSITIVE Sensitive     IMIPENEM <=0.25 SENSITIVE Sensitive     NITROFURANTOIN <=16 SENSITIVE Sensitive     TRIMETH/SULFA <=20 SENSITIVE Sensitive     AMPICILLIN/SULBACTAM <=2 SENSITIVE Sensitive     PIP/TAZO <=4 SENSITIVE Sensitive     * >=100,000 COLONIES/mL ESCHERICHIA COLI     Discharge Instructions:   Discharge Instructions     Call MD for:  persistant nausea and vomiting   Complete by: As directed    Call MD for:  temperature >100.4   Complete by: As directed    Diet general   Complete by: As directed    Discharge instructions   Complete by: As directed    Follow-up with your primary care provider at the skilled nursing facility in 3 to 5 days.  Check blood work at that time.  Complete course of antibiotic.   Increase activity slowly   Complete by: As directed       Allergies as of 10/21/2021       Reactions   Anesthetics, Ester Nausea And Vomiting   Codeine Nausea Only   Tape Other (See Comments)   Skin blisters   Amlodipine Other (See  Comments), Swelling   Cetirizine Hcl    Other reaction(s): altered consciousness   Hydrochlorothiazide Other (See  Comments)   Hydrocodone Nausea And Vomiting   Onion    Does not like   Latex Itching, Rash   Irritates skin        Medication List     TAKE these medications    acetaminophen 650 MG CR tablet Commonly known as: TYLENOL Take 1 tablet (650 mg total) by mouth every 8 (eight) hours as needed for pain. What changed:  how much to take when to take this reasons to take this   cephALEXin 500 MG capsule Commonly known as: KEFLEX Take 1 capsule (500 mg total) by mouth every 12 (twelve) hours for 3 days.   CO Q 10 PO Take 1 tablet by mouth daily.   docusate sodium 100 MG capsule Commonly known as: COLACE Take 100 mg by mouth daily.   feeding supplement Liqd Take 237 mLs by mouth 2 (two) times daily between meals.   hydrALAZINE 25 MG tablet Commonly known as: APRESOLINE Take 1.5 tablets (37.5 mg total) by mouth 3 (three) times daily.   irbesartan 300 MG tablet Commonly known as: AVAPRO Take 300 mg by mouth daily.   lamoTRIgine 100 MG tablet Commonly known as: LaMICtal Take 1 tablet (100 mg total) by mouth 2 (two) times daily.   magnesium oxide 400 MG tablet Commonly known as: MAG-OX Take 400 mg by mouth daily.   multivitamin with minerals Tabs tablet Take 1 tablet by mouth daily.   OVER THE COUNTER MEDICATION Take 10 mg by mouth at bedtime. Tumeric Black pepper 5 mg   potassium chloride SA 20 MEQ tablet Commonly known as: KLOR-CON M Take 1 tablet (20 mEq total) by mouth daily for 5 days.   PROBIOTIC PO Take 1 capsule by mouth daily.   rosuvastatin 10 MG tablet Commonly known as: CRESTOR Take 10 mg by mouth daily.   VITAMIN C PO Take 1 tablet by mouth daily.   Vitamin D3 50 MCG (2000 UT) Tabs Take 2,000 Units by mouth daily.          Time coordinating discharge: 39 minutes  Signed:  Tenika Keeran  [facility] 10/21/2021, 10:19 AM

## 2021-10-21 NOTE — Progress Notes (Signed)
PTAR present to transfer pt to [facility] [phone].

## 2021-10-21 NOTE — Progress Notes (Signed)
Physical Therapy Treatment Patient Details Name: Sarah Cook MRN: [id number] DOB: Jan 03, 1951 Today's Date: 10/21/2021   History of Present Illness 70 yo female admitted with hypercalcemia, hypokalemia, weakness, worsened confusion. Hx of dementia, anxiety, incontinence, TIA, breat ca, gait abnormality, CHF    PT Comments    +2 min assist for sit to stand and to take a few pivotal steps to bedside commode and then to recliner. Pt incontinent of bowel during transfers. Manual and verbal cues required for safety. Pt oriented to self only. Improved balance and activity tolerance today compared to prior PT session.     Recommendations for follow up therapy are one component of a multi-disciplinary discharge planning process, led by the attending physician.  Recommendations may be updated based on patient status, additional functional criteria and insurance authorization.  Follow Up Recommendations  Skilled nursing-short term rehab (<3 hours/day)     Assistance Recommended at Discharge Frequent or constant Supervision/Assistance  Equipment Recommendations  None recommended by PT    Recommendations for Other Services       Precautions / Restrictions Precautions Precautions: Fall Precaution Comments: incontinent Restrictions Weight Bearing Restrictions: No     Mobility  Bed Mobility   Bed Mobility: Supine to Sit     Supine to sit: Min assist     General bed mobility comments: min A to raise trunk, multimodal cues for technique    Transfers Overall transfer level: Needs assistance Equipment used: Rolling walker (2 wheels) Transfers: Sit to/from Stand;Bed to chair/wheelchair/BSC Sit to Stand: +2 physical assistance;+2 safety/equipment;Min assist     Step pivot transfers: Min assist;+2 safety/equipment;+2 physical assistance     General transfer comment: assist to rise and steady, multimodal cues for hand position and safety, pt took a few pivotal steps to 3 in 1 and  then to recliner with RW    Ambulation/Gait                   Stairs             Wheelchair Mobility    Modified Rankin (Stroke Patients Only)       Balance   Sitting-balance support: Feet supported Sitting balance-Leahy Scale: Fair     Standing balance support: Bilateral upper extremity supported Standing balance-Leahy Scale: Poor                              Cognition Arousal/Alertness: Awake/alert Behavior During Therapy: WFL for tasks assessed/performed Overall Cognitive Status: History of cognitive impairments - at baseline                                 General Comments: patient follows one step commands inconsistently, patient oriented to self.        Exercises      General Comments        Pertinent Vitals/Pain Faces Pain Scale: No hurt    Home Living                          Prior Function            PT Goals (current goals can now be found in the care plan section) Acute Rehab PT Goals Patient Stated Goal: pt unable to state. no family present PT Goal Formulation: Patient unable to participate in goal setting Time For Goal Achievement: 11/02/21 Potential  to Achieve Goals: Fair Progress towards PT goals: Progressing toward goals    Frequency    Min 3X/week      PT Plan Current plan remains appropriate    Co-evaluation              AM-PAC PT "6 Clicks" Mobility   Outcome Measure  Help needed turning from your back to your side while in a flat bed without using bedrails?: A Lot Help needed moving from lying on your back to sitting on the side of a flat bed without using bedrails?: A Little Help needed moving to and from a bed to a chair (including a wheelchair)?: A Lot Help needed standing up from a chair using your arms (e.g., wheelchair or bedside chair)?: A Lot Help needed to walk in hospital room?: Total Help needed climbing 3-5 steps with a railing? : Total 6 Click  Score: 11    End of Session Equipment Utilized During Treatment: Gait belt Activity Tolerance: Patient tolerated treatment well Patient left: with call bell/phone within reach;in chair;with chair alarm set;with nursing/sitter in room Nurse Communication: Mobility status PT Visit Diagnosis: Muscle weakness (generalized) (M62.81);Difficulty in walking, not elsewhere classified (R26.2);Other abnormalities of gait and mobility (R26.89)     Time: 8325-4982 PT Time Calculation (min) (ACUTE ONLY): 20 min  Charges:  $Therapeutic Activity: 8-22 mins                     Blondell Reveal Kistler PT 10/21/2021  Acute Rehabilitation Services Pager [phone] Office [phone]

## 2021-10-27 DIAGNOSIS — R410 Disorientation, unspecified: Secondary | ICD-10-CM | POA: Diagnosis not present

## 2021-10-27 DIAGNOSIS — F0390 Unspecified dementia without behavioral disturbance: Secondary | ICD-10-CM | POA: Diagnosis not present

## 2021-10-27 DIAGNOSIS — M6281 Muscle weakness (generalized): Secondary | ICD-10-CM | POA: Diagnosis not present

## 2021-10-27 DIAGNOSIS — R5383 Other fatigue: Secondary | ICD-10-CM | POA: Diagnosis not present

## 2021-10-28 DIAGNOSIS — I129 Hypertensive chronic kidney disease with stage 1 through stage 4 chronic kidney disease, or unspecified chronic kidney disease: Secondary | ICD-10-CM | POA: Diagnosis not present

## 2021-10-28 DIAGNOSIS — F0390 Unspecified dementia without behavioral disturbance: Secondary | ICD-10-CM | POA: Diagnosis not present

## 2021-10-28 DIAGNOSIS — R262 Difficulty in walking, not elsewhere classified: Secondary | ICD-10-CM | POA: Diagnosis not present

## 2021-10-28 DIAGNOSIS — R531 Weakness: Secondary | ICD-10-CM | POA: Diagnosis not present

## 2021-10-29 DIAGNOSIS — M6281 Muscle weakness (generalized): Secondary | ICD-10-CM | POA: Diagnosis not present

## 2021-10-29 DIAGNOSIS — R2689 Other abnormalities of gait and mobility: Secondary | ICD-10-CM | POA: Diagnosis not present

## 2021-10-29 DIAGNOSIS — F0390 Unspecified dementia without behavioral disturbance: Secondary | ICD-10-CM | POA: Diagnosis not present

## 2021-11-04 DIAGNOSIS — I5032 Chronic diastolic (congestive) heart failure: Secondary | ICD-10-CM | POA: Diagnosis not present

## 2021-11-04 DIAGNOSIS — F0390 Unspecified dementia without behavioral disturbance: Secondary | ICD-10-CM | POA: Diagnosis not present

## 2021-11-04 DIAGNOSIS — R296 Repeated falls: Secondary | ICD-10-CM | POA: Diagnosis not present

## 2021-11-04 DIAGNOSIS — R2689 Other abnormalities of gait and mobility: Secondary | ICD-10-CM | POA: Diagnosis not present

## 2021-11-04 DIAGNOSIS — F411 Generalized anxiety disorder: Secondary | ICD-10-CM | POA: Diagnosis not present

## 2021-11-04 DIAGNOSIS — F32A Depression, unspecified: Secondary | ICD-10-CM | POA: Diagnosis not present

## 2021-11-04 DIAGNOSIS — M6281 Muscle weakness (generalized): Secondary | ICD-10-CM | POA: Diagnosis not present

## 2021-11-04 DIAGNOSIS — Z8673 Personal history of transient ischemic attack (TIA), and cerebral infarction without residual deficits: Secondary | ICD-10-CM | POA: Diagnosis not present

## 2021-11-06 ENCOUNTER — Other Ambulatory Visit: Payer: Self-pay

## 2021-11-06 ENCOUNTER — Observation Stay (HOSPITAL_COMMUNITY)
Admission: EM | Admit: 2021-11-06 | Discharge: 2021-11-07 | Disposition: A | Discharge status: Home / Self Care | Payer: Medicare PPO | Attending: Student | Admitting: Student

## 2021-11-06 ENCOUNTER — Encounter (HOSPITAL_COMMUNITY): Payer: Self-pay | Admitting: Emergency Medicine

## 2021-11-06 ENCOUNTER — Emergency Department (HOSPITAL_COMMUNITY): Payer: Medicare PPO

## 2021-11-06 DIAGNOSIS — I6782 Cerebral ischemia: Secondary | ICD-10-CM | POA: Diagnosis not present

## 2021-11-06 DIAGNOSIS — Z79899 Other long term (current) drug therapy: Secondary | ICD-10-CM | POA: Insufficient documentation

## 2021-11-06 DIAGNOSIS — G9340 Encephalopathy, unspecified: Secondary | ICD-10-CM | POA: Diagnosis not present

## 2021-11-06 DIAGNOSIS — R2689 Other abnormalities of gait and mobility: Secondary | ICD-10-CM | POA: Diagnosis not present

## 2021-11-06 DIAGNOSIS — C50919 Malignant neoplasm of unspecified site of unspecified female breast: Secondary | ICD-10-CM | POA: Diagnosis not present

## 2021-11-06 DIAGNOSIS — E876 Hypokalemia: Secondary | ICD-10-CM | POA: Diagnosis present

## 2021-11-06 DIAGNOSIS — I10 Essential (primary) hypertension: Secondary | ICD-10-CM | POA: Diagnosis not present

## 2021-11-06 DIAGNOSIS — G9341 Metabolic encephalopathy: Secondary | ICD-10-CM | POA: Diagnosis not present

## 2021-11-06 DIAGNOSIS — E785 Hyperlipidemia, unspecified: Secondary | ICD-10-CM | POA: Diagnosis present

## 2021-11-06 DIAGNOSIS — I160 Hypertensive urgency: Secondary | ICD-10-CM | POA: Diagnosis present

## 2021-11-06 DIAGNOSIS — I5032 Chronic diastolic (congestive) heart failure: Secondary | ICD-10-CM | POA: Diagnosis present

## 2021-11-06 DIAGNOSIS — I110 Hypertensive heart disease with heart failure: Secondary | ICD-10-CM | POA: Diagnosis not present

## 2021-11-06 DIAGNOSIS — K449 Diaphragmatic hernia without obstruction or gangrene: Secondary | ICD-10-CM | POA: Diagnosis not present

## 2021-11-06 DIAGNOSIS — R404 Transient alteration of awareness: Secondary | ICD-10-CM | POA: Diagnosis not present

## 2021-11-06 DIAGNOSIS — Z91040 Latex allergy status: Secondary | ICD-10-CM | POA: Insufficient documentation

## 2021-11-06 DIAGNOSIS — Z20822 Contact with and (suspected) exposure to covid-19: Secondary | ICD-10-CM | POA: Diagnosis not present

## 2021-11-06 DIAGNOSIS — R410 Disorientation, unspecified: Secondary | ICD-10-CM | POA: Diagnosis not present

## 2021-11-06 DIAGNOSIS — Z853 Personal history of malignant neoplasm of breast: Secondary | ICD-10-CM | POA: Diagnosis not present

## 2021-11-06 DIAGNOSIS — I2699 Other pulmonary embolism without acute cor pulmonale: Secondary | ICD-10-CM | POA: Diagnosis present

## 2021-11-06 DIAGNOSIS — F0390 Unspecified dementia without behavioral disturbance: Secondary | ICD-10-CM | POA: Diagnosis present

## 2021-11-06 DIAGNOSIS — D649 Anemia, unspecified: Secondary | ICD-10-CM | POA: Diagnosis present

## 2021-11-06 DIAGNOSIS — E7800 Pure hypercholesterolemia, unspecified: Secondary | ICD-10-CM | POA: Diagnosis not present

## 2021-11-06 DIAGNOSIS — G319 Degenerative disease of nervous system, unspecified: Secondary | ICD-10-CM | POA: Diagnosis not present

## 2021-11-06 DIAGNOSIS — R509 Fever, unspecified: Secondary | ICD-10-CM | POA: Diagnosis not present

## 2021-11-06 DIAGNOSIS — R4182 Altered mental status, unspecified: Secondary | ICD-10-CM | POA: Diagnosis not present

## 2021-11-06 LAB — ETHANOL: Alcohol, Ethyl (B): <10 mg/dL (ref <10)

## 2021-11-06 LAB — DIFFERENTIAL
Abs Immature Granulocytes: 0.03 10*3/uL (ref 0.00–0.07)
Basophils Absolute: 0.1 10*3/uL (ref 0.0–0.1)
Basophils Relative: 1 %
Eosinophils Absolute: 0.1 10*3/uL (ref 0.0–0.5)
Eosinophils Relative: 1 %
Immature Granulocytes: 0 %
Lymphocytes Relative: 22 %
Lymphs Abs: 1.8 10*3/uL (ref 0.7–4.0)
Monocytes Absolute: 0.5 10*3/uL (ref 0.1–1.0)
Monocytes Relative: 6 %
Neutro Abs: 5.7 10*3/uL (ref 1.7–7.7)
Neutrophils Relative %: 70 %

## 2021-11-06 LAB — BLOOD GAS, VENOUS
Acid-Base Excess: 1.4 mmol/L (ref 0.0–2.0)
Bicarbonate: 25.4 mmol/L (ref 20.0–28.0)
O2 Saturation: 92.4 %
Patient temperature: 98.6
pCO2, Ven: 39.6 mmHg — ABNORMAL LOW (ref 44.0–60.0)
pH, Ven: 7.423 (ref 7.250–7.430)
pO2, Ven: 64.2 mmHg — ABNORMAL HIGH (ref 32.0–45.0)

## 2021-11-06 LAB — COMPREHENSIVE METABOLIC PANEL
ALT: 12 U/L (ref 0–44)
AST: 18 U/L (ref 15–41)
Albumin: 4.5 g/dL (ref 3.5–5.0)
Alkaline Phosphatase: 50 U/L (ref 38–126)
Anion gap: 10 (ref 5–15)
BUN: 18 mg/dL (ref 8–23)
CO2: 25 mmol/L (ref 22–32)
Calcium: 11.1 mg/dL — ABNORMAL HIGH (ref 8.9–10.3)
Chloride: 104 mmol/L (ref 98–111)
Creatinine, Ser: 0.99 mg/dL (ref 0.44–1.00)
GFR, Estimated: >60 mL/min (ref >60)
Glucose, Bld: 106 mg/dL — ABNORMAL HIGH (ref 70–99)
Potassium: 3.2 mmol/L — ABNORMAL LOW (ref 3.5–5.1)
Sodium: 139 mmol/L (ref 135–145)
Total Bilirubin: 1.8 mg/dL — ABNORMAL HIGH (ref 0.3–1.2)
Total Protein: 7.5 g/dL (ref 6.5–8.1)

## 2021-11-06 LAB — CREATININE, URINE, RANDOM: Creatinine, Urine: 94.94 mg/dL

## 2021-11-06 LAB — CBC WITH DIFFERENTIAL/PLATELET
Abs Immature Granulocytes: 0.03 10*3/uL (ref 0.00–0.07)
Basophils Absolute: 0.1 10*3/uL (ref 0.0–0.1)
Basophils Relative: 1 %
Eosinophils Absolute: 0 10*3/uL (ref 0.0–0.5)
Eosinophils Relative: 0 %
HCT: 34.2 % — ABNORMAL LOW (ref 36.0–46.0)
Hemoglobin: 11.7 g/dL — ABNORMAL LOW (ref 12.0–15.0)
Immature Granulocytes: 0 %
Lymphocytes Relative: 13 %
Lymphs Abs: 1.2 10*3/uL (ref 0.7–4.0)
MCH: 32.6 pg (ref 26.0–34.0)
MCHC: 34.2 g/dL (ref 30.0–36.0)
MCV: 95.3 fL (ref 80.0–100.0)
Monocytes Absolute: 0.6 10*3/uL (ref 0.1–1.0)
Monocytes Relative: 6 %
Neutro Abs: 7.4 10*3/uL (ref 1.7–7.7)
Neutrophils Relative %: 80 %
Platelets: 369 10*3/uL (ref 150–400)
RBC: 3.59 MIL/uL — ABNORMAL LOW (ref 3.87–5.11)
RDW: 13.7 % (ref 11.5–15.5)
WBC: 9.3 10*3/uL (ref 4.0–10.5)
nRBC: 0 % (ref 0.0–0.2)

## 2021-11-06 LAB — URINALYSIS, ROUTINE W REFLEX MICROSCOPIC
Bilirubin Urine: NEGATIVE
Glucose, UA: NEGATIVE mg/dL
Hgb urine dipstick: NEGATIVE
Ketones, ur: 5 mg/dL — AB
Nitrite: NEGATIVE
Protein, ur: NEGATIVE mg/dL
Specific Gravity, Urine: 1.013 (ref 1.005–1.030)
pH: 6 (ref 5.0–8.0)

## 2021-11-06 LAB — RAPID URINE DRUG SCREEN, HOSP PERFORMED
Amphetamines: NOT DETECTED
Barbiturates: NOT DETECTED
Benzodiazepines: NOT DETECTED
Cocaine: NOT DETECTED
Opiates: NOT DETECTED
Tetrahydrocannabinol: NOT DETECTED

## 2021-11-06 LAB — PROTIME-INR
INR: 1 (ref 0.8–1.2)
Prothrombin Time: 12.7 seconds (ref 11.4–15.2)

## 2021-11-06 LAB — RESP PANEL BY RT-PCR (FLU A&B, COVID) ARPGX2
Influenza A by PCR: NEGATIVE
Influenza B by PCR: NEGATIVE
SARS Coronavirus 2 by RT PCR: NEGATIVE

## 2021-11-06 LAB — SODIUM, URINE, RANDOM: Sodium, Ur: 65 mmol/L

## 2021-11-06 LAB — CBG MONITORING, ED: Glucose-Capillary: 105 mg/dL — ABNORMAL HIGH (ref 70–99)

## 2021-11-06 LAB — AMMONIA: Ammonia: 13 umol/L (ref 9–35)

## 2021-11-06 MED ORDER — LABETALOL HCL 5 MG/ML IV SOLN
10.0000 mg | INTRAVENOUS | Status: DC | PRN
  Administered 2021-11-06 (×2): 10 mg via INTRAVENOUS
  Filled 2021-11-06 (×2): qty 4

## 2021-11-06 MED ORDER — LAMOTRIGINE 100 MG PO TABS
100.0000 mg | ORAL_TABLET | Freq: Two times a day (BID) | ORAL | Status: DC
  Administered 2021-11-06 – 2021-11-07 (×2): 100 mg via ORAL
  Filled 2021-11-06 (×2): qty 1

## 2021-11-06 MED ORDER — THIAMINE HCL 100 MG/ML IJ SOLN
100.0000 mg | Freq: Every day | INTRAMUSCULAR | Status: DC
  Administered 2021-11-06 – 2021-11-07 (×2): 100 mg via INTRAVENOUS
  Filled 2021-11-06 (×2): qty 2

## 2021-11-06 MED ORDER — ACETAMINOPHEN 650 MG RE SUPP: 650.0000 mg | Freq: Four times a day (QID) | RECTAL | Status: DC | PRN

## 2021-11-06 MED ORDER — HYDRALAZINE HCL 25 MG PO TABS
37.5000 mg | ORAL_TABLET | Freq: Three times a day (TID) | ORAL | Status: DC
  Administered 2021-11-06 – 2021-11-07 (×3): 37.5 mg via ORAL
  Filled 2021-11-06 (×3): qty 2

## 2021-11-06 MED ORDER — IRBESARTAN 300 MG PO TABS
300.0000 mg | ORAL_TABLET | Freq: Every day | ORAL | Status: DC
  Administered 2021-11-06 – 2021-11-07 (×2): 300 mg via ORAL
  Filled 2021-11-06 (×2): qty 1

## 2021-11-06 MED ORDER — ACETAMINOPHEN 325 MG PO TABS: 650.0000 mg | ORAL_TABLET | Freq: Four times a day (QID) | ORAL | Status: DC | PRN

## 2021-11-06 MED ORDER — SODIUM CHLORIDE 0.9 % IV SOLN
75.0000 mL/h | INTRAVENOUS | Status: AC
  Administered 2021-11-06: 75 mL/h via INTRAVENOUS

## 2021-11-06 MED ORDER — POTASSIUM CHLORIDE CRYS ER 20 MEQ PO TBCR
40.0000 meq | EXTENDED_RELEASE_TABLET | Freq: Once | ORAL | Status: AC
  Administered 2021-11-06: 40 meq via ORAL
  Filled 2021-11-06: qty 2

## 2021-11-06 MED ORDER — ENOXAPARIN SODIUM 40 MG/0.4ML IJ SOSY
40.0000 mg | PREFILLED_SYRINGE | INTRAMUSCULAR | Status: DC
  Filled 2021-11-06: qty 0.4

## 2021-11-06 MED ORDER — MAGNESIUM OXIDE -MG SUPPLEMENT 400 (240 MG) MG PO TABS
400.0000 mg | ORAL_TABLET | Freq: Every day | ORAL | Status: DC
  Administered 2021-11-06 – 2021-11-07 (×2): 400 mg via ORAL
  Filled 2021-11-06 (×2): qty 1

## 2021-11-06 MED ORDER — ENSURE ENLIVE PO LIQD
237.0000 mL | Freq: Two times a day (BID) | ORAL | Status: DC
  Filled 2021-11-06 (×2): qty 237

## 2021-11-06 MED ORDER — HYDRALAZINE HCL 25 MG PO TABS
50.0000 mg | ORAL_TABLET | Freq: Once | ORAL | Status: AC
  Administered 2021-11-06: 50 mg via ORAL
  Filled 2021-11-06: qty 2

## 2021-11-06 MED ORDER — POTASSIUM CHLORIDE 10 MEQ/100ML IV SOLN
10.0000 meq | INTRAVENOUS | Status: AC
  Administered 2021-11-06 (×2): 10 meq via INTRAVENOUS
  Filled 2021-11-06 (×2): qty 100

## 2021-11-06 MED ORDER — ROSUVASTATIN CALCIUM 10 MG PO TABS
10.0000 mg | ORAL_TABLET | Freq: Every day | ORAL | Status: DC
  Administered 2021-11-06 – 2021-11-07 (×2): 10 mg via ORAL
  Filled 2021-11-06 (×2): qty 1

## 2021-11-06 NOTE — Assessment & Plan Note (Signed)
Patient recurrent presentations of slightly altered mental status in the setting of hypertension and blood pressure improves her mental status is improved.  Continue to monitor.  BP now in reasonable range.  Unclear why amlodipine was stopped with consider restarting.  Continue Avapro and hydralazine labetalol as needed

## 2021-11-06 NOTE — ED Provider Notes (Signed)
Emergency Medicine Provider Triage Evaluation Note  Sarah Cook , a 70 y.o. female  was evaluated in triage.  Pt presents via EMS with concern for altered mental status.  Unclear when last known well was.  Patient unable to contribute any history for me, knows she is in the hospital but is unsure why.  Review of Systems  Positive: ALOC Negative:  Unable to complete ROS due to altered mental status  Physical Exam  BP (!) 183/93 (BP Location: Left Arm)    Pulse (!) 109    Temp 98.6 F (37 C) (Oral)    Resp 16    Ht 5\' 1"  (1.549 m)    Wt 77.2 kg    SpO2 95%    BMI 32.16 kg/m  Gen:   Awake, no distress   Resp:  Normal effort  MSK:   Moves extremities without difficulty  Other:  Tachycardic with regular rhythm, no murmur/gallops/rubs.  Abdomen soft, nondistended, nontender.  PERRL, EOMI, left upper extremity tremor with intention but no focal deficit on neuro exam.  Medical Decision Making  Medically screening exam initiated at 1:24 PM.  Appropriate orders placed.  Reece Packer was informed that the remainder of the evaluation will be completed by another provider, this initial triage assessment does not replace that evaluation, and the importance of remaining in the ED until their evaluation is complete.  This chart was dictated using voice recognition software, Dragon. Despite the best efforts of this provider to proofread and correct errors, errors may still occur which can change documentation meaning.    Emeline Darling, PA-C 11/06/21 1325    Daleen Bo, MD 11/06/21 2233

## 2021-11-06 NOTE — H&P (Signed)
Sarah Cook LDJ:[id number] DOB: 1951-09-18 DOA: 11/06/2021     PCP: Cari Caraway, MD   Outpatient Specialists:  CARDS: Dr.Croitoru NEurology Dr.Yan   Patient arrived to ER on 11/06/21 at 1257 Referred by Attending Toy Baker, MD   Patient coming from: home Lives   With family    Chief Complaint:   Chief Complaint  Patient presents with   Altered Mental Status    HPI: Sarah Cook is a 70 y.o. female with medical history significant of dementia hypertension, HLD, parathyroid disease with hypercalcemia    Presented with   confusion Comes from home recently was discharged from [facility] health care where she was taken off of amlodipine. Noted to be tachycardic on EMS arrival At baseline has dementia Has been stated in the past she has had episodes of confusion weakness when her blood pressure was high.  When her home health nurse checked her blood pressure today was up to 190.  Patient also did not describe any headache or shortness of breath or chest pain.  Family report no current urinary complaints no falls  Last admission was in the beginning of December where she was found to be hypokalemic with elevated calcium up to 13.4 and UTI which was treated with Rocephin protocol hypercalcemia thought to be secondary to severe dehydration improved with IV fluids.  Patient was treated for hypertensive urgency with hydralazine either Sartain from home and her amlodipine was added No chest pain   Has been vaccinated against COVID  had  flu shot   Initial COVID TEST  NEGATIVE   Lab Results  Component Value Date   SARSCOV2NAA NEGATIVE 11/06/2021   [place] NEGATIVE 10/21/2021   [place] NEGATIVE 10/18/2021   [place] NEGATIVE 07/26/2021     Regarding pertinent Chronic problems:   Hyperlipidemia -  on statins Crestor Lipid Panel     Component Value Date/Time   CHOL 195 10/12/2017 0241   TRIG 145 10/12/2017 0241   HDL 40 (L) 10/12/2017 0241    CHOLHDL 4.9 10/12/2017 0241   VLDL 29 10/12/2017 0241   LDLCALC 126 (H) 10/12/2017 0241      HTN on hydralazine IV Sartain recently came off of Norvasc   chronic CHF diastolic - last echo 0300 showing grade 1 diastolic dysfunction   obesity-   BMI Readings from Last 1 Encounters:  11/06/21 32.16 kg/m       Dementia - on lamictal   Chronic anemia - baseline hg Hemoglobin & Hematocrit  Recent Labs    10/20/21 0445 10/21/21 0451 11/06/21 1335  HGB 10.1* 9.8* 11.7*    While in ER: Clinical Course as of 11/06/21 2057  Sat Nov 06, 2021  2028 MCV: 95.3 [EW]    Clinical Course User Index [EW] Daleen Bo, MD   CT HEAD   NON acute  CXR -  NON acute   Following Medications were ordered in ER: Medications  labetalol (NORMODYNE) injection 10 mg (10 mg Intravenous Given 11/06/21 2007)  potassium chloride SA (KLOR-CON M) CR tablet 40 mEq (40 mEq Oral Given 11/06/21 1636)  potassium chloride 10 mEq in 100 mL IVPB (0 mEq Intravenous Stopped 11/06/21 1927)  hydrALAZINE (APRESOLINE) tablet 50 mg (50 mg Oral Given 11/06/21 1926)      ED Triage Vitals  Enc Vitals Group     BP 11/06/21 1305 (!) 183/93     Pulse Rate 11/06/21 1305 (!) 109     Resp 11/06/21 1305 16     Temp  11/06/21 1305 98.6 F (37 C)     Temp Source 11/06/21 1305 Oral     SpO2 11/06/21 1305 95 %     Weight 11/06/21 1307 170 lb 3.1 oz (77.2 kg)     Height 11/06/21 1307 5\' 1"  (1.549 m)     Head Circumference --      Peak Flow --      Pain Score 11/06/21 1307 0     Pain Loc --      Pain Edu? --      Excl. in [place]? --   TMAX(24)@     _________________________________________ Significant initial  Findings: Abnormal Labs Reviewed  COMPREHENSIVE METABOLIC PANEL - Abnormal; Notable for the following components:      Result Value   Potassium 3.2 (*)    Glucose, Bld 106 (*)    Calcium 11.1 (*)    Total Bilirubin 1.8 (*)    All other components within normal limits  CBC WITH DIFFERENTIAL/PLATELET - Abnormal;  Notable for the following components:   RBC 3.59 (*)    Hemoglobin 11.7 (*)    HCT 34.2 (*)    All other components within normal limits  URINALYSIS, ROUTINE W REFLEX MICROSCOPIC - Abnormal; Notable for the following components:   APPearance HAZY (*)    Ketones, ur 5 (*)    Leukocytes,Ua SMALL (*)    Bacteria, UA FEW (*)    All other components within normal limits  CBG MONITORING, ED - Abnormal; Notable for the following components:   Glucose-Capillary 105 (*)    All other components within normal limits     _________________________ Troponin  ordered ECG: Ordered Personally reviewed by me showing: HR : 110 Rhythm:  Sinus tachycardia    no evidence of ischemic changes QTC 458    The recent clinical data is shown below. Vitals:   11/06/21 1830 11/06/21 1926 11/06/21 1930 11/06/21 2001  BP: (!) 154/85 (!) 174/89 (!) 178/94 (!) 169/98  Pulse: 87  88 93  Resp: (!) 24  17 (!) 23  Temp:      TempSrc:      SpO2: 95%  97% 97%  Weight:      Height:          WBC     Component Value Date/Time   WBC 9.3 11/06/2021 1335   LYMPHSABS 1.2 11/06/2021 1335   MONOABS 0.6 11/06/2021 1335   EOSABS 0.0 11/06/2021 1335   BASOSABS 0.1 11/06/2021 1335       UA   no evidence of UTI      Urine analysis:    Component Value Date/Time   COLORURINE YELLOW 11/06/2021 1324   APPEARANCEUR HAZY (A) 11/06/2021 1324   LABSPEC 1.013 11/06/2021 1324   PHURINE 6.0 11/06/2021 1324   GLUCOSEU NEGATIVE 11/06/2021 1324   HGBUR NEGATIVE 11/06/2021 1324   BILIRUBINUR NEGATIVE 11/06/2021 1324   KETONESUR 5 (A) 11/06/2021 1324   PROTEINUR NEGATIVE 11/06/2021 1324   UROBILINOGEN 0.2 02/20/2009 1445   NITRITE NEGATIVE 11/06/2021 1324   LEUKOCYTESUR SMALL (A) 11/06/2021 1324    Results for orders placed or performed during the hospital encounter of 11/06/21  Resp Panel by RT-PCR (Flu A&B, Covid) Nasopharyngeal Swab     Status: None   Collection Time: 11/06/21  4:17 PM   Specimen:  Nasopharyngeal Swab; Nasopharyngeal(NP) swabs in vial transport medium  Result Value Ref Range Status   SARS Coronavirus 2 by RT PCR NEGATIVE NEGATIVE Final  Influenza A by PCR NEGATIVE NEGATIVE Final   Influenza B by PCR NEGATIVE NEGATIVE Final          _______________________________________________ Hospitalist was called for admission for acute encephalopathy  The following Work up has been ordered so far:  Orders Placed This Encounter  Procedures   Resp Panel by RT-PCR (Flu A&B, Covid) Nasopharyngeal Swab   CT HEAD WO CONTRAST (5MM)   DG Chest 2 View   Comprehensive metabolic panel   CBC WITH DIFFERENTIAL   Urinalysis, Routine w reflex microscopic   Ammonia   Urine rapid drug screen (hosp performed)   Ethanol   Protime-INR   Calcium, ionized   Cardiac monitoring   Initiate Carrier Fluid Protocol   Cardiac monitoring   Consult to hospitalist   Pulse oximetry, continuous   CBG monitoring, ED   ED EKG   Insert peripheral IV   Place in observation (patient's expected length of stay will be less than 2 midnights)     OTHER Significant initial  Findings:  labs showing:    Recent Labs  Lab 11/06/21 1335  NA 139  K 3.2*  CO2 25  GLUCOSE 106*  BUN 18  CREATININE 0.99  CALCIUM 11.1*    Cr  stable,  Up from baseline see below Lab Results  Component Value Date   CREATININE 0.99 11/06/2021   CREATININE 1.08 (H) 10/21/2021   CREATININE 1.07 (H) 10/20/2021    Recent Labs  Lab 11/06/21 1335  AST 18  ALT 12  ALKPHOS 50  BILITOT 1.8*  PROT 7.5  ALBUMIN 4.5   Lab Results  Component Value Date   CALCIUM 11.1 (H) 11/06/2021   PHOS 2.6 10/19/2021    Plt: Lab Results  Component Value Date   PLT 369 11/06/2021       COVID-19 Labs  No results for input(s): DDIMER, FERRITIN, LDH, CRP in the last 72 hours.  Lab Results  Component Value Date   SARSCOV2NAA NEGATIVE 11/06/2021   [place] NEGATIVE 10/21/2021   SARSCOV2NAA NEGATIVE 10/18/2021    SARSCOV2NAA NEGATIVE 07/26/2021     Venous  Blood Gas ordered  ABG    Component Value Date/Time   TCO2 25 05/04/2016 0807         Recent Labs  Lab 11/06/21 1335  WBC 9.3  NEUTROABS 7.4  HGB 11.7*  HCT 34.2*  MCV 95.3  PLT 369    HG/HCT  stable,      Component Value Date/Time   HGB 11.7 (L) 11/06/2021 1335   HCT 34.2 (L) 11/06/2021 1335   MCV 95.3 11/06/2021 1335      No results for input(s): LIPASE, AMYLASE in the last 168 hours. Recent Labs  Lab 11/06/21 1335  AMMONIA 13      DM  labs:  HbA1C: No results for input(s): HGBA1C in the last 8760 hours.     CBG (last 3)  Recent Labs    11/06/21 1414  GLUCAP 105*          Cultures:    Component Value Date/Time   SDES  10/18/2021 0926    URINE, CLEAN CATCH Performed at [facility], [place] [address], [place], [place] [address]    [place]  10/18/2021 7062    NONE Performed at [facility], [place] [address] [place],  [address]    CULT >=100,000 COLONIES/mL ESCHERICHIA COLI (A) 10/18/2021 0926   REPTSTATUS 10/20/2021 FINAL 10/18/2021 0926     Radiological Exams on Admission: DG Chest  2 View  Result Date: 11/06/2021 CLINICAL DATA:  Altered mental status. EXAM: CHEST - 2 VIEW COMPARISON:  10/18/2021 FINDINGS: Cardiac silhouette is normal in size and configuration. Small hiatal hernia. No mediastinal or hilar masses or adenopathy. Clear lungs.  No pleural effusion or pneumothorax. Previous right breast surgery. Skeletal structures are intact. IMPRESSION: No active cardiopulmonary disease. Electronically Signed   By: Lajean Manes M.D.   On: 11/06/2021 14:05   CT HEAD WO CONTRAST (5MM)  Result Date: 11/06/2021 CLINICAL DATA:  [place] EXAM: CT HEAD WITHOUT CONTRAST TECHNIQUE: Contiguous axial images were obtained from the base of the skull through the vertex without intravenous contrast. COMPARISON:  October 18, 2021 FINDINGS: Brain: No evidence of  acute infarction, hemorrhage, hydrocephalus, extra-axial collection or mass lesion/mass effect. There is chronic diffuse atrophy. Chronic bilateral periventricular white matter small vessel ischemic changes are identified. Vascular: No hyperdense vessel is identified. Skull: Normal. Negative for fracture or focal lesion. Sinuses/Orbits: No acute finding. Other: None. IMPRESSION: 1. No focal acute intracranial abnormality identified. 2. Chronic diffuse atrophy. Chronic bilateral periventricular white matter small vessel ischemic change. Electronically Signed   By: Abelardo Diesel M.D.   On: 11/06/2021 15:20   _______________________________________________________________________________________________________ Latest  Blood pressure (!) 169/98, pulse 93, temperature 98.6 F (37 C), temperature source Oral, resp. rate (!) 23, height 5\' 1"  (1.549 m), weight 77.2 kg, SpO2 97 %.   Vitals  labs and radiology finding personally reviewed  Review of Systems:    Pertinent positives include:  fatigue, confusion Constitutional:  No weight loss, night sweats, Fevers, chills,  weight loss  HEENT:  No headaches, Difficulty swallowing,Tooth/dental problems,Sore throat,  No sneezing, itching, ear ache, nasal congestion, post nasal drip,  Cardio-vascular:  No chest pain, Orthopnea, PND, anasarca, dizziness, palpitations.no Bilateral lower extremity swelling  GI:  No heartburn, indigestion, abdominal pain, nausea, vomiting, diarrhea, change in bowel habits, loss of appetite, melena, blood in stool, hematemesis Resp:  no shortness of breath at rest. No dyspnea on exertion, No excess mucus, no productive cough, No non-productive cough, No coughing up of blood.No change in color of mucus.No wheezing. Skin:  no rash or lesions. No jaundice GU:  no dysuria, change in color of urine, no urgency or frequency. No straining to urinate.  No flank pain.  Musculoskeletal:  No joint pain or no joint swelling. No decreased  range of motion. No back pain.  Psych:  No change in mood or affect. No depression or anxiety. No memory loss.  Neuro: no localizing neurological complaints, no tingling, no weakness, no double vision, no gait abnormality, no slurred speech, no   All systems reviewed and apart from [place] all are negative _______________________________________________________________________________________________ Past Medical History:   Past Medical History:  Diagnosis Date   Allergy    Anxiety    Breast cancer ([place])    Cataract    early signs of   Gait abnormality    Heart disease    Hemorrhoids    Hiatal hernia 11/07/2012   large   Hyperlipidemia    Hyperparathyroidism ([place])    Hypertension    Memory loss    Osteoporosis    Parathyroid disorder ([place])    Pulmonary nodules    TIA (transient ischemic attack)    Urinary incontinence       Past Surgical History:  Procedure Laterality Date   BLADDER SUSPENSION     CESAREAN SECTION     x 2   CHOLECYSTECTOMY     MASTECTOMY Right  PARATHYROIDECTOMY     TOTAL ELBOW REPLACEMENT     TUBAL LIGATION      Social History:  Ambulatory  walker   does not cooperate    reports that she has never smoked. She has never used smokeless tobacco. She reports that she does not drink alcohol and does not use drugs.     Family History:   Family History  Problem Relation Age of Onset   Breast cancer Mother    Sudden death Father    Heart failure Father    Hypertension Other    Heart disease Other    Colon cancer Neg Hx    Esophageal cancer Neg Hx    Stomach cancer Neg Hx    Rectal cancer Neg Hx    ______________________________________________________________________________________________ Allergies: Allergies  Allergen Reactions   Anesthetics, [place] Nausea And Vomiting   Codeine Nausea Only   Tape Other (See Comments)    Skin blisters   Amlodipine Other (See Comments) and Swelling   Cetirizine Hcl     Other reaction(s): altered  consciousness   Hydrochlorothiazide Other (See Comments)   Hydrocodone Nausea And Vomiting   Onion     Does not like   Latex Itching and Rash    Irritates skin     Prior to Admission medications   Medication Sig Start Date End Date Taking? Authorizing Provider  acetaminophen (TYLENOL) 650 MG CR tablet Take 1 tablet (650 mg total) by mouth every 8 (eight) hours as needed for pain. 10/21/21   Pokhrel, Corrie Mckusick, MD  Ascorbic Acid (VITAMIN C PO) Take 1 tablet by mouth daily.    [provider]  Cholecalciferol (VITAMIN D3) 2000 units TABS Take 2,000 Units by mouth daily.     [provider]  Coenzyme Q10 (CO Q 10 PO) Take 1 tablet by mouth daily.    [provider]  docusate sodium (COLACE) 100 MG capsule Take 100 mg by mouth daily.    [provider]  feeding supplement (ENSURE ENLIVE / ENSURE PLUS) LIQD Take 237 mLs by mouth 2 (two) times daily between meals. 10/21/21   Pokhrel, Corrie Mckusick, MD  hydrALAZINE (APRESOLINE) 25 MG tablet Take 1.5 tablets (37.5 mg total) by mouth 3 (three) times daily. 07/28/21   Hongalgi, Lenis Dickinson, MD  irbesartan (AVAPRO) 300 MG tablet Take 300 mg by mouth daily. 06/12/17   [provider]  lamoTRIgine (LAMICTAL) 100 MG tablet Take 1 tablet (100 mg total) by mouth 2 (two) times daily. 06/23/21   Marcial Pacas, MD  magnesium oxide (MAG-OX) 400 MG tablet Take 400 mg by mouth daily.    [provider]  Multiple Vitamin (MULTIVITAMIN WITH MINERALS) TABS tablet Take 1 tablet by mouth daily. 10/21/21 01/29/22  Pokhrel, Corrie Mckusick, MD  OVER THE COUNTER MEDICATION Take 10 mg by mouth at bedtime. Tumeric Black pepper 5 mg    [provider]  potassium chloride SA (KLOR-CON M) 20 MEQ tablet Take 1 tablet (20 mEq total) by mouth daily for 5 days. 10/21/21 10/26/21  Pokhrel, Corrie Mckusick, MD  Probiotic Product (PROBIOTIC PO) Take 1 capsule by mouth daily.    [provider]  rosuvastatin (CRESTOR) 10 MG tablet Take 10 mg by mouth  daily. 06/18/21   [provider]    ___________________________________________________________________________________________________ Physical Exam: Vitals with BMI 11/06/2021 11/06/2021 11/06/2021  Height - - -  Weight - - -  BMI - - -  Systolic 169 678 938  Diastolic 98 94 89  Pulse 93 88 -  1. General:  in No  Acute distress   Chronically ill   -appearing 2. Psychological: Alert and  Oriented to self  3. Head/ENT:    Dry Mucous Membranes                          Head Non traumatic, neck supple                         Poor Dentition 4. SKIN: decreased Skin turgor,  Skin clean Dry and intact no rash 5. Heart: Regular rate and rhythm systolic  Murmur, no Rub or gallop 6. Lungs:  Clear to auscultation bilaterally, no wheezes or crackles   7. Abdomen: Soft,  non-tender, Non distended   obese  bowel sounds present 8. Lower extremities: no clubbing, cyanosis, no  edema 9. Neurologically strength 5 out of 5 in all 4 extremities   10. MSK: Normal range of motion    Chart has been reviewed  ______________________________________________________________________________________________  Assessment/Plan  70 y.o. female with medical history significant of dementia hypertension, HLD, parathyroid disease with hypercalcemia    Admitted for  acute encephalopathy  Present on Admission:  Acute encephalopathy  Hypokalemia  Hyperlipidemia  Hypercalcemia  Dementia without behavioral disturbance (HCC)  Breast cancer (HCC)  Chronic diastolic CHF (congestive heart failure) ([place])  Hypertensive urgency  Anemia     Breast cancer ([place]) In remission.  Given repeated hypercalcemia as an outpatient may need to look into potential bony lesions.  Hyperlipidemia Chronic resume home medications Crestor  Hypokalemia - will replace and repeat in AM,  check magnesium level and replace as needed   Dementia without behavioral disturbance (HCC) Chronic continue Lamictal expect  some degree of sundowning.  Hypertensive urgency Patient recurrent presentations of slightly altered mental status in the setting of hypertension and blood pressure improves her mental status is improved.  Continue to monitor.  BP now in reasonable range.  Unclear why amlodipine was stopped with consider restarting.  Continue Avapro and hydralazine labetalol as needed  Hypercalcemia Recurrent episode in the past improved with IV rehydration.  Check PTH and PTH related peptide. Rehydrate and follow fluid status. Would benefit from outpatient follow-up and work-up  Acute encephalopathy   - most likely multifactorial secondary to combination of elevated blood pressure resulting in hypertensive urgency, mild dehydration secondary to decreased by mouth intake, mild hypercalcemia  - Will rehydrate  Control blood pressure  - Hold contributing medications   - At this point patient appears to be improving with no focal neurological deficits.  - neurological exam appears to be nonfocal but patient unable to cooperate fully   - VBG ordered      Chronic diastolic CHF (congestive heart failure) ([place]) - currently appears to be slightly on the dry side, hold home diuretics for tonight and restart when appears euvolemic, carefuly follow fluid status and Cr   Anemia Mild no complaints of bleeding.  Obtain anemia panel    Other plan as per orders.  DVT prophylaxis:  SCD      Code Status: DNR/DNI as per family  I had personally discussed CODE STATUS with patient and family     Family Communication:   Family  at  Bedside  plan of care was discussed  with   Husband   Disposition Plan:     likely will need placement for rehabilitation  Following barriers for discharge:                            Electrolytes corrected                               Anemia stable Bp controlled                       Would benefit from PT/OT eval prior to DC  Ordered                       Consults called: none  Admission status:  ED Disposition     ED Disposition  Admit   Condition  --   [place]: [place] [[id number]]  Level of Care: Progressive [102]  Admit to Progressive based on following criteria: MULTISYSTEM THREATS such as stable sepsis, metabolic/electrolyte imbalance with or without encephalopathy that is responding to early treatment.  May place patient in observation at [facility] or [place] if equivalent level of care is available:: No  Covid Evaluation: Asymptomatic Screening Protocol (No Symptoms)  Diagnosis: Acute encephalopathy [[id number]]  Admitting Physician: Toy Baker [[id number]]  Attending Physician: Toy Baker [[id number]]           Obs    Level of care     tele  For 12H      Lab Results  Component Value Date   [place] 11/06/2021     Precautions: admitted as  Covid Negative     Falisa Lamora 11/06/2021, 10:19 PM    [facility]     after 2 AM please page floor coverage PA If 7AM-7PM, please contact the day team taking care of the patient using [email or web address]   Patient was evaluated in the context of the global COVID-19 pandemic, which necessitated consideration that the patient might be at risk for infection with the SARS-CoV-2 virus that causes COVID-19. Institutional protocols and algorithms that pertain to the evaluation of patients at risk for COVID-19 are in a state of rapid change based on information released by regulatory bodies including the CDC and federal and state organizations. These policies and algorithms were followed during the patient's care.

## 2021-11-06 NOTE — Assessment & Plan Note (Signed)
Chronic continue Lamictal expect some degree of sundowning.

## 2021-11-06 NOTE — Assessment & Plan Note (Signed)
-   currently appears to be slightly on the dry side, hold home diuretics for tonight and restart when appears euvolemic, carefuly follow fluid status and Cr  

## 2021-11-06 NOTE — Assessment & Plan Note (Signed)
-   will replace and repeat in AM,  check magnesium level and replace as needed ° °

## 2021-11-06 NOTE — Subjective & Objective (Signed)
Comes from home recently was discharged from [facility] health care where she was taken off of amlodipine. Noted to be tachycardic on EMS arrival At baseline has dementia Has been stated in the past she has had episodes of confusion weakness when her blood pressure was high.  When her home health nurse checked her blood pressure today was up to 190.  Patient also did not describe any headache or shortness of breath or chest pain.

## 2021-11-06 NOTE — Assessment & Plan Note (Signed)
-   most likely multifactorial secondary to combination of elevated blood pressure resulting in hypertensive urgency, mild dehydration secondary to decreased by mouth intake, mild hypercalcemia  - Will rehydrate  Control blood pressure  - Hold contributing medications   - At this point patient appears to be improving with no focal neurological deficits.  - neurological exam appears to be nonfocal but patient unable to cooperate fully   - VBG ordered

## 2021-11-06 NOTE — Assessment & Plan Note (Signed)
Recurrent episode in the past improved with IV rehydration.  Check PTH and PTH related peptide. Rehydrate and follow fluid status. Would benefit from outpatient follow-up and work-up

## 2021-11-06 NOTE — ED Provider Notes (Signed)
[facility] DEPT Provider Note   CSN: [id number] Arrival date & time: 11/06/21  1257     History Chief Complaint  Patient presents with   Altered Mental Status    Sarah ABOOD is a 70 y.o. female.  HPI She presents for evaluation of confusion that started today.  She was released from rehab yesterday.  Her husband is a historian.  She is unable to give any history.  Her husband reports that she has been confused like this previously when her blood pressure was high.  Today home health nurse checked her blood pressure and it was elevated at "190."  She was transferred by EMS who apparently also found her blood pressure high.  She is taking her usual prescribed medicines.  She has not complained of headache or trouble breathing today.  Her husband reports that she has had a gradual decline of her memory since a head injury, 2018.  She was hospitalized and discharged, 10/21/2021, whereupon she was placed in rehab.  Level 5 caveat-confusion    Past Medical History:  Diagnosis Date   Allergy    Anxiety    Breast cancer ([place])    Cataract    early signs of   Gait abnormality    Heart disease    Hemorrhoids    Hiatal hernia 11/07/2012   large   Hyperlipidemia    Hyperparathyroidism ([place])    Hypertension    Memory loss    Osteoporosis    Parathyroid disorder ([place])    Pulmonary nodules    TIA (transient ischemic attack)    Urinary incontinence     Patient Active Problem List   Diagnosis Date Noted   E-coli UTI 10/19/2021   Hypercalcemia 10/18/2021   Dementia without behavioral disturbance ([place]) 07/22/2021   Hypertensive urgency 07/21/2021   Hypokalemia 07/20/2021   Generalized muscle weakness 07/20/2021   Encephalomalacia 07/20/2021   Urinary incontinence 07/20/2021   Hypophosphatemia 07/20/2021   Memory loss 06/23/2021   Confusion 06/23/2021   Encephalopathy acute 10/18/2017   Post-concussion headache 10/18/2017   Altered mental  status 97/35/3299   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[place]) 03/09/2016   Chest pain 14-Feb-2016   History of antineoplastic chemotherapy with cardiotoxic drugs 02-14-16   Family history of sudden cardiac death in father 02-14-16   Breast cancer [facility])    Hemorrhoids    Hiatal hernia    Pulmonary nodules    Hyperparathyroidism ([place])    Hypertension    Hyperlipidemia    Heart disease    Allergy    Anxiety    TIA (transient ischemic attack)    Cataract    Osteoporosis    Parathyroid disorder ([place])    Irritable bowel syndrome 12/11/2013   Headache(784.0) 12/10/2013    Past Surgical History:  Procedure Laterality Date   BLADDER SUSPENSION     CESAREAN SECTION     x 2   CHOLECYSTECTOMY     MASTECTOMY Right    PARATHYROIDECTOMY     TOTAL ELBOW REPLACEMENT     TUBAL LIGATION       OB History   No obstetric history on file.     Family History  Problem Relation Age of Onset   Breast cancer Mother    Sudden death Father    Heart failure Father    Hypertension Other    Heart disease Other    Colon cancer Neg Hx    Esophageal cancer Neg Hx    Stomach cancer  Neg Hx    Rectal cancer Neg Hx     Social History   Tobacco Use   Smoking status: Never   Smokeless tobacco: Never  Vaping Use   Vaping Use: Never used  Substance Use Topics   Alcohol use: No   Drug use: No    Home Medications Prior to Admission medications   Medication Sig Start Date End Date Taking? Authorizing Provider  acetaminophen (TYLENOL) 650 MG CR tablet Take 1 tablet (650 mg total) by mouth every 8 (eight) hours as needed for pain. 10/21/21   Pokhrel, Corrie Mckusick, MD  Ascorbic Acid (VITAMIN C PO) Take 1 tablet by mouth daily.    [provider]  Cholecalciferol (VITAMIN D3) 2000 units TABS Take 2,000 Units by mouth daily.     [provider]  Coenzyme Q10 (CO Q 10 PO) Take 1 tablet by mouth daily.    [provider]  docusate sodium (COLACE) 100 MG capsule  Take 100 mg by mouth daily.    [provider]  feeding supplement (ENSURE ENLIVE / ENSURE PLUS) LIQD Take 237 mLs by mouth 2 (two) times daily between meals. 10/21/21   Pokhrel, Corrie Mckusick, MD  hydrALAZINE (APRESOLINE) 25 MG tablet Take 1.5 tablets (37.5 mg total) by mouth 3 (three) times daily. 07/28/21   Hongalgi, Lenis Dickinson, MD  irbesartan (AVAPRO) 300 MG tablet Take 300 mg by mouth daily. 06/12/17   [provider]  lamoTRIgine (LAMICTAL) 100 MG tablet Take 1 tablet (100 mg total) by mouth 2 (two) times daily. 06/23/21   Marcial Pacas, MD  magnesium oxide (MAG-OX) 400 MG tablet Take 400 mg by mouth daily.    [provider]  Multiple Vitamin (MULTIVITAMIN WITH MINERALS) TABS tablet Take 1 tablet by mouth daily. 10/21/21 01/29/22  Pokhrel, Corrie Mckusick, MD  OVER THE COUNTER MEDICATION Take 10 mg by mouth at bedtime. Tumeric Black pepper 5 mg    [provider]  potassium chloride SA (KLOR-CON M) 20 MEQ tablet Take 1 tablet (20 mEq total) by mouth daily for 5 days. 10/21/21 10/26/21  Pokhrel, Corrie Mckusick, MD  Probiotic Product (PROBIOTIC PO) Take 1 capsule by mouth daily.    [provider]  rosuvastatin (CRESTOR) 10 MG tablet Take 10 mg by mouth daily. 06/18/21   [provider]    Allergies    Anesthetics, ester; Codeine; Tape; Amlodipine; Cetirizine hcl; Hydrochlorothiazide; Hydrocodone; Onion; and Latex  Review of Systems   Review of Systems  All other systems reviewed and are negative.  Physical Exam Updated Vital Signs BP (!) 169/98 (BP Location: Right Arm)    Pulse 93    Temp 98.6 F (37 C) (Oral)    Resp (!) 23    Ht 5\' 1"  (1.549 m)    Wt 77.2 kg    SpO2 97%    BMI 32.16 kg/m   Physical Exam Vitals and nursing note reviewed.  Constitutional:      General: She is not in acute distress.    Appearance: She is well-developed. She is not ill-appearing, toxic-appearing or diaphoretic.  HENT:     Head: Normocephalic and atraumatic.     Right Ear:  External ear normal.     Left Ear: External ear normal.     Mouth/Throat:     Mouth: Mucous membranes are moist.     Pharynx: No oropharyngeal exudate or posterior oropharyngeal erythema.  Eyes:     Conjunctiva/sclera: Conjunctivae normal.     Pupils: Pupils are equal, round, and  reactive to light.  Neck:     Trachea: Phonation normal.  Cardiovascular:     Rate and Rhythm: Normal rate and regular rhythm.     Heart sounds: Normal heart sounds.  Pulmonary:     Effort: Pulmonary effort is normal.     Breath sounds: Normal breath sounds.  Abdominal:     General: There is no distension.     Palpations: Abdomen is soft.     Tenderness: There is no abdominal tenderness.  Musculoskeletal:        General: Normal range of motion.     Cervical back: Normal range of motion and neck supple.  Skin:    General: Skin is warm and dry.  Neurological:     Mental Status: She is alert.     Cranial Nerves: No cranial nerve deficit.     Sensory: No sensory deficit.     Motor: No abnormal muscle tone.     Coordination: Coordination normal.     Comments: Oriented only to person.  No dysarthria or aphasia.  When asked questions she looks at her husband for answers.  Psychiatric:        Mood and Affect: Mood normal.        Behavior: Behavior normal.    ED Results / Procedures / Treatments   Labs (all labs ordered are listed, but only abnormal results are displayed) Labs Reviewed  COMPREHENSIVE METABOLIC PANEL - Abnormal; Notable for the following components:      Result Value   Potassium 3.2 (*)    Glucose, Bld 106 (*)    Calcium 11.1 (*)    Total Bilirubin 1.8 (*)    All other components within normal limits  CBC WITH DIFFERENTIAL/PLATELET - Abnormal; Notable for the following components:   RBC 3.59 (*)    Hemoglobin 11.7 (*)    HCT 34.2 (*)    All other components within normal limits  URINALYSIS, ROUTINE W REFLEX MICROSCOPIC - Abnormal; Notable for the following components:   APPearance  HAZY (*)    Ketones, ur 5 (*)    Leukocytes,Ua SMALL (*)    Bacteria, UA FEW (*)    All other components within normal limits  CBG MONITORING, ED - Abnormal; Notable for the following components:   Glucose-Capillary 105 (*)    All other components within normal limits  RESP PANEL BY RT-PCR (FLU A&B, COVID) ARPGX2  AMMONIA  RAPID URINE DRUG SCREEN, HOSP PERFORMED  ETHANOL  PROTIME-INR  CALCIUM, IONIZED    EKG EKG Interpretation  Date/Time:  Saturday November 06 2021 13:48:25 EST Ventricular Rate:  110 PR Interval:  180 QRS Duration: 84 QT Interval:  338 QTC Calculation: 458 R Axis:   45 Text Interpretation: Sinus tachycardia LAE, consider biatrial enlargement Since last tracing rate faster Otherwise no significant change Confirmed by Daleen Bo [phone]) on 11/06/2021 2:53:46 PM  Radiology DG Chest 2 View  Result Date: 11/06/2021 CLINICAL DATA:  Altered mental status. EXAM: CHEST - 2 VIEW COMPARISON:  10/18/2021 FINDINGS: Cardiac silhouette is normal in size and configuration. Small hiatal hernia. No mediastinal or hilar masses or adenopathy. Clear lungs.  No pleural effusion or pneumothorax. Previous right breast surgery. Skeletal structures are intact. IMPRESSION: No active cardiopulmonary disease. Electronically Signed   By: Lajean Manes M.D.   On: 11/06/2021 14:05   CT HEAD WO CONTRAST (5MM)  Result Date: 11/06/2021 CLINICAL DATA:  [place] EXAM: CT HEAD WITHOUT CONTRAST TECHNIQUE: Contiguous axial images were obtained from the base of  the skull through the vertex without intravenous contrast. COMPARISON:  October 18, 2021 FINDINGS: Brain: No evidence of acute infarction, hemorrhage, hydrocephalus, extra-axial collection or mass lesion/mass effect. There is chronic diffuse atrophy. Chronic bilateral periventricular white matter small vessel ischemic changes are identified. Vascular: No hyperdense vessel is identified. Skull: Normal. Negative for fracture or focal lesion.  Sinuses/Orbits: No acute finding. Other: None. IMPRESSION: 1. No focal acute intracranial abnormality identified. 2. Chronic diffuse atrophy. Chronic bilateral periventricular white matter small vessel ischemic change. Electronically Signed   By: Abelardo Diesel M.D.   On: 11/06/2021 15:20    Procedures .Critical Care Performed by: Daleen Bo, MD Authorized by: Daleen Bo, MD   Critical care provider statement:    Critical care time (minutes):  40   Critical care start time:  11/06/2021 2:05 PM   Critical care end time:  11/06/2021 9:11 PM   Critical care time was exclusive of:  Separately billable procedures and treating other patients   Critical care was time spent personally by me on the following activities:  Blood draw for specimens, development of treatment plan with patient or surrogate, discussions with consultants, evaluation of patient's response to treatment, examination of patient, ordering and performing treatments and interventions, ordering and review of laboratory studies, ordering and review of radiographic studies, pulse oximetry, re-evaluation of patient's condition and review of old charts   Medications Ordered in ED Medications  labetalol (NORMODYNE) injection 10 mg (10 mg Intravenous Given 11/06/21 2007)  potassium chloride SA (KLOR-CON M) CR tablet 40 mEq (40 mEq Oral Given 11/06/21 1636)  potassium chloride 10 mEq in 100 mL IVPB (0 mEq Intravenous Stopped 11/06/21 1927)  hydrALAZINE (APRESOLINE) tablet 50 mg (50 mg Oral Given 11/06/21 1926)    ED Course  I have reviewed the triage vital signs and the nursing notes.  Pertinent labs & imaging results that were available during my care of the patient were reviewed by me and considered in my medical decision making (see chart for details).  Clinical Course as of 11/06/21 2034  Sat Nov 06, 2021  2028 MCV: 95.3 [EW]    Clinical Course User Index [EW] Daleen Bo, MD   MDM Rules/Calculators/A&P                           Patient Vitals for the past 24 hrs:  BP Temp Temp src Pulse Resp SpO2 Height Weight  11/06/21 2001 (!) 169/98 -- -- 93 (!) 23 97 % -- --  11/06/21 1930 (!) 178/94 -- -- 88 17 97 % -- --  11/06/21 1926 (!) 174/89 -- -- -- -- -- -- --  11/06/21 1830 (!) 154/85 -- -- 87 (!) 24 95 % -- --  11/06/21 1820 (!) 156/82 -- -- 87 20 94 % -- --  11/06/21 1700 (!) 187/95 -- -- (!) 103 13 96 % -- --  11/06/21 1630 (!) 173/83 -- -- (!) 102 (!) 23 96 % -- --  11/06/21 1600 (!) 183/84 -- -- (!) 103 18 96 % -- --  11/06/21 1500 (!) 178/115 -- -- 100 20 95 % -- --  11/06/21 1430 (!) 193/96 -- -- (!) 103 17 97 % -- --  11/06/21 1415 -- -- -- (!) 103 20 97 % -- --  11/06/21 1402 (!) 195/115 -- -- (!) 106 (!) 24 97 % -- --  11/06/21 1400 -- -- -- (!) 111 (!) 22 98 % -- --  11/06/21 1307 -- -- -- -- -- --  5\' 1"  (1.549 m) 77.2 kg  11/06/21 1305 (!) 183/93 98.6 F (37 C) Oral (!) 109 16 95 % -- --    8:28 PM Reevaluation with update and discussion with patient and her husband at the bedside. After initial assessment and treatment, an updated evaluation reveals persistent confusion, blood pressure mildly elevated.  Husband is concerned that he cannot manage her in this condition at home. Illness risk, progression, worsening symptoms due to multiple metabolic abnormalities and hypertension, discussed. Daleen Bo    Medical Decision Making: Summary: Confusion with altered mental status, in an elderly patient who has a prior diagnosis of dementia without behavioral disturbance.  She was recently hospitalized with hypertensive urgency, hypercalcemia and hypokalemia.  Patient's husband reports that she was doing well after she was released from rehab yesterday but has been confused all day today.  Critical Interventions-laboratory testing, radiography, CT imaging; to evaluate  Chief Complaint  Patient presents with   Altered Mental Status    and assess for illness characterized as Acute, Uncertain  Prognosis, Complicated, and Threat to Life/Bodily Function   After These Interventions, the Patient was reevaluated and was found with likely multifocal confusion, with secondary hypercalcemia and hypokalemia.  Patient discharged from rehab yesterday after hospitalization preceding that.  During hospitalization she had multiple metabolic abnormalities improved with medications, fluids and diet.  She has ongoing dementia.  Today presented with hypertension despite taking usual medicines.  Doubt hypertensive urgency, CVA, hemodynamic instability.  This patient is Presenting for Evaluation of confusion which is apparently recurrent, which does require a range of treatment options, and is a complaint that involves a high risk of morbidity and mortality. The Differential Diagnoses include hypertensive urgency, hypercalcemia, dementia, traumatic brain injury. I decided to review pertinent External Data, and in summary her recent hospitalization was complicated with hypercalcemia, hypokalemia, hypertensive urgency.  She was also treated for E. coli UTI.  She has underlying history of dementia and traumatic brain injury. I obtained additional Historical Information from husband at the bedside, as the patient is confused.  Clinical Laboratory Tests Ordered, included CBC, Metabolic panel, and Urinalysis.  Alcohol level, INR, UDS.  Review indicates normal except hemoglobin low, potassium low, glucose high, calcium high. Emergent testing abnormality management required for stabilization-hypokalemia requiring oral and intravenous potassium Radiologic Tests Ordered, included CT head, chest x-ray.  I independently Visualized: Radiograph images, which show no acute abnormalities  Cardiac Monitor Tracing which shows normal sinus rhythm, indicating stable cardiac rhythm    Pharmaceutical Risk Management treatment of hypokalemia and hyperglycemia .  Prescription Management  Treatment Complication Risk Requirement  stable metabolic status and worsening confusion Hospitalization  8:28 PM-Consult complete with hospitalist. Patient case explained and discussed. Requirement for hospitalization agreed on. Possible Risk of worsening confusion, metabolic disorder.  She agrees to admit patient for further evaluation and treatment. Call ended at 9:10 PM  CRITICAL CARE-yes Performed by: Daleen Bo  Nursing Notes Reviewed/ Care Coordinated Applicable Imaging Reviewed Interpretation of Laboratory Data incorporated into ED treatment         Final Clinical Impression(s) / ED Diagnoses Final diagnoses:  Hypokalemia  Hypercalcemia  Hypertension, unspecified type  Confusion  Dementia, unspecified dementia severity, unspecified dementia type, unspecified whether behavioral, psychotic, or mood disturbance or anxiety [facility])    Rx / DC Orders ED Discharge Orders     None        Daleen Bo, MD 11/06/21 2111

## 2021-11-06 NOTE — Assessment & Plan Note (Signed)
Mild no complaints of bleeding.  Obtain anemia panel

## 2021-11-06 NOTE — Assessment & Plan Note (Signed)
Chronic resume home medications Crestor

## 2021-11-06 NOTE — Assessment & Plan Note (Signed)
In remission.  Given repeated hypercalcemia as an outpatient may need to look into potential bony lesions.

## 2021-11-06 NOTE — ED Triage Notes (Signed)
BIB EMS from home, husband called out for confusion. Pt was recently released from [facility] was taken off her amlodipine while she was there. EMS reports pt is alert to person and place, but not time. Appears pale and warm to touch w/ EMS.   Per EMS BP 160s  RR 20 HR 112 T 100 temporal CBG 123 w/ EMS

## 2021-11-07 ENCOUNTER — Observation Stay (HOSPITAL_COMMUNITY): Payer: Medicare PPO

## 2021-11-07 ENCOUNTER — Observation Stay (HOSPITAL_BASED_OUTPATIENT_CLINIC_OR_DEPARTMENT_OTHER): Payer: Medicare PPO

## 2021-11-07 ENCOUNTER — Encounter (HOSPITAL_COMMUNITY): Payer: Self-pay | Admitting: Internal Medicine

## 2021-11-07 DIAGNOSIS — E876 Hypokalemia: Secondary | ICD-10-CM | POA: Diagnosis not present

## 2021-11-07 DIAGNOSIS — F0390 Unspecified dementia without behavioral disturbance: Secondary | ICD-10-CM | POA: Diagnosis not present

## 2021-11-07 DIAGNOSIS — J9811 Atelectasis: Secondary | ICD-10-CM | POA: Diagnosis not present

## 2021-11-07 DIAGNOSIS — E7800 Pure hypercholesterolemia, unspecified: Secondary | ICD-10-CM | POA: Diagnosis not present

## 2021-11-07 DIAGNOSIS — I2699 Other pulmonary embolism without acute cor pulmonale: Secondary | ICD-10-CM | POA: Diagnosis not present

## 2021-11-07 DIAGNOSIS — I160 Hypertensive urgency: Secondary | ICD-10-CM | POA: Diagnosis not present

## 2021-11-07 DIAGNOSIS — R5381 Other malaise: Secondary | ICD-10-CM

## 2021-11-07 DIAGNOSIS — E860 Dehydration: Secondary | ICD-10-CM

## 2021-11-07 DIAGNOSIS — I5032 Chronic diastolic (congestive) heart failure: Secondary | ICD-10-CM | POA: Diagnosis not present

## 2021-11-07 DIAGNOSIS — G9340 Encephalopathy, unspecified: Secondary | ICD-10-CM | POA: Diagnosis not present

## 2021-11-07 LAB — COMPREHENSIVE METABOLIC PANEL
ALT: 11 U/L (ref 0–44)
AST: 14 U/L — ABNORMAL LOW (ref 15–41)
Albumin: 3.6 g/dL (ref 3.5–5.0)
Alkaline Phosphatase: 42 U/L (ref 38–126)
Anion gap: 6 (ref 5–15)
BUN: 14 mg/dL (ref 8–23)
CO2: 25 mmol/L (ref 22–32)
Calcium: 9.9 mg/dL (ref 8.9–10.3)
Chloride: 108 mmol/L (ref 98–111)
Creatinine, Ser: 0.95 mg/dL (ref 0.44–1.00)
GFR, Estimated: >60 mL/min (ref >60)
Glucose, Bld: 97 mg/dL (ref 70–99)
Potassium: 3.4 mmol/L — ABNORMAL LOW (ref 3.5–5.1)
Sodium: 139 mmol/L (ref 135–145)
Total Bilirubin: 1.6 mg/dL — ABNORMAL HIGH (ref 0.3–1.2)
Total Protein: 5.9 g/dL — ABNORMAL LOW (ref 6.5–8.1)

## 2021-11-07 LAB — CBC WITH DIFFERENTIAL/PLATELET
Abs Immature Granulocytes: 0.03 10*3/uL (ref 0.00–0.07)
Basophils Absolute: 0.1 10*3/uL (ref 0.0–0.1)
Basophils Relative: 1 %
Eosinophils Absolute: 0.1 10*3/uL (ref 0.0–0.5)
Eosinophils Relative: 1 %
HCT: 28.1 % — ABNORMAL LOW (ref 36.0–46.0)
Hemoglobin: 9.4 g/dL — ABNORMAL LOW (ref 12.0–15.0)
Immature Granulocytes: 0 %
Lymphocytes Relative: 18 %
Lymphs Abs: 1.3 10*3/uL (ref 0.7–4.0)
MCH: 32.3 pg (ref 26.0–34.0)
MCHC: 33.5 g/dL (ref 30.0–36.0)
MCV: 96.6 fL (ref 80.0–100.0)
Monocytes Absolute: 0.5 10*3/uL (ref 0.1–1.0)
Monocytes Relative: 7 %
Neutro Abs: 5.1 10*3/uL (ref 1.7–7.7)
Neutrophils Relative %: 73 %
Platelets: 268 10*3/uL (ref 150–400)
RBC: 2.91 MIL/uL — ABNORMAL LOW (ref 3.87–5.11)
RDW: 14.1 % (ref 11.5–15.5)
WBC: 7.1 10*3/uL (ref 4.0–10.5)
nRBC: 0 % (ref 0.0–0.2)

## 2021-11-07 LAB — RETICULOCYTES
Immature Retic Fract: 12 % (ref 2.3–15.9)
RBC.: 2.88 MIL/uL — ABNORMAL LOW (ref 3.87–5.11)
Retic Count, Absolute: 123.6 10*3/uL (ref 19.0–186.0)
Retic Ct Pct: 4.3 % — ABNORMAL HIGH (ref 0.4–3.1)

## 2021-11-07 LAB — OSMOLALITY, URINE: Osmolality, Ur: 465 mOsm/kg (ref 300–900)

## 2021-11-07 LAB — HEPATIC FUNCTION PANEL
ALT: 11 U/L (ref 0–44)
AST: 16 U/L (ref 15–41)
Albumin: 4 g/dL (ref 3.5–5.0)
Alkaline Phosphatase: 46 U/L (ref 38–126)
Bilirubin, Direct: 0.2 mg/dL (ref 0.0–0.2)
Indirect Bilirubin: 1.5 mg/dL — ABNORMAL HIGH (ref 0.3–0.9)
Total Bilirubin: 1.7 mg/dL — ABNORMAL HIGH (ref 0.3–1.2)
Total Protein: 6.6 g/dL (ref 6.5–8.1)

## 2021-11-07 LAB — PHOSPHORUS
Phosphorus: 2.6 mg/dL (ref 2.5–4.6)
Phosphorus: 2.5 mg/dL (ref 2.5–4.6)

## 2021-11-07 LAB — OSMOLALITY: Osmolality: 293 mOsm/kg (ref 275–295)

## 2021-11-07 LAB — IRON AND TIBC
Iron: 96 ug/dL (ref 28–170)
Saturation Ratios: 40 % — ABNORMAL HIGH (ref 10.4–31.8)
TIBC: 242 ug/dL — ABNORMAL LOW (ref 250–450)
UIBC: 146 ug/dL

## 2021-11-07 LAB — VITAMIN B12: Vitamin B-12: 366 pg/mL (ref 180–914)

## 2021-11-07 LAB — FOLATE: Folate: 10.7 ng/mL (ref >5.9)

## 2021-11-07 LAB — MAGNESIUM
Magnesium: 2.4 mg/dL (ref 1.7–2.4)
Magnesium: 2 mg/dL (ref 1.7–2.4)

## 2021-11-07 LAB — TROPONIN I (HIGH SENSITIVITY)
Troponin I (High Sensitivity): 13 ng/L (ref <18)
Troponin I (High Sensitivity): 11 ng/L (ref <18)

## 2021-11-07 LAB — CK: Total CK: 24 U/L — ABNORMAL LOW (ref 38–234)

## 2021-11-07 LAB — TSH
TSH: 2.012 u[IU]/mL (ref 0.350–4.500)
TSH: 1.975 u[IU]/mL (ref 0.350–4.500)

## 2021-11-07 LAB — FERRITIN: Ferritin: 97 ng/mL (ref 11–307)

## 2021-11-07 LAB — D-DIMER, QUANTITATIVE: D-Dimer, Quant: 1.18 ug/mL-FEU — ABNORMAL HIGH (ref 0.00–0.50)

## 2021-11-07 MED ORDER — POTASSIUM CHLORIDE CRYS ER 20 MEQ PO TBCR: 20.0000 meq | EXTENDED_RELEASE_TABLET | Freq: Every day | ORAL | 0 refills | Status: DC

## 2021-11-07 MED ORDER — APIXABAN (ELIQUIS) VTE STARTER PACK (10MG AND 5MG): ORAL_TABLET | ORAL | 0 refills | Status: DC

## 2021-11-07 MED ORDER — POTASSIUM CHLORIDE CRYS ER 20 MEQ PO TBCR
40.0000 meq | EXTENDED_RELEASE_TABLET | ORAL | Status: AC
  Administered 2021-11-07 (×2): 40 meq via ORAL
  Filled 2021-11-07 (×2): qty 2

## 2021-11-07 MED ORDER — IOHEXOL 350 MG/ML SOLN
75.0000 mL | Freq: Once | INTRAVENOUS | Status: AC | PRN
  Administered 2021-11-07: 75 mL via INTRAVENOUS

## 2021-11-07 MED ORDER — HEPARIN BOLUS VIA INFUSION
2000.0000 [IU] | Freq: Once | INTRAVENOUS | Status: AC
  Administered 2021-11-07: 2000 [IU] via INTRAVENOUS
  Filled 2021-11-07: qty 2000

## 2021-11-07 MED ORDER — APIXABAN 5 MG PO TABS: 5.0000 mg | ORAL_TABLET | Freq: Two times a day (BID) | ORAL | Status: DC

## 2021-11-07 MED ORDER — HEPARIN (PORCINE) 25000 UT/250ML-% IV SOLN
1050.0000 [IU]/h | INTRAVENOUS | Status: AC
  Filled 2021-11-07: qty 250

## 2021-11-07 MED ORDER — APIXABAN 5 MG PO TABS: 5.0000 mg | ORAL_TABLET | Freq: Two times a day (BID) | ORAL | 1 refills | Status: DC

## 2021-11-07 MED ORDER — HYDRALAZINE HCL 50 MG PO TABS: 50.0000 mg | ORAL_TABLET | Freq: Three times a day (TID) | ORAL | 3 refills | Status: DC

## 2021-11-07 MED ORDER — HEPARIN (PORCINE) 25000 UT/250ML-% IV SOLN
1050.0000 [IU]/h | INTRAVENOUS | Status: DC
  Administered 2021-11-07: 1050 [IU]/h via INTRAVENOUS
  Filled 2021-11-07: qty 250

## 2021-11-07 MED ORDER — APIXABAN 5 MG PO TABS
10.0000 mg | ORAL_TABLET | Freq: Two times a day (BID) | ORAL | Status: DC
  Administered 2021-11-07: 10 mg via ORAL
  Filled 2021-11-07: qty 2

## 2021-11-07 NOTE — Progress Notes (Signed)
ANTICOAGULATION CONSULT NOTE - Initial Consult  Pharmacy Consult for heparin Indication: pulmonary embolus  Allergies  Allergen Reactions   Anesthetics, [place] Nausea And Vomiting   Codeine Nausea Only   Tape Other (See Comments)    Skin blisters   Amlodipine Other (See Comments) and Swelling   Cetirizine Hcl     Other reaction(s): altered consciousness   Hydrochlorothiazide Other (See Comments)   Hydrocodone Nausea And Vomiting   Onion     Does not like   Latex Itching and Rash    Irritates skin    Patient Measurements: Height: 5\' 1"  (154.9 cm) Weight: 77.2 kg (170 lb 3.1 oz) IBW/kg (Calculated) : 47.8 Heparin Dosing Weight: 65kg  Vital Signs: BP: 137/77 (01/01 0200) Pulse Rate: 89 (01/01 0200)  Labs: Recent Labs    11/06/21 1335 11/06/21 2218  HGB 11.7*  --   HCT 34.2*  --   PLT 369  --   LABPROT 12.7  --   INR 1.0  --   CREATININE 0.99  --   CKTOTAL  --  24*  TROPONINIHS  --  13    Estimated Creatinine Clearance: 49 mL/min (by C-G formula based on SCr of 0.99 mg/dL).   Medical History: Past Medical History:  Diagnosis Date   Allergy    Anxiety    Breast cancer ([place])    Cataract    early signs of   Gait abnormality    Heart disease    Hemorrhoids    Hiatal hernia 11/07/2012   large   Hyperlipidemia    Hyperparathyroidism ([place])    Hypertension    Memory loss    Osteoporosis    Parathyroid disorder ([place])    Pulmonary nodules    TIA (transient ischemic attack)    Urinary incontinence     Assessment: 71 yo female with medical hx significant of dementia, hypertension, HLD, parathyroid disease with hypercalcemia, admitted for acute encephalopathy. Radiology found small PE in rt lower lobe.  Pharmacy to dose heparin.  No prior AC noted  Baseline labs WNL, Hgb 11.7, plts 369, Scr 0.99  Goal of Therapy:  Heparin level 0.3-0.7 units/ml Monitor platelets by anticoagulation protocol: Yes   Plan:  Using [place] give heparin 2000 bolus x 1  then Start heparin drip at 1050 units/hr Heparin level in 8 hours Daily CBC   Dolly Rias RPh 11/07/2021, 3:26 AM

## 2021-11-07 NOTE — Evaluation (Signed)
Physical Therapy Evaluation Patient Details Name: Sarah Cook MRN: [id number] DOB: 1951-06-01 Today's Date: 11/07/2021  History of Present Illness  Sarah Cook is a 71 y.o. female with medical history significant of dementia hypertension, HLD, parathyroid disease with hypercalcemia. Admitted for acute encephalopathy  Clinical Impression  Sarah Cook is a 71 year old woman who presents with cognitive impairments secondary to dementia as well as generalized weakness and decreased activity tolerance. Patient requiring max assist to transfer to side of bed and min assist to stand and ambulate.  Patient exhibits the functional abilities to return home with 24/7 assistance and [facility] services     Recommendations for follow up therapy are one component of a multi-disciplinary discharge planning process, led by the attending physician.  Recommendations may be updated based on patient status, additional functional criteria and insurance authorization.  Follow Up Recommendations Home health PT    Assistance Recommended at Discharge Frequent or constant Supervision/Assistance  Functional Status Assessment Patient has had a recent decline in their functional status and demonstrates the ability to make significant improvements in function in a reasonable and predictable amount of time.  Equipment Recommendations  None recommended by PT    Recommendations for Other Services       Precautions / Restrictions Precautions Precautions: Fall Precaution Comments: incontinent Restrictions Weight Bearing Restrictions: No      Mobility  Bed Mobility Overal bed mobility: Needs Assistance Bed Mobility: Supine to Sit     Supine to sit: Max assist     General bed mobility comments: Max assist to transfer to side of bed - mostly for multimodal cues for motor planning and initiating movement for transfer.    Transfers Overall transfer level: Needs assistance Equipment used: Rolling walker (2  wheels) Transfers: Sit to/from Stand Sit to Stand: Min assist;+2 safety/equipment           General transfer comment: in assist to stand from stretcher and balance in standing with RW    Ambulation/Gait Ambulation/Gait assistance: Min assist Gait Distance (Feet): 35 Feet (35' twice to/from bathroom) Assistive device: Rolling walker (2 wheels) Gait Pattern/deviations: Step-to pattern;Step-through pattern;Decreased step length - right;Decreased step length - left;Shuffle;Trunk flexed Gait velocity: decr     General Gait Details: cues for posture, position from RW and safety awareness; min assist for balance and RW management  Stairs            Wheelchair Mobility    Modified Rankin (Stroke Patients Only)       Balance Overall balance assessment: Needs assistance Sitting-balance support: No upper extremity supported;Feet unsupported Sitting balance-Leahy Scale: Fair     Standing balance support: During functional activity Standing balance-Leahy Scale: Fair                               Pertinent Vitals/Pain Pain Assessment: No/denies pain    Home Living Family/patient expects to be discharged to:: Private residence Living Arrangements: Spouse/significant other Available Help at Discharge: Family;Personal care attendant Type of Home: House Home Access: Stairs to enter Entrance Stairs-Rails: None Entrance Stairs-Number of Steps: 3 and 1   Home Layout: One level Home Equipment: Conservation officer, nature (2 wheels);Cane - single point;Shower seat;Wheelchair - manual      Prior Function Prior Level of Function : Needs assist             Mobility Comments: from previous admission: ambulatory with RW ADLs Comments: assist required with caregiver assistance.  info retreived from previous admission and from [place] [facility]: Right    Extremity/Trunk Assessment   Upper Extremity Assessment Upper Extremity Assessment: Difficult  to assess due to impaired cognition    Lower Extremity Assessment Lower Extremity Assessment: Difficult to assess due to impaired cognition    Cervical / Trunk Assessment Cervical / Trunk Assessment: Kyphotic  Communication   Communication: No difficulties  Cognition Arousal/Alertness: Awake/alert Behavior During Therapy: WFL for tasks assessed/performed Overall Cognitive Status: History of cognitive impairments - at baseline                                 General Comments: Patient oriented to self. Can follow most commands.        General Comments      Exercises     Assessment/Plan    PT Assessment Patient needs continued PT services  PT Problem List Decreased mobility;Decreased strength;Decreased activity tolerance;Decreased balance;Decreased knowledge of use of DME       PT Treatment Interventions Therapeutic activities;DME instruction;Gait training;Therapeutic exercise;Patient/family education;Balance training;Functional mobility training    PT Goals (Current goals can be found in the Care Plan section)  Acute Rehab PT Goals Patient Stated Goal: HOME PT Goal Formulation: Patient unable to participate in goal setting Time For Goal Achievement: 11/21/21 Potential to Achieve Goals: Fair    Frequency Min 3X/week   Barriers to discharge        Co-evaluation PT/OT/SLP Co-Evaluation/Treatment: Yes Reason for Co-Treatment: To address functional/ADL transfers PT goals addressed during session: Mobility/safety with mobility OT goals addressed during session: ADL's and self-care       AM-PAC PT "6 Clicks" Mobility  Outcome Measure Help needed turning from your back to your side while in a flat bed without using bedrails?: A Lot Help needed moving from lying on your back to sitting on the side of a flat bed without using bedrails?: A Little Help needed moving to and from a bed to a chair (including a wheelchair)?: A Little Help needed standing up  from a chair using your arms (e.g., wheelchair or bedside chair)?: A Little Help needed to walk in hospital room?: A Little Help needed climbing 3-5 steps with a railing? : A Lot 6 Click Score: 16    End of Session Equipment Utilized During Treatment: Gait belt Activity Tolerance: Patient tolerated treatment well Patient left: in bed;with call bell/phone within reach Nurse Communication: Mobility status PT Visit Diagnosis: Muscle weakness (generalized) (M62.81);Difficulty in walking, not elsewhere classified (R26.2);Other abnormalities of gait and mobility (R26.89)    Time: 9622-2979 PT Time Calculation (min) (ACUTE ONLY): 24 min   Charges:   PT Evaluation $PT Eval Low Complexity: 1 Low          [place] Pager [phone] Office [phone]   Arianah Torgeson 11/07/2021, 2:36 PM

## 2021-11-07 NOTE — Evaluation (Signed)
Occupational Therapy Evaluation Patient Details Name: Sarah Cook MRN: [id number] DOB: 1950-11-24 Today's Date: 11/07/2021   History of Present Illness Sarah Cook is a 71 y.o. female with medical history significant of dementia hypertension, HLD, parathyroid disease with hypercalcemia. Admitted for acute encephalopathy   Clinical Impression   Sarah Cook is a 71 year old woman who presents with cognitive impairments secondary to dementia as well as generalized weakness and decreased activity tolerance. Patient requiring max assist to transfer to side of bed and min assist to stand and ambulate. Patient set up to mod assist for ADLs. Expect that patient was needing assistance with ADLs prior to admission. Patient exhibits the functional abilities to return home with 24/7 assistance and [facility] services.       Recommendations for follow up therapy are one component of a multi-disciplinary discharge planning process, led by the attending physician.  Recommendations may be updated based on patient status, additional functional criteria and insurance authorization.   Follow Up Recommendations  Home health OT    Assistance Recommended at Discharge Frequent or constant Supervision/Assistance  Functional Status Assessment  Patient has had a recent decline in their functional status and demonstrates the ability to make significant improvements in function in a reasonable and predictable amount of time.  Equipment Recommendations  None recommended by OT    Recommendations for Other Services       Precautions / Restrictions Precautions Precautions: Fall Precaution Comments: incontinent Restrictions Weight Bearing Restrictions: No      Mobility Bed Mobility Overal bed mobility: Needs Assistance Bed Mobility: Supine to Sit     Supine to sit: Max assist     General bed mobility comments: Max assist to transfer to side of bed - mostly for multimodal cues for motor planning and  initiating movement for transfer.    Transfers Overall transfer level: Needs assistance Equipment used: Rolling walker (2 wheels) Transfers: Sit to/from Stand Sit to Stand: Min assist;+2 safety/equipment           General transfer comment: Min assist to stand from stretcher with RW. min assist for ambulation - mostly for walker management as patient keeping walker to far forward. No overt loss of balance.      Balance Overall balance assessment: No apparent balance deficits (not formally assessed) Sitting-balance support: No upper extremity supported;Feet unsupported Sitting balance-Leahy Scale: Fair     Standing balance support: During functional activity Standing balance-Leahy Scale: Fair                             ADL either performed or assessed with clinical judgement   ADL Overall ADL's : Needs assistance/impaired Eating/Feeding: Set up;Sitting   Grooming: Set up;Cueing for sequencing   Upper Body Bathing: Set up;Cueing for sequencing   Lower Body Bathing: Minimal assistance;Cueing for sequencing   Upper Body Dressing : Minimal assistance;Cueing for sequencing   Lower Body Dressing: Moderate assistance;Sit to/from stand   Toilet Transfer: Minimal assistance;Rolling walker (2 wheels) Toilet Transfer Details (indicate cue type and reason): cueing for safety Toileting- Clothing Manipulation and Hygiene: Min guard;Cueing for sequencing       Functional mobility during ADLs: Minimal assistance;Rolling walker (2 wheels)       Vision   Vision Assessment?: No apparent visual deficits     Perception     Praxis      Pertinent Vitals/Pain Pain Assessment: No/denies pain     Hand Dominance Right  Extremity/Trunk Assessment Upper Extremity Assessment Upper Extremity Assessment: Difficult to assess due to impaired cognition (appears functional)   Lower Extremity Assessment Lower Extremity Assessment: Defer to PT evaluation   Cervical /  Trunk Assessment Cervical / Trunk Assessment: Kyphotic   Communication Communication Communication: No difficulties   Cognition Arousal/Alertness: Awake/alert Behavior During Therapy: WFL for tasks assessed/performed Overall Cognitive Status: History of cognitive impairments - at baseline                                 General Comments: Patient oriented to self. Can follow most commands.     General Comments       Exercises     Shoulder Instructions      Home Living Family/patient expects to be discharged to:: Private residence Living Arrangements: Spouse/significant other Available Help at Discharge: Family;Personal care attendant Type of Home: House Home Access: Stairs to enter [employer] of Steps: 3 and 1 Entrance Stairs-Rails: None Home Layout: One level     Bathroom Shower/Tub: Teacher, early years/pre: Standard Bathroom Accessibility: Yes   Home Equipment: Conservation officer, nature (2 wheels);Cane - single point;Shower seat;Wheelchair - manual          Prior Functioning/Environment Prior Level of Function : Needs assist             Mobility Comments: from previous admission: ambulatory with RW ADLs Comments: assist required with caregiver assistance. info retreived from previous admission.        OT Problem List: Decreased cognition;Decreased safety awareness;Impaired balance (sitting and/or standing);Decreased activity tolerance;Decreased knowledge of use of DME or AE      OT Treatment/Interventions:      OT Goals(Current goals can be found in the care plan section)    OT Frequency:     Barriers to D/C:            Co-evaluation PT/OT/SLP Co-Evaluation/Treatment: Yes (co eval)            AM-PAC OT "6 Clicks" Daily Activity     Outcome Measure Help from another person eating meals?: A Little Help from another person taking care of personal grooming?: A Little Help from another person toileting, which  includes using toliet, bedpan, or urinal?: A Little Help from another person bathing (including washing, rinsing, drying)?: A Little Help from another person to put on and taking off regular upper body clothing?: A Little Help from another person to put on and taking off regular lower body clothing?: A Lot 6 Click Score: 17   End of Session Equipment Utilized During Treatment: Rolling walker (2 wheels);Gait belt Nurse Communication: Mobility status  Activity Tolerance: Patient tolerated treatment well Patient left: in bed;with call bell/phone within reach  OT Visit Diagnosis: Unsteadiness on feet (R26.81);Other symptoms and signs involving cognitive function                Time: 9892-1194 OT Time Calculation (min): 24 min Charges:  OT General Charges $OT Visit: 1 Visit OT Evaluation $OT Eval Low Complexity: 1 Low  Navjot Pilgrim, OTR/L [place]  Office [phone] Pager: [place] 11/07/2021, 2:20 PM

## 2021-11-07 NOTE — Progress Notes (Signed)
ANTICOAGULATION CONSULT NOTE - Initial Consult  Pharmacy Consult for heparin >> Eliquis Indication: pulmonary embolus  Allergies  Allergen Reactions   Anesthetics, [place]ster Nausea And Vomiting   Codeine Nausea Only   Tape Other (See Comments)    Skin blisters   Amlodipine Other (See Comments) and Swelling   Cetirizine Hcl     Other reaction(s): altered consciousness   Hydrochlorothiazide Other (See Comments)   Hydrocodone Nausea And Vomiting   Onion     Does not like   Latex Itching and Rash    Irritates skin    Patient Measurements: Height: 5\' 1"  (154.9 cm) Weight: 77.2 kg (170 lb 3.1 oz) IBW/kg (Calculated) : 47.8 Heparin Dosing Weight: 65kg  Vital Signs: BP: 153/75 (01/01 1000) Pulse Rate: 84 (01/01 1000)  Labs: Recent Labs    11/06/21 1335 11/06/21 2218 11/07/21 0325  HGB 11.7*  --  9.4*  HCT 34.2*  --  28.1*  PLT 369  --  268  LABPROT 12.7  --   --   INR 1.0  --   --   CREATININE 0.99  --  0.95  CKTOTAL  --  24*  --   TROPONINIHS  --  13 11     Estimated Creatinine Clearance: 51.1 mL/min (by C-G formula based on SCr of 0.95 mg/dL).   Medical History: Past Medical History:  Diagnosis Date   Allergy    Anxiety    Breast cancer ([place])    Cataract    early signs of   Gait abnormality    Heart disease    Hemorrhoids    Hiatal hernia 11/07/2012   large   Hyperlipidemia    Hyperparathyroidism ([place])    Hypertension    Memory loss    Osteoporosis    Parathyroid disorder ([place])    Pulmonary nodules    TIA (transient ischemic attack)    Urinary incontinence     Assessment: 71 yo female with medical hx significant of dementia, hypertension, HLD, parathyroid disease with hypercalcemia, admitted for acute encephalopathy. Radiology found small PE in rt lower lobe.  Pharmacy to dose heparin.  No prior AC noted  Baseline labs WNL, Hgb 11.7, plts 369, Scr 0.99  Pharmacy consulted to assist in transitioning the patient to Eliquis.  Plan:  Will initiate  Eliquis 10 mg bid x 7 days followed by 5 mg bid thereafter Instructed RN to d/c heparin infusion at the time of the first Eliquis dose Pharmacy will sign off and follow remotely Will educate patient prior to discharge  Ulice Dash, PharmD  11/07/2021, 11:04 AM

## 2021-11-07 NOTE — Social Work (Addendum)
CSW met with Pt and husband, [name], at bedside. Per husband, Pt was d/c from [facility] on 12/30 and was set up with [facility] at that time. [facility] RN has already been to the home for initial assessment.  °Husband states that Pt was doing well ambulating at [facility] and has a walker at home. °CSW consulted RNCM who will contact [facility] for resumption of services and will also have 3-in-1 brought to room for d/c needs. ° ° °[place]. CSW brought Elequis card to room. Husband was not present at the time. CSW called husband to tell him that card would be on bedside table and informed husband that 3-in-1 had been delivered to room. °

## 2021-11-07 NOTE — Care Management (Signed)
[facility] ED RNCM received call from ED CSW covering at [facility] concerning assisting with patient's discharge needs. Resumption of HH services and DME , and medication savings card.  Sent up dated [facility] orders to [place], and called [facility] to order bedside commode.  Discussed with CSW to provide patient with Eliquis 30 day free card.  No further RNCM needs noted.

## 2021-11-07 NOTE — ED Notes (Signed)
Patient continues to remove BP cuff despite multiple reminders to keep cuff on.

## 2021-11-07 NOTE — Progress Notes (Signed)
Lower extremity venous bilateral study completed.   Please see CV Proc for preliminary results.   Bryndle Corredor, RDMS, RVT  

## 2021-11-07 NOTE — Discharge Summary (Signed)
Physician Discharge Summary  Sarah Cook WSF:[id number] DOB: 04-20-1951 DOA: 11/06/2021  PCP: Sarah Caraway, MD  Admit date: 11/06/2021 Discharge date: 11/07/2021 Admitted From: Home Disposition: Home Recommendations for Outpatient Follow-up:  Follow ups as below. Please obtain CBC/BMP/Mag at follow up Please follow up on the following pending results: PTH and [place]: PT/OT/RN/aide/CSW Equipment/Devices: Bedside commode.  Patient has rolling walker Discharge Condition: Stable CODE STATUS: DNR/DNI  Follow-up Information     Sarah Caraway, MD. Schedule an appointment as soon as possible for a visit in 1 week(s).   Specialty: Family Medicine Contact information: [place] [id number] [place], [place] Follow up.   Specialty: Home Health Services Why: Resume [facility] services Contact information: [place] [address] [place] [facility] Follow up.   Why: Bedside Commode will recieve prior to discharge from the Emergency Department Contact information: [place] [address] [phone]                Hospital Course: 71 year old F with PMH of dementia, diastolic CHF, HTN, HLD, anemia, breast cancer in remission and hypercalcemia presenting with altered mental status/confusion and elevated blood pressure, and admitted for acute encephalopathy, hypercalcemia, dehydration and hypertensive urgency.  Work-up in ED with slightly elevated D-dimer to 1.18.  CTA chest showed small LLL PE.  Started on IV heparin and IV fluid.  LE [place] negative for DVT.   Of note, patient was hospitalized from 10/18/2021 to 10/21/2021 for hypercalcemia, generalized weakness, E. coli UTI and other electrolyte abnormalities and discharged to SNF. She went home from SNF on 11/05/2021.   The next day, mental status improved.  Hypercalcemia and dehydration resolved.  LE [place]  negative for DVT.  Transitioned to p.o. Eliquis.  See individual problem list below for more on hospital course.  Discharge Diagnoses:  Acute metabolic encephalopathy in patient with history of dementia-likely from dehydration, hypercalcemia and delirium from environmental change.  Hypercalcemia and dehydration resolved.  Mental status improved. -Discussed with patient's husband about importance of good hydration -Reorientation and delirium precautions -Avoid sedating medications  Hypercalcemia: Likely from dehydration.  Cannot exclude hypercalcemia of malignancy.  PTH level 16 on 10/18/2021 making primary hyperparathyroidism less likely.  Hypercalcemia resolved with IV fluid -Follow PTHrP and repeat PTH  Hypokalemia: K3.4.  Mg within normal.  Received p.o. KCl 40x2 prior to discharge -P.o. KCl 20 mill equivalent daily for 5 days -Recheck in 1 week  Chronic diastolic CHF: Appears euvolemic. Hypertensive urgency: BP improved -Continue home Avapro -Increase hydralazine from 37.5 mg 3 times daily to 50 mg 3 times daily  Acute pulmonary embolism without cor pulmonale: CTA showed small LLL PE.  No prior history. -Discharged on starter pack Eliquis  Gastric hernia-CTA showed large gastric hernia. -Advised to stay upright for meals  Normocytic anemia: H&H at baseline.  History of breast cancer in remission -Outpatient follow-up.  Physical deconditioning: Uses rolling walker at baseline.  Lives with husband.  Went home with [facility] after 3 weeks of SNF stay on 11/05/2021..  -PT/OT/RN/aide/CSW/bedside commode ordered.  Body mass index is 32.16 kg/m.           Discharge Exam: Vitals:   11/07/21 0900 11/07/21 1000 11/07/21 1200 11/07/21 1510  BP: (!) 153/81 (!) 153/75 (!) 161/80 (!) 157/77  Pulse: 82 84 95 86  Temp:  Resp: (!) 22 14 17  (!) 21  Height:      Weight:      SpO2: 96% 97% 97% 97%  TempSrc:      BMI (Calculated):         GENERAL: No apparent distress.   Nontoxic. HEENT: MMM.  Vision and hearing grossly intact.  NECK: Supple.  No apparent JVD.  RESP: 97% on RA.  No IWOB.  Fair aeration bilaterally. CVS:  RRR. Heart sounds normal.  ABD/GI/GU: Bowel sounds present. Soft. Non tender.  MSK/EXT:  Moves extremities. No apparent deformity. No edema.  SKIN: no apparent skin lesion or wound NEURO: Awake and alert.  Oriented to self, person and place but not time.  No apparent focal neuro deficit. PSYCH: Calm. Normal affect.   Discharge Instructions  Discharge Instructions     Call MD for:  difficulty breathing, headache or visual disturbances   Complete by: As directed    Call MD for:  extreme fatigue   Complete by: As directed    Call MD for:  persistant dizziness or light-headedness   Complete by: As directed    Call MD for:  severe uncontrolled pain   Complete by: As directed    Diet general   Complete by: As directed    Discharge instructions   Complete by: As directed    It has been a pleasure taking care of you!  You were hospitalized due to confusion, elevated blood pressure and dehydration.  We also found a small blood clot in your left lung for which we have started you on blood thinner (Eliquis).  Your symptoms improved with IV fluid hydration to the point we think it is safe to let you go home and follow-up with your primary care doctor.  Please maintain adequate hydration.  Review your new medication list and the directions on your medications before you take them.  Follow-up with your primary care doctor in 1 to 2 weeks or sooner if needed.   Take care,   Increase activity slowly   Complete by: As directed       Allergies as of 11/07/2021       Reactions   Anesthetics, Ester Nausea And Vomiting   Codeine Nausea Only   Tape Other (See Comments)   Skin blisters   Amlodipine Other (See Comments), Swelling   Cetirizine Hcl    Other reaction(s): altered consciousness   Hydrochlorothiazide Other (See Comments)    Hydrocodone Nausea And Vomiting   Onion    Does not like   Latex Itching, Rash   Irritates skin        Medication List     TAKE these medications    acetaminophen 650 MG CR tablet Commonly known as: TYLENOL Take 1 tablet (650 mg total) by mouth every 8 (eight) hours as needed for pain.   Apixaban Starter Pack (10mg  and 5mg ) Commonly known as: ELIQUIS STARTER PACK Take as directed on package: start with two-5mg  tablets twice daily for 7 days. On day 8, switch to one-5mg  tablet twice daily.   apixaban 5 MG Tabs tablet Commonly known as: ELIQUIS Take 1 tablet (5 mg total) by mouth 2 (two) times daily. Start taking on: December 07, 2021   CO Q 10 PO Take 1 tablet by mouth daily.   docusate sodium 100 MG capsule Commonly known as: COLACE Take 100 mg by mouth daily.   feeding supplement Liqd Take 237 mLs by mouth 2 (two) times daily between meals.  hydrALAZINE 50 MG tablet Commonly known as: APRESOLINE Take 1 tablet (50 mg total) by mouth 3 (three) times daily. What changed:  medication strength how much to take   irbesartan 300 MG tablet Commonly known as: AVAPRO Take 300 mg by mouth daily.   lamoTRIgine 100 MG tablet Commonly known as: LaMICtal Take 1 tablet (100 mg total) by mouth 2 (two) times daily.   magnesium oxide 400 MG tablet Commonly known as: MAG-OX Take 400 mg by mouth daily.   multivitamin with minerals Tabs tablet Take 1 tablet by mouth daily.   OVER THE COUNTER MEDICATION Take 10 mg by mouth at bedtime. Tumeric Black pepper 5 mg   potassium chloride SA 20 MEQ tablet Commonly known as: KLOR-CON M Take 1 tablet (20 mEq total) by mouth daily for 5 days.   PROBIOTIC PO Take 1 capsule by mouth daily.   rosuvastatin 10 MG tablet Commonly known as: CRESTOR Take 10 mg by mouth daily.   VITAMIN C PO Take 1 tablet by mouth daily.   Vitamin D3 50 MCG (2000 UT) Tabs Take 2,000 Units by mouth daily.               Durable Medical  Equipment  (From admission, onward)           Start     Ordered   11/07/21 1144  For home use only DME Bedside commode  Once       Comments: 3 in 1 commode  Question:  Patient needs a bedside commode to treat with the following condition  Answer:  Generalized weakness   11/07/21 1143            Consultations: None  Procedures/Studies   DG Chest 2 View  Result Date: 11/06/2021 CLINICAL DATA:  Altered mental status. EXAM: CHEST - 2 VIEW COMPARISON:  10/18/2021 FINDINGS: Cardiac silhouette is normal in size and configuration. Small hiatal hernia. No mediastinal or hilar masses or adenopathy. Clear lungs.  No pleural effusion or pneumothorax. Previous right breast surgery. Skeletal structures are intact. IMPRESSION: No active cardiopulmonary disease. Electronically Signed   By: Lajean Manes M.D.   On: 11/06/2021 14:05   CT HEAD WO CONTRAST (5MM)  Result Date: 11/06/2021 CLINICAL DATA:  Delirium EXAM: CT HEAD WITHOUT CONTRAST TECHNIQUE: Contiguous axial images were obtained from the base of the skull through the vertex without intravenous contrast. COMPARISON:  October 18, 2021 FINDINGS: Brain: No evidence of acute infarction, hemorrhage, hydrocephalus, extra-axial collection or mass lesion/mass effect. There is chronic diffuse atrophy. Chronic bilateral periventricular white matter small vessel ischemic changes are identified. Vascular: No hyperdense vessel is identified. Skull: Normal. Negative for fracture or focal lesion. Sinuses/Orbits: No acute finding. Other: None. IMPRESSION: 1. No focal acute intracranial abnormality identified. 2. Chronic diffuse atrophy. Chronic bilateral periventricular white matter small vessel ischemic change. Electronically Signed   By: Abelardo Diesel M.D.   On: 11/06/2021 15:20   CT Head Wo Contrast  Result Date: 10/18/2021 CLINICAL DATA:  Confusion, weakness, head injury. EXAM: CT HEAD WITHOUT CONTRAST TECHNIQUE: Contiguous axial images were  obtained from the base of the skull through the vertex without intravenous contrast. COMPARISON:  October 11, 2017. FINDINGS: Brain: Mild chronic ischemic white matter disease is noted. No mass effect or midline shift is noted. Ventricular size is within normal limits. There is no evidence of mass lesion, hemorrhage or acute infarction. Vascular: No hyperdense vessel or unexpected calcification. Skull: Normal. Negative for fracture or focal lesion. Sinuses/Orbits: No acute finding.  Other: None. IMPRESSION: No acute intracranial abnormality seen. Electronically Signed   By: Marijo Conception M.D.   On: 10/18/2021 09:51   CT Angio Chest Pulmonary Embolism (PE) W or WO Contrast  Result Date: 11/07/2021 CLINICAL DATA:  Suspected pulmonary embolism. EXAM: CT ANGIOGRAPHY CHEST WITH CONTRAST TECHNIQUE: Multidetector CT imaging of the chest was performed using the standard protocol during bolus administration of intravenous contrast. Multiplanar CT image reconstructions and MIPs were obtained to evaluate the vascular anatomy. CONTRAST:  62mL OMNIPAQUE IOHEXOL 350 MG/ML SOLN COMPARISON:  November 26, 2013 FINDINGS: Cardiovascular: A small amount of intraluminal low attenuation is seen within a segmental lower lobe branch of the left pulmonary artery (axial CT images 50 through 55, CT series 7). Normal heart size with mild coronary artery calcification. No pericardial effusion. Mediastinum/Nodes: No enlarged mediastinal, hilar, or axillary lymph nodes. Thyroid gland, trachea, and esophagus demonstrate no significant findings. Lungs/Pleura: Mild atelectasis is seen within the posterior aspects of the bilateral lung bases. There is no evidence of a pleural effusion or pneumothorax. Upper Abdomen: There is a very large gastric hernia. Surgical clips are seen within the gallbladder fossa. Musculoskeletal: Degenerative changes seen throughout the thoracic spine. Review of the MIP images confirms the above findings. IMPRESSION: 1.  Small amount of pulmonary embolism within a segmental lower lobe branch of the left pulmonary artery. 2. Very large gastric hernia. 3. Evidence of prior cholecystectomy. Electronically Signed   By: Virgina Norfolk M.D.   On: 11/07/2021 02:58   US RENAL  Result Date: 10/18/2021 CLINICAL DATA:  AK I EXAM: RENAL / URINARY TRACT ULTRASOUND COMPLETE COMPARISON:  None. FINDINGS: Right Kidney: Renal measurements: 10.1 x 4.4 x 5.4 cm = volume: 124 mL. Echogenicity within normal limits. No mass or hydronephrosis visualized. Left Kidney: Renal measurements: 9.4 x 5.0 x 5.0 cm = volume: 122 mL. Echogenicity within normal limits. No mass or hydronephrosis visualized. Bladder: Trabeculated bladder wall.  Bilateral jets visualized. Other: Somewhat limited exam due to overlying bowel gas. IMPRESSION: 1. No evidence of hydronephrosis. 2. Trabeculated bladder wall, finding can be seen in the setting of chronic bladder dysfunction. Electronically Signed   By: Yetta Glassman M.D.   On: 10/18/2021 14:41   DG Chest Port 1 View  Result Date: 10/18/2021 CLINICAL DATA:  Weakness EXAM: PORTABLE CHEST 1 VIEW COMPARISON:  07/20/2021 FINDINGS: The heart size and mediastinal contours are within normal limits. Hiatal hernia. Both lungs are clear. The visualized skeletal structures are unremarkable. IMPRESSION: No active disease. Electronically Signed   By: Davina Poke D.O.   On: 10/18/2021 10:28   VAS [place] LOWER EXTREMITY VENOUS (DVT)  Result Date: 11/07/2021  Lower Venous DVT Study Patient Name:  Sarah Cook  Date of Exam:   11/07/2021 Medical Rec #: [id number]       Accession #:    [id number] Date of Birth: Jul 02, 1951       Patient Gender: F Patient Age:   70 years Exam Location:  [facility] Procedure:      VAS [place] LOWER EXTREMITY VENOUS (DVT) Referring Phys: Bretta Bang Shaketha Jeon --------------------------------------------------------------------------------  Indications: Pulmonary embolism.  Anticoagulation: Heparin.  Comparison Study: No prior studies. Performing Technologist: Darlin Coco RDMS, RVT  Examination Guidelines: A complete evaluation includes B-mode imaging, spectral Doppler, color Doppler, and power Doppler as needed of all accessible portions of each vessel. Bilateral testing is considered an integral part of a complete examination. Limited examinations for reoccurring indications may be performed as noted. The reflux portion of  the exam is performed with the patient in reverse Trendelenburg.  +---------+---------------+---------+-----------+----------+--------------+  RIGHT     Compressibility Phasicity Spontaneity Properties Thrombus Aging  +---------+---------------+---------+-----------+----------+--------------+  CFV       Full            Yes       Yes                                    +---------+---------------+---------+-----------+----------+--------------+  SFJ       Full                                                             +---------+---------------+---------+-----------+----------+--------------+  FV Prox   Full                                                             +---------+---------------+---------+-----------+----------+--------------+  FV Mid    Full                                                             +---------+---------------+---------+-----------+----------+--------------+  FV Distal Full                                                             +---------+---------------+---------+-----------+----------+--------------+  PFV       Full                                                             +---------+---------------+---------+-----------+----------+--------------+  POP       Full            Yes       Yes                                    +---------+---------------+---------+-----------+----------+--------------+  PTV       Full                                                             +---------+---------------+---------+-----------+----------+--------------+  PERO       Full                                                             +---------+---------------+---------+-----------+----------+--------------+  Gastroc   Full                                                             +---------+---------------+---------+-----------+----------+--------------+   +---------+---------------+---------+-----------+----------+--------------+  LEFT      Compressibility Phasicity Spontaneity Properties Thrombus Aging  +---------+---------------+---------+-----------+----------+--------------+  CFV       Full            Yes       Yes                                    +---------+---------------+---------+-----------+----------+--------------+  SFJ       Full                                                             +---------+---------------+---------+-----------+----------+--------------+  FV Prox   Full                                                             +---------+---------------+---------+-----------+----------+--------------+  FV Mid    Full                                                             +---------+---------------+---------+-----------+----------+--------------+  FV Distal Full                                                             +---------+---------------+---------+-----------+----------+--------------+  PFV       Full                                                             +---------+---------------+---------+-----------+----------+--------------+  POP       Full            Yes       Yes                                    +---------+---------------+---------+-----------+----------+--------------+  PTV       Full                                                             +---------+---------------+---------+-----------+----------+--------------+  PERO      Full                                                             +---------+---------------+---------+-----------+----------+--------------+  Gastroc   Full                                                              +---------+---------------+---------+-----------+----------+--------------+     Summary: RIGHT: - There is no evidence of deep vein thrombosis in the lower extremity.  - No cystic structure found in the popliteal fossa.  LEFT: - There is no evidence of deep vein thrombosis in the lower extremity.  - No cystic structure found in the popliteal fossa.  *See table(s) above for measurements and observations. Electronically signed by Monica Martinez MD on 11/07/2021 at 2:13:37 PM.    Final        The results of significant diagnostics from this hospitalization (including imaging, microbiology, ancillary and laboratory) are listed below for reference.     Microbiology: Recent Results (from the past 240 hour(s))  Resp Panel by RT-PCR (Flu A&B, Covid) Nasopharyngeal Swab     Status: None   Collection Time: 11/06/21  4:17 PM   Specimen: Nasopharyngeal Swab; Nasopharyngeal(NP) swabs in vial transport medium  Result Value Ref Range Status   SARS Coronavirus 2 by RT PCR NEGATIVE NEGATIVE Final    Comment: (NOTE) SARS-CoV-2 target nucleic acids are NOT DETECTED.  The SARS-CoV-2 RNA is generally detectable in upper respiratory specimens during the acute phase of infection. The lowest concentration of SARS-CoV-2 viral copies this assay can detect is 138 copies/mL. A negative result does not preclude SARS-Cov-2 infection and should not be used as the sole basis for treatment or other patient management decisions. A negative result may occur with  improper specimen collection/handling, submission of specimen other than nasopharyngeal swab, presence of viral mutation(s) within the areas targeted by this assay, and inadequate number of viral copies(<138 copies/mL). A negative result must be combined with clinical observations, patient history, and epidemiological information. The expected result is Negative.  Fact Sheet for Patients:   EntrepreneurPulse.com.au  Fact Sheet for Healthcare Providers:  IncredibleEmployment.be  This test is no t yet approved or cleared by the [place] FDA and  has been authorized for detection and/or diagnosis of SARS-CoV-2 by FDA under an Emergency Use Authorization (EUA). This EUA will remain  in effect (meaning this test can be used) for the duration of the COVID-19 declaration under Section 564(b)(1) of the Act, 21 U.S.C.section 360bbb-3(b)(1), unless the authorization is terminated  or revoked sooner.       Influenza A by PCR NEGATIVE NEGATIVE Final   Influenza B by PCR NEGATIVE NEGATIVE Final    Comment: (NOTE) The Xpert Xpress SARS-CoV-2/FLU/RSV plus assay is intended as an aid in the diagnosis of influenza from Nasopharyngeal swab specimens and should not be used as a sole basis for treatment. Nasal washings and aspirates are unacceptable for Xpert Xpress SARS-CoV-2/FLU/RSV testing.  Fact Sheet for Patients: EntrepreneurPulse.com.au  Fact Sheet for Healthcare Providers: IncredibleEmployment.be  This test is not yet approved  or cleared by the [place] and has been authorized for detection and/or diagnosis of SARS-CoV-2 by FDA under an Emergency Use Authorization (EUA). This EUA will remain in effect (meaning this test can be used) for the duration of the COVID-19 declaration under Section 564(b)(1) of the Act, 21 U.S.C. section 360bbb-3(b)(1), unless the authorization is terminated or revoked.  Performed at [facility], [place] [address] [place], [place] [address]      Labs:  CBC: Recent Labs  Lab 11/06/21 1335 11/06/21 2218 11/07/21 0325  WBC 9.3  --  7.1  NEUTROABS 7.4 5.7 5.1  HGB 11.7*  --  9.4*  HCT 34.2*  --  28.1*  MCV 95.3  --  96.6  PLT 369  --  268   BMP &GFR Recent Labs  Lab 11/06/21 1335 11/06/21 2218 11/07/21 0325  NA 139  --  139  K  3.2*  --  3.4*  CL 104  --  108  CO2 25  --  25  GLUCOSE 106*  --  97  BUN 18  --  14  CREATININE 0.99  --  0.95  CALCIUM 11.1*  --  9.9  MG  --  2.4 2.0  PHOS  --  2.6 2.5   Estimated Creatinine Clearance: 51.1 mL/min (by C-G formula based on SCr of 0.95 mg/dL). Liver & Pancreas: Recent Labs  Lab 11/06/21 1335 11/06/21 2218 11/07/21 0325  AST 18 16 14*  ALT 12 11 11   ALKPHOS 50 46 42  BILITOT 1.8* 1.7* 1.6*  PROT 7.5 6.6 5.9*  ALBUMIN 4.5 4.0 3.6   No results for input(s): LIPASE, AMYLASE in the last 168 hours. Recent Labs  Lab 11/06/21 1335  AMMONIA 13   Diabetic: No results for input(s): HGBA1C in the last 72 hours. Recent Labs  Lab 11/06/21 1414  GLUCAP 105*   Cardiac Enzymes: Recent Labs  Lab 11/06/21 2218  CKTOTAL 24*   No results for input(s): PROBNP in the last 8760 hours. Coagulation Profile: Recent Labs  Lab 11/06/21 1335  INR 1.0   Thyroid Function Tests: Recent Labs    11/07/21 0325  TSH 1.975   Lipid Profile: No results for input(s): CHOL, HDL, LDLCALC, TRIG, CHOLHDL, LDLDIRECT in the last 72 hours. Anemia Panel: Recent Labs    11/07/21 0325  VITAMINB12 366  FOLATE 10.7  FERRITIN 97  TIBC 242*  IRON 96  RETICCTPCT 4.3*   Urine analysis:    Component Value Date/Time   COLORURINE YELLOW 11/06/2021 1324   APPEARANCEUR HAZY (A) 11/06/2021 1324   LABSPEC 1.013 11/06/2021 1324   PHURINE 6.0 11/06/2021 1324   GLUCOSEU NEGATIVE 11/06/2021 1324   HGBUR NEGATIVE 11/06/2021 1324   BILIRUBINUR NEGATIVE 11/06/2021 1324   KETONESUR 5 (A) 11/06/2021 1324   PROTEINUR NEGATIVE 11/06/2021 1324   UROBILINOGEN 0.2 02/20/2009 1445   NITRITE NEGATIVE 11/06/2021 1324   LEUKOCYTESUR SMALL (A) 11/06/2021 1324   Sepsis Labs: Invalid input(s): PROCALCITONIN, LACTICIDVEN   Time coordinating discharge: 55 minutes  SIGNED:  Mercy Riding, MD  [facility] 11/07/2021, 3:22 PM

## 2021-11-07 NOTE — Discharge Instructions (Signed)
Information on my medicine - ELIQUIS (apixaban)  This medication education was reviewed with me or my healthcare representative as part of my discharge preparation.  The pharmacist that spoke with me during my hospital stay was:    Why was Eliquis prescribed for you? Eliquis was prescribed to treat blood clots that may have been found in the veins of your legs (deep vein thrombosis) or in your lungs (pulmonary embolism) and to reduce the risk of them occurring again.  What do You need to know about Eliquis ? The starting dose is 10 mg (two 5 mg tablets) taken TWICE daily for the FIRST SEVEN (7) DAYS, then on 11/14/21  the dose is reduced to ONE 5 mg tablet taken TWICE daily.  Eliquis may be taken with or without food.   Try to take the dose about the same time in the morning and in the evening. If you have difficulty swallowing the tablet whole please discuss with your pharmacist how to take the medication safely.  Take Eliquis exactly as prescribed and DO NOT stop taking Eliquis without talking to the doctor who prescribed the medication.  Stopping may increase your risk of developing a new blood clot.  Refill your prescription before you run out.  After discharge, you should have regular check-up appointments with your healthcare provider that is prescribing your Eliquis.    What do you do if you miss a dose? If a dose of ELIQUIS is not taken at the scheduled time, take it as soon as possible on the same day and twice-daily administration should be resumed. The dose should not be doubled to make up for a missed dose.  Important Safety Information A possible side effect of Eliquis is bleeding. You should call your healthcare provider right away if you experience any of the following: Bleeding from an injury or your nose that does not stop. Unusual colored urine (red or dark brown) or unusual colored stools (red or black). Unusual bruising for unknown reasons. A serious fall or if  you hit your head (even if there is no bleeding).  Some medicines may interact with Eliquis and might increase your risk of bleeding or clotting while on Eliquis. To help avoid this, consult your healthcare provider or pharmacist prior to using any new prescription or non-prescription medications, including herbals, vitamins, non-steroidal anti-inflammatory drugs (NSAIDs) and supplements.  This website has more information on Eliquis (apixaban): http://www.eliquis.com/eliquis/home

## 2021-11-09 LAB — CALCIUM, IONIZED: Calcium, Ionized, Serum: 6.2 mg/dL — ABNORMAL HIGH (ref 4.5–5.6)

## 2021-11-09 LAB — PARATHYROID HORMONE, INTACT (NO CA): PTH: 35 pg/mL (ref 15–65)

## 2021-11-15 DIAGNOSIS — R296 Repeated falls: Secondary | ICD-10-CM | POA: Diagnosis not present

## 2021-11-15 DIAGNOSIS — I2510 Atherosclerotic heart disease of native coronary artery without angina pectoris: Secondary | ICD-10-CM | POA: Diagnosis not present

## 2021-11-15 DIAGNOSIS — I2693 Single subsegmental pulmonary embolism without acute cor pulmonale: Secondary | ICD-10-CM | POA: Diagnosis not present

## 2021-11-15 DIAGNOSIS — K582 Mixed irritable bowel syndrome: Secondary | ICD-10-CM | POA: Diagnosis not present

## 2021-11-15 DIAGNOSIS — E785 Hyperlipidemia, unspecified: Secondary | ICD-10-CM | POA: Diagnosis not present

## 2021-11-15 DIAGNOSIS — I10 Essential (primary) hypertension: Secondary | ICD-10-CM | POA: Diagnosis not present

## 2021-11-15 DIAGNOSIS — G9341 Metabolic encephalopathy: Secondary | ICD-10-CM | POA: Diagnosis not present

## 2021-11-15 DIAGNOSIS — F0390 Unspecified dementia without behavioral disturbance: Secondary | ICD-10-CM | POA: Diagnosis not present

## 2021-11-16 LAB — PTH-RELATED PEPTIDE: PTH-related peptide: <2 pmol/L

## 2022-01-06 DIAGNOSIS — R32 Unspecified urinary incontinence: Secondary | ICD-10-CM | POA: Diagnosis not present

## 2022-01-06 DIAGNOSIS — Z7901 Long term (current) use of anticoagulants: Secondary | ICD-10-CM | POA: Diagnosis not present

## 2022-01-06 DIAGNOSIS — K5900 Constipation, unspecified: Secondary | ICD-10-CM | POA: Diagnosis not present

## 2022-01-06 DIAGNOSIS — F325 Major depressive disorder, single episode, in full remission: Secondary | ICD-10-CM | POA: Diagnosis not present

## 2022-01-06 DIAGNOSIS — Z7409 Other reduced mobility: Secondary | ICD-10-CM | POA: Diagnosis not present

## 2022-01-06 DIAGNOSIS — I739 Peripheral vascular disease, unspecified: Secondary | ICD-10-CM | POA: Diagnosis not present

## 2022-01-06 DIAGNOSIS — E785 Hyperlipidemia, unspecified: Secondary | ICD-10-CM | POA: Diagnosis not present

## 2022-01-06 DIAGNOSIS — I10 Essential (primary) hypertension: Secondary | ICD-10-CM | POA: Diagnosis not present

## 2022-01-06 DIAGNOSIS — F0390 Unspecified dementia without behavioral disturbance: Secondary | ICD-10-CM | POA: Diagnosis not present

## 2022-01-24 ENCOUNTER — Other Ambulatory Visit: Payer: Self-pay

## 2022-01-24 ENCOUNTER — Emergency Department (HOSPITAL_COMMUNITY): Payer: Medicare PPO

## 2022-01-24 ENCOUNTER — Inpatient Hospital Stay (HOSPITAL_COMMUNITY)
Admission: EM | Admit: 2022-01-24 | Discharge: 2022-01-27 | DRG: 641 | Disposition: A | Discharge status: Skilled Nursing Facility | Payer: Medicare PPO | Attending: Internal Medicine | Admitting: Internal Medicine

## 2022-01-24 ENCOUNTER — Encounter (HOSPITAL_COMMUNITY): Payer: Self-pay

## 2022-01-24 DIAGNOSIS — Z7901 Long term (current) use of anticoagulants: Secondary | ICD-10-CM

## 2022-01-24 DIAGNOSIS — Z91018 Allergy to other foods: Secondary | ICD-10-CM | POA: Diagnosis not present

## 2022-01-24 DIAGNOSIS — K582 Mixed irritable bowel syndrome: Secondary | ICD-10-CM | POA: Diagnosis not present

## 2022-01-24 DIAGNOSIS — T68XXXA Hypothermia, initial encounter: Secondary | ICD-10-CM | POA: Diagnosis not present

## 2022-01-24 DIAGNOSIS — E785 Hyperlipidemia, unspecified: Secondary | ICD-10-CM | POA: Diagnosis present

## 2022-01-24 DIAGNOSIS — Z8673 Personal history of transient ischemic attack (TIA), and cerebral infarction without residual deficits: Secondary | ICD-10-CM | POA: Diagnosis not present

## 2022-01-24 DIAGNOSIS — Z9049 Acquired absence of other specified parts of digestive tract: Secondary | ICD-10-CM

## 2022-01-24 DIAGNOSIS — Z9011 Acquired absence of right breast and nipple: Secondary | ICD-10-CM | POA: Diagnosis not present

## 2022-01-24 DIAGNOSIS — K449 Diaphragmatic hernia without obstruction or gangrene: Secondary | ICD-10-CM | POA: Diagnosis not present

## 2022-01-24 DIAGNOSIS — E669 Obesity, unspecified: Secondary | ICD-10-CM

## 2022-01-24 DIAGNOSIS — Z86711 Personal history of pulmonary embolism: Secondary | ICD-10-CM | POA: Diagnosis not present

## 2022-01-24 DIAGNOSIS — F411 Generalized anxiety disorder: Secondary | ICD-10-CM | POA: Diagnosis not present

## 2022-01-24 DIAGNOSIS — Z888 Allergy status to other drugs, medicaments and biological substances status: Secondary | ICD-10-CM

## 2022-01-24 DIAGNOSIS — F39 Unspecified mood [affective] disorder: Secondary | ICD-10-CM | POA: Diagnosis present

## 2022-01-24 DIAGNOSIS — I10 Essential (primary) hypertension: Secondary | ICD-10-CM | POA: Diagnosis not present

## 2022-01-24 DIAGNOSIS — D638 Anemia in other chronic diseases classified elsewhere: Secondary | ICD-10-CM | POA: Diagnosis not present

## 2022-01-24 DIAGNOSIS — F419 Anxiety disorder, unspecified: Secondary | ICD-10-CM | POA: Diagnosis present

## 2022-01-24 DIAGNOSIS — E876 Hypokalemia: Secondary | ICD-10-CM | POA: Diagnosis not present

## 2022-01-24 DIAGNOSIS — R4701 Aphasia: Secondary | ICD-10-CM | POA: Diagnosis not present

## 2022-01-24 DIAGNOSIS — R638 Other symptoms and signs concerning food and fluid intake: Secondary | ICD-10-CM

## 2022-01-24 DIAGNOSIS — Z853 Personal history of malignant neoplasm of breast: Secondary | ICD-10-CM

## 2022-01-24 DIAGNOSIS — E213 Hyperparathyroidism, unspecified: Secondary | ICD-10-CM | POA: Diagnosis present

## 2022-01-24 DIAGNOSIS — E440 Moderate protein-calorie malnutrition: Secondary | ICD-10-CM | POA: Diagnosis not present

## 2022-01-24 DIAGNOSIS — Z91040 Latex allergy status: Secondary | ICD-10-CM

## 2022-01-24 DIAGNOSIS — H269 Unspecified cataract: Secondary | ICD-10-CM | POA: Diagnosis present

## 2022-01-24 DIAGNOSIS — Z66 Do not resuscitate: Secondary | ICD-10-CM | POA: Diagnosis present

## 2022-01-24 DIAGNOSIS — N3289 Other specified disorders of bladder: Secondary | ICD-10-CM | POA: Diagnosis not present

## 2022-01-24 DIAGNOSIS — M6281 Muscle weakness (generalized): Secondary | ICD-10-CM | POA: Diagnosis not present

## 2022-01-24 DIAGNOSIS — I110 Hypertensive heart disease with heart failure: Secondary | ICD-10-CM | POA: Diagnosis not present

## 2022-01-24 DIAGNOSIS — F0390 Unspecified dementia without behavioral disturbance: Secondary | ICD-10-CM | POA: Diagnosis not present

## 2022-01-24 DIAGNOSIS — Z7401 Bed confinement status: Secondary | ICD-10-CM | POA: Diagnosis not present

## 2022-01-24 DIAGNOSIS — Z20822 Contact with and (suspected) exposure to covid-19: Secondary | ICD-10-CM | POA: Diagnosis not present

## 2022-01-24 DIAGNOSIS — Z79899 Other long term (current) drug therapy: Secondary | ICD-10-CM

## 2022-01-24 DIAGNOSIS — R4182 Altered mental status, unspecified: Secondary | ICD-10-CM | POA: Diagnosis not present

## 2022-01-24 DIAGNOSIS — F32A Depression, unspecified: Secondary | ICD-10-CM | POA: Diagnosis not present

## 2022-01-24 DIAGNOSIS — R404 Transient alteration of awareness: Secondary | ICD-10-CM | POA: Diagnosis not present

## 2022-01-24 DIAGNOSIS — Z6832 Body mass index (BMI) 32.0-32.9, adult: Secondary | ICD-10-CM

## 2022-01-24 DIAGNOSIS — Z885 Allergy status to narcotic agent status: Secondary | ICD-10-CM

## 2022-01-24 DIAGNOSIS — R531 Weakness: Secondary | ICD-10-CM | POA: Diagnosis not present

## 2022-01-24 DIAGNOSIS — I5032 Chronic diastolic (congestive) heart failure: Secondary | ICD-10-CM | POA: Diagnosis not present

## 2022-01-24 DIAGNOSIS — F0393 Unspecified dementia, unspecified severity, with mood disturbance: Secondary | ICD-10-CM | POA: Diagnosis not present

## 2022-01-24 DIAGNOSIS — Z8249 Family history of ischemic heart disease and other diseases of the circulatory system: Secondary | ICD-10-CM

## 2022-01-24 DIAGNOSIS — Z803 Family history of malignant neoplasm of breast: Secondary | ICD-10-CM

## 2022-01-24 LAB — RESP PANEL BY RT-PCR (FLU A&B, COVID) ARPGX2
Influenza A by PCR: NEGATIVE
Influenza B by PCR: NEGATIVE
SARS Coronavirus 2 by RT PCR: NEGATIVE

## 2022-01-24 LAB — CBC WITH DIFFERENTIAL/PLATELET
Abs Immature Granulocytes: 0.03 10*3/uL (ref 0.00–0.07)
Basophils Absolute: 0.1 10*3/uL (ref 0.0–0.1)
Basophils Relative: 1 %
Eosinophils Absolute: 0.1 10*3/uL (ref 0.0–0.5)
Eosinophils Relative: 1 %
HCT: 36.5 % (ref 36.0–46.0)
Hemoglobin: 12.5 g/dL (ref 12.0–15.0)
Immature Granulocytes: 0 %
Lymphocytes Relative: 14 %
Lymphs Abs: 1 10*3/uL (ref 0.7–4.0)
MCH: 31.5 pg (ref 26.0–34.0)
MCHC: 34.2 g/dL (ref 30.0–36.0)
MCV: 91.9 fL (ref 80.0–100.0)
Monocytes Absolute: 0.5 10*3/uL (ref 0.1–1.0)
Monocytes Relative: 7 %
Neutro Abs: 5.8 10*3/uL (ref 1.7–7.7)
Neutrophils Relative %: 77 %
Platelets: 242 10*3/uL (ref 150–400)
RBC: 3.97 MIL/uL (ref 3.87–5.11)
RDW: 12.6 % (ref 11.5–15.5)
WBC: 7.5 10*3/uL (ref 4.0–10.5)
nRBC: 0 % (ref 0.0–0.2)

## 2022-01-24 LAB — COMPREHENSIVE METABOLIC PANEL
ALT: 10 U/L (ref 0–44)
AST: 19 U/L (ref 15–41)
Albumin: 3.7 g/dL (ref 3.5–5.0)
Alkaline Phosphatase: 37 U/L — ABNORMAL LOW (ref 38–126)
Anion gap: 8 (ref 5–15)
BUN: 24 mg/dL — ABNORMAL HIGH (ref 8–23)
CO2: 25 mmol/L (ref 22–32)
Calcium: 10.3 mg/dL (ref 8.9–10.3)
Chloride: 109 mmol/L (ref 98–111)
Creatinine, Ser: 0.95 mg/dL (ref 0.44–1.00)
GFR, Estimated: >60 mL/min (ref >60)
Glucose, Bld: 102 mg/dL — ABNORMAL HIGH (ref 70–99)
Potassium: 2.7 mmol/L — CL (ref 3.5–5.1)
Sodium: 142 mmol/L (ref 135–145)
Total Bilirubin: 0.9 mg/dL (ref 0.3–1.2)
Total Protein: 6.4 g/dL — ABNORMAL LOW (ref 6.5–8.1)

## 2022-01-24 LAB — URINALYSIS, ROUTINE W REFLEX MICROSCOPIC
Bilirubin Urine: NEGATIVE
Glucose, UA: NEGATIVE mg/dL
Hgb urine dipstick: NEGATIVE
Ketones, ur: NEGATIVE mg/dL
Nitrite: NEGATIVE
Protein, ur: NEGATIVE mg/dL
Specific Gravity, Urine: 1.01 (ref 1.005–1.030)
pH: 7 (ref 5.0–8.0)

## 2022-01-24 LAB — RENAL FUNCTION PANEL
Albumin: 3.7 g/dL (ref 3.5–5.0)
Anion gap: 6 (ref 5–15)
BUN: 18 mg/dL (ref 8–23)
CO2: 23 mmol/L (ref 22–32)
Calcium: 9.5 mg/dL (ref 8.9–10.3)
Chloride: 112 mmol/L — ABNORMAL HIGH (ref 98–111)
Creatinine, Ser: 0.83 mg/dL (ref 0.44–1.00)
GFR, Estimated: >60 mL/min (ref >60)
Glucose, Bld: 98 mg/dL (ref 70–99)
Phosphorus: 1.7 mg/dL — ABNORMAL LOW (ref 2.5–4.6)
Potassium: 2.9 mmol/L — ABNORMAL LOW (ref 3.5–5.1)
Sodium: 141 mmol/L (ref 135–145)

## 2022-01-24 LAB — I-STAT CHEM 8, ED
BUN: 22 mg/dL (ref 8–23)
Calcium, Ion: 1.37 mmol/L (ref 1.15–1.40)
Chloride: 106 mmol/L (ref 98–111)
Creatinine, Ser: 0.9 mg/dL (ref 0.44–1.00)
Glucose, Bld: 102 mg/dL — ABNORMAL HIGH (ref 70–99)
HCT: 33 % — ABNORMAL LOW (ref 36.0–46.0)
Hemoglobin: 11.2 g/dL — ABNORMAL LOW (ref 12.0–15.0)
Potassium: 2.8 mmol/L — ABNORMAL LOW (ref 3.5–5.1)
Sodium: 144 mmol/L (ref 135–145)
TCO2: 29 mmol/L (ref 22–32)

## 2022-01-24 LAB — URINALYSIS, MICROSCOPIC (REFLEX)

## 2022-01-24 LAB — LACTIC ACID, PLASMA: Lactic Acid, Venous: 0.6 mmol/L (ref 0.5–1.9)

## 2022-01-24 LAB — CBG MONITORING, ED: Glucose-Capillary: 98 mg/dL (ref 70–99)

## 2022-01-24 LAB — MAGNESIUM: Magnesium: 2 mg/dL (ref 1.7–2.4)

## 2022-01-24 LAB — CK: Total CK: 36 U/L — ABNORMAL LOW (ref 38–234)

## 2022-01-24 MED ORDER — LAMOTRIGINE 100 MG PO TABS
100.0000 mg | ORAL_TABLET | Freq: Two times a day (BID) | ORAL | Status: DC
  Administered 2022-01-24 – 2022-01-27 (×6): 100 mg via ORAL
  Filled 2022-01-24 (×6): qty 1

## 2022-01-24 MED ORDER — HYDRALAZINE HCL 20 MG/ML IJ SOLN
10.0000 mg | Freq: Three times a day (TID) | INTRAMUSCULAR | Status: DC | PRN
  Administered 2022-01-24: 10 mg via INTRAVENOUS
  Filled 2022-01-24: qty 1

## 2022-01-24 MED ORDER — MAGNESIUM OXIDE -MG SUPPLEMENT 400 (240 MG) MG PO TABS
400.0000 mg | ORAL_TABLET | Freq: Every day | ORAL | Status: DC
  Administered 2022-01-25 – 2022-01-26 (×2): 400 mg via ORAL
  Filled 2022-01-24 (×3): qty 1

## 2022-01-24 MED ORDER — ROSUVASTATIN CALCIUM 10 MG PO TABS
10.0000 mg | ORAL_TABLET | Freq: Every day | ORAL | Status: DC
  Administered 2022-01-25 – 2022-01-27 (×3): 10 mg via ORAL
  Filled 2022-01-24 (×3): qty 1

## 2022-01-24 MED ORDER — LACTATED RINGERS IV BOLUS: 500.0000 mL | Freq: Once | INTRAVENOUS | Status: DC

## 2022-01-24 MED ORDER — ADULT MULTIVITAMIN W/MINERALS CH
1.0000 | ORAL_TABLET | Freq: Every day | ORAL | Status: DC
  Administered 2022-01-24 – 2022-01-27 (×4): 1 via ORAL
  Filled 2022-01-24 (×4): qty 1

## 2022-01-24 MED ORDER — SODIUM CHLORIDE 0.9 % IV BOLUS
500.0000 mL | Freq: Once | INTRAVENOUS | Status: AC
  Administered 2022-01-24: 500 mL via INTRAVENOUS

## 2022-01-24 MED ORDER — ENSURE ENLIVE PO LIQD
237.0000 mL | Freq: Every day | ORAL | Status: DC
  Administered 2022-01-25: 237 mL via ORAL

## 2022-01-24 MED ORDER — APIXABAN 5 MG PO TABS
5.0000 mg | ORAL_TABLET | Freq: Two times a day (BID) | ORAL | Status: DC
  Administered 2022-01-24 – 2022-01-27 (×6): 5 mg via ORAL
  Filled 2022-01-24 (×6): qty 1

## 2022-01-24 MED ORDER — ACETAMINOPHEN 650 MG RE SUPP: 650.0000 mg | Freq: Four times a day (QID) | RECTAL | Status: DC | PRN

## 2022-01-24 MED ORDER — HYDRALAZINE HCL 25 MG PO TABS
37.5000 mg | ORAL_TABLET | Freq: Three times a day (TID) | ORAL | Status: DC
  Administered 2022-01-24: 37.5 mg via ORAL
  Filled 2022-01-24: qty 2

## 2022-01-24 MED ORDER — ACETAMINOPHEN 325 MG PO TABS
650.0000 mg | ORAL_TABLET | Freq: Four times a day (QID) | ORAL | Status: DC | PRN
Start: 2022-01-24 — End: 2022-01-27
  Administered 2022-01-25 – 2022-01-27 (×2): 650 mg via ORAL
  Filled 2022-01-24 (×2): qty 2

## 2022-01-24 MED ORDER — POTASSIUM CHLORIDE 10 MEQ/100ML IV SOLN
10.0000 meq | INTRAVENOUS | Status: AC
  Administered 2022-01-24 (×5): 10 meq via INTRAVENOUS
  Filled 2022-01-24 (×4): qty 100

## 2022-01-24 MED ORDER — DOCUSATE SODIUM 100 MG PO CAPS
100.0000 mg | ORAL_CAPSULE | Freq: Every day | ORAL | Status: DC
  Administered 2022-01-25 – 2022-01-26 (×2): 100 mg via ORAL
  Filled 2022-01-24 (×2): qty 1

## 2022-01-24 MED ORDER — POTASSIUM CHLORIDE 20 MEQ PO PACK
40.0000 meq | PACK | Freq: Once | ORAL | Status: AC
  Administered 2022-01-24: 40 meq via ORAL
  Filled 2022-01-24: qty 2

## 2022-01-24 MED ORDER — IRBESARTAN 300 MG PO TABS
300.0000 mg | ORAL_TABLET | Freq: Every day | ORAL | Status: DC
  Administered 2022-01-25 – 2022-01-27 (×3): 300 mg via ORAL
  Filled 2022-01-24 (×3): qty 1

## 2022-01-24 NOTE — ED Triage Notes (Signed)
Pt arrived via EMS, form home, per EMS pt spouse states she has had increasing weakness. Had increased difficulty assisting her to bed last night. EMS states they noticed foul smelling urine on scene before brief was changed. Pt offers no complaints, hx of dementia.  ?

## 2022-01-24 NOTE — ED Notes (Signed)
Potassium 2.7 ?Reported to R. Dixon ?

## 2022-01-24 NOTE — H&P (Addendum)
?History and Physical  ? ? ?Patient: Sarah Cook OFB:[id number] DOB: 1951/09/30 ?DOA: 01/24/2022 ?DOS: the patient was seen and examined on 01/24/2022 ?PCP: Cari Caraway, MD  ?Patient coming from: Home ? ?Chief Complaint:  ?Chief Complaint  ?Patient presents with  ? Weakness  ? ?HPI: Sarah Cook is a 71 y.o. female with medical history significant of HTN, anxiety, parathyroid disease, dementia. Presenting with weakness. History per husband as patient is a poor historian d/t dementia. He states she has had 2 days of weakness and confusion. He says she was "just out of it". Her appetite has been poor. She was requiring more assistance to transfer and get around the house. She didn't have any falls. He required help from a neighbor in transferring her yesterday evening. This morning he was unable to get her transferred, so he called for EMS. He denies any other aggravating or alleviating factors.  ?  ?Review of Systems: unable to review all systems due to the inability of the patient to answer questions. ?Past Medical History:  ?Diagnosis Date  ? Allergy   ? Anxiety   ? Breast cancer ([place])   ? Cataract   ? early signs of  ? Gait abnormality   ? Heart disease   ? Hemorrhoids   ? Hiatal hernia 11/07/2012  ? large  ? Hyperlipidemia   ? Hyperparathyroidism ([place])   ? Hypertension   ? Memory loss   ? Osteoporosis   ? Parathyroid disorder ([place])   ? Pulmonary nodules   ? TIA (transient ischemic attack)   ? Urinary incontinence   ? ?Past Surgical History:  ?Procedure Laterality Date  ? BLADDER SUSPENSION    ? CESAREAN SECTION    ? x 2  ? CHOLECYSTECTOMY    ? MASTECTOMY Right   ? PARATHYROIDECTOMY    ? TOTAL ELBOW REPLACEMENT    ? TUBAL LIGATION    ? ?Social History:  reports that she has never smoked. She has never used smokeless tobacco. She reports that she does not drink alcohol and does not use drugs. ? ?Allergies  ?Allergen Reactions  ? Anesthetics, Ester Nausea And Vomiting  ? Codeine Nausea Only  ? Tape Other (See  Comments)  ?  Skin blisters  ? Amlodipine Other (See Comments) and Swelling  ? Cetirizine Hcl   ?  Other reaction(s): altered consciousness  ? Hydrochlorothiazide Other (See Comments)  ? Hydrocodone Nausea And Vomiting  ? Onion   ?  Does not like  ? Latex Itching and Rash  ?  Irritates skin  ? ? ?Family History  ?Problem Relation Age of Onset  ? Breast cancer Mother   ? Sudden death Father   ? Heart failure Father   ? Hypertension Other   ? Heart disease Other   ? Colon cancer Neg Hx   ? Esophageal cancer Neg Hx   ? Stomach cancer Neg Hx   ? Rectal cancer Neg Hx   ? ? ?Prior to Admission medications   ?Medication Sig Start Date End Date Taking? Authorizing Provider  ?acetaminophen (TYLENOL) 650 MG CR tablet Take 1 tablet (650 mg total) by mouth every 8 (eight) hours as needed for pain. ?Patient not taking: Reported on 11/07/2021 10/21/21   Flora Lipps, MD  ?apixaban (ELIQUIS) 5 MG TABS tablet Take 1 tablet (5 mg total) by mouth 2 (two) times daily. 12/07/21   Mercy Riding, MD  ?APIXABAN [place]ne [place]) VTE STARTER PACK ('10MG'$  AND '5MG'$ ) Take as directed on package: start with  two-'5mg'$  tablets twice daily for 7 days. On day 8, switch to one-'5mg'$  tablet twice daily. 11/07/21   Mercy Riding, MD  ?Ascorbic Acid (VITAMIN C PO) Take 1 tablet by mouth daily.    [provider]  ?Cholecalciferol (VITAMIN D3) 2000 units TABS Take 2,000 Units by mouth daily.     [provider]  ?Coenzyme Q10 (CO Q 10 PO) Take 1 tablet by mouth daily.    [provider]  ?docusate sodium (COLACE) 100 MG capsule Take 100 mg by mouth daily.    [provider]  ?feeding supplement (ENSURE ENLIVE / ENSURE PLUS) LIQD Take 237 mLs by mouth 2 (two) times daily between meals. 10/21/21   Pokhrel, Corrie Mckusick, MD  ?hydrALAZINE (APRESOLINE) 50 MG tablet Take 1 tablet (50 mg total) by mouth 3 (three) times daily. 11/07/21   Mercy Riding, MD  ?irbesartan (AVAPRO) 300 MG tablet Take 300 mg by mouth daily. 06/12/17   [provider]  ?lamoTRIgine (LAMICTAL) 100 MG tablet Take 1 tablet (100 mg total) by mouth 2 (two) times daily. 06/23/21   Marcial Pacas, MD  ?magnesium oxide (MAG-OX) 400 MG tablet Take 400 mg by mouth daily.    [provider]  ?Multiple Vitamin (MULTIVITAMIN WITH MINERALS) TABS tablet Take 1 tablet by mouth daily. 10/21/21 01/29/22  Pokhrel, Corrie Mckusick, MD  ?OVER THE COUNTER MEDICATION Take 10 mg by mouth at bedtime. Tumeric Black pepper 5 mg    [provider]  ?potassium chloride SA (KLOR-CON M) 20 MEQ tablet Take 1 tablet (20 mEq total) by mouth daily for 5 days. 11/07/21 11/12/21  Mercy Riding, MD  ?Probiotic Product (PROBIOTIC PO) Take 1 capsule by mouth daily.    [provider]  ?rosuvastatin (CRESTOR) 10 MG tablet Take 10 mg by mouth daily. 06/18/21   [provider]  ? ? ?Physical Exam: ?Vitals:  ? 01/24/22 0835 01/24/22 1000 01/24/22 1100  ?BP: (!) 173/93 (!) 185/88 (!) 188/83  ?Pulse: 78 76 82  ?Resp: 16 14 (!) 23  ?Temp: 98 ?F (36.7 ?C)    ?TempSrc: Oral    ?SpO2: 96% 98% 96%  ? ?General: 71 y.o. female resting in bed in NAD ?Eyes: PERRL, normal sclera ?ENMT: Nares patent w/o discharge, orophaynx clear, dentition normal, ears w/o discharge/lesions/ulcers ?Neck: Supple, trachea midline ?Cardiovascular: RRR, +S1, S2, no m/g/r, equal pulses throughout ?Respiratory: CTABL, no w/r/r, normal WOB ?GI: BS+, NDNT, no masses noted, no organomegaly noted ?MSK: No e/c/c ?Neuro: A&O x 1 (name only), no focal deficits ?Psyc: pleasantly demented, calm/cooperative  ? ?Data Reviewed: ? ?K+  2.7 ?WBC  7.5 ? ?CTH: No acute intracranial findings are seen. Atrophy. Small-vessel disease. ?CXR: Negative for acute cardiopulmonary disease. Hiatal hernia ? ?EKG: sinus, no st elevations ? ?Assessment and Plan: ?No notes have been filed under this hospital service. ?Service: Hospitalist ?Hypokalemia ?    - admit to inpt, tele ?    - continue K+ replacement, Mg2+ ok ?    - fluids ? ?Generalized weakness ?    -  secondary to above ?    - will have PT review ? ?HTN ?    - continue home regimen when confirmed ? ?HLD ?    - continue home regimen when confirmed ? ?Dementia ?    - continue home regimen when confirmed ? ?Normocytic anemia ?    - no evidence of bleed ?    - check iron studies ? ?Chronic diastolic HF ?    -  no evidence of volume overload ?    - continue home regimen when confirmed ? ?Hx of PE ?    - continue home regimen when confirmed ? ?Advance Care Planning:   Code Status: DNR ? ?Consults: None ? ?Family Communication: w/ husband by phone ? ?Severity of Illness: ?The appropriate patient status for this patient is INPATIENT. Inpatient status is judged to be reasonable and necessary in order to provide the required intensity of service to ensure the patient's safety. The patient's presenting symptoms, physical exam findings, and initial radiographic and laboratory data in the context of their chronic comorbidities is felt to place them at high risk for further clinical deterioration. Furthermore, it is not anticipated that the patient will be medically stable for discharge from the hospital within 2 midnights of admission.  ? ?* I certify that at the point of admission it is my clinical judgment that the patient will require inpatient hospital care spanning beyond 2 midnights from the point of admission due to high intensity of service, high risk for further deterioration and high frequency of surveillance required.* ? ?Author: ?Jonnie Finner, DO ?01/24/2022 11:44 AM ? ?For on call review [email or web address].  ?

## 2022-01-24 NOTE — ED Provider Notes (Signed)
?[facility] DEPT ?Provider Note ? ? ?CSN: [id number] ?Arrival date & time: 01/24/22  0827 ? ?  ? ?History ? ?Chief Complaint  ?Patient presents with  ? Weakness  ? ? ?Sarah Cook is a 71 y.o. female. ? ? ?Weakness ?Patient presents for reported generalized weakness.  Medical history includes dementia, diastolic CHF, HTN, HLD, anemia, breast cancer in remission and hypercalcemia.  Per EMS, her husband noticed increased generalized weakness at home.  He had a difficult time getting her into bed.  No other history was provided.  Patient currently denies any physical complaints. ? ?History per husband: Patient has had progressive worsening of generalized weakness.  At baseline, she is able to ambulate with a walker.  Over the last several days, she has had difficulty even transferring from bed to wheelchair with assistance.  She has dementia and confusion at baseline, however, patient's husband feels that her confusion has worsened. ?  ? ?Home Medications ?Prior to Admission medications   ?Medication Sig Start Date End Date Taking? Authorizing Provider  ?apixaban (ELIQUIS) 5 MG TABS tablet Take 1 tablet (5 mg total) by mouth 2 (two) times daily. ?Patient taking differently: Take 5 mg by mouth 2 (two) times daily with a meal. 12/07/21  Yes Mercy Riding, MD  ?Ascorbic Acid (VITAMIN C) 1000 MG tablet Take 1,000 mg by mouth daily with lunch.   Yes [provider]  ?Cholecalciferol (VITAMIN D3) 2000 units TABS Take 2,000 Units by mouth daily with lunch.   Yes [provider]  ?Coenzyme Q10 (CO Q 10) 100 MG CAPS Take 100 mg by mouth daily with supper.   Yes [provider]  ?Digestive Enzyme CAPS Take 1 capsule by mouth daily with breakfast.   Yes [provider]  ?docusate sodium (COLACE) 100 MG capsule Take 100 mg by mouth daily with supper.   Yes [provider]  ?feeding supplement (ENSURE ENLIVE / ENSURE PLUS) LIQD Take 237 mLs by mouth 2 (two)  times daily between meals. ?Patient taking differently: Take 237 mLs by mouth daily with breakfast. 10/21/21  Yes Pokhrel, [place], MD  ?hydrALAZINE (APRESOLINE) 25 MG tablet Take 37.5 mg by mouth 3 (three) times daily with meals. 12/08/21  Yes [provider]  ?irbesartan (AVAPRO) 300 MG tablet Take 300 mg by mouth daily with breakfast. 06/12/17  Yes [provider]  ?lamoTRIgine (LAMICTAL) 100 MG tablet Take 1 tablet (100 mg total) by mouth 2 (two) times daily. ?Patient taking differently: Take 100 mg by mouth 2 (two) times daily with a meal. 06/23/21  Yes Marcial Pacas, MD  ?magnesium oxide (MAG-OX) 400 MG tablet Take 400 mg by mouth daily with supper.   Yes [provider]  ?Multiple Vitamin (MULTIVITAMIN WITH MINERALS) TABS tablet Take 1 tablet by mouth daily. ?Patient taking differently: Take 1 tablet by mouth daily with lunch. 10/21/21 01/29/22 Yes Pokhrel, Laxman, MD  ?Probiotic Product (PROBIOTIC PO) Take 1 capsule by mouth daily with supper.   Yes [provider]  ?rosuvastatin (CRESTOR) 10 MG tablet Take 10 mg by mouth daily with breakfast. 06/18/21  Yes [provider]  ?TURMERIC CURCUMIN PO Take 1,000 mg by mouth daily with supper.   Yes [provider]  ?potassium chloride SA (KLOR-CON M) 20 MEQ tablet Take 1 tablet (20 mEq total) by mouth daily for 5 days. ?Patient not taking: Reported on 01/24/2022 11/07/21 11/12/21  Mercy Riding, MD  ?   ? ?Allergies    ?Anesthetics,  ester; Codeine; Tape; Amlodipine; Cetirizine hcl; Hydrochlorothiazide; Hydrocodone; Onion; and Latex   ? ?Review of Systems   ?Review of Systems  ?Unable to perform ROS: Dementia  ? ?Physical Exam ?Updated Vital Signs ?BP (!) 173/87 (BP Location: Right Arm)   Pulse 87   Temp 99.1 ?F (37.3 ?C) (Oral)   Resp 16   Ht '5\' 1"'$  (1.549 m)   Wt 77.2 kg   SpO2 94%   BMI 32.16 kg/m?  ?Physical Exam ?Vitals and nursing note reviewed.  ?Constitutional:   ?   General: She is not in acute distress. ?    Appearance: Normal appearance. She is well-developed and normal weight. She is not ill-appearing, toxic-appearing or diaphoretic.  ?HENT:  ?   Head: Normocephalic and atraumatic.  ?   Right Ear: External ear normal.  ?   Left Ear: External ear normal.  ?   Nose: Nose normal.  ?   Mouth/Throat:  ?   Mouth: Mucous membranes are moist.  ?   Pharynx: Oropharynx is clear.  ?Eyes:  ?   General: No scleral icterus. ?   Extraocular Movements: Extraocular movements intact.  ?   Conjunctiva/sclera: Conjunctivae normal.  ?Cardiovascular:  ?   Rate and Rhythm: Normal rate and regular rhythm.  ?   Heart sounds: No murmur heard. ?Pulmonary:  ?   Effort: Pulmonary effort is normal. No respiratory distress.  ?   Breath sounds: Normal breath sounds. No wheezing or rales.  ?Chest:  ?   Chest wall: No tenderness.  ?Abdominal:  ?   Palpations: Abdomen is soft.  ?   Tenderness: There is no abdominal tenderness. There is no right CVA tenderness or left CVA tenderness.  ?Musculoskeletal:     ?   General: No swelling or deformity. Normal range of motion.  ?   Cervical back: Normal range of motion and neck supple.  ?   Right lower leg: No edema.  ?   Left lower leg: No edema.  ?Skin: ?   General: Skin is warm and dry.  ?   Capillary Refill: Capillary refill takes less than 2 seconds.  ?   Coloration: Skin is not jaundiced or pale.  ?Neurological:  ?   General: No focal deficit present.  ?   Mental Status: She is alert. She is disoriented.  ?   Cranial Nerves: No cranial nerve deficit.  ?   Sensory: No sensory deficit.  ?   Motor: No weakness.  ?   Coordination: Coordination normal.  ?Psychiatric:     ?   Mood and Affect: Mood normal.     ?   Behavior: Behavior normal.  ? ? ?ED Results / Procedures / Treatments   ?Labs ?(all labs ordered are listed, but only abnormal results are displayed) ?Labs Reviewed  ?COMPREHENSIVE METABOLIC PANEL - Abnormal; Notable for the following components:  ?    Result Value  ? Potassium 2.7 (*)   ? Glucose, Bld  102 (*)   ? BUN 24 (*)   ? Total Protein 6.4 (*)   ? Alkaline Phosphatase 37 (*)   ? All other components within normal limits  ?URINALYSIS, ROUTINE W REFLEX MICROSCOPIC - Abnormal; Notable for the following components:  ? Leukocytes,Ua MODERATE (*)   ? All other components within normal limits  ?CK - Abnormal; Notable for the following components:  ? Total CK 36 (*)   ? All other components within normal limits  ?URINALYSIS, MICROSCOPIC (REFLEX) - Abnormal; Notable for the  following components:  ? Bacteria, UA RARE (*)   ? All other components within normal limits  ?RENAL FUNCTION PANEL - Abnormal; Notable for the following components:  ? Potassium 2.9 (*)   ? Chloride 112 (*)   ? Phosphorus 1.7 (*)   ? All other components within normal limits  ?COMPREHENSIVE METABOLIC PANEL - Abnormal; Notable for the following components:  ? Potassium 2.9 (*)   ? Glucose, Bld 100 (*)   ? Total Protein 6.0 (*)   ? Albumin 3.3 (*)   ? Alkaline Phosphatase 34 (*)   ? All other components within normal limits  ?CBC - Abnormal; Notable for the following components:  ? RBC 3.53 (*)   ? Hemoglobin 11.1 (*)   ? HCT 32.3 (*)   ? All other components within normal limits  ?I-STAT CHEM 8, ED - Abnormal; Notable for the following components:  ? Potassium 2.8 (*)   ? Glucose, Bld 102 (*)   ? Hemoglobin 11.2 (*)   ? HCT 33.0 (*)   ? All other components within normal limits  ?RESP PANEL BY RT-PCR (FLU A&B, COVID) ARPGX2  ?URINE CULTURE  ?CBC WITH DIFFERENTIAL/PLATELET  ?MAGNESIUM  ?LACTIC ACID, PLASMA  ?CBG MONITORING, ED  ? ? ?EKG ?EKG Interpretation ? ?Date/Time:  Monday January 24 2022 08:39:35 EDT ?Ventricular Rate:  75 ?PR Interval:  154 ?QRS Duration: 80 ?QT Interval:  402 ?QTC Calculation: 449 ?R Axis:   47 ?Text Interpretation: Sinus rhythm Confirmed by Godfrey Pick [phone]) on 01/24/2022 9:03:24 AM ? ?Radiology ?CT HEAD WO CONTRAST ? ?Result Date: 01/24/2022 ?CLINICAL DATA:  Altered mental status EXAM: CT HEAD WITHOUT CONTRAST TECHNIQUE:  Contiguous axial images were obtained from the base of the skull through the vertex without intravenous contrast. RADIATION DOSE REDUCTION: This exam was performed according to the departmental dose-optimization pr

## 2022-01-25 DIAGNOSIS — Z853 Personal history of malignant neoplasm of breast: Secondary | ICD-10-CM

## 2022-01-25 DIAGNOSIS — E669 Obesity, unspecified: Secondary | ICD-10-CM

## 2022-01-25 DIAGNOSIS — R531 Weakness: Principal | ICD-10-CM

## 2022-01-25 DIAGNOSIS — F0390 Unspecified dementia without behavioral disturbance: Secondary | ICD-10-CM | POA: Diagnosis not present

## 2022-01-25 DIAGNOSIS — F39 Unspecified mood [affective] disorder: Secondary | ICD-10-CM

## 2022-01-25 DIAGNOSIS — E440 Moderate protein-calorie malnutrition: Secondary | ICD-10-CM | POA: Insufficient documentation

## 2022-01-25 DIAGNOSIS — I10 Essential (primary) hypertension: Secondary | ICD-10-CM

## 2022-01-25 DIAGNOSIS — Z86711 Personal history of pulmonary embolism: Secondary | ICD-10-CM

## 2022-01-25 DIAGNOSIS — R638 Other symptoms and signs concerning food and fluid intake: Secondary | ICD-10-CM

## 2022-01-25 DIAGNOSIS — E876 Hypokalemia: Secondary | ICD-10-CM | POA: Diagnosis not present

## 2022-01-25 LAB — NA AND K (SODIUM & POTASSIUM), RAND UR
Potassium Urine: 41 mmol/L
Sodium, Ur: 165 mmol/L

## 2022-01-25 LAB — CBC
HCT: 32.3 % — ABNORMAL LOW (ref 36.0–46.0)
Hemoglobin: 11.1 g/dL — ABNORMAL LOW (ref 12.0–15.0)
MCH: 31.4 pg (ref 26.0–34.0)
MCHC: 34.4 g/dL (ref 30.0–36.0)
MCV: 91.5 fL (ref 80.0–100.0)
Platelets: 213 10*3/uL (ref 150–400)
RBC: 3.53 MIL/uL — ABNORMAL LOW (ref 3.87–5.11)
RDW: 12.7 % (ref 11.5–15.5)
WBC: 6.5 10*3/uL (ref 4.0–10.5)
nRBC: 0 % (ref 0.0–0.2)

## 2022-01-25 LAB — COMPREHENSIVE METABOLIC PANEL
ALT: 11 U/L (ref 0–44)
AST: 17 U/L (ref 15–41)
Albumin: 3.3 g/dL — ABNORMAL LOW (ref 3.5–5.0)
Alkaline Phosphatase: 34 U/L — ABNORMAL LOW (ref 38–126)
Anion gap: 6 (ref 5–15)
BUN: 15 mg/dL (ref 8–23)
CO2: 25 mmol/L (ref 22–32)
Calcium: 9.7 mg/dL (ref 8.9–10.3)
Chloride: 110 mmol/L (ref 98–111)
Creatinine, Ser: 0.89 mg/dL (ref 0.44–1.00)
GFR, Estimated: >60 mL/min (ref >60)
Glucose, Bld: 100 mg/dL — ABNORMAL HIGH (ref 70–99)
Potassium: 2.9 mmol/L — ABNORMAL LOW (ref 3.5–5.1)
Sodium: 141 mmol/L (ref 135–145)
Total Bilirubin: 1.2 mg/dL (ref 0.3–1.2)
Total Protein: 6 g/dL — ABNORMAL LOW (ref 6.5–8.1)

## 2022-01-25 LAB — CREATININE, URINE, RANDOM: Creatinine, Urine: 95.04 mg/dL

## 2022-01-25 MED ORDER — POTASSIUM CHLORIDE CRYS ER 20 MEQ PO TBCR: 40.0000 meq | EXTENDED_RELEASE_TABLET | ORAL | Status: DC

## 2022-01-25 MED ORDER — HYDRALAZINE HCL 25 MG PO TABS
25.0000 mg | ORAL_TABLET | Freq: Four times a day (QID) | ORAL | Status: DC | PRN
  Administered 2022-01-25 – 2022-01-26 (×2): 25 mg via ORAL
  Filled 2022-01-25 (×3): qty 1

## 2022-01-25 MED ORDER — ENSURE ENLIVE PO LIQD
237.0000 mL | Freq: Three times a day (TID) | ORAL | Status: DC
  Administered 2022-01-25 – 2022-01-27 (×5): 237 mL via ORAL

## 2022-01-25 MED ORDER — POTASSIUM PHOSPHATES 15 MMOLE/5ML IV SOLN
30.0000 mmol | Freq: Once | INTRAVENOUS | Status: AC
  Administered 2022-01-25: 30 mmol via INTRAVENOUS
  Filled 2022-01-25: qty 10

## 2022-01-25 MED ORDER — HYDRALAZINE HCL 50 MG PO TABS
50.0000 mg | ORAL_TABLET | Freq: Three times a day (TID) | ORAL | Status: DC
  Administered 2022-01-25 – 2022-01-27 (×8): 50 mg via ORAL
  Filled 2022-01-25 (×8): qty 1

## 2022-01-25 MED ORDER — POTASSIUM CHLORIDE CRYS ER 20 MEQ PO TBCR
40.0000 meq | EXTENDED_RELEASE_TABLET | ORAL | Status: AC
  Administered 2022-01-25 (×2): 40 meq via ORAL
  Filled 2022-01-25 (×2): qty 2

## 2022-01-25 NOTE — Assessment & Plan Note (Signed)
Body mass index is 32.16 kg/m?Marland Kitchen ? ?

## 2022-01-25 NOTE — Assessment & Plan Note (Signed)
In remission.

## 2022-01-25 NOTE — Assessment & Plan Note (Addendum)
In the setting of poor p.o. intake and hypokalemia.   ?-PT/OT eval ?

## 2022-01-25 NOTE — Progress Notes (Signed)
Initial Nutrition Assessment ? ?DOCUMENTATION CODES:  ? ?Non-severe (moderate) malnutrition in context of chronic illness, Obesity unspecified ? ?INTERVENTION:  ? ?-Ensure Plus High Protein po TID, each supplement provides 350 kcal and 20 grams of protein.  ? ?NUTRITION DIAGNOSIS:  ? ?Moderate Malnutrition related to chronic illness (dementia) as evidenced by mild fat depletion, mild muscle depletion. ? ?GOAL:  ? ?Patient will meet greater than or equal to 90% of their needs ? ?MONITOR:  ? ?PO intake, Supplement acceptance, Labs, Weight trends, I & O's ? ?REASON FOR ASSESSMENT:  ? ?Consult ?Assessment of nutrition requirement/status ? ?ASSESSMENT:  ? ?71 y.o. female with medical history significant of HTN, anxiety, parathyroid disease, dementia. ? ?Patient in room, no family present at bedside. Lunch tray in front of patient, uneaten. Pt drank some tea mostly.  ?Pt is currently alert/oriented x 2. Not able to give much history, answers "I don't know" to most questions. Does state she has had poor appetite PTA. Denies swallowing or chewing issues.  ?Per documentation, pt consumed 10% of breakfast this morning. Ensure  was ordered daily, will increase to TID. ? ?Per weight records, not weight changes noted.  ? ?Medications: Colace, MAG-OX, Multivitamin with minerals daily, KLOR-CON, K-Phos ? ?Labs reviewed: ? Low K, Phos ? ?NUTRITION - FOCUSED PHYSICAL EXAM: ? ?Flowsheet Row Most Recent Value  ?Orbital Region Moderate depletion  ?Upper Arm Region Moderate depletion  ?Thoracic and Lumbar Region No depletion  ?Buccal Region Mild depletion  ?Temple Region Mild depletion  ?Clavicle Bone Region Mild depletion  ?Clavicle and Acromion Bone Region Mild depletion  ?Scapular Bone Region Mild depletion  ?Dorsal Hand Moderate depletion  ?Patellar Region Mild depletion  ?Anterior Thigh Region Mild depletion  ?Posterior Calf Region Mild depletion  ?Edema (RD Assessment) None  ?Hair Reviewed  ?Eyes Reviewed  ?Mouth Reviewed  ?Skin  Reviewed  ? ?  ? ? ?Diet Order:   ?Diet Order   ? ?       ?  Diet regular Room service appropriate? Yes; Fluid consistency: Thin  Diet effective now       ?  ? ?  ?  ? ?  ? ? ?EDUCATION NEEDS:  ? ?No education needs have been identified at this time ? ?Skin:  Skin Assessment: Reviewed RN Assessment ? ?Last BM:  3/19 ? ?Height:  ? ?Ht Readings from Last 1 Encounters:  ?01/24/22 '5\' 1"'$  (1.549 m)  ? ? ?Weight:  ? ?Wt Readings from Last 1 Encounters:  ?01/24/22 77.2 kg  ? ? ? ?BMI:  Body mass index is 32.16 kg/m?. ? ?Estimated Nutritional Needs:  ? ?Kcal:  1400-1600 ? ?Protein:  65-75g ? ?Fluid:  1.6L/day ? ?Clayton Bibles, MS, RD, LDN ?Inpatient Clinical Dietitian ?Contact information available via [employer] ? ?

## 2022-01-25 NOTE — Assessment & Plan Note (Signed)
Continue home Eliquis. 

## 2022-01-25 NOTE — Assessment & Plan Note (Signed)
Continue home Lamictal ?

## 2022-01-25 NOTE — Progress Notes (Signed)
OT Cancellation Note ? ?Patient Details ?Name: Sarah Cook ?MRN: [id number] ?DOB: 07-06-1951 ? ? ?Cancelled Treatment:    Reason Eval/Treat Not Completed: Medical issues which prohibited therapy ?Nurse asking for therapy to hold off at this time secondary to potassium levels. OT to continue to follow and check back as schedule will allow.  ?Elby Blackwelder OTR/L, MS ?Acute Rehabilitation Department ?Office# [phone] ?Pager# [phone] ? ? ?01/25/2022, 3:23 PM ?

## 2022-01-25 NOTE — Assessment & Plan Note (Signed)
IV potassium phosphate 30 mmol x 1 ?Recheck in the morning ?

## 2022-01-25 NOTE — Assessment & Plan Note (Signed)
Continue home Avapro. ?Increase home hydralazine to 50 mg 3 times daily ?P.o. hydralazine 25 mg as needed ?May consider starting Aldactone after renin and aldosterone samples. ?

## 2022-01-25 NOTE — Assessment & Plan Note (Signed)
Body mass index is 32.16 kg/m?Marland Kitchen  Could be related to dementia. ?-Liberate diet ?-Consult dietitian ?

## 2022-01-25 NOTE — Assessment & Plan Note (Addendum)
Seems to be chronic issue.  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  She is hypertensive as well.  She has no diarrhea to suspect GI loss.  K remains low at 2.9 despite aggressive replacement.  Mg within normal. ?-Check urine potassium and creatinine ?-Check serum renin and aldosterone ?-P.o. KCl 40x2.  She is also getting IV potassium phosphate ?-Recheck in the morning ?

## 2022-01-25 NOTE — Progress Notes (Signed)
PT Cancellation Note ? ?Patient Details ?Name: Sarah Cook ?MRN: [id number] ?DOB: Mar 18, 1951 ? ? ?Cancelled Treatment:    Reason Eval/Treat Not Completed: Medical issues which prohibited therapy,  potasium low, RN recommends waiting until tomorrow, for potasium to be more normalized.  ?Tresa Endo PT ?Acute Rehabilitation Services ?Pager [phone] ?Office [phone] ? ? ? ?Sarah Cook ?01/25/2022, 2:42 PM ?

## 2022-01-25 NOTE — Assessment & Plan Note (Signed)
Oriented to self and "[place]".  ?-Reorientation and delirium precaution. ?

## 2022-01-25 NOTE — Assessment & Plan Note (Signed)
Nutrition Status: ?Nutrition Problem: Moderate Malnutrition ?Etiology: chronic illness (dementia) ?Signs/Symptoms: mild fat depletion, mild muscle depletion ?Interventions: Ensure Enlive (each supplement provides 350kcal and 20 grams of protein) ? ? ?

## 2022-01-25 NOTE — Progress Notes (Signed)
PROGRESS NOTE  Sarah Cook ZOX:[id number] DOB: 10-15-1951   PCP: Gweneth Dimitri, MD  Patient is from: Home.  Lives with husband.  DOA: 01/24/2022 LOS: 1  Chief complaints Chief Complaint  Patient presents with   Weakness     Brief Narrative / Interim history: 71 year old F with PMH of dementia, diastolic CHF, HTN, HLD, anemia, breast cancer in remission, hypercalcemia and hypokalemia presenting with generalized weakness, confusion and decreased appetite for 2 days and admitted for hypokalemia and generalized weakness.  K2.7.  Otherwise, CBC and CMP without significant finding.  Urinalysis, COVID-19, influenza PCR, portable CXR and CT head without contrast basically negative.  Started on p.o. and IV potassium supplementation.  Potassium remains low.    Subjective: Seen and examined earlier this morning.  No major events overnight of this morning.  No complaints but not a great historian.  She is awake and alert but only oriented to self and "[place]".  Does not know she is in the hospital.  She responds no to pain, nausea or vomiting.  Objective: Vitals:   01/24/22 2043 01/25/22 0003 01/25/22 0437 01/25/22 1430  BP: (!) 177/93 (!) 165/83 (!) 173/87 (!) 164/82  Pulse: 87 88 87 94  Resp: 16 16 16 18   Temp: 98.2 F (36.8 C) 98.1 F (36.7 C) 99.1 F (37.3 C) 97.8 F (36.6 C)  TempSrc: Oral Oral Oral Oral  SpO2: 94% 96% 94% 98%  Weight:      Height:        Examination:  GENERAL: No apparent distress.  Nontoxic. HEENT: MMM.  Vision and hearing grossly intact.  NECK: Supple.  No apparent JVD.  RESP:  No IWOB.  Fair aeration bilaterally. CVS:  RRR. Heart sounds normal.  ABD/GI/GU: BS+. Abd soft, NTND.  MSK/EXT:  Moves extremities. No apparent deformity. No edema.  SKIN: no apparent skin lesion or wound NEURO: Awake and alert.  Oriented to self and "[place]".  Follows commands.  No apparent focal neuro deficit. PSYCH: Calm. Normal affect.   Procedures:   None  Microbiology summarized: COVID-19 and influenza PCR nonreactive. Urine culture with 20,000 colonies of E. 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  She is hypertensive as well.  She has no diarrhea to suspect GI loss.  K remains low at 2.9 despite aggressive replacement.  Mg within normal. -Check urine potassium and creatinine -Check serum renin and aldosterone -P.o. KCl 40x2.  She is also getting IV potassium phosphate -Recheck in the morning  Uncontrolled hypertension Continue home Avapro. Increase home hydralazine to 50 mg 3 times daily P.o. hydralazine 25 mg as needed May consider starting Aldactone after renin and aldosterone samples.  History of pulmonary embolism Continue home Eliquis  Dementia without behavioral disturbance (HCC) Oriented to self and "[place]".  -Reorientation and delirium precaution.  Obesity (BMI 30-39.9) Body mass index is 32.16 kg/m.   Decreased oral intake Body mass index is 32.16 kg/m.  Could be related to dementia. -Liberate diet -Consult dietitian  Generalized weakness In the setting of poor p.o. intake and hypokalemia.   -PT/OT eval  Hypophosphatemia IV potassium phosphate 30 mmol x 1 Recheck in the morning  Mood disorder (HCC) Continue home Lamictal  History of breast cancer In remission.   DVT prophylaxis:   apixaban (ELIQUIS) tablet 5 mg  Code Status: DNR/DNI Family Communication: Updated patient's husband over the phone. Level of care: Telemetry Status is: Inpatient Remains inpatient appropriate because: Due  to hypokalemia and generalized weakness   Final disposition: TBD  Consultants:  None  Sch Meds:  Scheduled Meds:  apixaban  5 mg Oral BID WC   docusate sodium  100 mg Oral Q supper   feeding supplement  237 mL Oral TID WC   hydrALAZINE  50 mg Oral TID WC   irbesartan  300 mg Oral Q breakfast   lamoTRIgine  100  mg Oral BID WC   magnesium oxide  400 mg Oral Q supper   multivitamin with minerals  1 tablet Oral Daily   rosuvastatin  10 mg Oral Q breakfast   Continuous Infusions:   PRN Meds:.acetaminophen **OR** acetaminophen, hydrALAZINE  Antimicrobials: Anti-infectives (From admission, onward)    None        I have personally reviewed the following labs and images: CBC: Recent Labs  Lab 01/24/22 0858 01/24/22 0936 01/25/22 0501  WBC 7.5  --  6.5  NEUTROABS 5.8  --   --   HGB 12.5 11.2* 11.1*  HCT 36.5 33.0* 32.3*  MCV 91.9  --  91.5  PLT 242  --  213   BMP &GFR Recent Labs  Lab 01/24/22 0858 01/24/22 0936 01/24/22 2009 01/25/22 0501  NA 142 144 141 141  K 2.7* 2.8* 2.9* 2.9*  CL 109 106 112* 110  CO2 25  --  23 25  GLUCOSE 102* 102* 98 100*  BUN 24* 22 18 15   CREATININE 0.95 0.90 0.83 0.89  CALCIUM 10.3  --  9.5 9.7  MG 2.0  --   --   --   PHOS  --   --  1.7*  --    Estimated Creatinine Clearance: 54.5 mL/min (by C-G formula based on SCr of 0.89 mg/dL). Liver & Pancreas: Recent Labs  Lab 01/24/22 0858 01/24/22 2009 01/25/22 0501  AST 19  --  17  ALT 10  --  11  ALKPHOS 37*  --  34*  BILITOT 0.9  --  1.2  PROT 6.4*  --  6.0*  ALBUMIN 3.7 3.7 3.3*   No results for input(s): LIPASE, AMYLASE in the last 168 hours. No results for input(s): AMMONIA in the last 168 hours. Diabetic: No results for input(s): HGBA1C in the last 72 hours. Recent Labs  Lab 01/24/22 0909  GLUCAP 98   Cardiac Enzymes: Recent Labs  Lab 01/24/22 0858  CKTOTAL 36*   No results for input(s): PROBNP in the last 8760 hours. Coagulation Profile: No results for input(s): INR, PROTIME in the last 168 hours. Thyroid Function Tests: No results for input(s): TSH, T4TOTAL, FREET4, T3FREE, THYROIDAB in the last 72 hours. Lipid Profile: No results for input(s): CHOL, HDL, LDLCALC, TRIG, CHOLHDL, LDLDIRECT in the last 72 hours. Anemia Panel: No results for input(s): VITAMINB12,  FOLATE, FERRITIN, TIBC, IRON, RETICCTPCT in the last 72 hours. Urine analysis:    Component Value Date/Time   COLORURINE YELLOW 01/24/2022 0938   APPEARANCEUR CLEAR 01/24/2022 0938   LABSPEC 1.010 01/24/2022 0938   PHURINE 7.0 01/24/2022 0938   GLUCOSEU NEGATIVE 01/24/2022 0938   HGBUR NEGATIVE 01/24/2022 0938   BILIRUBINUR NEGATIVE 01/24/2022 0938   KETONESUR NEGATIVE 01/24/2022 0938   PROTEINUR NEGATIVE 01/24/2022 0938   UROBILINOGEN 0.2 02/20/2009 1445   NITRITE NEGATIVE 01/24/2022 0938   LEUKOCYTESUR MODERATE (A) 01/24/2022 0938   Sepsis Labs: Invalid input(s): PROCALCITONIN, LACTICIDVEN  Microbiology: Recent Results (from the past 240 hour(s))  Resp Panel by RT-PCR (Flu A&B, Covid) Nasopharyngeal Swab     Status:  None   Collection Time: 01/24/22  9:00 AM   Specimen: Nasopharyngeal Swab; Nasopharyngeal(NP) swabs in vial transport medium  Result Value Ref Range Status   SARS Coronavirus 2 by RT PCR NEGATIVE NEGATIVE Final    Comment: (NOTE) SARS-CoV-2 target nucleic acids are NOT DETECTED.  The SARS-CoV-2 RNA is generally detectable in upper respiratory specimens during the acute phase of infection. The lowest concentration of SARS-CoV-2 viral copies this assay can detect is 138 copies/mL. A negative result does not preclude SARS-Cov-2 infection and should not be used as the sole basis for treatment or other patient management decisions. A negative result may occur with  improper specimen collection/handling, submission of specimen other than nasopharyngeal swab, presence of viral mutation(s) within the areas targeted by this assay, and inadequate number of viral copies(<138 copies/mL). A negative result must be combined with clinical observations, patient history, and epidemiological information. The expected result is Negative.  Fact Sheet for Patients:  BloggerCourse.com  Fact Sheet for Healthcare Providers:   SeriousBroker.it  This test is no t yet approved or cleared by the [place] FDA and  has been authorized for detection and/or diagnosis of SARS-CoV-2 by FDA under an Emergency Use Authorization (EUA). This EUA will remain  in effect (meaning this test can be used) for the duration of the COVID-19 declaration under Section 564(b)(1) of the Act, 21 U.S.C.section 360bbb-3(b)(1), unless the authorization is terminated  or revoked sooner.       Influenza A by PCR NEGATIVE NEGATIVE Final   Influenza B by PCR NEGATIVE NEGATIVE Final    Comment: (NOTE) The Xpert Xpress SARS-CoV-2/FLU/RSV plus assay is intended as an aid in the diagnosis of influenza from Nasopharyngeal swab specimens and should not be used as a sole basis for treatment. Nasal washings and aspirates are unacceptable for Xpert Xpress SARS-CoV-2/FLU/RSV testing.  Fact Sheet for Patients: BloggerCourse.com  Fact Sheet for Healthcare Providers: SeriousBroker.it  This test is not yet approved or cleared by the [place] FDA and has been authorized for detection and/or diagnosis of SARS-CoV-2 by FDA under an Emergency Use Authorization (EUA). This EUA will remain in effect (meaning this test can be used) for the duration of the COVID-19 declaration under Section 564(b)(1) of the Act, 21 U.S.C. section 360bbb-3(b)(1), unless the authorization is terminated or revoked.  Performed at [facility], [address], [place], [place] [address]   Urine Culture     Status: Abnormal (Preliminary result)   Collection Time: 01/24/22  9:38 AM   Specimen: Urine, Clean Catch  Result Value Ref Range Status   Specimen Description   Final    URINE, CLEAN CATCH Performed at [facility], [address] [place], [place] [address]    Special Requests   Final    NONE Performed at [facility], [address], [place], [place] [address]    Culture (A)  Final    20,000 COLONIES/mL ESCHERICHIA COLI SUSCEPTIBILITIES TO FOLLOW Performed at [facility], [address] [place], [place] [address]    Report Status PENDING  Incomplete    Radiology Studies: No results found.    Brallan Denio T. Labrina Lines [facility]  If 7PM-7AM, please contact night-coverage [email or web address] 01/25/2022, 2:50 PM

## 2022-01-25 NOTE — Hospital Course (Signed)
71 year old F with PMH of dementia, diastolic CHF, HTN, HLD, anemia, breast cancer in remission, hypercalcemia and hypokalemia presenting with generalized weakness, confusion and decreased appetite for 2 days and admitted for hypokalemia and generalized weakness.  K2.7.  Otherwise, CBC and CMP without significant finding.  Urinalysis, COVID-19, influenza PCR, portable CXR and CT head without contrast basically negative.  Started on p.o. and IV potassium supplementation.  Potassium remains low.  ?

## 2022-01-26 DIAGNOSIS — R638 Other symptoms and signs concerning food and fluid intake: Secondary | ICD-10-CM | POA: Diagnosis not present

## 2022-01-26 DIAGNOSIS — F0390 Unspecified dementia without behavioral disturbance: Secondary | ICD-10-CM | POA: Diagnosis not present

## 2022-01-26 DIAGNOSIS — E876 Hypokalemia: Secondary | ICD-10-CM | POA: Diagnosis not present

## 2022-01-26 DIAGNOSIS — R531 Weakness: Secondary | ICD-10-CM | POA: Diagnosis not present

## 2022-01-26 LAB — RENAL FUNCTION PANEL
Albumin: 3.7 g/dL (ref 3.5–5.0)
Anion gap: 8 (ref 5–15)
BUN: 12 mg/dL (ref 8–23)
CO2: 23 mmol/L (ref 22–32)
Calcium: 10.2 mg/dL (ref 8.9–10.3)
Chloride: 106 mmol/L (ref 98–111)
Creatinine, Ser: 0.88 mg/dL (ref 0.44–1.00)
GFR, Estimated: >60 mL/min (ref >60)
Glucose, Bld: 122 mg/dL — ABNORMAL HIGH (ref 70–99)
Phosphorus: 2.6 mg/dL (ref 2.5–4.6)
Potassium: 3.5 mmol/L (ref 3.5–5.1)
Sodium: 137 mmol/L (ref 135–145)

## 2022-01-26 LAB — URINE CULTURE: Culture: 20000 — AB

## 2022-01-26 LAB — MAGNESIUM: Magnesium: 2.1 mg/dL (ref 1.7–2.4)

## 2022-01-26 MED ORDER — POTASSIUM CHLORIDE CRYS ER 20 MEQ PO TBCR
40.0000 meq | EXTENDED_RELEASE_TABLET | Freq: Once | ORAL | Status: AC
  Administered 2022-01-26: 40 meq via ORAL
  Filled 2022-01-26: qty 2

## 2022-01-26 NOTE — Evaluation (Signed)
Occupational Therapy Evaluation ?Patient Details ?Name: Sarah Cook ?MRN: [id number] ?DOB: 07/07/1951 ?Today's Date: 01/26/2022 ? ? ?History of Present Illness Patient is a 71 year old female who presented to the hospital with a two day history of confusion, weakness and decreased appetite. patient was found to have hypokalemia.  LGX:QJJHERDE, diastolic CHF, HTN, HLD, anemia, breast cancer in remission, hypercalcemia and hypokalemia  ? ?Clinical Impression ?  ?Patient is a pleasantly confused 71 year old female who was noted to be admitted for above. No family in room at time of assessment to verify prior level. Patient is currently, +2 hand held assistance  with continued multimodal cues to transfer from edge of bed to 3 in 1 commode and recliner. Patient continues to need cuing for self feeding strategies with patient noted to be easily distracted during session. Patient would continue to benefit from skilled OT services at this time while admitted and after d/c to address noted deficits in order to improve overall safety and independence in ADLs.  ? ?   ? ?Recommendations for follow up therapy are one component of a multi-disciplinary discharge planning process, led by the attending physician.  Recommendations may be updated based on patient status, additional functional criteria and insurance authorization.  ? ?Follow Up Recommendations ? Home health OT  ?  ?Assistance Recommended at Discharge Frequent or constant Supervision/Assistance  ?Patient can return home with the following A lot of help with walking and/or transfers;A lot of help with bathing/dressing/bathroom;Assistance with cooking/housework;Direct supervision/assist for financial management;Assist for transportation;Help with stairs or ramp for entrance;Direct supervision/assist for medications management ? ?  ?Functional Status Assessment ? Patient has had a recent decline in their functional status and demonstrates the ability to make significant  improvements in function in a reasonable and predictable amount of time.  ?Equipment Recommendations ? None recommended by OT  ?  ?Recommendations for Other Services   ? ? ?  ?Precautions / Restrictions Precautions ?Precautions: Fall ?Restrictions ?Weight Bearing Restrictions: No  ? ?  ? ?Mobility Bed Mobility ?Overal bed mobility: Needs Assistance ?Bed Mobility: Supine to Sit ?  ?  ?Supine to sit: Mod assist, HOB elevated ?  ?  ?General bed mobility comments: with increased time and physical assistn to being BLE and trunk into upright posture on edge of bed. max multimodal cues to participate in task ?  ? ?Transfers ?  ?  ?  ?  ?  ?  ?  ?  ?  ?  ?  ? ?  ?Balance Overall balance assessment: Needs assistance ?Sitting-balance support: No upper extremity supported, Feet supported ?Sitting balance-Leahy Scale: Fair ?  ?  ?Standing balance support: Reliant on assistive device for balance, Bilateral upper extremity supported, During functional activity ?Standing balance-Leahy Scale: Poor ?  ?  ?  ?  ?  ?  ?  ?  ?  ?  ?  ?  ?   ? ?ADL either performed or assessed with clinical judgement  ? ?ADL Overall ADL's : Needs assistance/impaired ?Eating/Feeding: Sitting;Moderate assistance ?Eating/Feeding Details (indicate cue type and reason): in recliner with patient easily distracted on environment. nurse made aware ?Grooming: Wash/dry face;Sitting;Minimal assistance;Wash/dry hands ?Grooming Details (indicate cue type and reason): with increased time and cues to complete task EOB ?Upper Body Bathing: Moderate assistance;Sitting ?  ?Lower Body Bathing: Sit to/from stand;Sitting/lateral leans;Maximal assistance ?  ?Upper Body Dressing : Sitting;Moderate assistance ?  ?Lower Body Dressing: Sit to/from stand;Sitting/lateral leans;Maximal assistance ?  ?Toilet Transfer: +2 for physical  assistance;+2 for safety/equipment;Minimal assistance ?Toilet Transfer Details (indicate cue type and reason): patient needed increased time to  transfer from edge of bed to recliner in room with attempts to use RW on way to 3 in 1 with poor manipuation of device and increased cues /physical assist. patient was hand held assist +2 on the wasy back to chair with physical assist provided to lad bottom into chair not on hand rest. ?Toileting- Clothing Manipulation and Hygiene: Total assistance;Sit to/from stand ?  ?  ?  ?Functional mobility during ADLs: +2 for safety/equipment;+2 for physical assistance;Minimal assistance ?   ? ? ? ?Vision   ?Additional Comments: unable to assess vision formally with patients current cog status. patient was able to read various things in room aloud like therapists shirt during session  ?   ?Perception   ?  ?Praxis   ?  ? ?Pertinent Vitals/Pain Pain Assessment ?Pain Assessment: No/denies pain  ? ? ? ?Hand Dominance Right ?  ?Extremity/Trunk Assessment Upper Extremity Assessment ?Upper Extremity Assessment: Overall WFL for tasks assessed ?  ?Lower Extremity Assessment ?Lower Extremity Assessment: Defer to PT evaluation ?  ?Cervical / Trunk Assessment ?Cervical / Trunk Assessment: Normal ?  ?Communication Communication ?Communication: No difficulties ?  ?Cognition Arousal/Alertness: Awake/alert ?Behavior During Therapy: Flat affect ?Overall Cognitive Status: History of cognitive impairments - at baseline ?  ?  ?  ?  ?  ?  ?  ?  ?  ?  ?  ?  ?  ?  ?  ?  ?General Comments: patient was oriented to self. abl to read various items in room but unable to retain the information. ?  ?  ?General Comments    ? ?  ?Exercises   ?  ?Shoulder Instructions    ? ? ?Home Living Family/patient expects to be discharged to:: Private residence ?Living Arrangements: Spouse/significant other ?Available Help at Discharge: Family;Personal care attendant ?Type of Home: House ?Home Access: Stairs to enter ?Entrance Stairs-Number of Steps: 3 and 1 ?Entrance Stairs-Rails: None ?Home Layout: One level ?  ?  ?Bathroom Shower/Tub: Tub/shower unit ?  ?Bathroom  Toilet: Standard ?Bathroom Accessibility: Yes ?  ?Home Equipment: Conservation officer, nature (2 wheels);Cane - single point;Shower seat;Wheelchair - manual ?  ?Additional Comments: info was taken from previous admissions as patient was unreliable source of PLOF ?  ? ?  ?Prior Functioning/Environment Prior Level of Function : Needs assist ?  ?  ?  ?  ?  ?  ?Mobility Comments: ambulatory with RW prior admission ?ADLs Comments: assist required with caregiver assistance for all ADLs ?  ? ?  ?  ?OT Problem List: Impaired balance (sitting and/or standing);Decreased safety awareness;Decreased knowledge of precautions;Decreased knowledge of use of DME or AE;Decreased activity tolerance ?  ?   ?OT Treatment/Interventions: Self-care/ADL training;Therapeutic exercise;Neuromuscular education;Energy conservation;DME and/or AE instruction;Therapeutic activities;Balance training;Patient/family education  ?  ?OT Goals(Current goals can be found in the care plan section) Acute Rehab OT Goals ?Patient Stated Goal: none stated ?OT Goal Formulation: Patient unable to participate in goal setting ?Time For Goal Achievement: 02/09/22 ?Potential to Achieve Goals: Good  ?OT Frequency: Min 2X/week ?  ? ?Co-evaluation PT/OT/SLP Co-Evaluation/Treatment: Yes ?Reason for Co-Treatment: To address functional/ADL transfers;Necessary to address cognition/behavior during functional activity ?PT goals addressed during session: Mobility/safety with mobility ?OT goals addressed during session: ADL's and self-care ?  ? ?  ?AM-PAC OT "6 Clicks" Daily Activity     ?Outcome Measure Help from another person eating meals?: A Lot ?Help from another  person taking care of personal grooming?: A Lot ?Help from another person toileting, which includes using toliet, bedpan, or urinal?: A Lot ?Help from another person bathing (including washing, rinsing, drying)?: A Lot ?Help from another person to put on and taking off regular upper body clothing?: A Lot ?Help from another  person to put on and taking off regular lower body clothing?: A Lot ?6 Click Score: 12 ?  ?End of Session Equipment Utilized During Treatment: Gait belt;Rolling walker (2 wheels) ?Nurse Communication: Other (comment)

## 2022-01-26 NOTE — TOC Initial Note (Addendum)
Transition of Care (TOC) - Initial/Assessment Note  ? ? ?Patient Details  ?Name: Sarah Cook ?MRN: [id number] ?Date of Birth: Jul 10, 1951 ? ?Transition of Care (TOC) CM/SW Contact:    ?Rei Medlen, Marjie Skiff, RN ?Phone Number: ?01/26/2022, 2:28 PM ? ?Clinical Narrative:                 ?Spoke with pt husband as pt is confused. Physical therapy recommendations reviewed. Husband states pt was at [facility]  recently and he would like her to go back there if they have a bed. Per Claiborne Billings at [facility] they do have a bed available. MD made aware. [place] [facility] is experiencing problems so had to manually call in request for auth. TOC will follow along. ? ?[place] received from [facility]. 3/22-3/24 [id number]. ? ?Expected Discharge Plan: [place] ?Barriers to Discharge: Continued Medical Work up ? ? ?Patient Goals and CMS Choice ?Patient states their goals for this hospitalization and ongoing recovery are:: To go home ?CMS Medicare.gov Compare Post Acute Care list provided to:: Patient Represenative (must comment) (Husband) ?Choice offered to / list presented to : Spouse ? ?Expected Discharge Plan and Services ?Expected Discharge Plan: [place] ?  ?Discharge Planning Services: CM Consult ?Post Acute Care Choice: [place] ?Living arrangements for the past 2 months: Single Family Home ?                ?  ?  ?Prior Living Arrangements/Services ?Living arrangements for the past 2 months: [place] ?Lives with:: Spouse ?Patient language and need for interpreter reviewed:: Yes ?       ?Need for Family Participation in Patient Care: Yes (Comment) ?Care giver support system in place?: Yes (comment) ?  ?Criminal Activity/Legal Involvement Pertinent to Current Situation/Hospitalization: No - Comment as needed ? ?Activities of Daily Living ?Home Assistive Devices/Equipment: None ?ADL Screening (condition at time of admission) ?Patient's cognitive ability  adequate to safely complete daily activities?: No ?Is the patient deaf or have difficulty hearing?: No ?Does the patient have difficulty seeing, even when wearing glasses/contacts?: No ?Does the patient have difficulty concentrating, remembering, or making decisions?: Yes ?Patient able to express need for assistance with ADLs?: Yes ?Does the patient have difficulty dressing or bathing?: Yes ?Independently performs ADLs?: Yes (appropriate for developmental age) ?Does the patient have difficulty walking or climbing stairs?: Yes ?Weakness of Legs: Both ?Weakness of Arms/Hands: None ? ?Permission Sought/Granted ?  ?Permission granted to share information with : Yes, Verbal Permission Granted ?   ?   ?   ?   ? ?Emotional Assessment ?  ?  ?  ?Orientation: : Oriented to Self ?Alcohol / Substance Use: Not Applicable ?Psych Involvement: No (comment) ? ?Admission diagnosis:  Hypokalemia [E87.6] ?Generalized weakness [R53.1] ?Patient Active Problem List  ? Diagnosis Date Noted  ? Generalized weakness 01/25/2022  ? Decreased oral intake 01/25/2022  ? History of pulmonary embolism 01/25/2022  ? Obesity (BMI 30-39.9) 01/25/2022  ? Malnutrition of moderate degree 01/25/2022  ? Pulmonary embolism ([place]) 11/07/2021  ? Acute encephalopathy 11/06/2021  ? Chronic diastolic CHF (congestive heart failure) ([place]) 11/06/2021  ? Anemia 11/06/2021  ? E-coli UTI 10/19/2021  ? Hypercalcemia 10/18/2021  ? Dementia without behavioral disturbance ([place]) 07/22/2021  ? Hypertensive urgency 07/21/2021  ? Refractory hypokalemia 07/20/2021  ? Generalized muscle weakness 07/20/2021  ? Encephalomalacia 07/20/2021  ? Urinary incontinence 07/20/2021  ? Hypophosphatemia 07/20/2021  ? Memory loss 06/23/2021  ? Confusion 06/23/2021  ?  Encephalopathy acute 10/18/2017  ? Post-concussion headache 10/18/2017  ? Altered mental status 10/11/2017  ? 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[place]) 03/09/2016  ? Chest pain Feb 09, 2016  ? History of antineoplastic  chemotherapy with cardiotoxic drugs 02-09-16  ? Family history of sudden cardiac death in father February 09, 2016  ? History of breast cancer   ? Hemorrhoids   ? Hiatal hernia   ? Pulmonary nodules   ? Hyperparathyroidism ([place])   ? Uncontrolled hypertension   ? Hyperlipidemia   ? Heart disease   ? Allergy   ? Mood disorder ([place])   ? TIA (transient ischemic attack)   ? Cataract   ? Osteoporosis   ? Parathyroid disorder ([place])   ? Irritable bowel syndrome 12/11/2013  ? Headache(784.0) 12/10/2013  ? ?PCP:  Cari Caraway, MD ?Pharmacy:   ?[facility] [place], [place] [address] AT [address] ?[place] ?[place] [address] ?Phone: [phone] Fax: [phone] ? ? ? ? ?Social Determinants of Health (SDOH) Interventions ?  ? ?Readmission Risk Interventions ? ?  01/26/2022  ?  2:21 PM  ?Readmission Risk Prevention Plan  ?Transportation Screening Complete  ?PCP or Specialist Appt within 3-5 Days Complete  ?[employer] or Home Care Consult Complete  ?Social Work Consult for [place] Planning/Counseling Complete  ?Palliative Care Screening Not Applicable  ?Medication Review Press photographer) Complete  ? ? ? ?

## 2022-01-26 NOTE — Evaluation (Signed)
Physical Therapy Evaluation ?Patient Details ?Name: Sarah Cook ?MRN: [id number] ?DOB: 07-30-1951 ?Today's Date: 01/26/2022 ? ?History of Present Illness ? Patient is a 71 year old female who presented to the hospital with a two day history of confusion, weakness and decreased appetite. patient was found to have hypokalemia.  CHY:IFOYDXAJ, diastolic CHF, HTN, HLD, anemia, breast cancer in remission, hypercalcemia and hypokalemia  ?Clinical Impression ?  The patient oriented to self. Patient required 2 persons mod/ assistance to mobilize from bed to [facility] then to recliner.per chart, patient ambulated with assistance PTA. Spouse not present. Patient's HR 124 during mobility. ? Patient may benefit from SNF  at DC. Pt admitted with above diagnosis.  Pt currently with functional limitations due to the deficits listed below (see PT Problem List). Pt will benefit from skilled PT to increase their independence and safety with mobility to allow discharge to the venue listed below.   ? ?   ? ?Recommendations for follow up therapy are one component of a multi-disciplinary discharge planning process, led by the attending physician.  Recommendations may be updated based on patient status, additional functional criteria and insurance authorization. ? ?Follow Up Recommendations Skilled nursing-short term rehab (<3 hours/day) ? ?  ?Assistance Recommended at Discharge    ?Patient can return home with the following ? A lot of help with walking and/or transfers;A lot of help with bathing/dressing/bathroom;Assistance with cooking/housework;Assist for transportation;Help with stairs or ramp for entrance;Assistance with feeding ? ?  ?Equipment Recommendations None recommended by PT  ?Recommendations for Other Services ?    ?  ?Functional Status Assessment Patient has had a recent decline in their functional status and demonstrates the ability to make significant improvements in function in a reasonable and predictable amount of time.  ? ?   ?Precautions / Restrictions Precautions ?Precautions: Fall  ? ?  ? ?Mobility ? Bed Mobility ?Overal bed mobility: Needs Assistance ?Bed Mobility: Supine to Sit ?  ?  ?Supine to sit: Mod assist, HOB elevated ?  ?  ?General bed mobility comments: with increased time and physical assistn to being BLE and trunk into upright posture on edge of bed. max multimodal cues to participate in task ?  ? ?Transfers ?Overall transfer level: Needs assistance ?Equipment used: Rolling walker (2 wheels) ?Transfers: Sit to/from Stand, Bed to chair/wheelchair/BSC ?Sit to Stand: +2 safety/equipment, +2 physical assistance, Mod assist ?Stand pivot transfers: Mod assist, +2 safety/equipment, +2 physical assistance ?Step pivot transfers: Mod assist, +2 safety/equipment, +2 physical assistance ?  ?  ?  ?General transfer comment: Patient transferred to [facility] using RW and multimodal cues. From [facility] to Recliner, HHA and  multimodal cues to reach for armrest and turn. ?  ? ?Ambulation/Gait ?  ?  ?  ?  ?  ?  ?  ?  ? ?Stairs ?  ?  ?  ?  ?  ? ?Wheelchair Mobility ?  ? ?Modified Rankin (Stroke Patients Only) ?  ? ?  ? ?Balance   ?Sitting-balance support: No upper extremity supported, Feet supported ?Sitting balance-Leahy Scale: Fair ?  ?  ?Standing balance support: Reliant on assistive device for balance, Bilateral upper extremity supported, During functional activity ?Standing balance-Leahy Scale: Poor ?  ?  ?  ?  ?  ?  ?  ?  ?  ?  ?  ?  ?   ? ? ? ?Pertinent Vitals/Pain Pain Assessment ?Pain Assessment: No/denies pain  ? ? ?Home Living Family/patient expects to be discharged to:: Skilled nursing facility ?  Living Arrangements: Spouse/significant other ?Available Help at Discharge: Family;Personal care attendant ?Type of Home: House ?Home Access: Stairs to enter ?Entrance Stairs-Rails: None ?Entrance Stairs-Number of Steps: 3 and 1 ?  ?Home Layout: One level ?Home Equipment: Conservation officer, nature (2 wheels);Cane - single point;Shower seat;Wheelchair -  manual ?Additional Comments: info was taken from previous admissions as patient was unreliable source of PLOF  ?  ?Prior Function Prior Level of Function : Needs assist ? Cognitive Assist : Mobility (cognitive) ?  ?  ?  ?  ?  ?Mobility Comments: ambulatory with RW prior admission ?ADLs Comments: assist required with caregiver assistance for all ADLs and ambulation ?  ? ? ?Hand Dominance  ?   ? ?  ?Extremity/Trunk Assessment  ? Upper Extremity Assessment ?Upper Extremity Assessment: Defer to OT evaluation ?  ? ?Lower Extremity Assessment ?Lower Extremity Assessment: Generalized weakness ?  ? ?Cervical / Trunk Assessment ?Cervical / Trunk Assessment: Normal  ?Communication  ? Communication: No difficulties  ?Cognition Arousal/Alertness: Awake/alert ?Behavior During Therapy: Flat affect ?Overall Cognitive Status: History of cognitive impairments - at baseline ?  ?  ?  ?  ?  ?  ?  ?  ?  ?  ?  ?  ?  ?  ?  ?  ?General Comments: patient was oriented to self. abl to read various items in room but unable to retain the information. ?  ?  ? ?  ?General Comments   ? ?  ?Exercises    ? ?Assessment/Plan  ?  ?PT Assessment Patient needs continued PT services  ?PT Problem List Decreased strength;Decreased mobility;Decreased safety awareness;Decreased knowledge of precautions;Decreased cognition;Decreased activity tolerance;Decreased balance;Decreased knowledge of use of DME ? ?   ?  ?PT Treatment Interventions DME instruction;Therapeutic activities;Cognitive remediation;Gait training;Therapeutic exercise;Patient/family education;Functional mobility training   ? ?PT Goals (Current goals can be found in the Care Plan section)  ?Acute Rehab PT Goals ?PT Goal Formulation: Patient unable to participate in goal setting ?Time For Goal Achievement: 02/09/22 ?Potential to Achieve Goals: Fair ? ?  ?Frequency Min 2X/week ?  ? ? ?Co-evaluation PT/OT/SLP Co-Evaluation/Treatment: Yes ?Reason for Co-Treatment: For patient/therapist safety ?PT  goals addressed during session: Mobility/safety with mobility ?  ?  ? ? ?  ?AM-PAC PT "6 Clicks" Mobility  ?Outcome Measure Help needed turning from your back to your side while in a flat bed without using bedrails?: A Lot ?Help needed moving from lying on your back to sitting on the side of a flat bed without using bedrails?: A Lot ?Help needed moving to and from a bed to a chair (including a wheelchair)?: Total ?Help needed standing up from a chair using your arms (e.g., wheelchair or bedside chair)?: Total ?Help needed to walk in hospital room?: Total ?Help needed climbing 3-5 steps with a railing? : Total ?6 Click Score: 8 ? ?  ?End of Session Equipment Utilized During Treatment: Gait belt ?Activity Tolerance: Patient tolerated treatment well ?Patient left: in chair;with call bell/phone within reach;with chair alarm set ?  ?PT Visit Diagnosis: Muscle weakness (generalized) (M62.81);Difficulty in walking, not elsewhere classified (R26.2) ?  ? ?Time: 4944-9675 ?PT Time Calculation (min) (ACUTE ONLY): 30 min ? ? ?Charges:   PT Evaluation ?$PT Eval Low Complexity: 1 Low ?  ?  ?   ? ? ?Tresa Endo PT ?Acute Rehabilitation Services ?Pager [phone] ?Office [phone] ? ? ?Claretha Cooper ?01/26/2022, 1:30 PM ? ?

## 2022-01-26 NOTE — Progress Notes (Signed)
?PROGRESS NOTE ? ? ? ?Sarah Cook  YBW:[id number] DOB: [birth year] DOA: 01/24/2022 ?PCP: Cari Caraway, MD  ? ?Brief Narrative:  ?71 year old F with PMH of dementia, diastolic CHF, HTN, HLD, anemia, breast cancer in remission, hypercalcemia and hypokalemia presented with generalized weakness, confusion and decreased appetite and admitted for hypokalemia and generalized weakness with potassium of 2.7.  Rest of the work-up was negative on presentation including COVID testing; chest x-ray and CT of the head without contrast were unremarkable.  She was started on p.o. and IV potassium supplementation. ? ?Assessment & Plan: ?  ?Severe hypokalemia ?-Presented with potassium of 2.7.  Treated with aggressive oral and IV supplementation. ?-Potassium 3.5 this morning.  Will replace orally.  Repeat a.m. labs. ? ?Hypertension ?-Blood pressure improving but still on the higher side.  Continue increased dose of hydralazine.  Continue irbesartan. ? ?Chronic diastolic CHF ?-Currently compensated.  Continue hydralazine and irbesartan.  Strict input and output.  Daily weights.  Fluid restriction.  Outpatient follow-up with cardiology ? ?History of pulmonary embolism ?-Continue Eliquis ? ?Dementia without behavioral disturbance ?-Fall precautions.  Continue delirium precautions.  Monitor mental status ? ?Probable anemia of chronic disease ?-From chronic illnesses.  Hemoglobin stable. ? ?Decreased oral intake ?Moderate malnutrition ?-Possibly from dementia.  Follow nutrition recommendations ? ?Physical deconditioning ?-Patient is still very weak.  PT eval pending.  Might need SNF placement. ? ?Mood disorder ?-Continue Lamictal ? ?History of breast cancer ?-Currently in remission ? ? ? ? ?DVT prophylaxis: Eliquis ?Code Status: DNR ?Family Communication: Husband on phone ?Disposition Plan: ?Status is: Inpatient ?Remains inpatient appropriate because: Of severe weakness.  Need for PT eval ? ? ? ?Consultants: None ? ?Procedures:  None ? ?Antimicrobials: None ? ? ?Subjective: ?Patient seen and examined at bedside.  Awake but confused.  No overnight fever, vomiting, seizures reported. ? ?Objective: ?Vitals:  ? 01/25/22 1430 01/25/22 2110 01/25/22 2230 01/26/22 0548  ?BP: (!) 164/82 (!) 179/85 (!) 179/94 (!) 164/89  ?Pulse: 94 (!) 105 (!) 105 93  ?Resp: '18 16  14  '$ ?Temp: 97.8 ?F (36.6 ?C) 98.8 ?F (37.1 ?C)  98.3 ?F (36.8 ?C)  ?TempSrc: Oral Oral  Oral  ?SpO2: 98% 97%  97%  ?Weight:      ?Height:      ? ? ?Intake/Output Summary (Last 24 hours) at 01/26/2022 1126 ?Last data filed at 01/26/2022 0500 ?Gross per 24 hour  ?Intake 720 ml  ?Output 1320 ml  ?Net -600 ml  ? ?Filed Weights  ? 01/24/22 1650  ?Weight: 77.2 kg  ? ? ?Examination: ? ?General exam: Appears calm and comfortable.  Elderly female lying in bed.  On room air currently.  Poor historian. ?Respiratory system: Bilateral decreased breath sounds at bases with some scattered crackles ?Cardiovascular system: S1 & S2 heard, intermittently tachycardic  ?gastrointestinal system: Abdomen is nondistended, soft and nontender. Normal bowel sounds heard. ?Extremities: No cyanosis, clubbing; trace lower extremity edema present  ?Central nervous system: Awake, slightly confused, slow to respond, poor historian.  No focal neurological deficits. Moving extremities ?Skin: No rashes, lesions or ulcers ?Psychiatry: Affect is mostly flat.  No signs of agitation. ? ? ?Data Reviewed: I have personally reviewed following labs and imaging studies ? ?CBC: ?Recent Labs  ?Lab 01/24/22 ?0858 01/24/22 ?7681 01/25/22 ?0501  ?WBC 7.5  --  6.5  ?NEUTROABS 5.8  --   --   ?HGB 12.5 11.2* 11.1*  ?HCT 36.5 33.0* 32.3*  ?MCV 91.9  --  91.5  ?PLT 242  --  213  ? ?Basic Metabolic Panel: ?Recent Labs  ?Lab 01/24/22 ?0858 01/24/22 ?6389 01/24/22 ?2009 01/25/22 ?0501 01/26/22 ?3734  ?NA 142 144 141 141 137  ?K 2.7* 2.8* 2.9* 2.9* 3.5  ?CL 109 106 112* 110 106  ?CO2 25  --  '23 25 23  '$ ?GLUCOSE 102* 102* 98 100* 122*  ?BUN 24* '22 18  15 12  '$ ?CREATININE 0.95 0.90 0.83 0.89 0.88  ?CALCIUM 10.3  --  9.5 9.7 10.2  ?MG 2.0  --   --   --  2.1  ?PHOS  --   --  1.7*  --  2.6  ? ?GFR: ?Estimated Creatinine Clearance: 55.2 mL/min (by C-G formula based on SCr of 0.88 mg/dL). ?Liver Function Tests: ?Recent Labs  ?Lab 01/24/22 ?0858 01/24/22 ?2009 01/25/22 ?0501 01/26/22 ?2876  ?AST 19  --  17  --   ?ALT 10  --  11  --   ?ALKPHOS 37*  --  34*  --   ?BILITOT 0.9  --  1.2  --   ?PROT 6.4*  --  6.0*  --   ?ALBUMIN 3.7 3.7 3.3* 3.7  ? ?No results for input(s): LIPASE, AMYLASE in the last 168 hours. ?No results for input(s): AMMONIA in the last 168 hours. ?Coagulation Profile: ?No results for input(s): INR, PROTIME in the last 168 hours. ?Cardiac Enzymes: ?Recent Labs  ?Lab 01/24/22 ?8115  ?CKTOTAL 36*  ? ?BNP (last 3 results) ?No results for input(s): PROBNP in the last 8760 hours. ?HbA1C: ?No results for input(s): HGBA1C in the last 72 hours. ?CBG: ?Recent Labs  ?Lab 01/24/22 ?0909  ?GLUCAP 98  ? ?Lipid Profile: ?No results for input(s): CHOL, HDL, LDLCALC, TRIG, CHOLHDL, LDLDIRECT in the last 72 hours. ?Thyroid Function Tests: ?No results for input(s): TSH, T4TOTAL, FREET4, T3FREE, THYROIDAB in the last 72 hours. ?Anemia Panel: ?No results for input(s): VITAMINB12, FOLATE, FERRITIN, TIBC, IRON, RETICCTPCT in the last 72 hours. ?Sepsis Labs: ?Recent Labs  ?Lab 01/24/22 ?7262  ?LATICACIDVEN 0.6  ? ? ?Recent Results (from the past 240 hour(s))  ?Resp Panel by RT-PCR (Flu A&B, Covid) Nasopharyngeal Swab     Status: None  ? Collection Time: 01/24/22  9:00 AM  ? Specimen: Nasopharyngeal Swab; Nasopharyngeal(NP) swabs in vial transport medium  ?Result Value Ref Range Status  ? SARS Coronavirus 2 by RT PCR NEGATIVE NEGATIVE Final  ?  Comment: (NOTE) ?SARS-CoV-2 target nucleic acids are NOT DETECTED. ? ?The SARS-CoV-2 RNA is generally detectable in upper respiratory ?specimens during the acute phase of infection. The lowest ?concentration of SARS-CoV-2 viral copies  this assay can detect is ?138 copies/mL. A negative result does not preclude SARS-Cov-2 ?infection and should not be used as the sole basis for treatment or ?other patient management decisions. A negative result may occur with  ?improper specimen collection/handling, submission of specimen other ?than nasopharyngeal swab, presence of viral mutation(s) within the ?areas targeted by this assay, and inadequate number of viral ?copies(<138 copies/mL). A negative result must be combined with ?clinical observations, patient history, and epidemiological ?information. The expected result is Negative. ? ?Fact Sheet for Patients:  ?EntrepreneurPulse.com.au ? ?Fact Sheet for Healthcare Providers:  ?IncredibleEmployment.be ? ?This test is no t yet approved or cleared by the [place] FDA and  ?has been authorized for detection and/or diagnosis of SARS-CoV-2 by ?FDA under an Emergency Use Authorization (EUA). This EUA will remain  ?in effect (meaning this test can be used) for the duration of the ?COVID-19 declaration under Section 564(b)(1) of  the Act, 21 ?U.S.C.section 360bbb-3(b)(1), unless the authorization is terminated  ?or revoked sooner.  ? ? ?  ? Influenza A by PCR NEGATIVE NEGATIVE Final  ? Influenza B by PCR NEGATIVE NEGATIVE Final  ?  Comment: (NOTE) ?The Xpert Xpress SARS-CoV-2/FLU/RSV plus assay is intended as an aid ?in the diagnosis of influenza from Nasopharyngeal swab specimens and ?should not be used as a sole basis for treatment. Nasal washings and ?aspirates are unacceptable for Xpert Xpress SARS-CoV-2/FLU/RSV ?testing. ? ?Fact Sheet for Patients: ?EntrepreneurPulse.com.au ? ?Fact Sheet for Healthcare Providers: ?IncredibleEmployment.be ? ?This test is not yet approved or cleared by the [place] FDA and ?has been authorized for detection and/or diagnosis of SARS-CoV-2 by ?FDA under an Emergency Use Authorization (EUA). This EUA  will remain ?in effect (meaning this test can be used) for the duration of the ?COVID-19 declaration under Section 564(b)(1) of the Act, 21 U.S.C. ?section 360bbb-3(b)(1), unless the authorization is terminated or

## 2022-01-26 NOTE — [place] FL2 (Signed)
?[place] MEDICAID FL2 LEVEL OF CARE SCREENING TOOL  ?  ? ?IDENTIFICATION  ?Patient Name: ?Sarah Cook Birthdate: 13-Feb-1951 Sex: female Admission Date (Current Location): ?01/24/2022  ?[place] and [place] Number: ? [place] ?  Facility and Address:  ?[facility],  [address], [place] ?     Provider Number: ?[id number]  ?Attending Physician Name and Address:  ?Aline August, MD ? Relative Name and Phone Number:  ?  ?   ?Current Level of Care: ?Hospital Recommended Level of Care: ?[place] Prior Approval Number: ?  ? ?Date Approved/Denied: ?  PASRR Number: ?[id number] A ? ?Discharge Plan: ?SNF ?  ? ?Current Diagnoses: ?Patient Active Problem List  ? Diagnosis Date Noted  ? Generalized weakness 01/25/2022  ? Decreased oral intake 01/25/2022  ? History of pulmonary embolism 01/25/2022  ? Obesity (BMI 30-39.9) 01/25/2022  ? Malnutrition of moderate degree 01/25/2022  ? Pulmonary embolism ([place]) 11/07/2021  ? Acute encephalopathy 11/06/2021  ? Chronic diastolic CHF (congestive heart failure) ([place]) 11/06/2021  ? Anemia 11/06/2021  ? E-coli UTI 10/19/2021  ? Hypercalcemia 10/18/2021  ? Dementia without behavioral disturbance ([place]) 07/22/2021  ? Hypertensive urgency 07/21/2021  ? Refractory hypokalemia 07/20/2021  ? Generalized muscle weakness 07/20/2021  ? Encephalomalacia 07/20/2021  ? Urinary incontinence 07/20/2021  ? Hypophosphatemia 07/20/2021  ? Memory loss 06/23/2021  ? Confusion 06/23/2021  ? Encephalopathy acute 10/18/2017  ? Post-concussion headache 10/18/2017  ? Altered mental status 10/11/2017  ? 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[place]) 03/09/2016  ? Chest pain 2016/02/16  ? History of antineoplastic chemotherapy with cardiotoxic drugs 02/16/2016  ? Family history of sudden cardiac death in father 2016/02/16  ? History of breast cancer   ? Hemorrhoids   ? Hiatal hernia   ? Pulmonary nodules   ? Hyperparathyroidism ([place])   ? Uncontrolled hypertension    ? Hyperlipidemia   ? Heart disease   ? Allergy   ? Mood disorder ([place])   ? TIA (transient ischemic attack)   ? Cataract   ? Osteoporosis   ? Parathyroid disorder ([place])   ? Irritable bowel syndrome 12/11/2013  ? Headache(784.0) 12/10/2013  ? ? ?Orientation RESPIRATION BLADDER Height & Weight   ?  ?Self ? Normal Incontinent Weight: 77.2 kg ?Height:  '5\' 1"'$  (154.9 cm)  ?BEHAVIORAL SYMPTOMS/MOOD NEUROLOGICAL BOWEL NUTRITION STATUS  ?    Incontinent Diet  ?AMBULATORY STATUS COMMUNICATION OF NEEDS Skin   ?Extensive Assist Verbally Normal ?  ?  ?  ?    ?     ?     ? ? ?Personal Care Assistance Level of Assistance  ?Bathing, Feeding, Dressing Bathing Assistance: Limited assistance ?Feeding assistance: Limited assistance ?Dressing Assistance: Limited assistance ?   ? ?Functional Limitations Info  ?Sight, Hearing Sight Info: Adequate ?Hearing Info: Adequate ?   ? ? ?SPECIAL CARE FACTORS FREQUENCY  ?PT (By licensed PT), OT (By licensed OT)   ?  ?PT Frequency: 5 x weekly ?OT Frequency: 5 x weekly ?  ?  ?  ?   ? ? ?Contractures Contractures Info: Not present  ? ? ?Additional Factors Info  ?Code Status, Allergies Code Status Info: DNR ?Allergies Info: Anesthetics,Ester, Codeine, Tape, Amlodipine, Cetirizine HCL, Hydrochlorothiazide, Hydrocodone, Onion, Latex ?  ?  ?  ?   ? ?Current Medications (01/26/2022):  This is the current hospital active medication list ?Current Facility-Administered Medications  ?Medication Dose Route Frequency Provider Last Rate Last Admin  ? acetaminophen (TYLENOL) tablet 650 mg  650 mg Oral Q6H PRN Cherylann Ratel A, DO   650 mg at 01/25/22 1940  ? Or  ? acetaminophen (TYLENOL) suppository 650 mg  650 mg Rectal Q6H PRN Marylyn Ishihara, Tyrone A, DO      ? apixaban (ELIQUIS) tablet 5 mg  5 mg Oral BID WC Kyle, Tyrone A, DO   5 mg at 01/26/22 0751  ? docusate sodium (COLACE) capsule 100 mg  100 mg Oral Q supper Marylyn Ishihara, Tyrone A, DO   100 mg at 01/25/22 1701  ? feeding supplement (ENSURE ENLIVE / ENSURE PLUS) liquid 237 mL   237 mL Oral TID WC Gonfa, Taye T, MD   237 mL at 01/26/22 1200  ? hydrALAZINE (APRESOLINE) tablet 25 mg  25 mg Oral Q6H PRN Wendee Beavers T, MD   25 mg at 01/25/22 2124  ? hydrALAZINE (APRESOLINE) tablet 50 mg  50 mg Oral TID WC Wendee Beavers T, MD   50 mg at 01/26/22 1344  ? irbesartan (AVAPRO) tablet 300 mg  300 mg Oral Q breakfast Marylyn Ishihara, Tyrone A, DO   300 mg at 01/26/22 0753  ? lamoTRIgine (LAMICTAL) tablet 100 mg  100 mg Oral BID WC Kyle, Tyrone A, DO   100 mg at 01/26/22 7824  ? magnesium oxide (MAG-OX) tablet 400 mg  400 mg Oral Q supper Cherylann Ratel A, DO   400 mg at 01/25/22 1701  ? multivitamin with minerals tablet 1 tablet  1 tablet Oral Daily Kyle, Tyrone A, DO   1 tablet at 01/26/22 2353  ? rosuvastatin (CRESTOR) tablet 10 mg  10 mg Oral Q breakfast Marylyn Ishihara, Tyrone A, DO   10 mg at 01/26/22 0800  ? ? ? ?Discharge Medications: ?Please see discharge summary for a list of discharge medications. ? ?Relevant Imaging Results: ? ?Relevant Lab Results: ? ? ?Additional Information ?SS#[id number];[employer] x2 ? ?Nachum Derossett, Marjie Skiff, RN ? ? ? ? ?

## 2022-01-27 DIAGNOSIS — Z86711 Personal history of pulmonary embolism: Secondary | ICD-10-CM | POA: Diagnosis not present

## 2022-01-27 DIAGNOSIS — R4701 Aphasia: Secondary | ICD-10-CM | POA: Diagnosis not present

## 2022-01-27 DIAGNOSIS — N3289 Other specified disorders of bladder: Secondary | ICD-10-CM | POA: Diagnosis not present

## 2022-01-27 DIAGNOSIS — R296 Repeated falls: Secondary | ICD-10-CM | POA: Diagnosis not present

## 2022-01-27 DIAGNOSIS — G44229 Chronic tension-type headache, not intractable: Secondary | ICD-10-CM | POA: Diagnosis not present

## 2022-01-27 DIAGNOSIS — H9191 Unspecified hearing loss, right ear: Secondary | ICD-10-CM | POA: Diagnosis not present

## 2022-01-27 DIAGNOSIS — F39 Unspecified mood [affective] disorder: Secondary | ICD-10-CM | POA: Diagnosis not present

## 2022-01-27 DIAGNOSIS — Z7401 Bed confinement status: Secondary | ICD-10-CM | POA: Diagnosis not present

## 2022-01-27 DIAGNOSIS — I2510 Atherosclerotic heart disease of native coronary artery without angina pectoris: Secondary | ICD-10-CM | POA: Diagnosis not present

## 2022-01-27 DIAGNOSIS — I5032 Chronic diastolic (congestive) heart failure: Secondary | ICD-10-CM | POA: Diagnosis not present

## 2022-01-27 DIAGNOSIS — R638 Other symptoms and signs concerning food and fluid intake: Secondary | ICD-10-CM | POA: Diagnosis not present

## 2022-01-27 DIAGNOSIS — Z86718 Personal history of other venous thrombosis and embolism: Secondary | ICD-10-CM | POA: Diagnosis not present

## 2022-01-27 DIAGNOSIS — Z8673 Personal history of transient ischemic attack (TIA), and cerebral infarction without residual deficits: Secondary | ICD-10-CM | POA: Diagnosis not present

## 2022-01-27 DIAGNOSIS — E876 Hypokalemia: Secondary | ICD-10-CM | POA: Diagnosis not present

## 2022-01-27 DIAGNOSIS — D638 Anemia in other chronic diseases classified elsewhere: Secondary | ICD-10-CM | POA: Diagnosis not present

## 2022-01-27 DIAGNOSIS — F411 Generalized anxiety disorder: Secondary | ICD-10-CM | POA: Diagnosis not present

## 2022-01-27 DIAGNOSIS — F0390 Unspecified dementia without behavioral disturbance: Secondary | ICD-10-CM | POA: Diagnosis not present

## 2022-01-27 DIAGNOSIS — G479 Sleep disorder, unspecified: Secondary | ICD-10-CM | POA: Diagnosis not present

## 2022-01-27 DIAGNOSIS — M6281 Muscle weakness (generalized): Secondary | ICD-10-CM | POA: Diagnosis not present

## 2022-01-27 DIAGNOSIS — Z853 Personal history of malignant neoplasm of breast: Secondary | ICD-10-CM | POA: Diagnosis not present

## 2022-01-27 DIAGNOSIS — J301 Allergic rhinitis due to pollen: Secondary | ICD-10-CM | POA: Diagnosis not present

## 2022-01-27 DIAGNOSIS — G9389 Other specified disorders of brain: Secondary | ICD-10-CM | POA: Diagnosis not present

## 2022-01-27 DIAGNOSIS — I10 Essential (primary) hypertension: Secondary | ICD-10-CM | POA: Diagnosis not present

## 2022-01-27 DIAGNOSIS — R531 Weakness: Secondary | ICD-10-CM | POA: Diagnosis not present

## 2022-01-27 DIAGNOSIS — F32A Depression, unspecified: Secondary | ICD-10-CM | POA: Diagnosis not present

## 2022-01-27 DIAGNOSIS — K582 Mixed irritable bowel syndrome: Secondary | ICD-10-CM | POA: Diagnosis not present

## 2022-01-27 DIAGNOSIS — R404 Transient alteration of awareness: Secondary | ICD-10-CM | POA: Diagnosis not present

## 2022-01-27 DIAGNOSIS — E440 Moderate protein-calorie malnutrition: Secondary | ICD-10-CM | POA: Diagnosis not present

## 2022-01-27 LAB — MAGNESIUM: Magnesium: 2.3 mg/dL (ref 1.7–2.4)

## 2022-01-27 LAB — BASIC METABOLIC PANEL
Anion gap: 7 (ref 5–15)
BUN: 16 mg/dL (ref 8–23)
CO2: 22 mmol/L (ref 22–32)
Calcium: 10.6 mg/dL — ABNORMAL HIGH (ref 8.9–10.3)
Chloride: 108 mmol/L (ref 98–111)
Creatinine, Ser: 0.91 mg/dL (ref 0.44–1.00)
GFR, Estimated: >60 mL/min (ref >60)
Glucose, Bld: 113 mg/dL — ABNORMAL HIGH (ref 70–99)
Potassium: 3.6 mmol/L (ref 3.5–5.1)
Sodium: 137 mmol/L (ref 135–145)

## 2022-01-27 MED ORDER — POTASSIUM CHLORIDE CRYS ER 20 MEQ PO TBCR
40.0000 meq | EXTENDED_RELEASE_TABLET | Freq: Once | ORAL | Status: AC
Start: 2022-01-27 — End: 2022-01-27
  Administered 2022-01-27: 40 meq via ORAL
  Filled 2022-01-27: qty 2

## 2022-01-27 MED ORDER — HYDRALAZINE HCL 50 MG PO TABS: 50.0000 mg | ORAL_TABLET | Freq: Three times a day (TID) | ORAL | 0 refills | Status: DC

## 2022-01-27 MED ORDER — POTASSIUM CHLORIDE CRYS ER 20 MEQ PO TBCR
20.0000 meq | EXTENDED_RELEASE_TABLET | Freq: Two times a day (BID) | ORAL | 0 refills | Status: DC
Start: 2022-01-27 — End: 2024-01-31

## 2022-01-27 NOTE — Discharge Summary (Signed)
Physician Discharge Summary  ?Sarah Cook YHC:[id number] DOB: Mar 07, 1951 DOA: 01/24/2022 ? ?PCP: Cari Caraway, MD ? ?Admit date: 01/24/2022 ?Discharge date: 01/27/2022 ? ?Admitted From: Home ?Disposition: SNF ? ?Recommendations for Outpatient Follow-up:  ?Follow up with SNF provider at earliest convenience with repeat BMP in the next few days ?Follow up in ED if symptoms worsen or new appear ? ? ?Home Health: No ?Equipment/Devices: None ? ?Discharge Condition: Stable ?CODE STATUS: DNR  ?diet recommendation: Heart healthy ? ?Brief/Interim Summary: ?71 year old F with PMH of dementia, diastolic CHF, HTN, HLD, anemia, breast cancer in remission, hypercalcemia and hypokalemia presented with generalized weakness, confusion and decreased appetite and admitted for hypokalemia and generalized weakness with potassium of 2.7.  Rest of the work-up was negative on presentation including COVID testing; chest x-ray and CT of the head without contrast were unremarkable.  She was started on p.o. and IV potassium supplementation.  During the hospitalization, her potassium level has improved.  She is very weak and deconditioned and PT recommended SNF placement.  She will be discharged to SNF once bed is available. ? ?Discharge Diagnoses:  ? ?Severe hypokalemia ?-Presented with potassium of 2.7.  Treated with aggressive oral and IV supplementation. ?-Potassium 3.6 this morning.  Will replace orally.  Continue outpatient replacement.  Follow-up labs at [facility]  ?-Discharge patient to SNF today ? ?hypertension ?-Blood pressure improving but still on the higher side.  Continue increased dose of hydralazine.  Continue ARB. ? ?Chronic diastolic CHF ?-Currently compensated.  Continue hydralazine and ARB.  Continue diet and fluid restriction.  Outpatient follow-up with cardiology ?  ?History of pulmonary embolism ?-Continue Eliquis ?  ?Dementia without behavioral disturbance ?-Continue delirium precautions.  Fall precautions.  Outpatient  follow-up. ? ?Probable anemia of chronic disease ?-From chronic illnesses.  Hemoglobin stable. ? ?Decreased oral intake ?Moderate malnutrition ?-Possibly from dementia.  Follow nutrition recommendations ?  ?Physical deconditioning ?-Patient is still very weak.  Will need PT at SNF. ? ?Mood disorder ?-Continue Lamictal ? ?History of breast cancer ?-Currently in remission ?  ? ?Discharge Instructions ? ? ?Allergies as of 01/27/2022   ? ?   Reactions  ? Anesthetics, Ester Nausea And Vomiting  ? Codeine Nausea Only  ? Tape Other (See Comments)  ? Skin blisters  ? Amlodipine Swelling  ? Cetirizine Hcl   ? Other reaction(s): altered consciousness  ? Hydrochlorothiazide Other (See Comments)  ? Unknown reaction  ? Hydrocodone Nausea And Vomiting  ? Onion Other (See Comments)  ? Does not like  ? Latex Itching, Rash  ? Irritates skin  ? ?  ? ?  ?Medication List  ?  ? ?TAKE these medications   ? ?apixaban 5 MG Tabs tablet ?Commonly known as: ELIQUIS ?Take 1 tablet (5 mg total) by mouth 2 (two) times daily. ?What changed: when to take this ?  ?Co Q 10 100 MG Caps ?Take 100 mg by mouth daily with supper. ?  ?Digestive Enzyme Caps ?Take 1 capsule by mouth daily with breakfast. ?  ?docusate sodium 100 MG capsule ?Commonly known as: COLACE ?Take 100 mg by mouth daily with supper. ?  ?feeding supplement Liqd ?Take 237 mLs by mouth 2 (two) times daily between meals. ?What changed: when to take this ?  ?hydrALAZINE 50 MG tablet ?Commonly known as: APRESOLINE ?Take 1 tablet (50 mg total) by mouth 3 (three) times daily with meals. ?What changed:  ?medication strength ?how much to take ?  ?irbesartan 300 MG tablet ?Commonly known as: AVAPRO ?Take 300  mg by mouth daily with breakfast. ?  ?lamoTRIgine 100 MG tablet ?Commonly known as: LaMICtal ?Take 1 tablet (100 mg total) by mouth 2 (two) times daily. ?What changed: when to take this ?  ?magnesium oxide 400 MG tablet ?Commonly known as: MAG-OX ?Take 400 mg by mouth daily with supper. ?   ?multivitamin with minerals Tabs tablet ?Take 1 tablet by mouth daily. ?What changed: when to take this ?  ?potassium chloride SA 20 MEQ tablet ?Commonly known as: KLOR-CON M ?Take 1 tablet (20 mEq total) by mouth 2 (two) times daily for 15 days. ?What changed: when to take this ?  ?PROBIOTIC PO ?Take 1 capsule by mouth daily with supper. ?  ?rosuvastatin 10 MG tablet ?Commonly known as: CRESTOR ?Take 10 mg by mouth daily with breakfast. ?  ?TURMERIC CURCUMIN PO ?Take 1,000 mg by mouth daily with supper. ?  ?vitamin C 1000 MG tablet ?Take 1,000 mg by mouth daily with lunch. ?  ?Vitamin D3 50 MCG (2000 UT) Tabs ?Take 2,000 Units by mouth daily with lunch. ?  ? ?  ? ? ? ?Allergies  ?Allergen Reactions  ? Anesthetics, Ester Nausea And Vomiting  ? Codeine Nausea Only  ? Tape Other (See Comments)  ?  Skin blisters  ? Amlodipine Swelling  ? Cetirizine Hcl   ?  Other reaction(s): altered consciousness  ? Hydrochlorothiazide Other (See Comments)  ?  Unknown reaction  ? Hydrocodone Nausea And Vomiting  ? Onion Other (See Comments)  ?  Does not like  ? Latex Itching and Rash  ?  Irritates skin  ? ? ?Consultations: ?None ? ? ?Procedures/Studies: ?CT HEAD WO CONTRAST ? ?Result Date: 01/24/2022 ?CLINICAL DATA:  Altered mental status EXAM: CT HEAD WITHOUT CONTRAST TECHNIQUE: Contiguous axial images were obtained from the base of the skull through the vertex without intravenous contrast. RADIATION DOSE REDUCTION: This exam was performed according to the departmental dose-optimization program which includes automated exposure control, adjustment of the mA and/or kV according to patient size and/or use of iterative reconstruction technique. COMPARISON:  11/06/2021 FINDINGS: Brain: No acute intracranial findings are seen in noncontrast CT brain. There is decreased density in the periventricular and subcortical white matter. Cortical sulci are prominent. Vascular: Unremarkable. Skull: Unremarkable. Sinuses/Orbits: Unremarkable. Other:  No significant interval changes are noted IMPRESSION: No acute intracranial findings are seen. Atrophy. Small-vessel disease. Electronically Signed   By: Elmer Picker M.D.   On: 01/24/2022 10:08  ? ?DG Chest Port 1 View ? ?Result Date: 01/24/2022 ?CLINICAL DATA:  71 year old female with weakness EXAM: PORTABLE CHEST 1 VIEW COMPARISON:  10/18/2021 FINDINGS: Cardiomediastinal silhouette unchanged in size and contour. No evidence of central vascular congestion. No interlobular septal thickening. Double density projects over the lower mediastinum. Low lung volumes. No pneumothorax or pleural effusion. Coarsened interstitial markings, with no confluent airspace disease. No acute displaced fracture. IMPRESSION: Negative for acute cardiopulmonary disease. Hiatal hernia Electronically Signed   By: Corrie Mckusick D.O.   On: 01/24/2022 09:20   ? ? ? ?Subjective: ?Patient seen and examined at bedside.  Awake but confused and a poor historian.  No agitation, fever, vomiting reported. ? ?Discharge Exam: ?Vitals:  ? 01/27/22 0155 01/27/22 0542  ?BP: (!) 176/83 (!) 157/84  ?Pulse: (!) 108 90  ?Resp:  18  ?Temp: 98.1 ?F (36.7 ?C) 97.9 ?F (36.6 ?C)  ?SpO2: 96% 91%  ? ? ?General: Alert but confused.  On room air.  Poor historian  ?cardiovascular: S1-S2 heard; currently rate controlled  ?  respiratory: bilateral decreased breath sounds at bases, no wheezing ?Abdominal: Soft, NT, ND, bowel sounds + ?Extremities: No clubbing; trace lower extremity edema present ? ? ?The results of significant diagnostics from this hospitalization (including imaging, microbiology, ancillary and laboratory) are listed below for reference.   ? ? ?Microbiology: ?Recent Results (from the past 240 hour(s))  ?Resp Panel by RT-PCR (Flu A&B, Covid) Nasopharyngeal Swab     Status: None  ? Collection Time: 01/24/22  9:00 AM  ? Specimen: Nasopharyngeal Swab; Nasopharyngeal(NP) swabs in vial transport medium  ?Result Value Ref Range Status  ? SARS Coronavirus 2  by RT PCR NEGATIVE NEGATIVE Final  ?  Comment: (NOTE) ?SARS-CoV-2 target nucleic acids are NOT DETECTED. ? ?The SARS-CoV-2 RNA is generally detectable in upper respiratory ?specimens during the acute phase of i

## 2022-01-27 NOTE — TOC Transition Note (Signed)
Transition of Care (TOC) - CM/SW Discharge Note ? ? ?Patient Details  ?Name: Sarah Cook ?MRN: [id number] ?Date of Birth: Oct 04, 1951 ? ?Transition of Care (TOC) CM/SW Contact:  ?Nashawn Hillock, Marjie Skiff, RN ?Phone Number: ?01/27/2022, 10:25 AM ? ? ?Clinical Narrative:    ?Pt to dc to [facility]. She will be transported via [facility]. Yellow DNR on chart for transport. RN to call report to [phone] ? ?Final next level of care: [place] ?Barriers to Discharge: Continued Medical Work up ? ? ?Patient Goals and CMS Choice ?Patient states their goals for this hospitalization and ongoing recovery are:: To go home ?CMS Medicare.gov Compare Post Acute Care list provided to:: Patient Represenative (must comment) (Husband) ?Choice offered to / list presented to : Spouse ? ?Discharge Placement ?  ?           ?Patient [facility] bed at: [facility] ?Patient to be transferred to facility by: [facility] ?Name of family member notified: Husband Mr. Beulah Gandy ?Patient and family notified of of transfer: 01/27/22 ? ?Discharge Plan and Services ?  ?Discharge Planning Services: CM Consult ?Post Acute Care Choice: [facility]          ?  ?  ?Readmission Risk Interventions ? ?  01/26/2022  ?  2:21 PM  ?Readmission Risk Prevention Plan  ?Transportation Screening Complete  ?PCP or Specialist Appt within 3-5 Days Complete  ?[place] or Home Care Consult Complete  ?Social Work Consult for [place] Planning/Counseling Complete  ?Palliative Care Screening Not Applicable  ?Medication Review Press photographer) Complete  ? ? ? ? ? ?

## 2022-02-01 DIAGNOSIS — J301 Allergic rhinitis due to pollen: Secondary | ICD-10-CM | POA: Diagnosis not present

## 2022-02-01 DIAGNOSIS — F411 Generalized anxiety disorder: Secondary | ICD-10-CM | POA: Diagnosis not present

## 2022-02-01 DIAGNOSIS — E440 Moderate protein-calorie malnutrition: Secondary | ICD-10-CM | POA: Diagnosis not present

## 2022-02-01 DIAGNOSIS — D638 Anemia in other chronic diseases classified elsewhere: Secondary | ICD-10-CM | POA: Diagnosis not present

## 2022-02-01 DIAGNOSIS — G44229 Chronic tension-type headache, not intractable: Secondary | ICD-10-CM | POA: Diagnosis not present

## 2022-02-01 DIAGNOSIS — I5032 Chronic diastolic (congestive) heart failure: Secondary | ICD-10-CM | POA: Diagnosis not present

## 2022-02-01 DIAGNOSIS — E876 Hypokalemia: Secondary | ICD-10-CM | POA: Diagnosis not present

## 2022-02-01 DIAGNOSIS — I2510 Atherosclerotic heart disease of native coronary artery without angina pectoris: Secondary | ICD-10-CM | POA: Diagnosis not present

## 2022-02-01 DIAGNOSIS — R4701 Aphasia: Secondary | ICD-10-CM | POA: Diagnosis not present

## 2022-02-03 DIAGNOSIS — D638 Anemia in other chronic diseases classified elsewhere: Secondary | ICD-10-CM | POA: Diagnosis not present

## 2022-02-03 DIAGNOSIS — I2510 Atherosclerotic heart disease of native coronary artery without angina pectoris: Secondary | ICD-10-CM | POA: Diagnosis not present

## 2022-02-03 DIAGNOSIS — E876 Hypokalemia: Secondary | ICD-10-CM | POA: Diagnosis not present

## 2022-02-03 DIAGNOSIS — G44229 Chronic tension-type headache, not intractable: Secondary | ICD-10-CM | POA: Diagnosis not present

## 2022-02-03 DIAGNOSIS — F411 Generalized anxiety disorder: Secondary | ICD-10-CM | POA: Diagnosis not present

## 2022-02-03 DIAGNOSIS — R4701 Aphasia: Secondary | ICD-10-CM | POA: Diagnosis not present

## 2022-02-03 DIAGNOSIS — J301 Allergic rhinitis due to pollen: Secondary | ICD-10-CM | POA: Diagnosis not present

## 2022-02-03 DIAGNOSIS — I5032 Chronic diastolic (congestive) heart failure: Secondary | ICD-10-CM | POA: Diagnosis not present

## 2022-02-03 DIAGNOSIS — E440 Moderate protein-calorie malnutrition: Secondary | ICD-10-CM | POA: Diagnosis not present

## 2022-02-06 LAB — ALDOSTERONE + RENIN ACTIVITY W/ RATIO
ALDO / PRA Ratio: UNDETERMINED
Aldosterone: <1 ng/dL (ref 0.0–30.0)
PRA LC/MS/MS: <0.167 ng/mL/hr — ABNORMAL LOW (ref 0.167–5.380)

## 2022-02-09 DIAGNOSIS — F0390 Unspecified dementia without behavioral disturbance: Secondary | ICD-10-CM | POA: Diagnosis not present

## 2022-02-09 DIAGNOSIS — F39 Unspecified mood [affective] disorder: Secondary | ICD-10-CM | POA: Diagnosis not present

## 2022-02-09 DIAGNOSIS — Z8673 Personal history of transient ischemic attack (TIA), and cerebral infarction without residual deficits: Secondary | ICD-10-CM | POA: Diagnosis not present

## 2022-02-09 DIAGNOSIS — R296 Repeated falls: Secondary | ICD-10-CM | POA: Diagnosis not present

## 2022-02-09 DIAGNOSIS — G479 Sleep disorder, unspecified: Secondary | ICD-10-CM | POA: Diagnosis not present

## 2022-02-09 DIAGNOSIS — H9191 Unspecified hearing loss, right ear: Secondary | ICD-10-CM | POA: Diagnosis not present

## 2022-02-09 DIAGNOSIS — I10 Essential (primary) hypertension: Secondary | ICD-10-CM | POA: Diagnosis not present

## 2022-02-09 DIAGNOSIS — Z86718 Personal history of other venous thrombosis and embolism: Secondary | ICD-10-CM | POA: Diagnosis not present

## 2022-02-09 DIAGNOSIS — G9389 Other specified disorders of brain: Secondary | ICD-10-CM | POA: Diagnosis not present

## 2022-02-11 DIAGNOSIS — F0390 Unspecified dementia without behavioral disturbance: Secondary | ICD-10-CM | POA: Diagnosis not present

## 2022-02-18 ENCOUNTER — Encounter (HOSPITAL_COMMUNITY): Payer: Self-pay | Admitting: Emergency Medicine

## 2022-02-18 ENCOUNTER — Emergency Department (HOSPITAL_COMMUNITY): Payer: Medicare PPO

## 2022-02-18 ENCOUNTER — Other Ambulatory Visit: Payer: Self-pay

## 2022-02-18 ENCOUNTER — Emergency Department (HOSPITAL_COMMUNITY)
Admission: EM | Admit: 2022-02-18 | Discharge: 2022-02-19 | Disposition: A | Discharge status: Home / Self Care | Payer: Medicare PPO | Attending: Emergency Medicine | Admitting: Emergency Medicine

## 2022-02-18 DIAGNOSIS — Z7901 Long term (current) use of anticoagulants: Secondary | ICD-10-CM | POA: Diagnosis not present

## 2022-02-18 DIAGNOSIS — Y9301 Activity, walking, marching and hiking: Secondary | ICD-10-CM | POA: Insufficient documentation

## 2022-02-18 DIAGNOSIS — S6992XA Unspecified injury of left wrist, hand and finger(s), initial encounter: Secondary | ICD-10-CM | POA: Diagnosis present

## 2022-02-18 DIAGNOSIS — W19XXXA Unspecified fall, initial encounter: Secondary | ICD-10-CM

## 2022-02-18 DIAGNOSIS — M25519 Pain in unspecified shoulder: Secondary | ICD-10-CM | POA: Diagnosis not present

## 2022-02-18 DIAGNOSIS — S42202A Unspecified fracture of upper end of left humerus, initial encounter for closed fracture: Secondary | ICD-10-CM | POA: Diagnosis not present

## 2022-02-18 DIAGNOSIS — S42402A Unspecified fracture of lower end of left humerus, initial encounter for closed fracture: Secondary | ICD-10-CM | POA: Insufficient documentation

## 2022-02-18 DIAGNOSIS — S0990XA Unspecified injury of head, initial encounter: Secondary | ICD-10-CM | POA: Diagnosis not present

## 2022-02-18 DIAGNOSIS — S42415A Nondisplaced simple supracondylar fracture without intercondylar fracture of left humerus, initial encounter for closed fracture: Secondary | ICD-10-CM | POA: Diagnosis not present

## 2022-02-18 DIAGNOSIS — S0083XA Contusion of other part of head, initial encounter: Secondary | ICD-10-CM | POA: Diagnosis not present

## 2022-02-18 DIAGNOSIS — W01198A Fall on same level from slipping, tripping and stumbling with subsequent striking against other object, initial encounter: Secondary | ICD-10-CM | POA: Insufficient documentation

## 2022-02-18 DIAGNOSIS — Z043 Encounter for examination and observation following other accident: Secondary | ICD-10-CM | POA: Diagnosis not present

## 2022-02-18 DIAGNOSIS — R9431 Abnormal electrocardiogram [ECG] [EKG]: Secondary | ICD-10-CM | POA: Diagnosis not present

## 2022-02-18 LAB — CBC
HCT: 29 % — ABNORMAL LOW (ref 36.0–46.0)
Hemoglobin: 9.6 g/dL — ABNORMAL LOW (ref 12.0–15.0)
MCH: 31.1 pg (ref 26.0–34.0)
MCHC: 33.1 g/dL (ref 30.0–36.0)
MCV: 93.9 fL (ref 80.0–100.0)
Platelets: 259 10*3/uL (ref 150–400)
RBC: 3.09 MIL/uL — ABNORMAL LOW (ref 3.87–5.11)
RDW: 13.2 % (ref 11.5–15.5)
WBC: 10 10*3/uL (ref 4.0–10.5)
nRBC: 0 % (ref 0.0–0.2)

## 2022-02-18 MED ORDER — OXYCODONE-ACETAMINOPHEN 5-325 MG PO TABS
1.0000 | ORAL_TABLET | Freq: Once | ORAL | Status: AC
  Administered 2022-02-18: 1 via ORAL
  Filled 2022-02-18: qty 1

## 2022-02-18 MED ORDER — LACTATED RINGERS IV BOLUS
1000.0000 mL | Freq: Once | INTRAVENOUS | Status: AC
  Administered 2022-02-18: 1000 mL via INTRAVENOUS

## 2022-02-18 NOTE — Discharge Instructions (Addendum)
Please follow-up with Dr. Mable Fill regarding the orthopedic fracture.  You may also follow-up with Dr. Levell July office if you would prefer -Dr. Fredna Dow performed the surgery back in 2012. ? ?Please keep your arm in the splint until you have followed up with orthopedic surgery.  They will provide further instructions at time of visit. ? ?Her hemoglobin was mildly low today.  Please have her primary care doctor recheck this in the next few days.   ?

## 2022-02-18 NOTE — ED Notes (Signed)
Ortho notified. ?

## 2022-02-18 NOTE — ED Triage Notes (Signed)
Pt transported from [facility] after witnessed fall while walking down hall. Pt c/o pain to L elbow, back of head, redness around L eye, GCS 14-baseline, pt takes Eliquis. VSS. R sided restricted. Ccollar in place.  ?

## 2022-02-18 NOTE — Progress Notes (Signed)
Orthopedic Tech Progress Note ?Patient Details:  ?Sarah Cook ?15-Dec-1950 ?[id number] ?Level 2 Trauma  ?Patient ID: Sarah Cook, female   DOB: 06-22-1951, 71 y.o.   MRN: [id number] ? ?Fadil Macmaster E Lovell Roe ?02/18/2022, 7:27 PM ? ?

## 2022-02-18 NOTE — ED Provider Notes (Signed)
?[facility] ?Provider Note ? ? ?CSN: [id number] ?Arrival date & time: 02/18/22  1845 ? ?  ? ?History ? ?Chief Complaint  ?Patient presents with  ? Fall  ? ? ?Sarah Cook is a 71 y.o. female.  Presents to ER with concern for witnessed fall.  Elderly patient on Eliquis, from memory care facility.  Per EMS report, patient had tripped and denied losing consciousness.  Apparently she was walking down the hallway at the facility.  She did endorse hitting her head.  Patient now complaining of left shoulder and left elbow pain.  Patient states pain is mild to moderate.  She is able to answer some basic questions but struggles with complex questioning.  At baseline per report. ? ?HPI ? ?  ? ?Home Medications ?Prior to Admission medications   ?Medication Sig Start Date End Date Taking? Authorizing Provider  ?apixaban (ELIQUIS) 5 MG TABS tablet Take 1 tablet (5 mg total) by mouth 2 (two) times daily. ?Patient taking differently: Take 5 mg by mouth 2 (two) times daily with a meal. 12/07/21   Mercy Riding, MD  ?Ascorbic Acid (VITAMIN C) 1000 MG tablet Take 1,000 mg by mouth daily with lunch.    [provider]  ?Cholecalciferol (VITAMIN D3) 2000 units TABS Take 2,000 Units by mouth daily with lunch.    [provider]  ?Coenzyme Q10 (CO Q 10) 100 MG CAPS Take 100 mg by mouth daily with supper.    [provider]  ?Digestive Enzyme CAPS Take 1 capsule by mouth daily with breakfast.    [provider]  ?docusate sodium (COLACE) 100 MG capsule Take 100 mg by mouth daily with supper.    [provider]  ?feeding supplement (ENSURE ENLIVE / ENSURE PLUS) LIQD Take 237 mLs by mouth 2 (two) times daily between meals. ?Patient taking differently: Take 237 mLs by mouth daily with breakfast. 10/21/21   Pokhrel, Corrie Mckusick, MD  ?hydrALAZINE (APRESOLINE) 50 MG tablet Take 1 tablet (50 mg total) by mouth 3 (three) times daily with meals. 01/27/22   Aline August, MD  ?irbesartan (AVAPRO) 300 MG tablet Take 300 mg by mouth daily with breakfast. 06/12/17   [provider]  ?lamoTRIgine (LAMICTAL) 100 MG tablet Take 1 tablet (100 mg total) by mouth 2 (two) times daily. ?Patient taking differently: Take 100 mg by mouth 2 (two) times daily with a meal. 06/23/21   Marcial Pacas, MD  ?magnesium oxide (MAG-OX) 400 MG tablet Take 400 mg by mouth daily with supper.    [provider]  ?potassium chloride SA (KLOR-CON M) 20 MEQ tablet Take 1 tablet (20 mEq total) by mouth 2 (two) times daily for 15 days. 01/27/22 02/11/22  Aline August, MD  ?Probiotic Product (PROBIOTIC PO) Take 1 capsule by mouth daily with supper.    [provider]  ?rosuvastatin (CRESTOR) 10 MG tablet Take 10 mg by mouth daily with breakfast. 06/18/21   [provider]  ?TURMERIC CURCUMIN PO Take 1,000 mg by mouth daily with supper.    [provider]  ?   ? ?Allergies    ?Anesthetics, ester; Codeine; Tape; Amlodipine; Cetirizine hcl; Hydrochlorothiazide; Hydrocodone; Onion; and Latex   ? ?Review of Systems   ?Review of Systems  ?Unable to perform ROS: Dementia  ? ?Physical Exam ?Updated Vital Signs ?BP (!) 110/56   Pulse 92   Temp 98.6 ?F (37 ?C) (Temporal)   Resp 16   Ht '5\' 1"'$  (1.549  m)   Wt 77.1 kg   SpO2 97%   BMI 32.12 kg/m?  ?Physical Exam ?Vitals and nursing note reviewed.  ?Constitutional:   ?   General: She is not in acute distress. ?   Appearance: She is well-developed.  ?HENT:  ?   Head: Normocephalic.  ?   Comments: Mild ecchymosis over left face, eyes appear normal ?Eyes:  ?   Conjunctiva/sclera: Conjunctivae normal.  ?Cardiovascular:  ?   Rate and Rhythm: Normal rate and regular rhythm.  ?   Heart sounds: No murmur heard. ?Pulmonary:  ?   Effort: Pulmonary effort is normal. No respiratory distress.  ?   Breath sounds: Normal breath sounds.  ?Abdominal:  ?   Palpations: Abdomen is soft.  ?   Tenderness: There is no abdominal tenderness.   ?Musculoskeletal:     ?   General: No swelling.  ?   Cervical back: Neck supple.  ?   Comments: Back: no C, T, L spine TTP, no step off or deformity ?RUE: no TTP throughout, no deformity, normal joint ROM, radial pulse intact, distal sensation and motor intact ?LUE: no TTP throughout, no deformity, normal joint ROM, radial pulse intact, distal sensation and motor intact ?RLE:  no TTP throughout, no deformity, normal joint ROM, distal pulse, sensation and motor intact ?LLE: no TTP throughout, no deformity, normal joint ROM, distal pulse, sensation and motor intact  ?Skin: ?   General: Skin is warm and dry.  ?   Capillary Refill: Capillary refill takes less than 2 seconds.  ?Neurological:  ?   Mental Status: She is alert.  ?Psychiatric:     ?   Mood and Affect: Mood normal.  ? ? ?ED Results / Procedures / Treatments   ?Labs ?(all labs ordered are listed, but only abnormal results are displayed) ?Labs Reviewed  ?CBC - Abnormal; Notable for the following components:  ?    Result Value  ? RBC 3.09 (*)   ? Hemoglobin 9.6 (*)   ? HCT 29.0 (*)   ? All other components within normal limits  ?BASIC METABOLIC PANEL - Abnormal; Notable for the following components:  ? Glucose, Bld 117 (*)   ? All other components within normal limits  ?URINALYSIS, ROUTINE W REFLEX MICROSCOPIC  ?POC OCCULT BLOOD, ED  ? ? ?EKG ?EKG Interpretation ? ?Date/Time:  Friday February 18 2022 23:06:18 EDT ?Ventricular Rate:  96 ?PR Interval:  142 ?QRS Duration: 90 ?QT Interval:  339 ?QTC Calculation: 429 ?R Axis:   49 ?Text Interpretation: Sinus rhythm no acute STEMI Confirmed by Madalyn Rob [phone]) on 02/19/2022 12:17:16 AM ? ?Radiology ?DG Chest 1 View ? ?Result Date: 02/18/2022 ?CLINICAL DATA:  Status post fall. EXAM: CHEST  1 VIEW COMPARISON:  January 24, 2022 FINDINGS: Decreased lung volumes are seen with mild linear atelectasis noted within the left lung base. There is no evidence of acute infiltrate, pleural effusion or pneumothorax. The heart size  and mediastinal contours are within normal limits. A hiatal hernia is suspected. Radiopaque surgical clips are seen within the right upper quadrant. The visualized skeletal structures are unremarkable. IMPRESSION: Very mild left basilar linear atelectasis. Electronically Signed   By: Virgina Norfolk M.D.   On: 02/18/2022 19:20  ? ?DG Pelvis 1-2 Views ? ?Result Date: 02/18/2022 ?CLINICAL DATA:  Status post fall. EXAM: PELVIS - 1-2 VIEW COMPARISON:  None. FINDINGS: A cortical deformity of indeterminate age is seen involving the left inferior pubic ramus. Soft tissue structures are unremarkable. IMPRESSION: Cortical  deformity of the left inferior pubic ramus, which may represent a nondisplaced fracture of indeterminate age. Electronically Signed   By: Virgina Norfolk M.D.   On: 02/18/2022 19:40  ? ?DG Elbow Complete Left ? ?Result Date: 02/18/2022 ?CLINICAL DATA:  Status post fall. EXAM: LEFT ELBOW - COMPLETE 3+ VIEW COMPARISON:  None. FINDINGS: A nondisplaced fracture deformity is seen extending through the supracondylar region of the distal left humerus. A radiopaque prosthesis is seen along the left radial head, with a small radiopaque surgical screw noted within the lateral epicondyle of the distal left humerus. There is no evidence of dislocation. Soft tissues are unremarkable. IMPRESSION: Acute nondisplaced fracture of the distal left humerus. Electronically Signed   By: Virgina Norfolk M.D.   On: 02/18/2022 19:38  ? ?CT Head Wo Contrast ? ?Result Date: 02/18/2022 ?CLINICAL DATA:  Status post fall. EXAM: CT HEAD WITHOUT CONTRAST TECHNIQUE: Contiguous axial images were obtained from the base of the skull through the vertex without intravenous contrast. RADIATION DOSE REDUCTION: This exam was performed according to the departmental dose-optimization program which includes automated exposure control, adjustment of the mA and/or kV according to patient size and/or use of iterative reconstruction technique.  COMPARISON:  January 24, 2022 FINDINGS: Brain: There is mild cerebral atrophy with widening of the extra-axial spaces and ventricular dilatation. There are areas of decreased attenuation within the white matter tracts of the supratento

## 2022-02-18 NOTE — ED Notes (Signed)
Trauma Response Nurse Documentation ? ? ?Sarah Cook is a 71 y.o. female [facility] ? ?On Eliquis (apixaban) daily. Trauma was activated as a Level 2 by ED Charge RN based on the following trauma criteria Elderly patients > 65 with head trauma on anti-coagulation (excluding ASA). Trauma RN at the bedside on patient arrival. Patient cleared for CT by Dr. Roslynn Amble. Patient to CT with team. GCS 14 (baseline). ? ?History  ? Past Medical History:  ?Diagnosis Date  ? Allergy   ? Anxiety   ? Breast cancer ([place])   ? Cataract   ? early signs of  ? Gait abnormality   ? Heart disease   ? Hemorrhoids   ? Hiatal hernia 11/07/2012  ? large  ? Hyperlipidemia   ? Hyperparathyroidism ([place])   ? Hypertension   ? Memory loss   ? Osteoporosis   ? Parathyroid disorder ([place])   ? Pulmonary nodules   ? TIA (transient ischemic attack)   ? Urinary incontinence   ?  ? Past Surgical History:  ?Procedure Laterality Date  ? BLADDER SUSPENSION    ? CESAREAN SECTION    ? x 2  ? CHOLECYSTECTOMY    ? MASTECTOMY Right   ? PARATHYROIDECTOMY    ? TOTAL ELBOW REPLACEMENT    ? TUBAL LIGATION    ?  ? ? ?Initial Focused Assessment (If applicable, or please see trauma documentation): ?- GCS 14 which is her baseline (pt from memory care facility) ?- PERRLA 3's brisk ?- c/o pain to back of head and to L elbow ?- abrasion/bruising around left eye ?- restricted extremity band to R arm (previous breast cancer;unsure when) ?- C-collar in place ? ?CT's Completed:   ?CT Head and CT C-Spine  ? ?Interventions:  ?- CXR ?- Pelvic XR ?- CT head and neck ? ?Plan for disposition:  ?Other unk at this time - awaiting scan results ? ?Consults completed:  ?none at 20. ? ?Event Summary: ?- Pt was walking down hall at facility when she had a witnessed fall.  She did hit her head and is on eliquis.  No LOC.   ? ?MTP Summary (If applicable): n/a ? ?Bedside handoff with ED RN Autumn.   ? ?Dulcy Fanny W  ?Trauma Response RN ? ?Please call TRN at  [phone] for further assistance. ? ? ?

## 2022-02-18 NOTE — Progress Notes (Signed)
Orthopedic Tech Progress Note ?Patient Details:  ?Sarah Cook ?[birth year] ?[id number] ? ?Ortho Devices ?Type of Ortho Device: Long arm splint ?Ortho Device/Splint Location: lue ?Ortho Device/Splint Interventions: Ordered, Application, Adjustment ? Pt was moving during the application. ?Post Interventions ?Patient Tolerated: Well ? ?Edwina Barth ?02/18/2022, 9:19 PM ? ?

## 2022-02-18 NOTE — Progress Notes (Signed)
?   02/18/22 1840  ?Clinical Encounter Type  ?Visited With Patient not available  ?Visit Type Initial;Trauma  ?Referral From Nurse  ?Consult/Referral To Chaplain  ? ?Chaplain responded to a level two trauma, patient was under care of the medical team.  ?No family was present. Advised nurse that if a chaplain was requested I will return.  ? ?Danice Goltz ?Chaplain  ?[facility]  ?[phone] ?

## 2022-02-18 NOTE — ED Notes (Signed)
IV side started on restricted arm without using tourniquet. 22G on RFA.  ?

## 2022-02-19 DIAGNOSIS — Z7401 Bed confinement status: Secondary | ICD-10-CM | POA: Diagnosis not present

## 2022-02-19 DIAGNOSIS — I959 Hypotension, unspecified: Secondary | ICD-10-CM | POA: Diagnosis not present

## 2022-02-19 LAB — BASIC METABOLIC PANEL
Anion gap: 5 (ref 5–15)
BUN: 18 mg/dL (ref 8–23)
CO2: 24 mmol/L (ref 22–32)
Calcium: 10.2 mg/dL (ref 8.9–10.3)
Chloride: 110 mmol/L (ref 98–111)
Creatinine, Ser: 0.82 mg/dL (ref 0.44–1.00)
GFR, Estimated: >60 mL/min (ref >60)
Glucose, Bld: 117 mg/dL — ABNORMAL HIGH (ref 70–99)
Potassium: 4.2 mmol/L (ref 3.5–5.1)
Sodium: 139 mmol/L (ref 135–145)

## 2022-02-21 DIAGNOSIS — I7091 Generalized atherosclerosis: Secondary | ICD-10-CM | POA: Diagnosis not present

## 2022-02-22 DIAGNOSIS — I5032 Chronic diastolic (congestive) heart failure: Secondary | ICD-10-CM | POA: Diagnosis not present

## 2022-02-22 DIAGNOSIS — D649 Anemia, unspecified: Secondary | ICD-10-CM | POA: Diagnosis not present

## 2022-02-22 DIAGNOSIS — E440 Moderate protein-calorie malnutrition: Secondary | ICD-10-CM | POA: Diagnosis not present

## 2022-02-22 DIAGNOSIS — M810 Age-related osteoporosis without current pathological fracture: Secondary | ICD-10-CM | POA: Diagnosis not present

## 2022-02-22 DIAGNOSIS — E876 Hypokalemia: Secondary | ICD-10-CM | POA: Diagnosis not present

## 2022-02-22 DIAGNOSIS — I110 Hypertensive heart disease with heart failure: Secondary | ICD-10-CM | POA: Diagnosis not present

## 2022-02-25 DIAGNOSIS — S42402A Unspecified fracture of lower end of left humerus, initial encounter for closed fracture: Secondary | ICD-10-CM | POA: Diagnosis not present

## 2022-03-02 DIAGNOSIS — R2689 Other abnormalities of gait and mobility: Secondary | ICD-10-CM | POA: Diagnosis not present

## 2022-03-02 DIAGNOSIS — G9389 Other specified disorders of brain: Secondary | ICD-10-CM | POA: Diagnosis not present

## 2022-03-02 DIAGNOSIS — W1830XA Fall on same level, unspecified, initial encounter: Secondary | ICD-10-CM | POA: Diagnosis not present

## 2022-03-02 DIAGNOSIS — F0390 Unspecified dementia without behavioral disturbance: Secondary | ICD-10-CM | POA: Diagnosis not present

## 2022-03-04 DIAGNOSIS — S42402A Unspecified fracture of lower end of left humerus, initial encounter for closed fracture: Secondary | ICD-10-CM | POA: Diagnosis not present

## 2022-03-04 DIAGNOSIS — Z9889 Other specified postprocedural states: Secondary | ICD-10-CM | POA: Diagnosis not present

## 2022-03-09 DIAGNOSIS — E7849 Other hyperlipidemia: Secondary | ICD-10-CM | POA: Diagnosis not present

## 2022-03-09 DIAGNOSIS — F0390 Unspecified dementia without behavioral disturbance: Secondary | ICD-10-CM | POA: Diagnosis not present

## 2022-03-09 DIAGNOSIS — E876 Hypokalemia: Secondary | ICD-10-CM | POA: Diagnosis not present

## 2022-03-09 DIAGNOSIS — I5032 Chronic diastolic (congestive) heart failure: Secondary | ICD-10-CM | POA: Diagnosis not present

## 2022-03-09 DIAGNOSIS — E119 Type 2 diabetes mellitus without complications: Secondary | ICD-10-CM | POA: Diagnosis not present

## 2022-03-09 DIAGNOSIS — E559 Vitamin D deficiency, unspecified: Secondary | ICD-10-CM | POA: Diagnosis not present

## 2022-03-16 DIAGNOSIS — I10 Essential (primary) hypertension: Secondary | ICD-10-CM | POA: Diagnosis not present

## 2022-03-16 DIAGNOSIS — E876 Hypokalemia: Secondary | ICD-10-CM | POA: Diagnosis not present

## 2022-03-16 DIAGNOSIS — E213 Hyperparathyroidism, unspecified: Secondary | ICD-10-CM | POA: Diagnosis not present

## 2022-03-16 DIAGNOSIS — F0390 Unspecified dementia without behavioral disturbance: Secondary | ICD-10-CM | POA: Diagnosis not present

## 2022-03-24 ENCOUNTER — Other Ambulatory Visit: Payer: Self-pay

## 2022-03-24 ENCOUNTER — Inpatient Hospital Stay (HOSPITAL_COMMUNITY)
Admission: EM | Admit: 2022-03-24 | Discharge: 2022-03-26 | DRG: 871 | Disposition: A | Discharge status: Skilled Nursing Facility | Payer: Medicare PPO | Attending: Internal Medicine | Admitting: Internal Medicine

## 2022-03-24 ENCOUNTER — Emergency Department (HOSPITAL_COMMUNITY): Payer: Medicare PPO

## 2022-03-24 ENCOUNTER — Encounter (HOSPITAL_COMMUNITY): Payer: Self-pay | Admitting: Emergency Medicine

## 2022-03-24 DIAGNOSIS — U071 COVID-19: Secondary | ICD-10-CM | POA: Diagnosis not present

## 2022-03-24 DIAGNOSIS — Z79899 Other long term (current) drug therapy: Secondary | ICD-10-CM

## 2022-03-24 DIAGNOSIS — A4189 Other specified sepsis: Principal | ICD-10-CM | POA: Diagnosis present

## 2022-03-24 DIAGNOSIS — R5383 Other fatigue: Secondary | ICD-10-CM | POA: Diagnosis not present

## 2022-03-24 DIAGNOSIS — F419 Anxiety disorder, unspecified: Secondary | ICD-10-CM | POA: Diagnosis present

## 2022-03-24 DIAGNOSIS — Z86711 Personal history of pulmonary embolism: Secondary | ICD-10-CM | POA: Diagnosis not present

## 2022-03-24 DIAGNOSIS — Z8673 Personal history of transient ischemic attack (TIA), and cerebral infarction without residual deficits: Secondary | ICD-10-CM

## 2022-03-24 DIAGNOSIS — Z91040 Latex allergy status: Secondary | ICD-10-CM

## 2022-03-24 DIAGNOSIS — R509 Fever, unspecified: Principal | ICD-10-CM

## 2022-03-24 DIAGNOSIS — I10 Essential (primary) hypertension: Secondary | ICD-10-CM | POA: Diagnosis not present

## 2022-03-24 DIAGNOSIS — Z885 Allergy status to narcotic agent status: Secondary | ICD-10-CM | POA: Diagnosis not present

## 2022-03-24 DIAGNOSIS — Z66 Do not resuscitate: Secondary | ICD-10-CM | POA: Diagnosis present

## 2022-03-24 DIAGNOSIS — E785 Hyperlipidemia, unspecified: Secondary | ICD-10-CM | POA: Diagnosis present

## 2022-03-24 DIAGNOSIS — B9620 Unspecified Escherichia coli [E. coli] as the cause of diseases classified elsewhere: Secondary | ICD-10-CM | POA: Diagnosis present

## 2022-03-24 DIAGNOSIS — Z7401 Bed confinement status: Secondary | ICD-10-CM | POA: Diagnosis not present

## 2022-03-24 DIAGNOSIS — R4182 Altered mental status, unspecified: Secondary | ICD-10-CM | POA: Diagnosis present

## 2022-03-24 DIAGNOSIS — Z91018 Allergy to other foods: Secondary | ICD-10-CM | POA: Diagnosis not present

## 2022-03-24 DIAGNOSIS — S0990XA Unspecified injury of head, initial encounter: Secondary | ICD-10-CM | POA: Diagnosis not present

## 2022-03-24 DIAGNOSIS — Z8249 Family history of ischemic heart disease and other diseases of the circulatory system: Secondary | ICD-10-CM

## 2022-03-24 DIAGNOSIS — E7800 Pure hypercholesterolemia, unspecified: Secondary | ICD-10-CM | POA: Diagnosis not present

## 2022-03-24 DIAGNOSIS — F0390 Unspecified dementia without behavioral disturbance: Secondary | ICD-10-CM | POA: Diagnosis not present

## 2022-03-24 DIAGNOSIS — Z9011 Acquired absence of right breast and nipple: Secondary | ICD-10-CM

## 2022-03-24 DIAGNOSIS — Z888 Allergy status to other drugs, medicaments and biological substances status: Secondary | ICD-10-CM | POA: Diagnosis not present

## 2022-03-24 DIAGNOSIS — Z7901 Long term (current) use of anticoagulants: Secondary | ICD-10-CM | POA: Diagnosis not present

## 2022-03-24 DIAGNOSIS — Z853 Personal history of malignant neoplasm of breast: Secondary | ICD-10-CM | POA: Diagnosis not present

## 2022-03-24 DIAGNOSIS — Z91048 Other nonmedicinal substance allergy status: Secondary | ICD-10-CM

## 2022-03-24 DIAGNOSIS — N390 Urinary tract infection, site not specified: Secondary | ICD-10-CM | POA: Diagnosis not present

## 2022-03-24 DIAGNOSIS — G9341 Metabolic encephalopathy: Secondary | ICD-10-CM | POA: Diagnosis present

## 2022-03-24 DIAGNOSIS — R6520 Severe sepsis without septic shock: Secondary | ICD-10-CM | POA: Diagnosis not present

## 2022-03-24 DIAGNOSIS — S42412A Displaced simple supracondylar fracture without intercondylar fracture of left humerus, initial encounter for closed fracture: Secondary | ICD-10-CM | POA: Diagnosis not present

## 2022-03-24 DIAGNOSIS — K449 Diaphragmatic hernia without obstruction or gangrene: Secondary | ICD-10-CM | POA: Diagnosis not present

## 2022-03-24 DIAGNOSIS — M2550 Pain in unspecified joint: Secondary | ICD-10-CM | POA: Diagnosis not present

## 2022-03-24 DIAGNOSIS — Z803 Family history of malignant neoplasm of breast: Secondary | ICD-10-CM

## 2022-03-24 DIAGNOSIS — W19XXXA Unspecified fall, initial encounter: Secondary | ICD-10-CM | POA: Diagnosis not present

## 2022-03-24 DIAGNOSIS — A419 Sepsis, unspecified organism: Secondary | ICD-10-CM | POA: Diagnosis present

## 2022-03-24 DIAGNOSIS — R531 Weakness: Secondary | ICD-10-CM | POA: Diagnosis not present

## 2022-03-24 LAB — CBC WITH DIFFERENTIAL/PLATELET
Abs Immature Granulocytes: 0.03 10*3/uL (ref 0.00–0.07)
Basophils Absolute: 0 10*3/uL (ref 0.0–0.1)
Basophils Relative: 0 %
Eosinophils Absolute: 0 10*3/uL (ref 0.0–0.5)
Eosinophils Relative: 0 %
HCT: 34.5 % — ABNORMAL LOW (ref 36.0–46.0)
Hemoglobin: 11.7 g/dL — ABNORMAL LOW (ref 12.0–15.0)
Immature Granulocytes: 0 %
Lymphocytes Relative: 5 %
Lymphs Abs: 0.4 10*3/uL — ABNORMAL LOW (ref 0.7–4.0)
MCH: 32.2 pg (ref 26.0–34.0)
MCHC: 33.9 g/dL (ref 30.0–36.0)
MCV: 95 fL (ref 80.0–100.0)
Monocytes Absolute: 0.7 10*3/uL (ref 0.1–1.0)
Monocytes Relative: 9 %
Neutro Abs: 6.3 10*3/uL (ref 1.7–7.7)
Neutrophils Relative %: 86 %
Platelets: 219 10*3/uL (ref 150–400)
RBC: 3.63 MIL/uL — ABNORMAL LOW (ref 3.87–5.11)
RDW: 13.6 % (ref 11.5–15.5)
WBC: 7.4 10*3/uL (ref 4.0–10.5)
nRBC: 0 % (ref 0.0–0.2)

## 2022-03-24 LAB — COMPREHENSIVE METABOLIC PANEL
ALT: 13 U/L (ref 0–44)
AST: 22 U/L (ref 15–41)
Albumin: 3.6 g/dL (ref 3.5–5.0)
Alkaline Phosphatase: 47 U/L (ref 38–126)
Anion gap: 8 (ref 5–15)
BUN: 18 mg/dL (ref 8–23)
CO2: 21 mmol/L — ABNORMAL LOW (ref 22–32)
Calcium: 10.3 mg/dL (ref 8.9–10.3)
Chloride: 105 mmol/L (ref 98–111)
Creatinine, Ser: 0.85 mg/dL (ref 0.44–1.00)
GFR, Estimated: >60 mL/min (ref >60)
Glucose, Bld: 155 mg/dL — ABNORMAL HIGH (ref 70–99)
Potassium: 3.8 mmol/L (ref 3.5–5.1)
Sodium: 134 mmol/L — ABNORMAL LOW (ref 135–145)
Total Bilirubin: 1.5 mg/dL — ABNORMAL HIGH (ref 0.3–1.2)
Total Protein: 6.4 g/dL — ABNORMAL LOW (ref 6.5–8.1)

## 2022-03-24 LAB — URINALYSIS, ROUTINE W REFLEX MICROSCOPIC
Bilirubin Urine: NEGATIVE
Glucose, UA: NEGATIVE mg/dL
Hgb urine dipstick: NEGATIVE
Ketones, ur: NEGATIVE mg/dL
Nitrite: NEGATIVE
Protein, ur: NEGATIVE mg/dL
Specific Gravity, Urine: 1.006 (ref 1.005–1.030)
pH: 5 (ref 5.0–8.0)

## 2022-03-24 LAB — RESP PANEL BY RT-PCR (FLU A&B, COVID) ARPGX2
Influenza A by PCR: NEGATIVE
Influenza B by PCR: NEGATIVE
SARS Coronavirus 2 by RT PCR: POSITIVE — AB

## 2022-03-24 LAB — LACTIC ACID, PLASMA: Lactic Acid, Venous: 1.3 mmol/L (ref 0.5–1.9)

## 2022-03-24 LAB — PROTIME-INR
INR: 1.1 (ref 0.8–1.2)
Prothrombin Time: 13.9 seconds (ref 11.4–15.2)

## 2022-03-24 LAB — APTT: aPTT: 32 seconds (ref 24–36)

## 2022-03-24 MED ORDER — VANCOMYCIN HCL 1750 MG/350ML IV SOLN
1750.0000 mg | Freq: Once | INTRAVENOUS | Status: AC
  Administered 2022-03-24: 1750 mg via INTRAVENOUS
  Filled 2022-03-24: qty 350

## 2022-03-24 MED ORDER — ACETAMINOPHEN 325 MG PO TABS
650.0000 mg | ORAL_TABLET | Freq: Four times a day (QID) | ORAL | Status: DC | PRN
  Administered 2022-03-25: 650 mg via ORAL
  Filled 2022-03-24: qty 2

## 2022-03-24 MED ORDER — VANCOMYCIN HCL 750 MG/150ML IV SOLN: 750.0000 mg | INTRAVENOUS | Status: DC

## 2022-03-24 MED ORDER — SODIUM CHLORIDE 0.9 % IV SOLN
2.0000 g | INTRAVENOUS | Status: DC
  Administered 2022-03-24: 2 g via INTRAVENOUS
  Filled 2022-03-24: qty 20

## 2022-03-24 MED ORDER — LACTATED RINGERS IV SOLN: INTRAVENOUS | Status: DC

## 2022-03-24 MED ORDER — ACETAMINOPHEN 650 MG RE SUPP: 650.0000 mg | Freq: Four times a day (QID) | RECTAL | Status: DC | PRN

## 2022-03-24 MED ORDER — SODIUM CHLORIDE 0.9 % IV BOLUS
1000.0000 mL | Freq: Once | INTRAVENOUS | Status: AC
Start: 2022-03-24 — End: 2022-03-24
  Administered 2022-03-24: 1000 mL via INTRAVENOUS

## 2022-03-24 MED ORDER — ALBUTEROL SULFATE HFA 108 (90 BASE) MCG/ACT IN AERS: 1.0000 | INHALATION_SPRAY | RESPIRATORY_TRACT | Status: DC | PRN

## 2022-03-24 MED ORDER — ACETAMINOPHEN 325 MG PO TABS
650.0000 mg | ORAL_TABLET | Freq: Once | ORAL | Status: AC
  Administered 2022-03-24: 650 mg via ORAL
  Filled 2022-03-24: qty 2

## 2022-03-24 NOTE — ED Notes (Signed)
Only able to obtain one set of cultures - MD notified

## 2022-03-24 NOTE — Progress Notes (Signed)
Pharmacy Antibiotic Note  Sarah Cook is a 70 y.o. female admitted on 03/24/2022 with sepsis.  Pharmacy has been consulted for Vancomycin dosing.  Plan: Vancomycin 1750 mg IV x 1, followed by 750 mg IV q24h (eAUC 458.6, SCr 0.85, goal AUC 400-550) Ceftriaxone 2 g IV q24h (per MD) Follow-up clinical status, renal function Follow-up cultures, LOT, de-escalate as able Obtain Vancomycin levels as appropriate  Height: '5\' 1"'$  (154.9 cm) Weight: 77.1 kg (170 lb) IBW/kg (Calculated) : 47.8  Temp (24hrs), Avg:99.6 F (37.6 C), Min:99 F (37.2 C), Max:100.6 F (38.1 C)  Recent Labs  Lab 03/24/22 1936  WBC 7.4  CREATININE 0.85  LATICACIDVEN 1.3    Estimated Creatinine Clearance: 57 mL/min (by C-G formula based on SCr of 0.85 mg/dL).    Allergies  Allergen Reactions   Anesthetics, Ester Nausea And Vomiting   Codeine Nausea Only   Tape Other (See Comments)    Skin blisters   Amlodipine Swelling   Cetirizine Hcl     Other reaction(s): altered consciousness   Hydrochlorothiazide Other (See Comments)    Unknown reaction   Hydrocodone Nausea And Vomiting   Onion Other (See Comments)    Does not like   Latex Itching and Rash    Irritates skin    Antimicrobials this admission: Vancomycin 5/18 >>  Ceftriaxone 5/18 >>   Microbiology results: 5/18 BCx: pending 5/18 UCx: pending    Thank you for allowing pharmacy to be a part of this patient's care.  Vance Peper, PharmD PGY1 Pharmacy Resident 03/24/2022 11:06 PM   Please check AMION for all [place] phone numbers After 10:00 PM, call [place] [phone]

## 2022-03-24 NOTE — ED Triage Notes (Addendum)
Pt BIB [facility] from [facility] memory care unit. Oriented to self at baseline, oriented to self only on arrival. Pt fell yesterday without known injury, unsure if hit head. Pt takes eliquis. Pt was slower to ambulate yesterday, not acting normal. Pt normally ambulatory and social. Facility reports pt slept all day today. Pt reports tenderness to R thigh but able to bear weight. EMS reports malodorous urine smell coming from pt. Facility reports temp of 102.4. EMS VS- HR 104, RR 20, 154/88, SpO2 94%, placed on 2L via [place] by EMS. Pt not able to tolerate PO meds en route, not given tylenol.

## 2022-03-24 NOTE — Sepsis Progress Note (Signed)
Elink following Code Sepsis. 

## 2022-03-24 NOTE — ED Notes (Signed)
First lactic within normal limits - second lactic not needed at this time per MD

## 2022-03-25 ENCOUNTER — Encounter (HOSPITAL_COMMUNITY): Payer: Self-pay | Admitting: Internal Medicine

## 2022-03-25 DIAGNOSIS — G9341 Metabolic encephalopathy: Secondary | ICD-10-CM | POA: Diagnosis not present

## 2022-03-25 DIAGNOSIS — U071 COVID-19: Secondary | ICD-10-CM | POA: Diagnosis not present

## 2022-03-25 DIAGNOSIS — S42412A Displaced simple supracondylar fracture without intercondylar fracture of left humerus, initial encounter for closed fracture: Secondary | ICD-10-CM | POA: Diagnosis present

## 2022-03-25 DIAGNOSIS — Z86711 Personal history of pulmonary embolism: Secondary | ICD-10-CM | POA: Diagnosis not present

## 2022-03-25 LAB — CBC WITH DIFFERENTIAL/PLATELET
Abs Immature Granulocytes: 0.03 10*3/uL (ref 0.00–0.07)
Basophils Absolute: 0 10*3/uL (ref 0.0–0.1)
Basophils Relative: 0 %
Eosinophils Absolute: 0 10*3/uL (ref 0.0–0.5)
Eosinophils Relative: 0 %
HCT: 30 % — ABNORMAL LOW (ref 36.0–46.0)
Hemoglobin: 10 g/dL — ABNORMAL LOW (ref 12.0–15.0)
Immature Granulocytes: 1 %
Lymphocytes Relative: 18 %
Lymphs Abs: 0.8 10*3/uL (ref 0.7–4.0)
MCH: 32.1 pg (ref 26.0–34.0)
MCHC: 33.3 g/dL (ref 30.0–36.0)
MCV: 96.2 fL (ref 80.0–100.0)
Monocytes Absolute: 0.7 10*3/uL (ref 0.1–1.0)
Monocytes Relative: 15 %
Neutro Abs: 3 10*3/uL (ref 1.7–7.7)
Neutrophils Relative %: 66 %
Platelets: 191 10*3/uL (ref 150–400)
RBC: 3.12 MIL/uL — ABNORMAL LOW (ref 3.87–5.11)
RDW: 13.7 % (ref 11.5–15.5)
WBC: 4.6 10*3/uL (ref 4.0–10.5)
nRBC: 0 % (ref 0.0–0.2)

## 2022-03-25 LAB — COMPREHENSIVE METABOLIC PANEL
ALT: 13 U/L (ref 0–44)
AST: 17 U/L (ref 15–41)
Albumin: 2.9 g/dL — ABNORMAL LOW (ref 3.5–5.0)
Alkaline Phosphatase: 37 U/L — ABNORMAL LOW (ref 38–126)
Anion gap: 5 (ref 5–15)
BUN: 13 mg/dL (ref 8–23)
CO2: 23 mmol/L (ref 22–32)
Calcium: 9.7 mg/dL (ref 8.9–10.3)
Chloride: 113 mmol/L — ABNORMAL HIGH (ref 98–111)
Creatinine, Ser: 0.82 mg/dL (ref 0.44–1.00)
GFR, Estimated: >60 mL/min (ref >60)
Glucose, Bld: 105 mg/dL — ABNORMAL HIGH (ref 70–99)
Potassium: 4 mmol/L (ref 3.5–5.1)
Sodium: 141 mmol/L (ref 135–145)
Total Bilirubin: 1 mg/dL (ref 0.3–1.2)
Total Protein: 5.3 g/dL — ABNORMAL LOW (ref 6.5–8.1)

## 2022-03-25 LAB — BLOOD GAS, VENOUS
Acid-Base Excess: 0.4 mmol/L (ref 0.0–2.0)
Bicarbonate: 22.3 mmol/L (ref 20.0–28.0)
O2 Saturation: 99 %
Patient temperature: 37
pCO2, Ven: 28 mmHg — ABNORMAL LOW (ref 44–60)
pH, Ven: 7.51 — ABNORMAL HIGH (ref 7.25–7.43)
pO2, Ven: 179 mmHg — ABNORMAL HIGH (ref 32–45)

## 2022-03-25 LAB — BASIC METABOLIC PANEL
Anion gap: 9 (ref 5–15)
BUN: 13 mg/dL (ref 8–23)
CO2: 20 mmol/L — ABNORMAL LOW (ref 22–32)
Calcium: 9.8 mg/dL (ref 8.9–10.3)
Chloride: 112 mmol/L — ABNORMAL HIGH (ref 98–111)
Creatinine, Ser: 0.77 mg/dL (ref 0.44–1.00)
GFR, Estimated: >60 mL/min (ref >60)
Glucose, Bld: 96 mg/dL (ref 70–99)
Potassium: 3.9 mmol/L (ref 3.5–5.1)
Sodium: 141 mmol/L (ref 135–145)

## 2022-03-25 LAB — PHOSPHORUS: Phosphorus: 2.4 mg/dL — ABNORMAL LOW (ref 2.5–4.6)

## 2022-03-25 LAB — MAGNESIUM
Magnesium: 1.9 mg/dL (ref 1.7–2.4)
Magnesium: 1.6 mg/dL — ABNORMAL LOW (ref 1.7–2.4)

## 2022-03-25 LAB — C-REACTIVE PROTEIN: CRP: 5.7 mg/dL — ABNORMAL HIGH (ref <1.0)

## 2022-03-25 LAB — PROCALCITONIN: Procalcitonin: <0.1 ng/mL

## 2022-03-25 LAB — TSH: TSH: 0.906 u[IU]/mL (ref 0.350–4.500)

## 2022-03-25 MED ORDER — SODIUM CHLORIDE 0.9 % IV SOLN: INTRAVENOUS | Status: DC

## 2022-03-25 MED ORDER — APIXABAN 5 MG PO TABS
5.0000 mg | ORAL_TABLET | Freq: Two times a day (BID) | ORAL | Status: DC
  Administered 2022-03-25 – 2022-03-26 (×3): 5 mg via ORAL
  Filled 2022-03-25 (×3): qty 1

## 2022-03-25 MED ORDER — MOLNUPIRAVIR EUA 200MG CAPSULE
4.0000 | ORAL_CAPSULE | Freq: Two times a day (BID) | ORAL | Status: DC
  Administered 2022-03-25 – 2022-03-26 (×3): 800 mg via ORAL
  Filled 2022-03-25 (×2): qty 4

## 2022-03-25 MED ORDER — LAMOTRIGINE 100 MG PO TABS
100.0000 mg | ORAL_TABLET | Freq: Two times a day (BID) | ORAL | Status: DC
  Administered 2022-03-25 – 2022-03-26 (×3): 100 mg via ORAL
  Filled 2022-03-25 (×4): qty 1

## 2022-03-25 MED ORDER — ROSUVASTATIN CALCIUM 5 MG PO TABS
10.0000 mg | ORAL_TABLET | Freq: Every day | ORAL | Status: DC
  Administered 2022-03-25 – 2022-03-26 (×2): 10 mg via ORAL
  Filled 2022-03-25 (×2): qty 2

## 2022-03-25 MED ORDER — MAGNESIUM SULFATE 2 GM/50ML IV SOLN
2.0000 g | Freq: Once | INTRAVENOUS | Status: AC
  Administered 2022-03-25: 2 g via INTRAVENOUS
  Filled 2022-03-25: qty 50

## 2022-03-25 MED ORDER — DEXTROSE 5 % IV SOLN
15.0000 mmol | Freq: Once | INTRAVENOUS | Status: AC
  Administered 2022-03-25: 15 mmol via INTRAVENOUS
  Filled 2022-03-25: qty 5

## 2022-03-25 NOTE — Assessment & Plan Note (Signed)
 #)   History of pulmonary embolism: It appears that the patient was diagnosed with acute pulmonary embolism in March 2023, and has subsequently been on Eliquis.  This is notable in the context of her presenting COVID-19 infection, and will be prohibitive for initiation of Paxlovid.  Plan: Continue home Eliquis.

## 2022-03-25 NOTE — Assessment & Plan Note (Signed)
 #)   Acute metabolic encephalopathy: 1 day of increased somnolence/lethargy relative to baseline mental status, which appears to be metabolic in nature as consequence of severe sepsis due to COVID-19 infection.  No other source of underlying infection identified at this time, including urinalysis with insignificant pyuria from quantitative standpoint that is inconsistent with UTI, while chest x-ray shows no evidence of acute cardiopulmonary process.  We will add on procalcitonin level to further evaluate.  Additional potential treatment factors include potential mild dehydration, mild hypercalcemia.  Acute CVA felt to be less likely.  No evidence to suggest underlying seizures.  No overt pharmacologic contributions.  Plan: Further evaluation management as for sepsis due to COVID-19 infection, as below.  N.p.o. until mental status improves such that the patient is able to follow instructions and able to participate in/pass nursing bedside swallow screen.  Add on procalcitonin level.  Gentle IV fluids.  Further evaluation management of mild hypercalcemia, as further detailed below.  Repeat CMP and CBC in the morning.  Fall precautions ordered.  Check ionized calcium level, MMA, TSH, VBG.

## 2022-03-25 NOTE — Plan of Care (Signed)
  Problem: Education: Goal: Knowledge of risk factors and measures for prevention of condition will improve Outcome: Progressing   Problem: Coping: Goal: Psychosocial and spiritual needs will be supported Outcome: Progressing   Problem: Respiratory: Goal: Will maintain a patent airway Outcome: Progressing Goal: Complications related to the disease process, condition or treatment will be avoided or minimized Outcome: Progressing   

## 2022-03-25 NOTE — Progress Notes (Signed)
PROGRESS NOTE    Sarah Cook  OQH:[id number] DOB: 1950-11-16 DOA: 03/24/2022 PCP: Cari Caraway, MD    Chief Complaint  Patient presents with   Altered Mental Status    Brief Narrative:   Sarah Cook is a 71 y.o. female with medical history significant for dementia, pulmonary embolism on Eliquis, essential hypertension, hyperlipidemia, who is admitted to [facility] on 03/24/2022 with acute metabolic encephalopathy after presenting from SNF to [facility] ED for evaluation of altered mental status. -Patient presents from facility with increased lethargy, fever of 102, work-up significant for COVID-19 infection, no oxygen requirement, electrolyte derangement including mild hypercalcemia, hypomagnesemia, hypophosphatemia, oral intake, she is admitted for further management.  Assessment & Plan:   Principal Problem:   Acute metabolic encephalopathy Active Problems:   Hypertension   History of pulmonary embolism   Hyperlipidemia   Hypercalcemia   Severe sepsis ([place])   COVID-19 virus infection   Left supracondylar humerus fracture  Acute metabolic [id number] encephalopathy -She presents with increased lethargy, somnolence relative to her baseline mental status which is dementia, but usually she is interactive. -This is most likely due to COVID-19 infection. -She remains clinically dry, oral intake still unreliable, will start on IV fluids.   Sepsis present on admission due to COVID-19 infection - SIRS criteria met via objective fever, tachycardia.  Status -no Hypoxia or pneumonia, no indication for IV steroids. -She is high risk for decompensation, will continue with molnupiravir  Hypomagnesemia -Give 2 g of IV magnesium, recheck in a.m.  Hypophosphatemia -Treat with IV phosphorus, monitor closely for refeeding syndrome, recheck in a.m.  History of nondisplaced fracture of distal left humerus: Recent such fracture, diagnosed on 02/18/2022, with cast on left  upper extremity noted at time of physical exam. - Prn acetaminophen.  Essential Hypertension -Blood pressure remains acceptable for now, so we will hold on resuming home medication till his oral intake is more reliable, continue to hold irbesartan and hydralazine for now  Hyperlipidemia -  continue home statin.    History of pulmonary embolism:  - Continue home Eliquis.    DVT prophylaxis: Eliquis Code Status: DNR Family Communication: D/W husband by phone Disposition: Will need to go back to [facility] care unit hopefully in 1 to 2 days  Status is: Inpatient Remains inpatient appropriate because: Mentation not back to baseline, oral intake is unreliable, high risk for decompensation due to COVID, need close monitoring, electrolyte abnormalities needing IV replacement as well.   Consultants:  None   Subjective:  No significant events overnight as discussed with staff, Hemax 100.6 yesterday evening  Objective: Vitals:   03/25/22 0715 03/25/22 0730 03/25/22 0745 03/25/22 0800  BP: (!) 144/76 (!) 145/83 (!) 146/83 (!) 142/79  Pulse: 84 87 91 88  Resp: _0 Temp:      TempSrc:      SpO2: 90% 93% 91% 92%  Weight:      Height:        Intake/Output Summary (Last 24 hours) at 03/25/2022 1134 Last data filed at 03/25/2022 0323 Gross per 24 hour  Intake 350 ml  Output --  Net 350 ml   Filed Weights   03/24/22 2215  Weight: 77.1 kg    Examination:   Awake, interactive, pleasant, confused, frail symmetrical Chest wall movement, Good air movement bilaterally, CTAB RRR,No Gallops,Rubs or new Murmurs, No Parasternal Heave +ve B.Sounds, Abd Soft, No tenderness, No rebound - guarding or rigidity. No Cyanosis, Clubbing or edema, No  new Rash or bruise     Data Reviewed: I have personally reviewed following labs and imaging studies  CBC: Recent Labs  Lab 03/24/22 1936 03/25/22 0253  WBC 7.4 4.6  NEUTROABS 6.3 3.0  HGB 11.7* 10.0*  HCT 34.5*  30.0*  MCV 95.0 96.2  PLT 219 355    Basic Metabolic Panel: Recent Labs  Lab 03/24/22 1936 03/25/22 0253 03/25/22 0812  NA 134* 141  --   K 3.8 4.0  --   CL 105 113*  --   CO2 21* 23  --   GLUCOSE 155* 105*  --   BUN 18 13  --   CREATININE 0.85 0.82  --   CALCIUM 10.3 9.7  --   MG  --  1.9 1.6*  PHOS  --   --  2.4*    GFR: Estimated Creatinine Clearance: 59.1 mL/min (by C-G formula based on SCr of 0.82 mg/dL).  Liver Function Tests: Recent Labs  Lab 03/24/22 1936 03/25/22 0253  AST 22 17  ALT 13 13  ALKPHOS 47 37*  BILITOT 1.5* 1.0  PROT 6.4* 5.3*  ALBUMIN 3.6 2.9*    CBG: No results for input(s): GLUCAP in the last 168 hours.   Recent Results (from the past 240 hour(s))  Resp Panel by RT-PCR (Flu A&B, Covid) Nasopharyngeal Swab     Status: Abnormal   Collection Time: 03/24/22  7:18 PM   Specimen: Nasopharyngeal Swab; Nasopharyngeal(NP) swabs in vial transport medium  Result Value Ref Range Status   SARS Coronavirus 2 by RT PCR POSITIVE (A) NEGATIVE Final    Comment: (NOTE) SARS-CoV-2 target nucleic acids are DETECTED.  The SARS-CoV-2 RNA is generally detectable in upper respiratory specimens during the acute phase of infection. Positive results are indicative of the presence of the identified virus, but do not rule out bacterial infection or co-infection with other pathogens not detected by the test. Clinical correlation with patient history and other diagnostic information is necessary to determine patient infection status. The expected result is Negative.  Fact Sheet for Patients: EntrepreneurPulse.com.au  Fact Sheet for Healthcare Providers: IncredibleEmployment.be  This test is not yet approved or cleared by the [place] FDA and  has been authorized for detection and/or diagnosis of SARS-CoV-2 by FDA under an Emergency Use Authorization (EUA).  This EUA will remain in effect (meaning this test can be  used) for the duration of  the COVID-19 declaration under Section 564(b)(1) of the A ct, 21 U.S.C. section 360bbb-3(b)(1), unless the authorization is terminated or revoked sooner.     Influenza A by PCR NEGATIVE NEGATIVE Final   Influenza B by PCR NEGATIVE NEGATIVE Final    Comment: (NOTE) The Xpert Xpress SARS-CoV-2/FLU/RSV plus assay is intended as an aid in the diagnosis of influenza from Nasopharyngeal swab specimens and should not be used as a sole basis for treatment. Nasal washings and aspirates are unacceptable for Xpert Xpress SARS-CoV-2/FLU/RSV testing.  Fact Sheet for Patients: EntrepreneurPulse.com.au  Fact Sheet for Healthcare Providers: IncredibleEmployment.be  This test is not yet approved or cleared by the [place] FDA and has been authorized for detection and/or diagnosis of SARS-CoV-2 by FDA under an Emergency Use Authorization (EUA). This EUA will remain in effect (meaning this test can be used) for the duration of the COVID-19 declaration under Section 564(b)(1) of the Act, 21 U.S.C. section 360bbb-3(b)(1), unless the authorization is terminated or revoked.  Performed at [facility], [place] [address] [place], [place] [address]  Blood Culture (routine x 2)     Status: None (Preliminary result)   Collection Time: 03/24/22  7:36 PM   Specimen: BLOOD  Result Value Ref Range Status   Specimen Description BLOOD BLOOD RIGHT WRIST  Final   Special Requests   Final    BOTTLES DRAWN AEROBIC AND ANAEROBIC Blood Culture adequate volume   Culture   Final    NO GROWTH < 24 HOURS Performed at [facility], [place] [address] [place], [place] [address]    Report Status PENDING  Incomplete  Blood Culture (routine x 2)     Status: None (Preliminary result)   Collection Time: 03/25/22  2:53 AM   Specimen: BLOOD RIGHT HAND  Result Value Ref Range Status   Specimen Description BLOOD RIGHT HAND  Final   Special Requests    Final    AEROBIC BOTTLE ONLY Blood Culture results may not be optimal due to an inadequate volume of blood received in culture bottles   Culture   Final    NO GROWTH < 12 HOURS Performed at [facility], [place] [address], [place], [place] [address]    Report Status PENDING  Incomplete         Radiology Studies: DG Chest Port 1 View  Result Date: 03/24/2022 CLINICAL DATA:  Questionable sepsis. EXAM: PORTABLE CHEST 1 VIEW COMPARISON:  Chest radiograph dated 02/18/2022. FINDINGS: The heart size and mediastinal contours are within normal limits. Both lungs are clear. A hiatal hernia is noted. Degenerative changes are seen in the spine. IMPRESSION: No active disease. Electronically Signed   By: Zerita Boers M.D.   On: 03/24/2022 20:07        Scheduled Meds:  apixaban  5 mg Oral BID WC   lamoTRIgine  100 mg Oral BID   molnupiravir EUA  4 capsule Oral BID   rosuvastatin  10 mg Oral Q breakfast   Continuous Infusions:  sodium chloride     magnesium sulfate bolus IVPB     sodium phosphate  Dextrose 5% IVPB       LOS: 1 day       Phillips Climes, MD [facility]   To contact the attending provider between 7A-7P or the covering provider during after hours 7P-7A, please log into the web site [email or web address] and access using universal [place] password for that web site. If you do not have the password, please call the hospital operator.  03/25/2022, 11:34 AM

## 2022-03-25 NOTE — Assessment & Plan Note (Signed)
 #)   Hyperlipidemia: documented h/o such. On rosuvastatin as outpatient.    Plan: continue home statin.   

## 2022-03-25 NOTE — Assessment & Plan Note (Signed)
 #)   Essential Hypertension: documented h/o such, with outpatient antihypertensive regimen including irbesartan, hydralazine.  SBP's in the ED today: Normotensive.   Plan: Close monitoring of subsequent BP via routine VS. hold home antihypertensive medications for now in the setting of presenting severe sepsis.

## 2022-03-25 NOTE — Assessment & Plan Note (Signed)
  #)   Hypercalcemia: Presenting labs reflect serum calcium 10.6 when taking into account mild hypoalbuminemia.  Suspect element of dehydration, without overt pharmacologic contribution. Will initiate gentle IVF's, as above, with repeat calcium level in the morning, with consideration for further expansion of work-up if no ensuing improvement in serum calcium level following interval IVF administration.     Plan: LR, as above.  Monitor strict I's&O's, daily weights.  CMP in the morning.  Ionized calcium.  Check serum Mg and Phos levels.

## 2022-03-25 NOTE — H&P (Signed)
History and Physical    PLEASE NOTE THAT DRAGON DICTATION SOFTWARE WAS USED IN THE CONSTRUCTION OF THIS NOTE.   Sarah Cook RFX:[id number] DOB: 04-13-1951 DOA: 03/24/2022  PCP: Cari Caraway, MD  Patient coming from: snf  I have personally briefly reviewed patient's old medical records in [place]  Chief Complaint: Altered mental status  HPI: Sarah Cook is a 71 y.o. female with medical history significant for dementia, pulmonary embolism on Eliquis, essential hypertension, hyperlipidemia, who is admitted to [facility] on 03/24/2022 with acute metabolic encephalopathy after presenting from SNF to [facility] ED for evaluation of altered mental status.  In the setting of the patient's current altered mental status, the following history is provided by SNF staff as well as my discussions with the EDP and via chart review.  SNF staff reports that over the last day, patient has exhibited evidence of increased somnolence and lethargy relative to her baseline mental status and baseline activity level.  No preceding trauma.  They subsequently noted her temperature on the afternoon of 03/24/2022 to be 102 F, representing a new fever for her.  She was subsequently brought to [facility] emergency department for further evaluation of her altered mental status as well as fever.     ED Course:  Vital signs in the ED were notable for the following: Temperature max 100.6; heart rate 79-97; blood pressure 116/70; respiratory rate 14-17, oxygen saturation 96 to 98% on room air.  Labs were notable for the following: CMP notable for the following: Creatinine 0.85, glucose 155, calcium, corrected for mild hypoalbuminemia 10.6, albumin 3.6, otherwise liver enzymes within normal limits.  Lactate 1.3.  CBC notable for white blood cell count 7400, hemoglobin 11.7.  INR 1.1.  Urinalysis showed 6-10 white blood cells, rare bacteria, nitrate negative.  COVID-19 PCR positive while influenza AMB PCR  negative.  Blood cultures x2 were collected prior to initiation of IV antibiotics.  Imaging and additional notable ED work-up: EKG shows sinus rhythm with heart rate 90, normal intervals, and no evidence of T wave or ST changes, including no evidence of ST elevation.  Chest x-ray shows no evidence of acute cardiopulmonary process, clear evidence of infiltrate, no effusion, edema, or pneumothorax.  While in the ED, the following were administered: Acetaminophen 650 mg p.o. x1, Rocephin, IV vancomycin, normal saline x1 L bolus followed by initiation continuous LR at 150 cc/h.  Subsequently, the patient was admitted for further evaluation management of acute metabolic encephalopathy in the setting of severe sepsis due to COVID-19 infection, with presenting labs also notable for mild hypercalcemia.     Review of Systems: As per HPI otherwise 10 point review of systems negative.   Past Medical History:  Diagnosis Date   Allergy    Anxiety    Breast cancer ([place])    Cataract    early signs of   Gait abnormality    Heart disease    Hemorrhoids    Hiatal hernia 11/07/2012   large   Hyperlipidemia    Hyperparathyroidism ([place])    Hypertension    Memory loss    Osteoporosis    Parathyroid disorder ([place])    Pulmonary nodules    TIA (transient ischemic attack)    Urinary incontinence     Past Surgical History:  Procedure Laterality Date   BLADDER SUSPENSION     CESAREAN SECTION     x 2   CHOLECYSTECTOMY     MASTECTOMY Right    PARATHYROIDECTOMY  TOTAL ELBOW REPLACEMENT     TUBAL LIGATION      Social History:  reports that she has never smoked. She has never used smokeless tobacco. She reports that she does not drink alcohol and does not use drugs.   Allergies  Allergen Reactions   Anesthetics, Ester Nausea And Vomiting   Codeine Nausea Only   Tape Other (See Comments)    Skin blisters   Amlodipine Swelling   Cetirizine Hcl     Other reaction(s): altered consciousness    Hydrochlorothiazide Other (See Comments)    Unknown reaction   Hydrocodone Nausea And Vomiting   Onion Other (See Comments)    Does not like   Latex Itching and Rash    Irritates skin    Family History  Problem Relation Age of Onset   Breast cancer Mother    Sudden death Father    Heart failure Father    Hypertension Other    Heart disease Other    Colon cancer Neg Hx    Esophageal cancer Neg Hx    Stomach cancer Neg Hx    Rectal cancer Neg Hx     Family history reviewed and not pertinent    Prior to Admission medications   Medication Sig Start Date End Date Taking? Authorizing Provider  apixaban (ELIQUIS) 5 MG TABS tablet Take 1 tablet (5 mg total) by mouth 2 (two) times daily. Patient taking differently: Take 5 mg by mouth 2 (two) times daily with a meal. 12/07/21  Yes Gonfa, Charlesetta Ivory, MD  Ascorbic Acid (VITAMIN C) 1000 MG tablet Take 1,000 mg by mouth daily with lunch.   Yes [provider]  Cholecalciferol (VITAMIN D3) 2000 units TABS Take 2,000 Units by mouth daily with lunch.   Yes [provider]  Coenzyme Q10 (CO Q 10) 100 MG CAPS Take 100 mg by mouth daily with supper.   Yes [provider]  Digestive Enzyme CAPS Take 1 capsule by mouth daily with breakfast.   Yes [provider]  hydrALAZINE (APRESOLINE) 50 MG tablet Take 1 tablet (50 mg total) by mouth 3 (three) times daily with meals. 01/27/22  Yes Aline August, MD  irbesartan (AVAPRO) 300 MG tablet Take 300 mg by mouth daily with breakfast. 06/12/17  Yes [provider]  magnesium oxide (MAG-OX) 400 MG tablet Take 400 mg by mouth daily with supper.   Yes [provider]  Multiple Vitamin (MULTI VITAMIN DAILY PO) Take 1 tablet by mouth daily.   Yes [provider]  rosuvastatin (CRESTOR) 10 MG tablet Take 10 mg by mouth daily with breakfast. 06/18/21  Yes [provider]  saccharomyces boulardii (FLORASTOR) 250 MG capsule Take 250 mg by mouth at  bedtime.   Yes [provider]  TURMERIC CURCUMIN PO Take 1,000 mg by mouth daily with supper.   Yes [provider]  docusate sodium (COLACE) 100 MG capsule Take 100 mg by mouth daily with supper.    [provider]  feeding supplement (ENSURE ENLIVE / ENSURE PLUS) LIQD Take 237 mLs by mouth 2 (two) times daily between meals. Patient taking differently: Take 237 mLs by mouth daily with breakfast. 10/21/21   Pokhrel, Corrie Mckusick, MD  hydrALAZINE (APRESOLINE) 25 MG tablet TAKE 1 AND 1/2 TABLETS BY MOUTH THREE TIMES DAILY Patient not taking: Reported on 03/24/2022    [provider]  lamoTRIgine (LAMICTAL) 100 MG tablet Take 1 tablet (100 mg total) by mouth 2 (two) times daily. Patient not taking:  Reported on 03/24/2022 06/23/21   Marcial Pacas, MD  potassium chloride SA (KLOR-CON M) 20 MEQ tablet Take 1 tablet (20 mEq total) by mouth 2 (two) times daily for 15 days. 01/27/22 02/11/22  Aline August, MD  Probiotic Product (PROBIOTIC PO) Take 1 capsule by mouth daily with supper.    [provider]     Objective    Physical Exam: Vitals:   03/25/22 0130 03/25/22 0145 03/25/22 0200 03/25/22 0515  BP: 116/70 109/65 132/73 139/74  Pulse: 80 84 87 78  Resp: _0 Temp:      TempSrc:      SpO2: 94% 92% 94% 97%  Weight:      Height:        General: appears to be stated age; somnolent, briefly opening eyes to verbal stimuli, before falling back to sleep Skin: warm, dry, no rash Head:  AT/[place] Mouth:  Oral mucosa membranes appear dry, normal dentition Neck: supple; trachea midline Heart:  RRR; did not appreciate any M/R/G Lungs: CTAB, did not appreciate any wheezes, rales, or rhonchi Abdomen: + BS; soft, ND, NT Vascular: 2+ pedal pulses b/l; 2+ radial pulses b/l Extremities: no peripheral edema, no muscle wasting Neuro:  In the setting of the patient's current mental status and associated inability to follow instructions, unable to perform full  neurologic exam at this time.  As such, assessment of strength, sensation, and cranial nerves is limited at this time. Patient noted to spontaneously move all 4 extremities. No tremors.      Labs on Admission: I have personally reviewed following labs and imaging studies  CBC: Recent Labs  Lab 03/24/22 1936 03/25/22 0253  WBC 7.4 4.6  NEUTROABS 6.3 3.0  HGB 11.7* 10.0*  HCT 34.5* 30.0*  MCV 95.0 96.2  PLT 219 962   Basic Metabolic Panel: Recent Labs  Lab 03/24/22 1936 03/25/22 0253  NA 134* 141  K 3.8 4.0  CL 105 113*  CO2 21* 23  GLUCOSE 155* 105*  BUN 18 13  CREATININE 0.85 0.82  CALCIUM 10.3 9.7  MG  --  1.9   GFR: Estimated Creatinine Clearance: 59.1 mL/min (by C-G formula based on SCr of 0.82 mg/dL). Liver Function Tests: Recent Labs  Lab 03/24/22 1936 03/25/22 0253  AST 22 17  ALT 13 13  ALKPHOS 47 37*  BILITOT 1.5* 1.0  PROT 6.4* 5.3*  ALBUMIN 3.6 2.9*   No results for input(s): LIPASE, AMYLASE in the last 168 hours. No results for input(s): AMMONIA in the last 168 hours. Coagulation Profile: Recent Labs  Lab 03/24/22 1936  INR 1.1   Cardiac Enzymes: No results for input(s): CKTOTAL, CKMB, CKMBINDEX, TROPONINI in the last 168 hours. BNP (last 3 results) No results for input(s): PROBNP in the last 8760 hours. HbA1C: No results for input(s): HGBA1C in the last 72 hours. CBG: No results for input(s): GLUCAP in the last 168 hours. Lipid Profile: No results for input(s): CHOL, HDL, LDLCALC, TRIG, CHOLHDL, LDLDIRECT in the last 72 hours. Thyroid Function Tests: No results for input(s): TSH, T4TOTAL, FREET4, T3FREE, THYROIDAB in the last 72 hours. Anemia Panel: No results for input(s): VITAMINB12, FOLATE, FERRITIN, TIBC, IRON, RETICCTPCT in the last 72 hours. Urine analysis:    Component Value Date/Time   COLORURINE STRAW (A) 03/24/2022 2022   APPEARANCEUR CLEAR 03/24/2022 2022   LABSPEC 1.006 03/24/2022 2022   PHURINE 5.0 03/24/2022 2022    GLUCOSEU NEGATIVE 03/24/2022 2022   HGBUR NEGATIVE 03/24/2022 2022  BILIRUBINUR NEGATIVE 03/24/2022 2022   [place] 03/24/2022 2022   PROTEINUR NEGATIVE 03/24/2022 2022   UROBILINOGEN 0.2 02/20/2009 1445   NITRITE NEGATIVE 03/24/2022 2022   LEUKOCYTESUR SMALL (A) 03/24/2022 2022    Radiological Exams on Admission: DG Chest Port 1 View  Result Date: 03/24/2022 CLINICAL DATA:  Questionable sepsis. EXAM: PORTABLE CHEST 1 VIEW COMPARISON:  Chest radiograph dated 02/18/2022. FINDINGS: The heart size and mediastinal contours are within normal limits. Both lungs are clear. A hiatal hernia is noted. Degenerative changes are seen in the spine. IMPRESSION: No active disease. Electronically Signed   By: Zerita Boers M.D.   On: 03/24/2022 20:07     EKG: Independently reviewed, with result as described above.    Assessment/Plan   Principal Problem:   Acute metabolic encephalopathy Active Problems:   Hypertension   History of pulmonary embolism   Hyperlipidemia   Hypercalcemia   Severe sepsis ([place])   COVID-19 virus infection   Left supracondylar humerus fracture      #) Acute metabolic encephalopathy: 1 day of increased somnolence/lethargy relative to baseline mental status, which appears to be metabolic in nature as consequence of severe sepsis due to COVID-19 infection.  No other source of underlying infection identified at this time, including urinalysis with insignificant pyuria from quantitative standpoint that is inconsistent with UTI, while chest x-ray shows no evidence of acute cardiopulmonary process.  We will add on procalcitonin level to further evaluate.  Additional potential treatment factors include potential mild dehydration, mild hypercalcemia.  Acute CVA felt to be less likely.  No evidence to suggest underlying seizures.  No overt pharmacologic contributions.  Plan: Further evaluation management as for sepsis due to COVID-19 infection, as below.  N.p.o. until  mental status improves such that the patient is able to follow instructions and able to participate in/pass nursing bedside swallow screen.  Add on procalcitonin level.  Gentle IV fluids.  Further evaluation management of mild hypercalcemia, as further detailed below.  Repeat CMP and CBC in the morning.  Fall precautions ordered.  Check ionized calcium level, MMA, TSH, VBG.       #) COVID-19 infection: In the setting of 1 day of altered mental status, COVID-19 PCR found to be positive, with present chest x-ray showed no evidence of acute cardiopulmonary process.  In the absence of any associated acute respiratory symptoms, and as the patient is being admitted for something other than COVID-19, it appears that this meets criteria for incidental finding.  Given these criteria, there does not appear to be an indication for initiation of remdesivir at this time.  However, as the patient's symptoms started less than 1 day ago, and she is at increased risk for progression of her COVID-19 infection on the basis of her age, will initiate Molnupiravir, while refraining from [place] as the patient is currently on Eliquis in the setting of pulmonary embolism identified in March 2023.  She is maintaining oxygen saturations in the range of 96 to 90% on room air.  Therefore, it does not appear that she currently meets criteria for initiation of systemic corticosteroids.  Plan: Start Molnupiravir, as above.  Monitor continuous pulse oximetry.  Add on CRP.  Add on procalcitonin level.  Repeat CBC with differential in the morning.  Check serum magnesium and phosphorus levels.  Flutter valve.  Incentive spirometry.  Airborne precautions.          #) Severe sepsis due to COVID-19 infection: In the setting of COVID-19 PCR positive result,  SIRS criteria met via objective fever, tachycardia. Lactic acid level: Nonelevated. Of note, given the associated presence of suspected end organ damage in the form of  concominant presenting acute encephalopathy, criteria are met for pt's sepsis to be considered severe in nature. However, in the absence of lactic acid level that is greater than or equal to 4.0, and in the absence of any associated hypotension refractory to IVF's, there are no indications for administration of a 30 mL/kg IVF bolus at this time.   Of note, no evidence of underlying bacterial source of infection, which appears consistent with the absence of leukocytosis and spite of objective fever.  Specifically, chest x-ray shows no evidence of infiltrate to suggest pneumonia, while urinalysis is not consistent with UTI.  We will add on procalcitonin level to further assess.  Aside from an elevated procalcitonin level, will plan to discontinue IV antibiotics, will continue to monitor for results of blood cultures x2 collected in the ED today prior to initiation of IV antibiotics.   Plan: CBC w/ diff and CMP in AM.  Follow for results of blood cx's x 2. Abx: Discontinue Rocephin and IV vancomycin for now, as above.  Add on procalcitonin level.  Further evaluation management of COVID-19 infection, as above.        #) Hypercalcemia: Presenting labs reflect serum calcium 10.6 when taking into account mild hypoalbuminemia.  Suspect element of dehydration, without overt pharmacologic contribution. Will initiate gentle IVF's, as above, with repeat calcium level in the morning, with consideration for further expansion of work-up if no ensuing improvement in serum calcium level following interval IVF administration.     Plan: LR, as above.  Monitor strict I's&O's, daily weights.  CMP in the morning.  Ionized calcium.  Check serum Mg and Phos levels.           #) History of nondisplaced fracture of distal left humerus: Recent such fracture, diagnosed on 02/18/2022, with cast on left upper extremity noted at time of physical exam.  Plan: Prn acetaminophen.           #) Essential  Hypertension: documented h/o such, with outpatient antihypertensive regimen including irbesartan, hydralazine.  SBP's in the ED today: Normotensive.   Plan: Close monitoring of subsequent BP via routine VS. hold home antihypertensive medications for now in the setting of presenting severe sepsis.           #) Hyperlipidemia: documented h/o such. On rosuvastatin as outpatient.    Plan: continue home statin.          #) History of pulmonary embolism: It appears that the patient was diagnosed with acute pulmonary embolism in March 2023, and has subsequently been on Eliquis.  This is notable in the context of her presenting COVID-19 infection, and will be prohibitive for initiation of Paxlovid.  Plan: Continue home Eliquis.       DVT prophylaxis: SCD's plus home Eliquis Code Status: DNR as conveyed by patient's husband and consistent with CODE STATUS during most recent 3 hospitalizations disposition Plan: Per Rounding Team Consults called: none;  Admission status: Inpatient   PLEASE NOTE THAT DRAGON DICTATION SOFTWARE WAS USED IN THE CONSTRUCTION OF THIS NOTE.   [place] DO [facility]  From [place]   03/25/2022, 6:39 AM

## 2022-03-25 NOTE — Assessment & Plan Note (Signed)
#)   History of nondisplaced fracture of distal left humerus: Recent such fracture, diagnosed on 02/18/2022, with cast on left upper extremity noted at time of physical exam.  Plan: Prn acetaminophen.

## 2022-03-25 NOTE — Assessment & Plan Note (Signed)
 #)   COVID-19 infection: In the setting of 1 day of altered mental status, COVID-19 PCR found to be positive, with present chest x-ray showed no evidence of acute cardiopulmonary process.  In the absence of any associated acute respiratory symptoms, and as the patient is being admitted for something other than COVID-19, it appears that this meets criteria for incidental finding.  Given these criteria, there does not appear to be an indication for initiation of remdesivir at this time.  However, as the patient's symptoms started less than 1 day ago, and she is at increased risk for progression of her COVID-19 infection on the basis of her age, will initiate Molnupiravir, while refraining from [place] as the patient is currently on Eliquis in the setting of pulmonary embolism identified in March 2023.  She is maintaining oxygen saturations in the range of 96 to 90% on room air.  Therefore, it does not appear that she currently meets criteria for initiation of systemic corticosteroids.  Plan: Start Molnupiravir, as above.  Monitor continuous pulse oximetry.  Add on CRP.  Add on procalcitonin level.  Repeat CBC with differential in the morning.  Check serum magnesium and phosphorus levels.  Flutter valve.  Incentive spirometry.  Airborne precautions.

## 2022-03-25 NOTE — Assessment & Plan Note (Signed)
 #)   Severe sepsis due to COVID-19 infection: In the setting of COVID-19 PCR positive result,  SIRS criteria met via objective fever, tachycardia. Lactic acid level: Nonelevated. Of note, given the associated presence of suspected end organ damage in the form of concominant presenting acute encephalopathy, criteria are met for pt's sepsis to be considered severe in nature. However, in the absence of lactic acid level that is greater than or equal to 4.0, and in the absence of any associated hypotension refractory to IVF's, there are no indications for administration of a 30 mL/kg IVF bolus at this time.   Of note, no evidence of underlying bacterial source of infection, which appears consistent with the absence of leukocytosis and spite of objective fever.  Specifically, chest x-ray shows no evidence of infiltrate to suggest pneumonia, while urinalysis is not consistent with UTI.  We will add on procalcitonin level to further assess.  Aside from an elevated procalcitonin level, will plan to discontinue IV antibiotics, will continue to monitor for results of blood cultures x2 collected in the ED today prior to initiation of IV antibiotics.   Plan: CBC w/ diff and CMP in AM.  Follow for results of blood cx's x 2. Abx: Discontinue Rocephin and IV vancomycin for now, as above.  Add on procalcitonin level.  Further evaluation management of COVID-19 infection, as above.

## 2022-03-26 DIAGNOSIS — U071 COVID-19: Secondary | ICD-10-CM | POA: Diagnosis not present

## 2022-03-26 DIAGNOSIS — G9341 Metabolic encephalopathy: Secondary | ICD-10-CM | POA: Diagnosis not present

## 2022-03-26 LAB — CBC
HCT: 28.3 % — ABNORMAL LOW (ref 36.0–46.0)
Hemoglobin: 9.7 g/dL — ABNORMAL LOW (ref 12.0–15.0)
MCH: 32.1 pg (ref 26.0–34.0)
MCHC: 34.3 g/dL (ref 30.0–36.0)
MCV: 93.7 fL (ref 80.0–100.0)
Platelets: 212 10*3/uL (ref 150–400)
RBC: 3.02 MIL/uL — ABNORMAL LOW (ref 3.87–5.11)
RDW: 13.3 % (ref 11.5–15.5)
WBC: 3.8 10*3/uL — ABNORMAL LOW (ref 4.0–10.5)
nRBC: 0 % (ref 0.0–0.2)

## 2022-03-26 LAB — MAGNESIUM: Magnesium: 1.9 mg/dL (ref 1.7–2.4)

## 2022-03-26 LAB — PHOSPHORUS: Phosphorus: 2.3 mg/dL — ABNORMAL LOW (ref 2.5–4.6)

## 2022-03-26 MED ORDER — SODIUM CHLORIDE 0.9 % IV SOLN
1.0000 g | INTRAVENOUS | Status: DC
  Administered 2022-03-26: 1 g via INTRAVENOUS
  Filled 2022-03-26: qty 10

## 2022-03-26 MED ORDER — MOLNUPIRAVIR EUA 200MG CAPSULE
4.0000 | ORAL_CAPSULE | Freq: Two times a day (BID) | ORAL | 0 refills | Status: AC
Start: 2022-03-26 — End: 2022-03-31

## 2022-03-26 MED ORDER — K PHOS MONO-SOD PHOS DI & MONO 155-852-130 MG PO TABS
250.0000 mg | ORAL_TABLET | Freq: Three times a day (TID) | ORAL | Status: DC
  Administered 2022-03-26: 250 mg via ORAL
  Filled 2022-03-26: qty 1

## 2022-03-26 MED ORDER — HYDRALAZINE HCL 50 MG PO TABS: 50.0000 mg | ORAL_TABLET | Freq: Three times a day (TID) | ORAL | 0 refills | Status: DC

## 2022-03-26 MED ORDER — ACETAMINOPHEN 325 MG PO TABS: 650.0000 mg | ORAL_TABLET | Freq: Four times a day (QID) | ORAL | Status: DC | PRN

## 2022-03-26 MED ORDER — IRBESARTAN 300 MG PO TABS: 300.0000 mg | ORAL_TABLET | Freq: Every day | ORAL | Status: AC

## 2022-03-26 MED ORDER — CEPHALEXIN 500 MG PO CAPS
500.0000 mg | ORAL_CAPSULE | Freq: Three times a day (TID) | ORAL | 0 refills | Status: AC
Start: 2022-03-27 — End: 2022-03-29

## 2022-03-26 MED ORDER — K PHOS MONO-SOD PHOS DI & MONO 155-852-130 MG PO TABS: 250.0000 mg | ORAL_TABLET | Freq: Three times a day (TID) | ORAL | 0 refills | Status: AC

## 2022-03-26 NOTE — TOC Initial Note (Signed)
Transition of Care [facility]) - Initial/Assessment Note    Patient Details  Name: INEZE SERRAO MRN: [id number] Date of Birth: 11-07-1951  [facility] of [facility]) CM/SW Contact:    Bary Castilla, LCSW Phone Number:[phone] 03/26/2022, 12:47 PM  Clinical Narrative:                  CSW spoke with pt's spouse Ollen Gross and confirmed that pt was from [facility] memory care and the plan is for her to return. CSW informed Avin that pt may be medically ready to discharge back to facility today.CSW inquired about how pt is typically transport and was informed through the non-emergency transportation.  CSW called [facility] twice at [phone] however did not reach anyone or able to leave a message. CSW  Summer RN number [phone]. CSW spoke with Summer and she checked with director and pt is able to return today with a DC summary. CSW notoofed MD that pt can return to facility today if medically ready.  TOC team will continue to assist with discharge planning needs.     Expected Discharge Plan: Memory Care     Patient Goals and CMS Choice        Expected Discharge Plan and Services Expected Discharge Plan: Memory Care                                              Prior Living Arrangements/Services   Lives with:: Facility Resident                   Activities of Daily Living      Permission Sought/Granted      Share Information with NAME: spouse-[name]  Permission granted to share info w AGENCY: [place] granted to share info w Relationship: Spouse  Permission granted to share info w Contact Information: [phone]  Emotional Assessment   Attitude/Demeanor/Rapport: Unable to Assess Affect (typically observed): Unable to Assess Orientation: : Oriented to Self      Admission diagnosis:  Severe sepsis ([place]) [A41.9, R65.20] Patient Active Problem List   Diagnosis Date Noted   Acute metabolic encephalopathy 50/35/4656    COVID-19 virus infection 03/25/2022   Left supracondylar humerus fracture 03/25/2022   Severe sepsis ([place]) 03/24/2022   Generalized weakness 01/25/2022   Decreased oral intake 01/25/2022   History of pulmonary embolism 01/25/2022   Obesity (BMI 30-39.9) 01/25/2022   Malnutrition of moderate degree 01/25/2022   Pulmonary embolism ([place]) 11/07/2021   Acute encephalopathy 11/06/2021   Chronic diastolic CHF (congestive heart failure) ([place]) 11/06/2021   Anemia 11/06/2021   E-coli UTI 10/19/2021   Hypercalcemia 10/18/2021   Dementia without behavioral disturbance ([place]) 07/22/2021   Hypertensive urgency 07/21/2021   Refractory hypokalemia 07/20/2021   Generalized muscle weakness 07/20/2021   Encephalomalacia 07/20/2021   Urinary incontinence 07/20/2021   Hypophosphatemia 07/20/2021   Memory loss 06/23/2021   Confusion 06/23/2021   Encephalopathy acute 10/18/2017   Post-concussion headache 10/18/2017   Altered mental status 81/27/5170   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[place]) 03/09/2016   Chest pain 2016/02/19   History of antineoplastic chemotherapy with cardiotoxic drugs Feb 19, 2016   Family history of sudden cardiac death in father 02/19/16   History of breast cancer    Hemorrhoids    Hiatal hernia    Pulmonary nodules  Hyperparathyroidism ([place])    Hypertension    Hyperlipidemia    Heart disease    Allergy    Mood disorder ([place])    TIA (transient ischemic attack)    Cataract    Osteoporosis    Parathyroid disorder ([place])    Irritable bowel syndrome 12/11/2013   Headache(784.0) 12/10/2013   PCP:  Cari Caraway, MD Pharmacy:   [facility] - [address] AT [address] [place] [address] Phone: [phone] Fax: [phone]     Social Determinants of Health (SDOH) Interventions    Readmission Risk Interventions    01/26/2022    2:21 PM  Readmission Risk  Prevention Plan  Transportation Screening Complete  PCP or Specialist Appt within 3-5 Days Complete  HRI or [place] Complete  Social Work Consult for [place] Planning/Counseling Complete  Palliative Care Screening Not Applicable  Medication Review Press photographer) Complete

## 2022-03-26 NOTE — Discharge Summary (Signed)
Physician Discharge Summary  Sarah Cook CMK:[id number] DOB: 05/31/1951 DOA: 03/24/2022  PCP: Cari Caraway, MD  Admit date: 03/24/2022 Discharge date: 03/26/2022  Admitted From: Memory care Disposition:  Memory care   Recommendations for Outpatient Follow-up:  Follow up with PCP in 1-2 weeks Please follow on the final results of urine cultures Please check CBC, BMP, phosphorus, magnesium level and the end of the week.   Discharge Condition:Stable CODE STATUS: DNR Diet recommendation: Heart Healthy  Brief/Interim Summary:  Sarah Cook is a 71 y.o. female with medical history significant for dementia, pulmonary embolism on Eliquis, essential hypertension, hyperlipidemia, who is admitted to [facility] on 03/24/2022 with acute metabolic encephalopathy after presenting from SNF to [facility] ED for evaluation of altered mental status. -Patient presents from facility with increased lethargy, fever of 102, work-up significant for COVID-19 infection, no oxygen requirement, electrolyte derangement including mild hypercalcemia, hypomagnesemia, hypophosphatemia, oral intake, she is admitted for further management.  Acute metabolic [id number] encephalopathy -She presents with increased lethargy, somnolence relative to her baseline mental status which is dementia, but usually she is interactive. -This is most likely due to COVID-19 infection. -Mentation back to baseline after hydration, UTI treatment, COVID-19 treatment.     Sepsis present on admission due to COVID-19 infection - SIRS criteria met via objective fever, tachycardia.  Altered mental status -no Hypoxia or pneumonia, no indication for IV steroids. -She is high risk for decompensation, she was started on molnupiravir, to be continued on discharge to finish total of 5 days   Hypomagnesemia -repleted   UTI -Urine culture growing  E. coli , as has been in the past, pansensitive, she was treated with 3 days of IV  Rocephin, to receive another 2 days of oral Keflex on discharge   Hypophosphatemia -repleted , she will be discharged on Neutra-Phos for next 3 days as well   History of nondisplaced fracture of distal left humerus: Recent such fracture, diagnosed on 02/18/2022, with cast on left upper extremity noted at time of physical exam. - Prn acetaminophen.   Essential Hypertension -blood pressure is acceptable, to hold irbesartan and hydralazine upon discharge and to resume in few days.  To hold irbesartan and hydralazine for now   Hyperlipidemia -  continue home statin.    History of pulmonary embolism:  - Continue home Eliquis.  Discharge Diagnoses:  Principal Problem:   Acute metabolic encephalopathy Active Problems:   Hypertension   History of pulmonary embolism   Hyperlipidemia   Hypercalcemia   Severe sepsis ([place])   COVID-19 virus infection   Left supracondylar humerus fracture    Discharge Instructions  Discharge Instructions     Diet - low sodium heart healthy   Complete by: As directed    Diet - low sodium heart healthy   Complete by: As directed    Discharge instructions   Complete by: As directed    Follow with Primary MD Cari Caraway, MD in 7 days   Get CBC, CMP, checked  by Primary MD next visit.    Activity: As tolerated with Full fall precautions use walker/cane & assistance as needed   Disposition Home    Diet:Regular Diet  On your next visit with your primary care physician please Get Medicines reviewed and adjusted.   Please request your Prim.MD to go over all Hospital Tests and Procedure/Radiological results at the follow up, please get all Hospital records sent to your Prim MD by signing hospital release before you go home.   If  you experience worsening of your admission symptoms, develop shortness of breath, life threatening emergency, suicidal or homicidal thoughts you must seek medical attention immediately by calling 911 or calling your MD  immediately  if symptoms less severe.  You Must read complete instructions/literature along with all the possible adverse reactions/side effects for all the Medicines you take and that have been prescribed to you. Take any new Medicines after you have completely understood and accpet all the possible adverse reactions/side effects.   Do not drive, operating heavy machinery, perform activities at heights, swimming or participation in water activities or provide baby sitting services if your were admitted for syncope or siezures until you have seen by Primary MD or a Neurologist and advised to do so again.  Do not drive when taking Pain medications.    Do not take more than prescribed Pain, Sleep and Anxiety Medications  Special Instructions: If you have smoked or chewed Tobacco  in the last 2 yrs please stop smoking, stop any regular Alcohol  and or any Recreational drug use.  Wear Seat belts while driving.   Please note  You were cared for by a hospitalist during your hospital stay. If you have any questions about your discharge medications or the care you received while you were in the hospital after you are discharged, you can call the unit and asked to speak with the hospitalist on call if the hospitalist that took care of you is not available. Once you are discharged, your primary care physician will handle any further medical issues. Please note that NO REFILLS for any discharge medications will be authorized once you are discharged, as it is imperative that you return to your primary care physician (or establish a relationship with a primary care physician if you do not have one) for your aftercare needs so that they can reassess your need for medications and monitor your lab values.   Discharge instructions   Complete by: As directed    Follow with Primary MD Cari Caraway, MD in 7 days   Get CBC, CMP, 2 view Chest X ray checked  by Primary MD next visit.    Activity: As tolerated  with Full fall precautions use walker/cane & assistance as needed   Disposition Memory care unit    Diet: Regular diet   On your next visit with your primary care physician please Get Medicines reviewed and adjusted.   Please request your Prim.MD to go over all Hospital Tests and Procedure/Radiological results at the follow up, please get all Hospital records sent to your Prim MD by signing hospital release before you go home.   If you experience worsening of your admission symptoms, develop shortness of breath, life threatening emergency, suicidal or homicidal thoughts you must seek medical attention immediately by calling 911 or calling your MD immediately  if symptoms less severe.  You Must read complete instructions/literature along with all the possible adverse reactions/side effects for all the Medicines you take and that have been prescribed to you. Take any new Medicines after you have completely understood and accpet all the possible adverse reactions/side effects.   Do not drive, operating heavy machinery, perform activities at heights, swimming or participation in water activities or provide baby sitting services if your were admitted for syncope or siezures until you have seen by Primary MD or a Neurologist and advised to do so again.  Do not drive when taking Pain medications.    Do not take more than prescribed Pain, Sleep  and Anxiety Medications  Special Instructions: If you have smoked or chewed Tobacco  in the last 2 yrs please stop smoking, stop any regular Alcohol  and or any Recreational drug use.  Wear Seat belts while driving.   Please note  You were cared for by a hospitalist during your hospital stay. If you have any questions about your discharge medications or the care you received while you were in the hospital after you are discharged, you can call the unit and asked to speak with the hospitalist on call if the hospitalist that took care of you is not  available. Once you are discharged, your primary care physician will handle any further medical issues. Please note that NO REFILLS for any discharge medications will be authorized once you are discharged, as it is imperative that you return to your primary care physician (or establish a relationship with a primary care physician if you do not have one) for your aftercare needs so that they can reassess your need for medications and monitor your lab values.   Increase activity slowly   Complete by: As directed    Increase activity slowly   Complete by: As directed       Allergies as of 03/26/2022       Reactions   Anesthetics, Ester Nausea And Vomiting   Not listed on MAR   Codeine Nausea Only   Not listed on MAR   Tape Other (See Comments)   Skin blisters Not listed on MAR   Hydrochlorothiazide Other (See Comments)   Unknown reaction Not listed on MAR   Hydrocodone Nausea And Vomiting   Not listed on MAR   Norvasc [amlodipine] Swelling   Not listed on MAR   Zyrtec [cetirizine Hcl]    AMS Not listed on MAR   Latex Itching, Rash   Not listed on MAR        Medication List     TAKE these medications    acetaminophen 325 MG tablet Commonly known as: TYLENOL Take 2 tablets (650 mg total) by mouth every 6 (six) hours as needed for mild pain, fever or headache (or Fever >/= 101).   apixaban 5 MG Tabs tablet Commonly known as: ELIQUIS Take 1 tablet (5 mg total) by mouth 2 (two) times daily.   cephALEXin 500 MG capsule Commonly known as: KEFLEX Take 1 capsule (500 mg total) by mouth 3 (three) times daily for 2 days. Start taking on: Mar 27, 2022   cholecalciferol 25 MCG (1000 UNIT) tablet Commonly known as: VITAMIN D Take 2,000 Units by mouth daily.   Co Q 10 100 MG Caps Take 100 mg by mouth every evening.   CULTURELLE DIGESTIVE DAILY PO Take 1 capsule by mouth daily.   docusate sodium 100 MG capsule Commonly known as: COLACE Take 100 mg by mouth every evening.    feeding supplement Liqd Take 237 mLs by mouth 2 (two) times daily between meals.   hydrALAZINE 50 MG tablet Commonly known as: APRESOLINE Take 1 tablet (50 mg total) by mouth 3 (three) times daily with meals. Start taking on: Apr 01, 2022 What changed: These instructions start on Apr 01, 2022. If you are unsure what to do until then, ask your doctor or other care provider.   irbesartan 300 MG tablet Commonly known as: AVAPRO Take 1 tablet (300 mg total) by mouth daily. Start taking on: Mar 30, 2022 What changed: These instructions start on Mar 30, 2022. If you are unsure what to  do until then, ask your doctor or other care provider.   lamoTRIgine 100 MG tablet Commonly known as: LaMICtal Take 1 tablet (100 mg total) by mouth 2 (two) times daily.   magnesium oxide 400 MG tablet Commonly known as: MAG-OX Take 400 mg by mouth every evening.   molnupiravir EUA 200 mg Caps capsule Commonly known as: LAGEVRIO Take 4 capsules (800 mg total) by mouth 2 (two) times daily for 5 days.   MULTI VITAMIN DAILY PO Take 1 tablet by mouth daily.   phosphorus 155-852-130 MG tablet Commonly known as: K PHOS NEUTRAL Take 1 tablet (250 mg total) by mouth 3 (three) times daily for 3 days.   potassium chloride SA 20 MEQ tablet Commonly known as: KLOR-CON M Take 1 tablet (20 mEq total) by mouth 2 (two) times daily for 15 days.   rosuvastatin 10 MG tablet Commonly known as: CRESTOR Take 10 mg by mouth daily.   saccharomyces boulardii 250 MG capsule Commonly known as: FLORASTOR Take 250 mg by mouth daily.   vitamin C 500 MG tablet Commonly known as: ASCORBIC ACID Take 1,000 mg by mouth daily.        Allergies  Allergen Reactions   Anesthetics, Ester Nausea And Vomiting    Not listed on MAR   Codeine Nausea Only    Not listed on MAR   Tape Other (See Comments)    Skin blisters Not listed on MAR   Hydrochlorothiazide Other (See Comments)    Unknown reaction Not listed on MAR    Hydrocodone Nausea And Vomiting    Not listed on MAR   Norvasc [Amlodipine] Swelling    Not listed on MAR   Zyrtec [Cetirizine Hcl]     AMS Not listed on MAR   Latex Itching and Rash    Not listed on MAR    Consultations: none   Procedures/Studies: DG Chest Port 1 View  Result Date: 03/24/2022 CLINICAL DATA:  Questionable sepsis. EXAM: PORTABLE CHEST 1 VIEW COMPARISON:  Chest radiograph dated 02/18/2022. FINDINGS: The heart size and mediastinal contours are within normal limits. Both lungs are clear. A hiatal hernia is noted. Degenerative changes are seen in the spine. IMPRESSION: No active disease. Electronically Signed   By: Zerita Boers M.D.   On: 03/24/2022 20:07      Subjective:  No significant events overnight, she denies any complaints. Discharge Exam: Vitals:   03/26/22 1100 03/26/22 1356  BP: 125/74   Pulse: 73   Resp: 14   Temp: 98.3 F (36.8 C)   SpO2: 95% 95%   Vitals:   03/26/22 0511 03/26/22 0700 03/26/22 1100 03/26/22 1356  BP: 112/68 126/61 125/74   Pulse: 82 75 73   Resp: _0 Temp: 98.4 F (36.9 C) 98.4 F (36.9 C) 98.3 F (36.8 C)   TempSrc: Oral Oral Axillary   SpO2: 95% 96% 95% 95%  Weight:      Height:        General: Pt is alert, awake, not in acute distress, frail, demented Cardiovascular: RRR, S1/S2 +, no rubs, no gallops Respiratory: CTA bilaterally, no wheezing, no rhonchi Abdominal: Soft, NT, ND, bowel sounds + Extremities: no edema, no cyanosis    The results of significant diagnostics from this hospitalization (including imaging, microbiology, ancillary and laboratory) are listed below for reference.     Microbiology: Recent Results (from the past 240 hour(s))  Resp Panel by RT-PCR (Flu A&B, Covid) Nasopharyngeal Swab     Status:  Abnormal   Collection Time: 03/24/22  7:18 PM   Specimen: Nasopharyngeal Swab; Nasopharyngeal(NP) swabs in vial transport medium  Result Value Ref Range Status   SARS Coronavirus 2 by RT  PCR POSITIVE (A) NEGATIVE Final    Comment: (NOTE) SARS-CoV-2 target nucleic acids are DETECTED.  The SARS-CoV-2 RNA is generally detectable in upper respiratory specimens during the acute phase of infection. Positive results are indicative of the presence of the identified virus, but do not rule out bacterial infection or co-infection with other pathogens not detected by the test. Clinical correlation with patient history and other diagnostic information is necessary to determine patient infection status. The expected result is Negative.  Fact Sheet for Patients: EntrepreneurPulse.com.au  Fact Sheet for Healthcare Providers: IncredibleEmployment.be  This test is not yet approved or cleared by the [place] FDA and  has been authorized for detection and/or diagnosis of SARS-CoV-2 by FDA under an Emergency Use Authorization (EUA).  This EUA will remain in effect (meaning this test can be used) for the duration of  the COVID-19 declaration under Section 564(b)(1) of the A ct, 21 U.S.C. section 360bbb-3(b)(1), unless the authorization is terminated or revoked sooner.     Influenza A by PCR NEGATIVE NEGATIVE Final   Influenza B by PCR NEGATIVE NEGATIVE Final    Comment: (NOTE) The Xpert Xpress SARS-CoV-2/FLU/RSV plus assay is intended as an aid in the diagnosis of influenza from Nasopharyngeal swab specimens and should not be used as a sole basis for treatment. Nasal washings and aspirates are unacceptable for Xpert Xpress SARS-CoV-2/FLU/RSV testing.  Fact Sheet for Patients: EntrepreneurPulse.com.au  Fact Sheet for Healthcare Providers: IncredibleEmployment.be  This test is not yet approved or cleared by the [place] FDA and has been authorized for detection and/or diagnosis of SARS-CoV-2 by FDA under an Emergency Use Authorization (EUA). This EUA will remain in effect (meaning this test can be  used) for the duration of the COVID-19 declaration under Section 564(b)(1) of the Act, 21 U.S.C. section 360bbb-3(b)(1), unless the authorization is terminated or revoked.  Performed at [facility], [place] [address], [place], [place] [address]   Urine Culture     Status: Abnormal (Preliminary result)   Collection Time: 03/24/22  7:19 PM   Specimen: In/Out Cath Urine  Result Value Ref Range Status   Specimen Description IN/OUT CATH URINE  Final   Special Requests NONE  Final   Culture (A)  Final    >=100,000 COLONIES/mL ESCHERICHIA COLI SUSCEPTIBILITIES TO FOLLOW Performed at [facility], [place] [address], [place], [place] [address]    Report Status PENDING  Incomplete  Blood Culture (routine x 2)     Status: None (Preliminary result)   Collection Time: 03/24/22  7:36 PM   Specimen: BLOOD  Result Value Ref Range Status   Specimen Description BLOOD BLOOD RIGHT WRIST  Final   Special Requests   Final    BOTTLES DRAWN AEROBIC AND ANAEROBIC Blood Culture adequate volume   Culture   Final    NO GROWTH 2 DAYS Performed at [facility], [place] [address] [place], [place] [address]    Report Status PENDING  Incomplete  Blood Culture (routine x 2)     Status: None (Preliminary result)   Collection Time: 03/25/22  2:53 AM   Specimen: BLOOD RIGHT HAND  Result Value Ref Range Status   Specimen Description BLOOD RIGHT HAND  Final   Special Requests   Final    AEROBIC BOTTLE  ONLY Blood Culture results may not be optimal due to an inadequate volume of blood received in culture bottles   Culture   Final    NO GROWTH 1 DAY Performed at [place] [address], [place], [place] [address]    Report Status PENDING  Incomplete     Labs: BNP (last 3 results) No results for input(s): BNP in the last 8760 hours. Basic Metabolic Panel: Recent Labs  Lab 03/24/22 1936 03/25/22 0253 03/25/22 0812 03/25/22 1705 03/26/22 0952  NA 134* 141  --  141  --   K 3.8 4.0   --  3.9  --   CL 105 113*  --  112*  --   CO2 21* 23  --  20*  --   GLUCOSE 155* 105*  --  96  --   BUN 18 13  --  13  --   CREATININE 0.85 0.82  --  0.77  --   CALCIUM 10.3 9.7  --  9.8  --   MG  --  1.9 1.6*  --  1.9  PHOS  --   --  2.4*  --  2.3*   Liver Function Tests: Recent Labs  Lab 03/24/22 1936 03/25/22 0253  AST 22 17  ALT 13 13  ALKPHOS 47 37*  BILITOT 1.5* 1.0  PROT 6.4* 5.3*  ALBUMIN 3.6 2.9*   No results for input(s): LIPASE, AMYLASE in the last 168 hours. No results for input(s): AMMONIA in the last 168 hours. CBC: Recent Labs  Lab 03/24/22 1936 03/25/22 0253 03/26/22 0952  WBC 7.4 4.6 3.8*  NEUTROABS 6.3 3.0  --   HGB 11.7* 10.0* 9.7*  HCT 34.5* 30.0* 28.3*  MCV 95.0 96.2 93.7  PLT 219 191 212   Cardiac Enzymes: No results for input(s): CKTOTAL, CKMB, CKMBINDEX, TROPONINI in the last 168 hours. BNP: Invalid input(s): POCBNP CBG: No results for input(s): GLUCAP in the last 168 hours. D-Dimer No results for input(s): DDIMER in the last 72 hours. Hgb A1c No results for input(s): HGBA1C in the last 72 hours. Lipid Profile No results for input(s): CHOL, HDL, LDLCALC, TRIG, CHOLHDL, LDLDIRECT in the last 72 hours. Thyroid function studies Recent Labs    03/25/22 0812  TSH 0.906   Anemia work up No results for input(s): VITAMINB12, FOLATE, FERRITIN, TIBC, IRON, RETICCTPCT in the last 72 hours. Urinalysis    Component Value Date/Time   COLORURINE STRAW (A) 03/24/2022 2022   APPEARANCEUR CLEAR 03/24/2022 2022   LABSPEC 1.006 03/24/2022 2022   PHURINE 5.0 03/24/2022 2022   GLUCOSEU NEGATIVE 03/24/2022 2022   HGBUR NEGATIVE 03/24/2022 2022   BILIRUBINUR NEGATIVE 03/24/2022 2022   KETONESUR NEGATIVE 03/24/2022 2022   PROTEINUR NEGATIVE 03/24/2022 2022   UROBILINOGEN 0.2 02/20/2009 1445   NITRITE NEGATIVE 03/24/2022 2022   LEUKOCYTESUR SMALL (A) 03/24/2022 2022   Sepsis Labs Invalid input(s): PROCALCITONIN,  WBC,   LACTICIDVEN Microbiology Recent Results (from the past 240 hour(s))  Resp Panel by RT-PCR (Flu A&B, Covid) Nasopharyngeal Swab     Status: Abnormal   Collection Time: 03/24/22  7:18 PM   Specimen: Nasopharyngeal Swab; Nasopharyngeal(NP) swabs in vial transport medium  Result Value Ref Range Status   SARS Coronavirus 2 by RT PCR POSITIVE (A) NEGATIVE Final    Comment: (NOTE) SARS-CoV-2 target nucleic acids are DETECTED.  The SARS-CoV-2 RNA is generally detectable in upper respiratory specimens during the acute phase of infection. Positive results are indicative of the presence of  the identified virus, but do not rule out bacterial infection or co-infection with other pathogens not detected by the test. Clinical correlation with patient history and other diagnostic information is necessary to determine patient infection status. The expected result is Negative.  Fact Sheet for Patients: EntrepreneurPulse.com.au  Fact Sheet for Healthcare Providers: IncredibleEmployment.be  This test is not yet approved or cleared by the [place] FDA and  has been authorized for detection and/or diagnosis of SARS-CoV-2 by FDA under an Emergency Use Authorization (EUA).  This EUA will remain in effect (meaning this test can be used) for the duration of  the COVID-19 declaration under Section 564(b)(1) of the A ct, 21 U.S.C. section 360bbb-3(b)(1), unless the authorization is terminated or revoked sooner.     Influenza A by PCR NEGATIVE NEGATIVE Final   Influenza B by PCR NEGATIVE NEGATIVE Final    Comment: (NOTE) The Xpert Xpress SARS-CoV-2/FLU/RSV plus assay is intended as an aid in the diagnosis of influenza from Nasopharyngeal swab specimens and should not be used as a sole basis for treatment. Nasal washings and aspirates are unacceptable for Xpert Xpress SARS-CoV-2/FLU/RSV testing.  Fact Sheet for  Patients: EntrepreneurPulse.com.au  Fact Sheet for Healthcare Providers: IncredibleEmployment.be  This test is not yet approved or cleared by the [place] FDA and has been authorized for detection and/or diagnosis of SARS-CoV-2 by FDA under an Emergency Use Authorization (EUA). This EUA will remain in effect (meaning this test can be used) for the duration of the COVID-19 declaration under Section 564(b)(1) of the Act, 21 U.S.C. section 360bbb-3(b)(1), unless the authorization is terminated or revoked.  Performed at [facility], [place] [address], [place], [place] [address]   Urine Culture     Status: Abnormal (Preliminary result)   Collection Time: 03/24/22  7:19 PM   Specimen: In/Out Cath Urine  Result Value Ref Range Status   Specimen Description IN/OUT CATH URINE  Final   Special Requests NONE  Final   Culture (A)  Final    >=100,000 COLONIES/mL ESCHERICHIA COLI SUSCEPTIBILITIES TO FOLLOW Performed at [facility], [place] [address] [place], [place] [address]    Report Status PENDING  Incomplete  Blood Culture (routine x 2)     Status: None (Preliminary result)   Collection Time: 03/24/22  7:36 PM   Specimen: BLOOD  Result Value Ref Range Status   Specimen Description BLOOD BLOOD RIGHT WRIST  Final   Special Requests   Final    BOTTLES DRAWN AEROBIC AND ANAEROBIC Blood Culture adequate volume   Culture   Final    NO GROWTH 2 DAYS Performed at [facility], [place] [address], [place], [place] [address]    Report Status PENDING  Incomplete  Blood Culture (routine x 2)     Status: None (Preliminary result)   Collection Time: 03/25/22  2:53 AM   Specimen: BLOOD RIGHT HAND  Result Value Ref Range Status   Specimen Description BLOOD RIGHT HAND  Final   Special Requests   Final    AEROBIC BOTTLE ONLY Blood Culture results may not be optimal due to an inadequate volume of blood received in culture bottles   Culture   Final     NO GROWTH 1 DAY Performed at [facility], [place] [address] [place], [place] [address]    Report Status PENDING  Incomplete     Time coordinating discharge: Over 30 minutes  SIGNED:   Phillips Climes, MD  [facility] 03/26/2022,  2:51 PM Pager   If 7PM-7AM, please contact night-coverage [email or web address] Password [email or web address]

## 2022-03-26 NOTE — TOC Transition Note (Signed)
Transition of Care [facility]) - CM/SW Discharge Note   Patient Details  Name: Sarah Cook MRN: [id number] Date of Birth: Oct 25, 1951  Transition of Care [facility] CM/SW Contact:  Bary Castilla, LCSW Phone Number:[phone] 03/26/2022, 3:05 PM   Clinical Narrative:     Patient will DC to:?[facility] Anticipated DC date:?03/26/2022 Family notified:?[name] OI:NOMV   Per MD patient ready for DC to [facility].RN, patient, patient's family, and facility notified of DC. Discharge Summary sent to facility with pt. RN given number for report   call Summer at [phone].. DC packet on chart. It was agreed that pt's medication would be sent to the [facility] on [facility].Ambulance transport requested for patient.   CSW signing off.   [place], [place] [phone]         Patient Goals and CMS Choice        Discharge Placement                       Discharge Plan and Services                                     Social Determinants of Health (SDOH) Interventions     Readmission Risk Interventions    01/26/2022    2:21 PM  Readmission Risk Prevention Plan  Transportation Screening Complete  PCP or Specialist Appt within 3-5 Days Complete  HRI or [place] Complete  Social Work Consult for [place] Planning/Counseling Complete  Palliative Care Screening Not Applicable  Medication Review Press photographer) Complete

## 2022-03-26 NOTE — Discharge Instructions (Signed)
Follow with Primary MD Sarah Caraway, MD in 7 days   Get CBC, CMP, checked  by Primary MD next visit.    Activity: As tolerated with Full fall precautions use walker/cane & assistance as needed   Disposition Home    Diet:Regular Diet  On your next visit with your primary care physician please Get Medicines reviewed and adjusted.   Please request your Prim.MD to go over all Hospital Tests and Procedure/Radiological results at the follow up, please get all Hospital records sent to your Prim MD by signing hospital release before you go home.   If you experience worsening of your admission symptoms, develop shortness of breath, life threatening emergency, suicidal or homicidal thoughts you must seek medical attention immediately by calling 911 or calling your MD immediately  if symptoms less severe.  You Must read complete instructions/literature along with all the possible adverse reactions/side effects for all the Medicines you take and that have been prescribed to you. Take any new Medicines after you have completely understood and accpet all the possible adverse reactions/side effects.   Do not drive, operating heavy machinery, perform activities at heights, swimming or participation in water activities or provide baby sitting services if your were admitted for syncope or siezures until you have seen by Primary MD or a Neurologist and advised to do so again.  Do not drive when taking Pain medications.    Do not take more than prescribed Pain, Sleep and Anxiety Medications  Special Instructions: If you have smoked or chewed Tobacco  in the last 2 yrs please stop smoking, stop any regular Alcohol  and or any Recreational drug use.  Wear Seat belts while driving.   Please note  You were cared for by a hospitalist during your hospital stay. If you have any questions about your discharge medications or the care you received while you were in the hospital after you are discharged, you  can call the unit and asked to speak with the hospitalist on call if the hospitalist that took care of you is not available. Once you are discharged, your primary care physician will handle any further medical issues. Please note that NO REFILLS for any discharge medications will be authorized once you are discharged, as it is imperative that you return to your primary care physician (or establish a relationship with a primary care physician if you do not have one) for your aftercare needs so that they can reassess your need for medications and monitor your lab values.

## 2022-03-26 NOTE — Progress Notes (Signed)
OT Cancellation Note  Patient Details Name: Sarah Cook MRN: [id number] DOB: January 28, 1951   Cancelled Treatment:    Reason Eval/Treat Not Completed: Other (comment) Noted pt from memory care SNF.  Will defer OT eval to facility if appropriate Kari Baars, [place] [phone] Office- [phone], Thereasa Parkin 03/26/2022, 12:05 PM

## 2022-03-26 NOTE — Evaluation (Signed)
Physical Therapy Evaluation Patient Details Name: Sarah Cook MRN: [id number] DOB: 07/16/1951 Today's Date: 03/26/2022  History of Present Illness  71 year old female adm 5/18 with acute metabolic encephalopathy from ALF, covid +. PMHx: dementia, diastolic CHF, HTN, HLD, anemia, breast CA, hypercalcemia and hypokalemia  Clinical Impression  Pt pleasantly confused unaware of when she had fx and cast of LUE. Pt unable to provide PLOF and pulled from prior admission without family present to confirm cognition at baseline or physical assist. Pt able to walk in hall with min assist and if ALF can provide supervision for all mobility and assist then return to aLF with potential of HHPT if not currently at baseline is appropriate. Pt with impaired cognition, balance and gait and will benefit from acute therapy to maximize mobility and safety.   95% on RA     Recommendations for follow up therapy are one component of a multi-disciplinary discharge planning process, led by the attending physician.  Recommendations may be updated based on patient status, additional functional criteria and insurance authorization.  Follow Up Recommendations Home health PT (at ALF it not currently at baseline as pt could not confirm)    Assistance Recommended at Discharge Frequent or constant Supervision/Assistance  Patient can return home with the following  A little help with walking and/or transfers;A little help with bathing/dressing/bathroom;Assistance with cooking/housework;Direct supervision/assist for financial management;Direct supervision/assist for medications management;Assist for transportation    Equipment Recommendations None recommended by PT  Recommendations for Other Services       Functional Status Assessment Patient has had a recent decline in their functional status and/or demonstrates limited ability to make significant improvements in function in a reasonable and predictable amount of time      Precautions / Restrictions Precautions Precautions: Fall;Other (comment) Precaution Comments: LUE in cast and assume NWB, Covid prec Restrictions Weight Bearing Restrictions: Yes LUE Weight Bearing: Non weight bearing      Mobility  Bed Mobility Overal bed mobility: Needs Assistance Bed Mobility: Supine to Sit     Supine to sit: Min assist, HOB elevated     General bed mobility comments: HOB 20 degrees min assist to scoot hips to prevent pushing with LUE    Transfers Overall transfer level: Needs assistance   Transfers: Sit to/from Stand Sit to Stand: Min guard           General transfer comment: cues for hand placement    Ambulation/Gait Ambulation/Gait assistance: Min assist Gait Distance (Feet): 180 Feet Assistive device: IV Pole, None, 1 person hand held assist Gait Pattern/deviations: Step-through pattern, Decreased stride length, Shuffle   Gait velocity interpretation: <1.8 ft/sec, indicate of risk for recurrent falls   General Gait Details: pt with shuffling gait with tendency for flexed posture with cues for posture and increased stride. Pt initially without UE support but with increased distance utilized IV pole and single UE support to maintain balance with fatigue  Stairs            Wheelchair Mobility    Modified Rankin (Stroke Patients Only)       Balance Overall balance assessment: Needs assistance Sitting-balance support: Feet supported, No upper extremity supported Sitting balance-Leahy Scale: Fair Sitting balance - Comments: Pt with static sitting without UE support     Standing balance-Leahy Scale: Fair Standing balance comment: pt able to stand and take steps without UE support with balance deficits evident  Pertinent Vitals/Pain Pain Assessment Pain Assessment: No/denies pain    Home Living Family/patient expects to be discharged to:: Assisted living                  Home Equipment: Conservation officer, nature (2 wheels);Cane - single point;Shower seat;Wheelchair - manual Additional Comments: info was taken from previous admissions as patient was unreliable source of PLOF    Prior Function Prior Level of Function : Needs assist  Cognitive Assist : Mobility (cognitive)           Mobility Comments: ambulatory with RW prior admission ADLs Comments: assist required with caregiver assistance for all ADLs and ambulation     Hand Dominance        Extremity/Trunk Assessment   Upper Extremity Assessment Upper Extremity Assessment: Generalized weakness;LUE deficits/detail LUE Deficits / Details: in cast    Lower Extremity Assessment Lower Extremity Assessment: Generalized weakness    Cervical / Trunk Assessment Cervical / Trunk Assessment: Normal  Communication   Communication: No difficulties  Cognition Arousal/Alertness: Awake/alert Behavior During Therapy: WFL for tasks assessed/performed Overall Cognitive Status: No family/caregiver present to determine baseline cognitive functioning                                 General Comments: pt with hx of dementia at memory care. Unsure how or when she broke her arm. Aware she was in the hospital but not which one or why. Oriented to self not date        General Comments      Exercises     Assessment/Plan    PT Assessment Patient needs continued PT services  PT Problem List Decreased strength;Decreased mobility;Decreased safety awareness;Decreased activity tolerance;Decreased cognition;Decreased balance;Decreased knowledge of use of DME       PT Treatment Interventions DME instruction;Therapeutic exercise;Gait training;Balance training;Functional mobility training;Cognitive remediation;Therapeutic activities;Patient/family education    PT Goals (Current goals can be found in the Care Plan section)  Acute Rehab PT Goals PT Goal Formulation: Patient unable to participate in goal  setting Time For Goal Achievement: 04/09/22 Potential to Achieve Goals: Fair    Frequency Min 2X/week     Co-evaluation               AM-PAC PT "6 Clicks" Mobility  Outcome Measure Help needed turning from your back to your side while in a flat bed without using bedrails?: A Little Help needed moving from lying on your back to sitting on the side of a flat bed without using bedrails?: A Little Help needed moving to and from a bed to a chair (including a wheelchair)?: A Little Help needed standing up from a chair using your arms (e.g., wheelchair or bedside chair)?: A Little Help needed to walk in hospital room?: A Little Help needed climbing 3-5 steps with a railing? : A Lot 6 Click Score: 17    End of Session Equipment Utilized During Treatment: Gait belt Activity Tolerance: Patient tolerated treatment well Patient left: in chair;with call bell/phone within reach;with chair alarm set Nurse Communication: Mobility status PT Visit Diagnosis: Other abnormalities of gait and mobility (R26.89);Difficulty in walking, not elsewhere classified (R26.2);Muscle weakness (generalized) (M62.81)    Time: 1250-1310 PT Time Calculation (min) (ACUTE ONLY): 20 min   Charges:   PT Evaluation $PT Eval Moderate Complexity: 1 Mod          Naasia Weilbacher P, PT Acute Rehabilitation Services Pager: [phone] Office: [phone]  Jyasia Markoff B Florena Kozma 03/26/2022, 2:08 PM

## 2022-03-26 NOTE — Plan of Care (Signed)
  Problem: Education: Goal: Knowledge of risk factors and measures for prevention of condition will improve Outcome: Progressing   Problem: Coping: Goal: Psychosocial and spiritual needs will be supported Outcome: Progressing   Problem: Respiratory: Goal: Will maintain a patent airway Outcome: Progressing   Problem: Respiratory: Goal: Complications related to the disease process, condition or treatment will be avoided or minimized Outcome: Progressing

## 2022-03-27 LAB — URINE CULTURE: Culture: >=100000 — AB

## 2022-03-28 LAB — CALCIUM, IONIZED: Calcium, Ionized, Serum: 5.9 mg/dL — ABNORMAL HIGH (ref 4.5–5.6)

## 2022-03-28 NOTE — ED Provider Notes (Addendum)
[facility] Provider Note   CSN: [id number] Arrival date & time: 03/24/22  1847     History  Chief Complaint  Patient presents with   Altered Mental Status    Sarah Cook is a 71 y.o. female.  Patient with history of hypertension.  She presents from the nursing home with fever and weakness  The history is provided by the patient and medical records. No language interpreter was used.  Altered Mental Status Presenting symptoms: lethargy   Severity:  Mild Most recent episode:  Today Episode history:  Continuous Timing:  Constant Progression:  Worsening Chronicity:  New Context: not alcohol use   Associated symptoms: fever   Associated symptoms: no abdominal pain, no hallucinations, no headaches, no rash and no seizures       Home Medications Prior to Admission medications   Medication Sig Start Date End Date Taking? Authorizing Provider  apixaban (ELIQUIS) 5 MG TABS tablet Take 1 tablet (5 mg total) by mouth 2 (two) times daily. 12/07/21  Yes Mercy Riding, MD  cephALEXin (KEFLEX) 500 MG capsule Take 1 capsule (500 mg total) by mouth 3 (three) times daily for 2 days. 03/27/22 03/29/22 Yes Elgergawy, Silver Huguenin, MD  cholecalciferol (VITAMIN D) 25 MCG (1000 UNIT) tablet Take 2,000 Units by mouth daily.   Yes [provider]  Coenzyme Q10 (CO Q 10) 100 MG CAPS Take 100 mg by mouth every evening.   Yes [provider]  docusate sodium (COLACE) 100 MG capsule Take 100 mg by mouth every evening.   Yes [provider]  Lactobacillus-Inulin (CULTURELLE DIGESTIVE DAILY PO) Take 1 capsule by mouth daily.   Yes [provider]  lamoTRIgine (LAMICTAL) 100 MG tablet Take 1 tablet (100 mg total) by mouth 2 (two) times daily. 06/23/21  Yes Marcial Pacas, MD  magnesium oxide (MAG-OX) 400 MG tablet Take 400 mg by mouth every evening.   Yes [provider]  potassium chloride SA (KLOR-CON M) 20 MEQ tablet Take 1 tablet (20 mEq total)  by mouth 2 (two) times daily for 15 days. 01/27/22 05/14/22 Yes Aline August, MD  rosuvastatin (CRESTOR) 10 MG tablet Take 10 mg by mouth daily. 06/18/21  Yes [provider]  saccharomyces boulardii (FLORASTOR) 250 MG capsule Take 250 mg by mouth daily.   Yes [provider]  vitamin C (ASCORBIC ACID) 500 MG tablet Take 1,000 mg by mouth daily.   Yes [provider]  acetaminophen (TYLENOL) 325 MG tablet Take 2 tablets (650 mg total) by mouth every 6 (six) hours as needed for mild pain, fever or headache (or Fever >/= 101). 03/26/22   Elgergawy, Silver Huguenin, MD  feeding supplement (ENSURE ENLIVE / ENSURE PLUS) LIQD Take 237 mLs by mouth 2 (two) times daily between meals. Patient not taking: Reported on 03/25/2022 10/21/21   Flora Lipps, MD  hydrALAZINE (APRESOLINE) 50 MG tablet Take 1 tablet (50 mg total) by mouth 3 (three) times daily with meals. 04/01/22   Elgergawy, Silver Huguenin, MD  irbesartan (AVAPRO) 300 MG tablet Take 1 tablet (300 mg total) by mouth daily. 03/30/22   Elgergawy, Silver Huguenin, MD  molnupiravir EUA (LAGEVRIO) 200 mg CAPS capsule Take 4 capsules (800 mg total) by mouth 2 (two) times daily for 5 days. 03/26/22 03/31/22  Elgergawy, Silver Huguenin, MD  Multiple Vitamin (MULTI VITAMIN DAILY PO) Take 1 tablet by mouth daily. Patient not taking: Reported on 03/25/2022    [provider]  phosphorus (  K PHOS NEUTRAL) 155-852-130 MG tablet Take 1 tablet (250 mg total) by mouth 3 (three) times daily for 3 days. 03/26/22 03/29/22  Elgergawy, Silver Huguenin, MD      Allergies    Anesthetics, ester; Codeine; Tape; Hydrochlorothiazide; Hydrocodone; Norvasc [amlodipine]; Zyrtec [cetirizine hcl]; and Latex    Review of Systems   Review of Systems  Constitutional:  Positive for fatigue and fever. Negative for appetite change.  HENT:  Negative for congestion, ear discharge and sinus pressure.   Eyes:  Negative for discharge.  Respiratory:  Negative for cough.   Cardiovascular:   Negative for chest pain.  Gastrointestinal:  Negative for abdominal pain and diarrhea.  Genitourinary:  Negative for frequency and hematuria.  Musculoskeletal:  Negative for back pain.  Skin:  Negative for rash.  Neurological:  Negative for seizures and headaches.  Psychiatric/Behavioral:  Negative for hallucinations.    Physical Exam Updated Vital Signs BP 125/74 (BP Location: Right Arm)   Pulse 73   Temp 98.3 F (36.8 C) (Axillary)   Resp 14   Ht '5\' 1"'$  (1.549 m)   Wt 62.1 kg   SpO2 95%   BMI 25.87 kg/m  Physical Exam Vitals and nursing note reviewed.  Constitutional:      Appearance: She is well-developed.  HENT:     Head: Normocephalic.     Nose: Nose normal.  Eyes:     General: No scleral icterus.    Conjunctiva/sclera: Conjunctivae normal.  Neck:     Thyroid: No thyromegaly.  Cardiovascular:     Rate and Rhythm: Normal rate and regular rhythm.     Heart sounds: No murmur heard.   No friction rub. No gallop.  Pulmonary:     Breath sounds: No stridor. No wheezing or rales.  Chest:     Chest wall: No tenderness.  Abdominal:     General: There is no distension.     Tenderness: There is no abdominal tenderness. There is no rebound.  Musculoskeletal:        General: Normal range of motion.     Cervical back: Neck supple.  Lymphadenopathy:     Cervical: No cervical adenopathy.  Skin:    Findings: No erythema or rash.  Neurological:     Mental Status: She is alert and oriented to person, place, and time.     Motor: No abnormal muscle tone.     Coordination: Coordination normal.  Psychiatric:        Behavior: Behavior normal.    ED Results / Procedures / Treatments   Labs (all labs ordered are listed, but only abnormal results are displayed) Labs Reviewed  RESP PANEL BY RT-PCR (FLU A&B, COVID) ARPGX2 - Abnormal; Notable for the following components:      Result Value   SARS Coronavirus 2 by RT PCR POSITIVE (*)    All other components within normal limits   URINE CULTURE - Abnormal; Notable for the following components:   Culture >=100,000 COLONIES/mL ESCHERICHIA COLI (*)    Organism ID, Bacteria ESCHERICHIA COLI (*)    All other components within normal limits  COMPREHENSIVE METABOLIC PANEL - Abnormal; Notable for the following components:   Sodium 134 (*)    CO2 21 (*)    Glucose, Bld 155 (*)    Total Protein 6.4 (*)    Total Bilirubin 1.5 (*)    All other components within normal limits  CBC WITH DIFFERENTIAL/PLATELET - Abnormal; Notable for the following components:   RBC 3.63 (*)  Hemoglobin 11.7 (*)    HCT 34.5 (*)    Lymphs Abs 0.4 (*)    All other components within normal limits  URINALYSIS, ROUTINE W REFLEX MICROSCOPIC - Abnormal; Notable for the following components:   Color, Urine STRAW (*)    Leukocytes,Ua SMALL (*)    Bacteria, UA RARE (*)    All other components within normal limits  MAGNESIUM - Abnormal; Notable for the following components:   Magnesium 1.6 (*)    All other components within normal limits  COMPREHENSIVE METABOLIC PANEL - Abnormal; Notable for the following components:   Chloride 113 (*)    Glucose, Bld 105 (*)    Total Protein 5.3 (*)    Albumin 2.9 (*)    Alkaline Phosphatase 37 (*)    All other components within normal limits  CBC WITH DIFFERENTIAL/PLATELET - Abnormal; Notable for the following components:   RBC 3.12 (*)    Hemoglobin 10.0 (*)    HCT 30.0 (*)    All other components within normal limits  CALCIUM, IONIZED - Abnormal; Notable for the following components:   Calcium, Ionized, Serum 5.9 (*)    All other components within normal limits  BLOOD GAS, VENOUS - Abnormal; Notable for the following components:   pH, Ven 7.51 (*)    pCO2, Ven 28 (*)    pO2, Ven 179 (*)    All other components within normal limits  C-REACTIVE PROTEIN - Abnormal; Notable for the following components:   CRP 5.7 (*)    All other components within normal limits  PHOSPHORUS - Abnormal; Notable for the  following components:   Phosphorus 2.4 (*)    All other components within normal limits  BASIC METABOLIC PANEL - Abnormal; Notable for the following components:   Chloride 112 (*)    CO2 20 (*)    All other components within normal limits  CBC - Abnormal; Notable for the following components:   WBC 3.8 (*)    RBC 3.02 (*)    Hemoglobin 9.7 (*)    HCT 28.3 (*)    All other components within normal limits  PHOSPHORUS - Abnormal; Notable for the following components:   Phosphorus 2.3 (*)    All other components within normal limits  CULTURE, BLOOD (ROUTINE X 2)  CULTURE, BLOOD (ROUTINE X 2)  LACTIC ACID, PLASMA  PROTIME-INR  APTT  MAGNESIUM  TSH  PROCALCITONIN  MAGNESIUM  METHYLMALONIC ACID, SERUM    EKG EKG Interpretation  Date/Time:  Thursday Mar 24 2022 19:27:22 EDT Ventricular Rate:  98 PR Interval:  142 QRS Duration: 89 QT Interval:  344 QTC Calculation: 440 R Axis:   76 Text Interpretation: Sinus rhythm Confirmed by Milton Ferguson [phone]) on 03/24/2022 10:19:05 PM  Radiology No results found.  Procedures Procedures    Medications Ordered in ED Medications  sodium chloride 0.9 % bolus 1,000 mL (0 mLs Intravenous Stopped 03/24/22 2217)  acetaminophen (TYLENOL) tablet 650 mg (650 mg Oral Given 03/24/22 2019)  vancomycin (VANCOREADY) IVPB 1750 mg/350 mL (0 mg Intravenous Stopped 03/25/22 0323)  magnesium sulfate IVPB 2 g 50 mL (0 g Intravenous Stopped 03/25/22 1532)  sodium phosphate 15 mmol in dextrose 5 % 250 mL infusion (0 mmol Intravenous Stopped 03/25/22 2023)    ED Course/ Medical Decision Making/ A&P  CRITICAL CARE Performed by: Milton Ferguson Total critical care time: 35 minutes Critical care time was exclusive of separately billable procedures and treating other patients. Critical care was necessary to treat or prevent  imminent or life-threatening deterioration. Critical care was time spent personally by me on the following activities: development of  treatment plan with patient and/or surrogate as well as nursing, discussions with consultants, evaluation of patient's response to treatment, examination of patient, obtaining history from patient or surrogate, ordering and performing treatments and interventions, ordering and review of laboratory studies, ordering and review of radiographic studies, pulse oximetry and re-evaluation of patient's condition.                          Medical Decision Making Amount and/or Complexity of Data Reviewed Labs: ordered. Radiology: ordered. ECG/medicine tests: ordered.  Risk OTC drugs. Decision regarding hospitalization.  This patient presents to the ED for concern of fever, this involves an extensive number of treatment options, and is a complaint that carries with it a high risk of complications and morbidity.  The differential diagnosis includes UTI   Co morbidities that complicate the patient evaluation  Dementia   Additional history obtained:  Additional history obtained from nursing home record External records from outside source obtained and reviewed including hospital record   Lab Tests:  I Ordered, and personally interpreted labs.  The pertinent results include: Chemistries unremarkable, CBC shows hemoglobin 10, urinalysis shows 6-10 WBCs and COVID-positive   Imaging Studies ordered:  I ordered imaging studies including chest x-ray I independently visualized and interpreted imaging which showed negative I agree with the radiologist interpretation   Cardiac Monitoring: / EKG:  The patient was maintained on a cardiac monitor.  I personally viewed and interpreted the cardiac monitored which showed an underlying rhythm of: Normal sinus rhythm   Consultations Obtained:  I requested consultation with the hospitalist,  and discussed lab and imaging findings as well as pertinent plan - they recommend: Admission   Problem List / ED Course / Critical interventions / Medication  management  Fever, UTI, dementia I ordered medication including Vanco for possible sepsis Reevaluation of the patient after these medicines showed that the patient stayed the same I have reviewed the patients home medicines and have made adjustments as needed   Social Determinants of Health:  Dementia lives in nursing home   Test / Admission - Considered:  Patient will be admitted and treated for COVID and UTI  Patient with febrile illness and weakness with positive COVID and possible UTI.  She will be admitted to medicine        Final Clinical Impression(s) / ED Diagnoses Final diagnoses:  None    Rx / DC Orders ED Discharge Orders          Ordered    hydrALAZINE (APRESOLINE) 50 MG tablet  3 times daily with meals        03/26/22 1442    irbesartan (AVAPRO) 300 MG tablet  Daily        03/26/22 1442    cephALEXin (KEFLEX) 500 MG capsule  3 times daily        03/26/22 1442    acetaminophen (TYLENOL) 325 MG tablet  Every 6 hours PRN        03/26/22 1442    molnupiravir EUA (LAGEVRIO) 200 mg CAPS capsule  2 times daily        03/26/22 1442    Increase activity slowly        03/26/22 1442    Diet - low sodium heart healthy        03/26/22 1442    Discharge instructions  Comments: Follow with Primary MD Cari Caraway, MD in 7 days   Get CBC, CMP, checked  by Primary MD next visit.    Activity: As tolerated with Full fall precautions use walker/cane & assistance as needed   Disposition Home    Diet:Regular Diet  On your next visit with your primary care physician please Get Medicines reviewed and adjusted.   Please request your Prim.MD to go over all Hospital Tests and Procedure/Radiological results at the follow up, please get all Hospital records sent to your Prim MD by signing hospital release before you go home.   If you experience worsening of your admission symptoms, develop shortness of breath, life threatening emergency, suicidal or  homicidal thoughts you must seek medical attention immediately by calling 911 or calling your MD immediately  if symptoms less severe.  You Must read complete instructions/literature along with all the possible adverse reactions/side effects for all the Medicines you take and that have been prescribed to you. Take any new Medicines after you have completely understood and accpet all the possible adverse reactions/side effects.   Do not drive, operating heavy machinery, perform activities at heights, swimming or participation in water activities or provide baby sitting services if your were admitted for syncope or siezures until you have seen by Primary MD or a Neurologist and advised to do so again.  Do not drive when taking Pain medications.    Do not take more than prescribed Pain, Sleep and Anxiety Medications  Special Instructions: If you have smoked or chewed Tobacco  in the last 2 yrs please stop smoking, stop any regular Alcohol  and or any Recreational drug use.  Wear Seat belts while driving.   Please note  You were cared for by a hospitalist during your hospital stay. If you have any questions about your discharge medications or the care you received while you were in the hospital after you are discharged, you can call the unit and asked to speak with the hospitalist on call if the hospitalist that took care of you is not available. Once you are discharged, your primary care physician will handle any further medical issues. Please note that NO REFILLS for any discharge medications will be authorized once you are discharged, as it is imperative that you return to your primary care physician (or establish a relationship with a primary care physician if you do not have one) for your aftercare needs so that they can reassess your need for medications and monitor your lab values.   03/26/22 1442    phosphorus (K PHOS NEUTRAL) 155-852-130 MG tablet  3 times daily        03/26/22 1442     Increase activity slowly        03/26/22 1442    Diet - low sodium heart healthy        03/26/22 1442    Discharge instructions       Comments: Follow with Primary MD Cari Caraway, MD in 7 days   Get CBC, CMP, 2 view Chest X ray checked  by Primary MD next visit.    Activity: As tolerated with Full fall precautions use walker/cane & assistance as needed   Disposition Memory care unit    Diet: Regular diet   On your next visit with your primary care physician please Get Medicines reviewed and adjusted.   Please request your Prim.MD to go over all Hospital Tests and Procedure/Radiological results at the follow up, please get all Hospital records  sent to your Prim MD by signing hospital release before you go home.   If you experience worsening of your admission symptoms, develop shortness of breath, life threatening emergency, suicidal or homicidal thoughts you must seek medical attention immediately by calling 911 or calling your MD immediately  if symptoms less severe.  You Must read complete instructions/literature along with all the possible adverse reactions/side effects for all the Medicines you take and that have been prescribed to you. Take any new Medicines after you have completely understood and accpet all the possible adverse reactions/side effects.   Do not drive, operating heavy machinery, perform activities at heights, swimming or participation in water activities or provide baby sitting services if your were admitted for syncope or siezures until you have seen by Primary MD or a Neurologist and advised to do so again.  Do not drive when taking Pain medications.    Do not take more than prescribed Pain, Sleep and Anxiety Medications  Special Instructions: If you have smoked or chewed Tobacco  in the last 2 yrs please stop smoking, stop any regular Alcohol  and or any Recreational drug use.  Wear Seat belts while driving.   Please note  You were cared for by a  hospitalist during your hospital stay. If you have any questions about your discharge medications or the care you received while you were in the hospital after you are discharged, you can call the unit and asked to speak with the hospitalist on call if the hospitalist that took care of you is not available. Once you are discharged, your primary care physician will handle any further medical issues. Please note that NO REFILLS for any discharge medications will be authorized once you are discharged, as it is imperative that you return to your primary care physician (or establish a relationship with a primary care physician if you do not have one) for your aftercare needs so that they can reassess your need for medications and monitor your lab values.   03/26/22 1442              Milton Ferguson, MD 03/28/22 4742    Milton Ferguson, MD 03/28/22 [phone]

## 2022-03-29 LAB — CULTURE, BLOOD (ROUTINE X 2)
Culture: NO GROWTH
Special Requests: ADEQUATE

## 2022-03-30 DIAGNOSIS — A4189 Other specified sepsis: Secondary | ICD-10-CM | POA: Diagnosis not present

## 2022-03-30 DIAGNOSIS — G9341 Metabolic encephalopathy: Secondary | ICD-10-CM | POA: Diagnosis not present

## 2022-03-30 DIAGNOSIS — N390 Urinary tract infection, site not specified: Secondary | ICD-10-CM | POA: Diagnosis not present

## 2022-03-30 DIAGNOSIS — U071 COVID-19: Secondary | ICD-10-CM | POA: Diagnosis not present

## 2022-03-30 LAB — CULTURE, BLOOD (ROUTINE X 2): Culture: NO GROWTH

## 2022-03-30 LAB — METHYLMALONIC ACID, SERUM: Methylmalonic Acid, Quantitative: 103 nmol/L (ref 0–378)

## 2022-04-01 DIAGNOSIS — R0602 Shortness of breath: Secondary | ICD-10-CM | POA: Diagnosis not present

## 2022-04-08 DIAGNOSIS — G9341 Metabolic encephalopathy: Secondary | ICD-10-CM | POA: Diagnosis not present

## 2022-04-08 DIAGNOSIS — S42402D Unspecified fracture of lower end of left humerus, subsequent encounter for fracture with routine healing: Secondary | ICD-10-CM | POA: Diagnosis not present

## 2022-04-13 DIAGNOSIS — E876 Hypokalemia: Secondary | ICD-10-CM | POA: Diagnosis not present

## 2022-04-13 DIAGNOSIS — I5032 Chronic diastolic (congestive) heart failure: Secondary | ICD-10-CM | POA: Diagnosis not present

## 2022-04-13 DIAGNOSIS — R911 Solitary pulmonary nodule: Secondary | ICD-10-CM | POA: Diagnosis not present

## 2022-04-26 DIAGNOSIS — B351 Tinea unguium: Secondary | ICD-10-CM | POA: Diagnosis not present

## 2022-04-26 DIAGNOSIS — I7091 Generalized atherosclerosis: Secondary | ICD-10-CM | POA: Diagnosis not present

## 2022-04-28 DIAGNOSIS — I10 Essential (primary) hypertension: Secondary | ICD-10-CM | POA: Diagnosis not present

## 2022-05-18 DIAGNOSIS — G9389 Other specified disorders of brain: Secondary | ICD-10-CM | POA: Diagnosis not present

## 2022-05-18 DIAGNOSIS — E876 Hypokalemia: Secondary | ICD-10-CM | POA: Diagnosis not present

## 2022-05-18 DIAGNOSIS — M6281 Muscle weakness (generalized): Secondary | ICD-10-CM | POA: Diagnosis not present

## 2022-05-18 DIAGNOSIS — I5032 Chronic diastolic (congestive) heart failure: Secondary | ICD-10-CM | POA: Diagnosis not present

## 2022-06-29 DIAGNOSIS — G9389 Other specified disorders of brain: Secondary | ICD-10-CM | POA: Diagnosis not present

## 2022-06-29 DIAGNOSIS — I5032 Chronic diastolic (congestive) heart failure: Secondary | ICD-10-CM | POA: Diagnosis not present

## 2022-06-29 DIAGNOSIS — N3289 Other specified disorders of bladder: Secondary | ICD-10-CM | POA: Diagnosis not present

## 2022-06-29 DIAGNOSIS — R32 Unspecified urinary incontinence: Secondary | ICD-10-CM | POA: Diagnosis not present

## 2022-06-29 DIAGNOSIS — F0390 Unspecified dementia without behavioral disturbance: Secondary | ICD-10-CM | POA: Diagnosis not present

## 2022-06-30 DIAGNOSIS — R3915 Urgency of urination: Secondary | ICD-10-CM | POA: Diagnosis not present

## 2022-07-06 DIAGNOSIS — N390 Urinary tract infection, site not specified: Secondary | ICD-10-CM | POA: Diagnosis not present

## 2022-07-06 DIAGNOSIS — R4182 Altered mental status, unspecified: Secondary | ICD-10-CM | POA: Diagnosis not present

## 2022-07-06 DIAGNOSIS — N3289 Other specified disorders of bladder: Secondary | ICD-10-CM | POA: Diagnosis not present

## 2022-07-20 DIAGNOSIS — F0390 Unspecified dementia without behavioral disturbance: Secondary | ICD-10-CM | POA: Diagnosis not present

## 2022-07-20 DIAGNOSIS — G479 Sleep disorder, unspecified: Secondary | ICD-10-CM | POA: Diagnosis not present

## 2022-07-20 DIAGNOSIS — T148XXA Other injury of unspecified body region, initial encounter: Secondary | ICD-10-CM | POA: Diagnosis not present

## 2022-07-20 DIAGNOSIS — R4182 Altered mental status, unspecified: Secondary | ICD-10-CM | POA: Diagnosis not present

## 2022-07-27 DIAGNOSIS — F0390 Unspecified dementia without behavioral disturbance: Secondary | ICD-10-CM | POA: Diagnosis not present

## 2022-08-13 DIAGNOSIS — I7091 Generalized atherosclerosis: Secondary | ICD-10-CM | POA: Diagnosis not present

## 2022-08-13 DIAGNOSIS — B351 Tinea unguium: Secondary | ICD-10-CM | POA: Diagnosis not present

## 2022-09-08 DIAGNOSIS — I10 Essential (primary) hypertension: Secondary | ICD-10-CM | POA: Diagnosis not present

## 2022-09-08 DIAGNOSIS — E7849 Other hyperlipidemia: Secondary | ICD-10-CM | POA: Diagnosis not present

## 2022-09-08 DIAGNOSIS — E119 Type 2 diabetes mellitus without complications: Secondary | ICD-10-CM | POA: Diagnosis not present

## 2022-09-08 DIAGNOSIS — E876 Hypokalemia: Secondary | ICD-10-CM | POA: Diagnosis not present

## 2022-09-08 DIAGNOSIS — F0390 Unspecified dementia without behavioral disturbance: Secondary | ICD-10-CM | POA: Diagnosis not present

## 2022-09-14 DIAGNOSIS — E213 Hyperparathyroidism, unspecified: Secondary | ICD-10-CM | POA: Diagnosis not present

## 2022-09-14 DIAGNOSIS — E7849 Other hyperlipidemia: Secondary | ICD-10-CM | POA: Diagnosis not present

## 2022-09-14 DIAGNOSIS — I10 Essential (primary) hypertension: Secondary | ICD-10-CM | POA: Diagnosis not present

## 2022-09-14 DIAGNOSIS — F0390 Unspecified dementia without behavioral disturbance: Secondary | ICD-10-CM | POA: Diagnosis not present

## 2022-10-22 DIAGNOSIS — B351 Tinea unguium: Secondary | ICD-10-CM | POA: Diagnosis not present

## 2022-10-22 DIAGNOSIS — I7091 Generalized atherosclerosis: Secondary | ICD-10-CM | POA: Diagnosis not present

## 2022-11-09 DIAGNOSIS — R4701 Aphasia: Secondary | ICD-10-CM | POA: Diagnosis not present

## 2022-11-09 DIAGNOSIS — I5032 Chronic diastolic (congestive) heart failure: Secondary | ICD-10-CM | POA: Diagnosis not present

## 2022-11-09 DIAGNOSIS — F0390 Unspecified dementia without behavioral disturbance: Secondary | ICD-10-CM | POA: Diagnosis not present

## 2022-11-09 IMAGING — DX DG CHEST 1V
1 series · 1 of 1 positions shown · non-contrast
Comparison: January 24, 2022

CLINICAL DATA: Status post fall.

EXAM:
CHEST  1 VIEW

[chest]
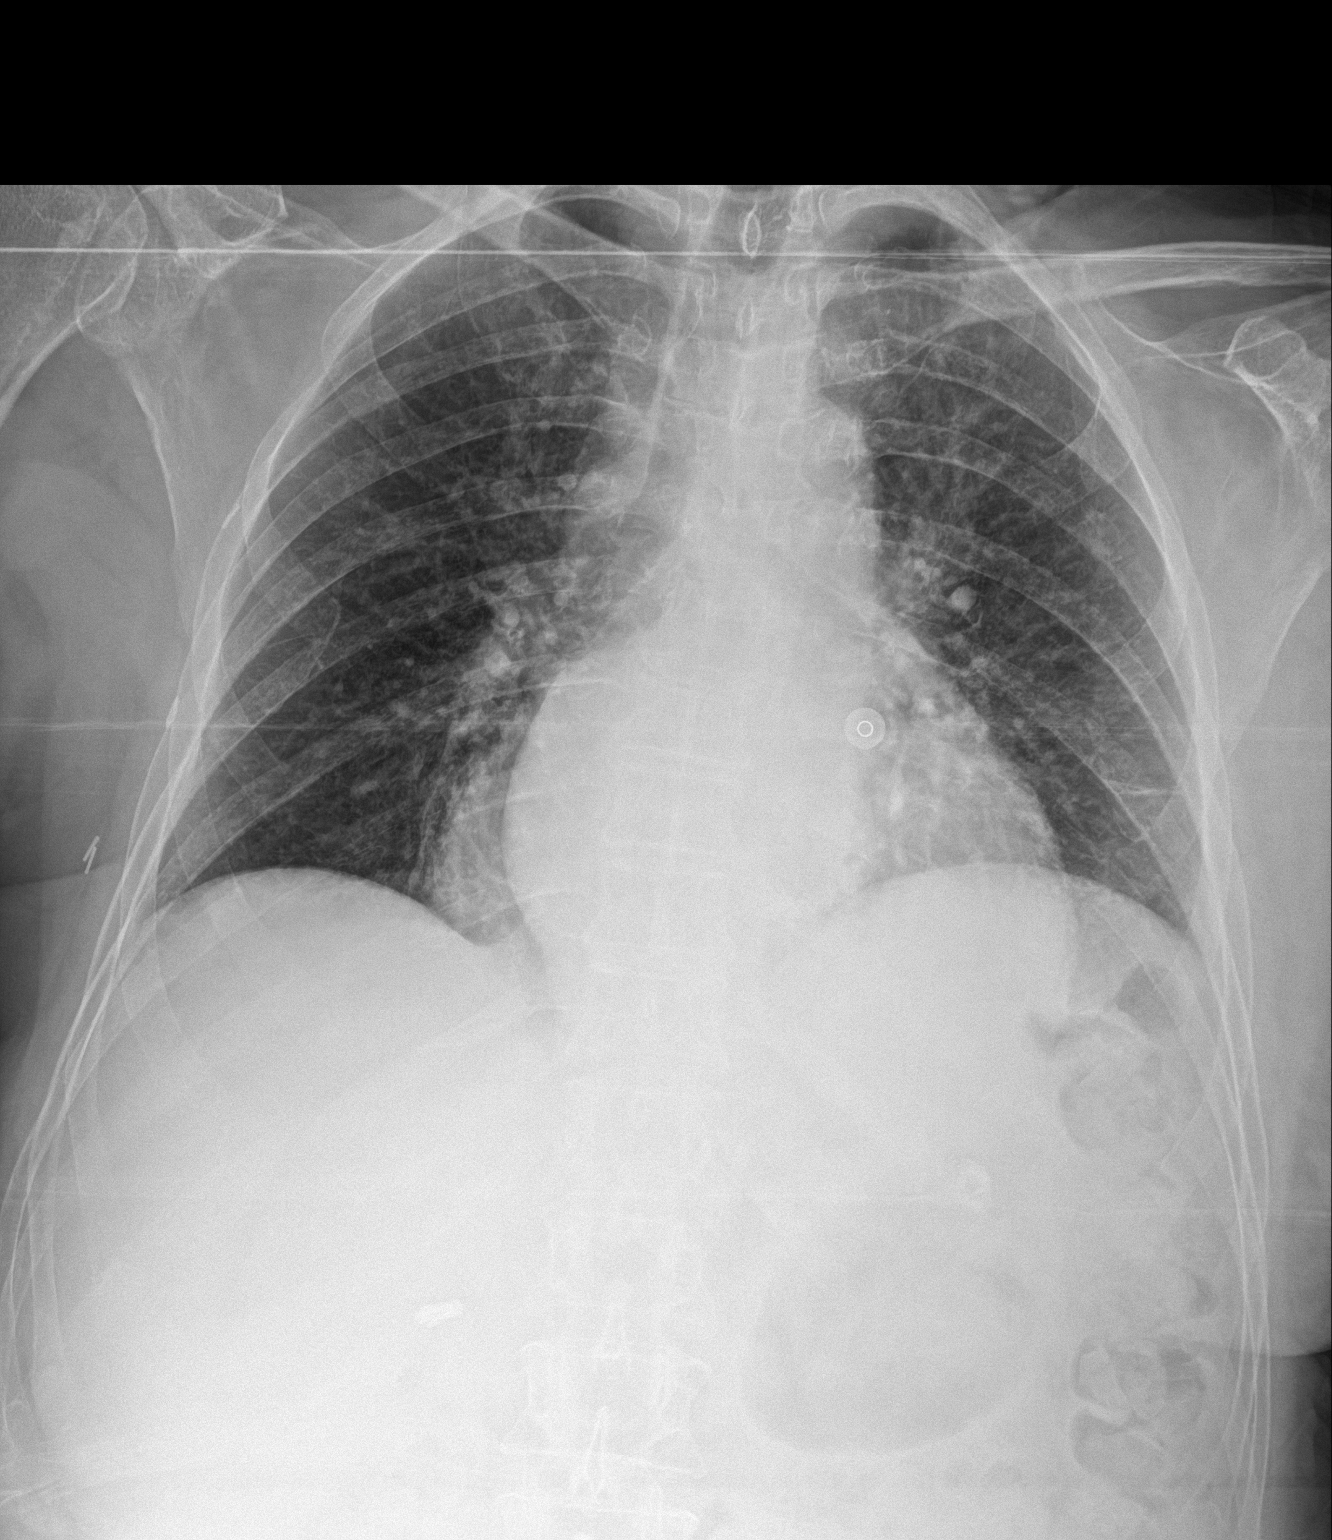

[1 of 1 positions shown; findings below may reference images not displayed]

FINDINGS: Decreased lung volumes are seen with mild linear atelectasis noted
within the left lung base. There is no evidence of acute infiltrate,
pleural effusion or pneumothorax. The heart size and mediastinal
contours are within normal limits. A hiatal hernia is suspected.
Radiopaque surgical clips are seen within the right upper quadrant.
The visualized skeletal structures are unremarkable.
IMPRESSION: Very mild left basilar linear atelectasis.

## 2022-11-09 IMAGING — CR DG SHOULDER 2+V*L*
3 series · 3 of 3 positions shown · non-contrast
Comparison: December 19, 2017

CLINICAL DATA: Status post fall.

EXAM:
LEFT SHOULDER - 2+ VIEW

[shoulder grashey]
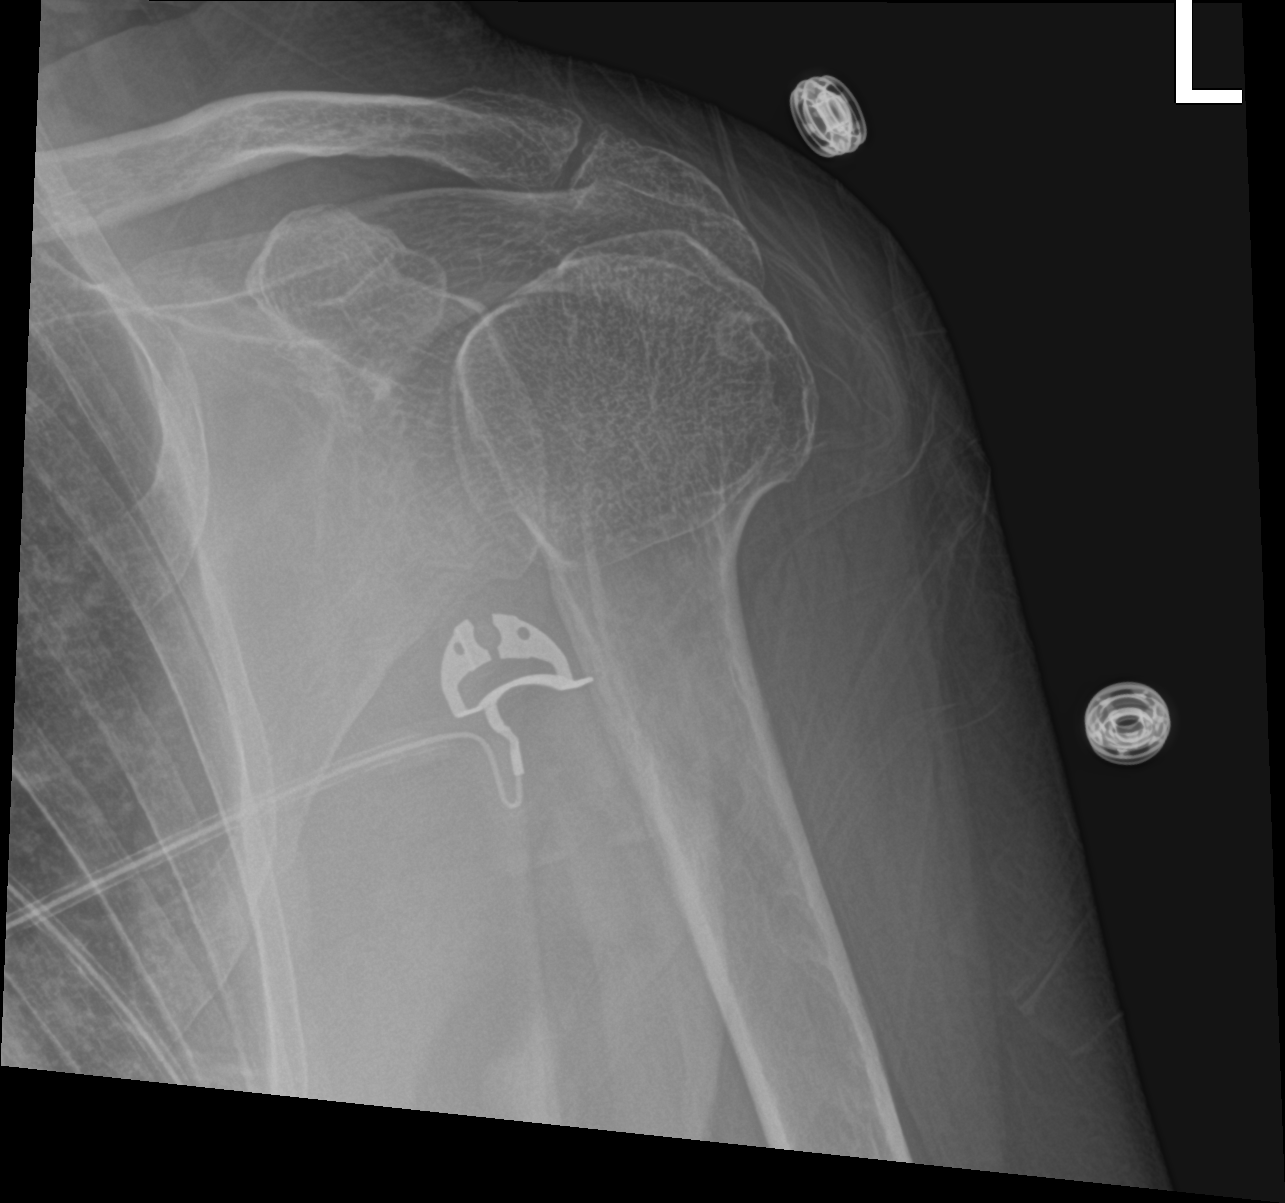

[shoulder y view]
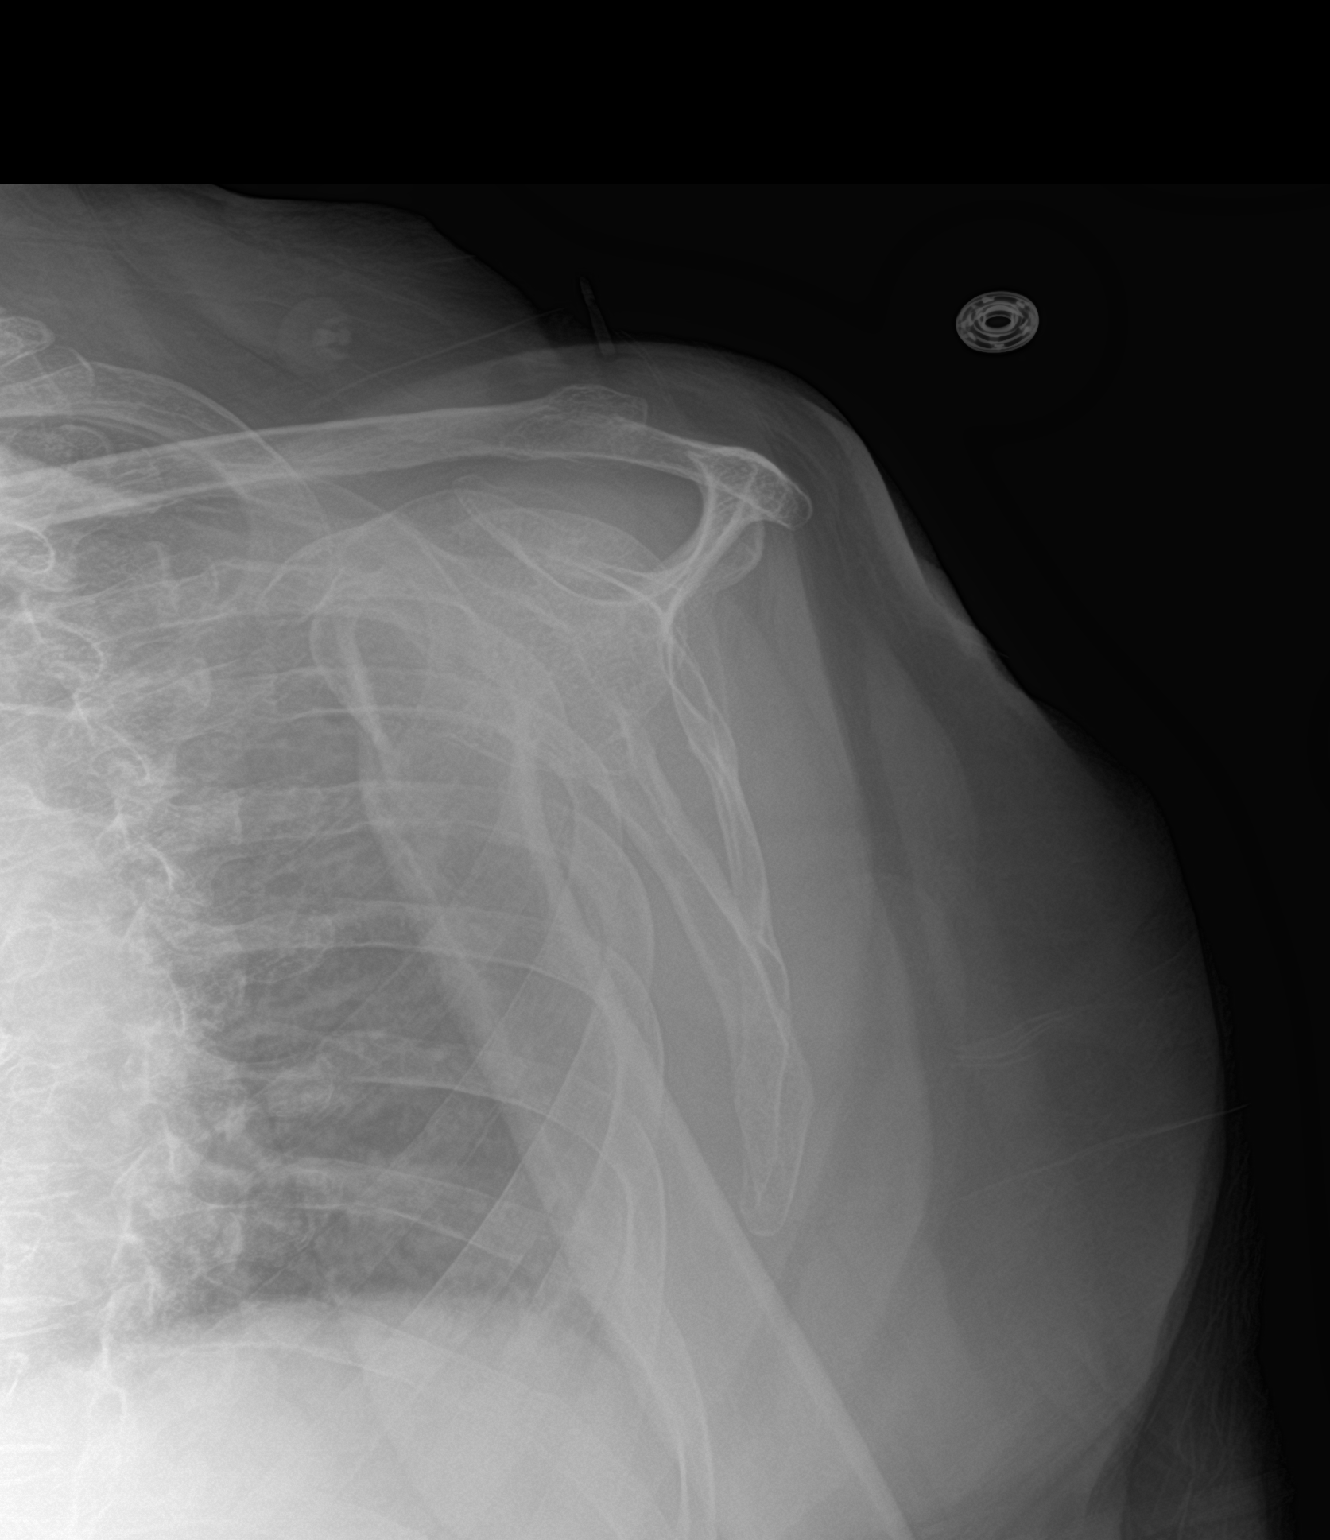

[shoulder ap neutral]
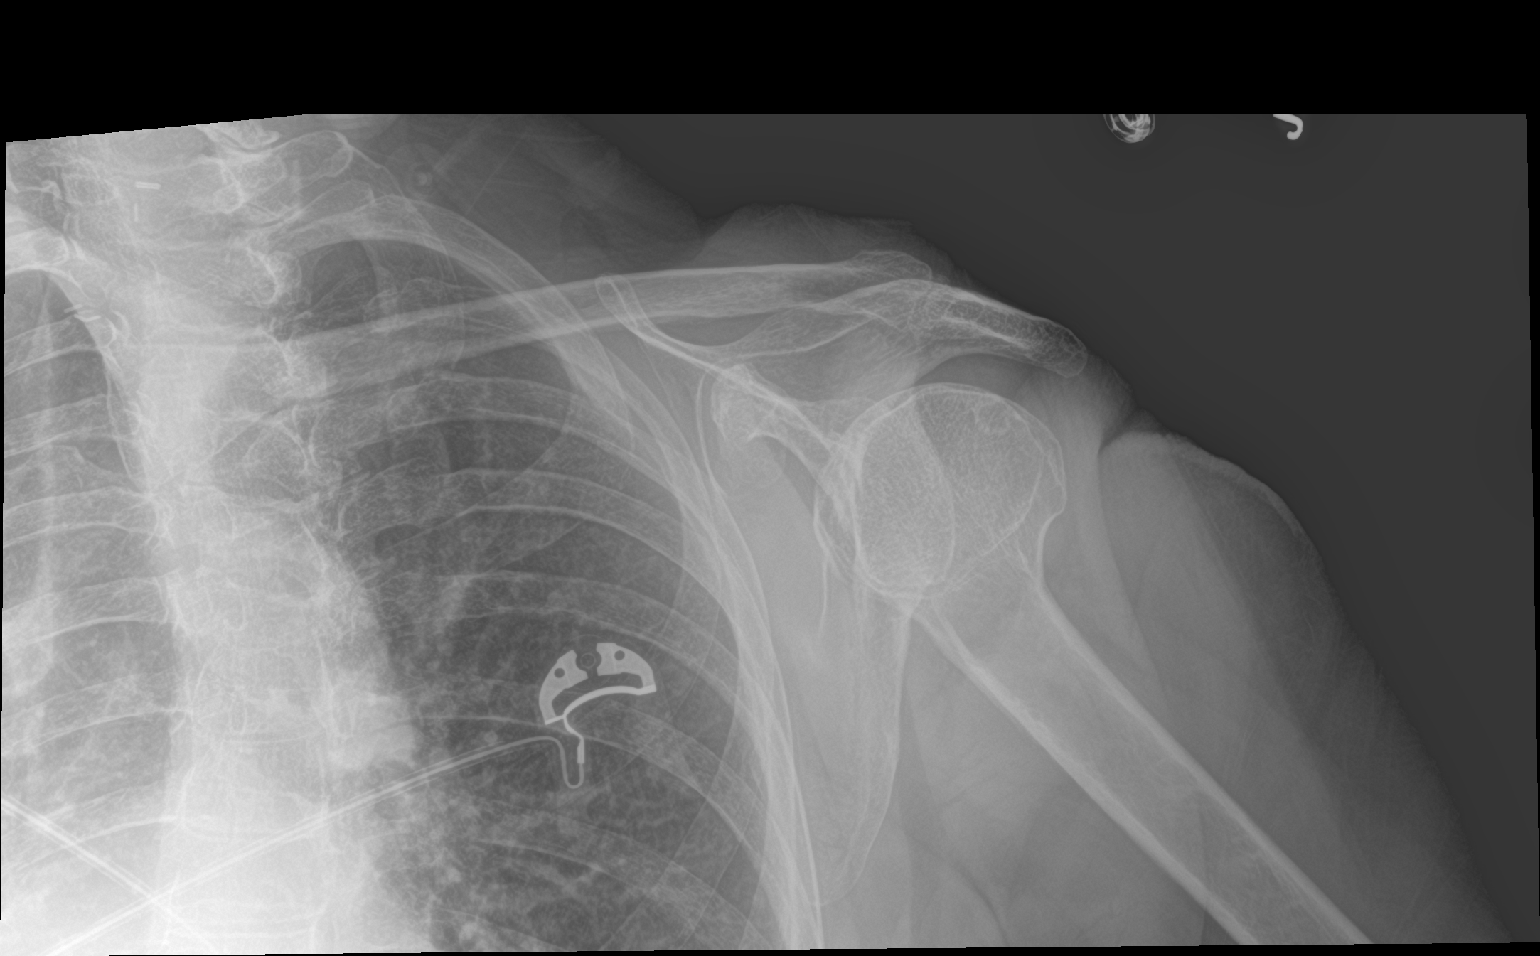

[3 of 3 positions shown; findings below may reference images not displayed]

FINDINGS: A chronic fracture deformity is seen involving the neck of the
proximal left humerus. There is no evidence of dislocation. Mild to
moderate severity degenerative changes are seen involving the left
glenohumeral joint. Soft tissues are unremarkable.
IMPRESSION: Chronic fracture deformity of the proximal left humerus without
evidence of acute fracture or dislocation.

## 2022-11-09 IMAGING — CR DG ELBOW COMPLETE 3+V*L*
4 series · 4 of 4 positions shown · non-contrast
Comparison: None.

CLINICAL DATA: Status post fall.

EXAM:
LEFT ELBOW - COMPLETE 3+ VIEW

[elbow ap]
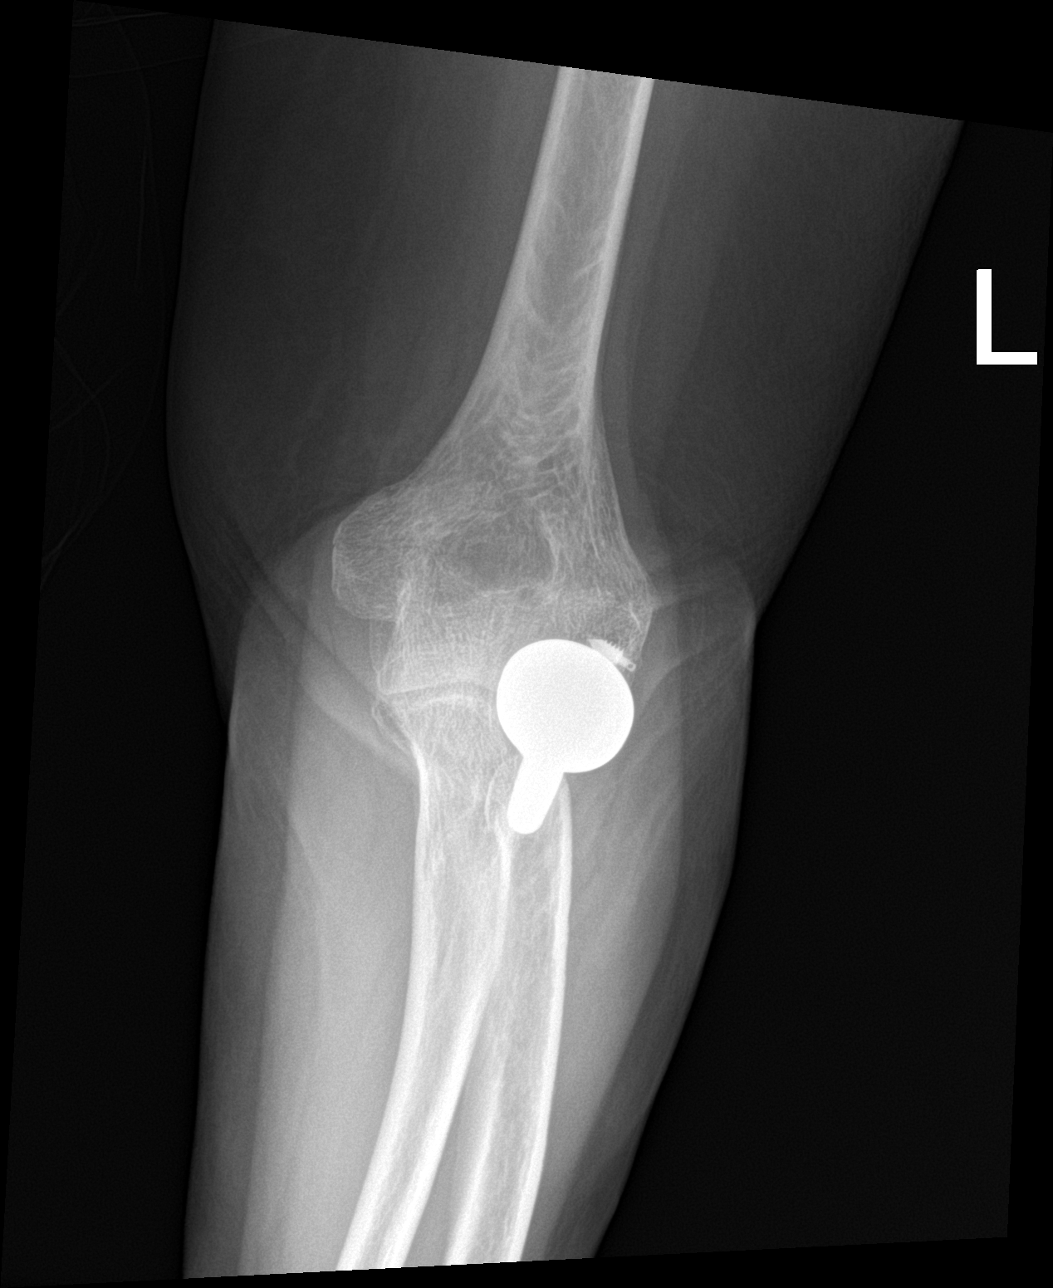

[elbow obl (1 of 2)]
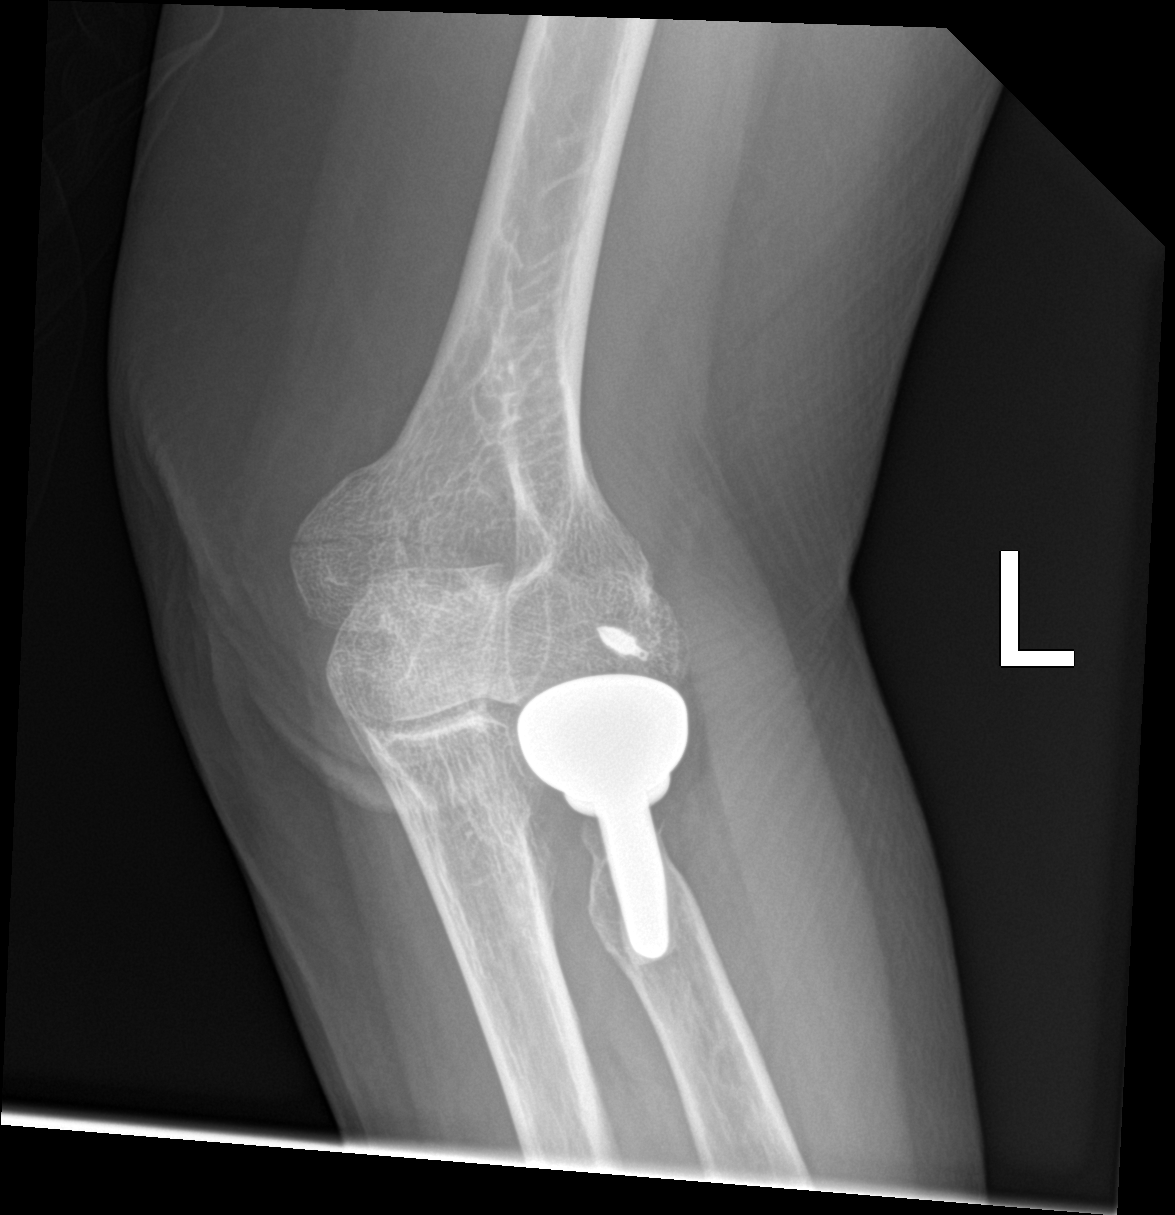

[elbow obl (2 of 2)]
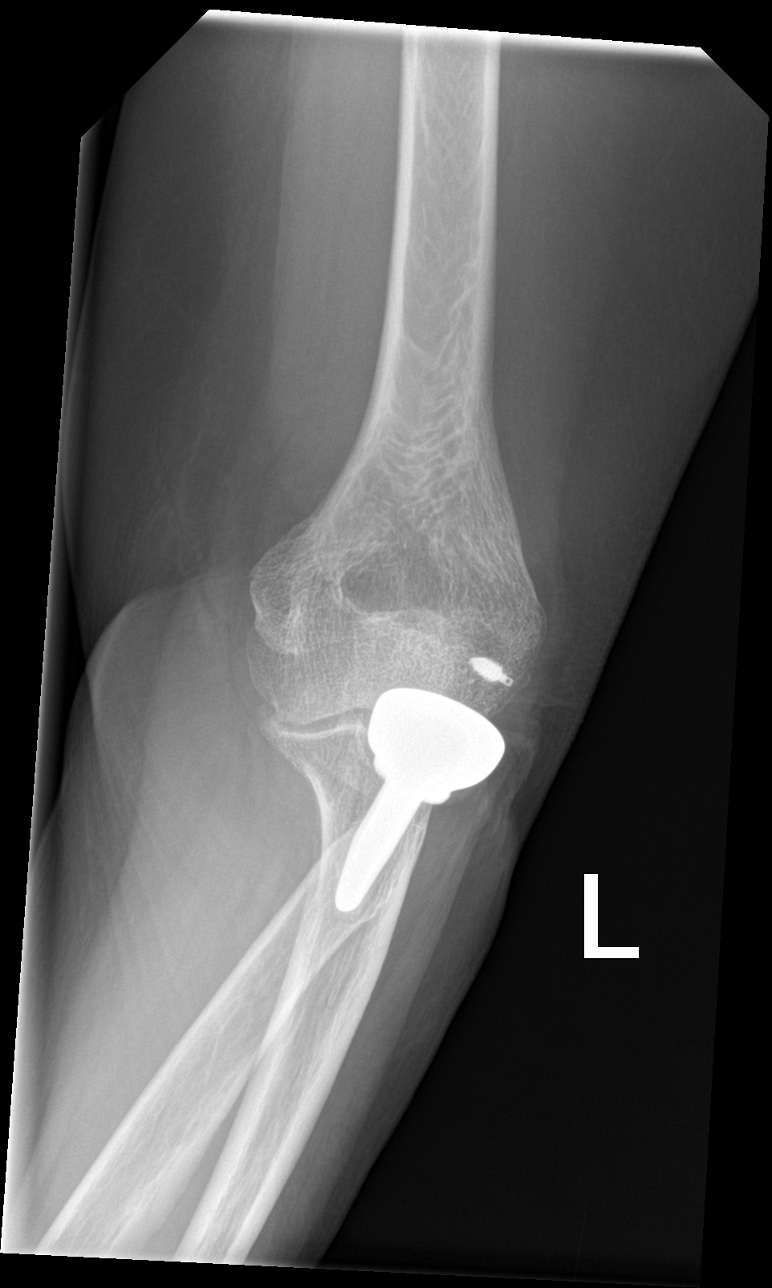

[elbow lat]
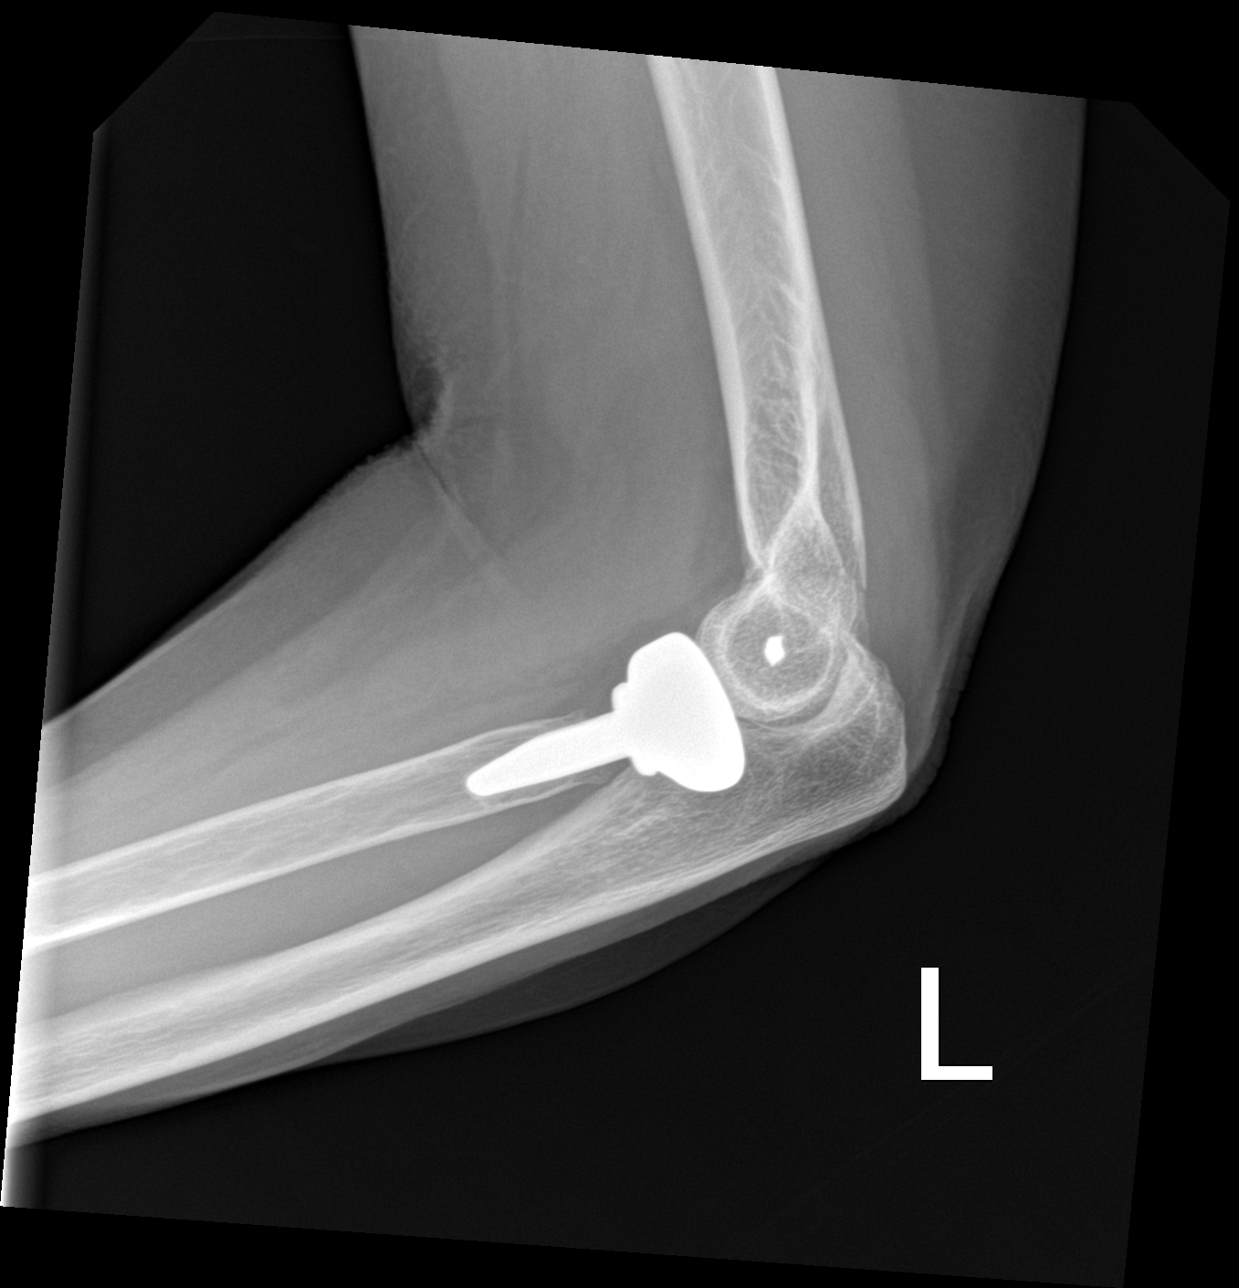

[4 of 4 positions shown; findings below may reference images not displayed]

FINDINGS: A nondisplaced fracture deformity is seen extending through the
supracondylar region of the distal left humerus. A radiopaque
prosthesis is seen along the left radial head, with a small
radiopaque surgical screw noted within the lateral epicondyle of the
distal left humerus. There is no evidence of dislocation. Soft
tissues are unremarkable.
IMPRESSION: Acute nondisplaced fracture of the distal left humerus.

## 2022-11-16 DIAGNOSIS — U071 COVID-19: Secondary | ICD-10-CM | POA: Diagnosis not present

## 2022-12-13 IMAGING — DX DG CHEST 1V PORT
1 series · 1 of 1 positions shown · non-contrast
Comparison: Chest radiograph dated 02/18/2022.

CLINICAL DATA: Questionable sepsis.

EXAM:
PORTABLE CHEST 1 VIEW

[chest]
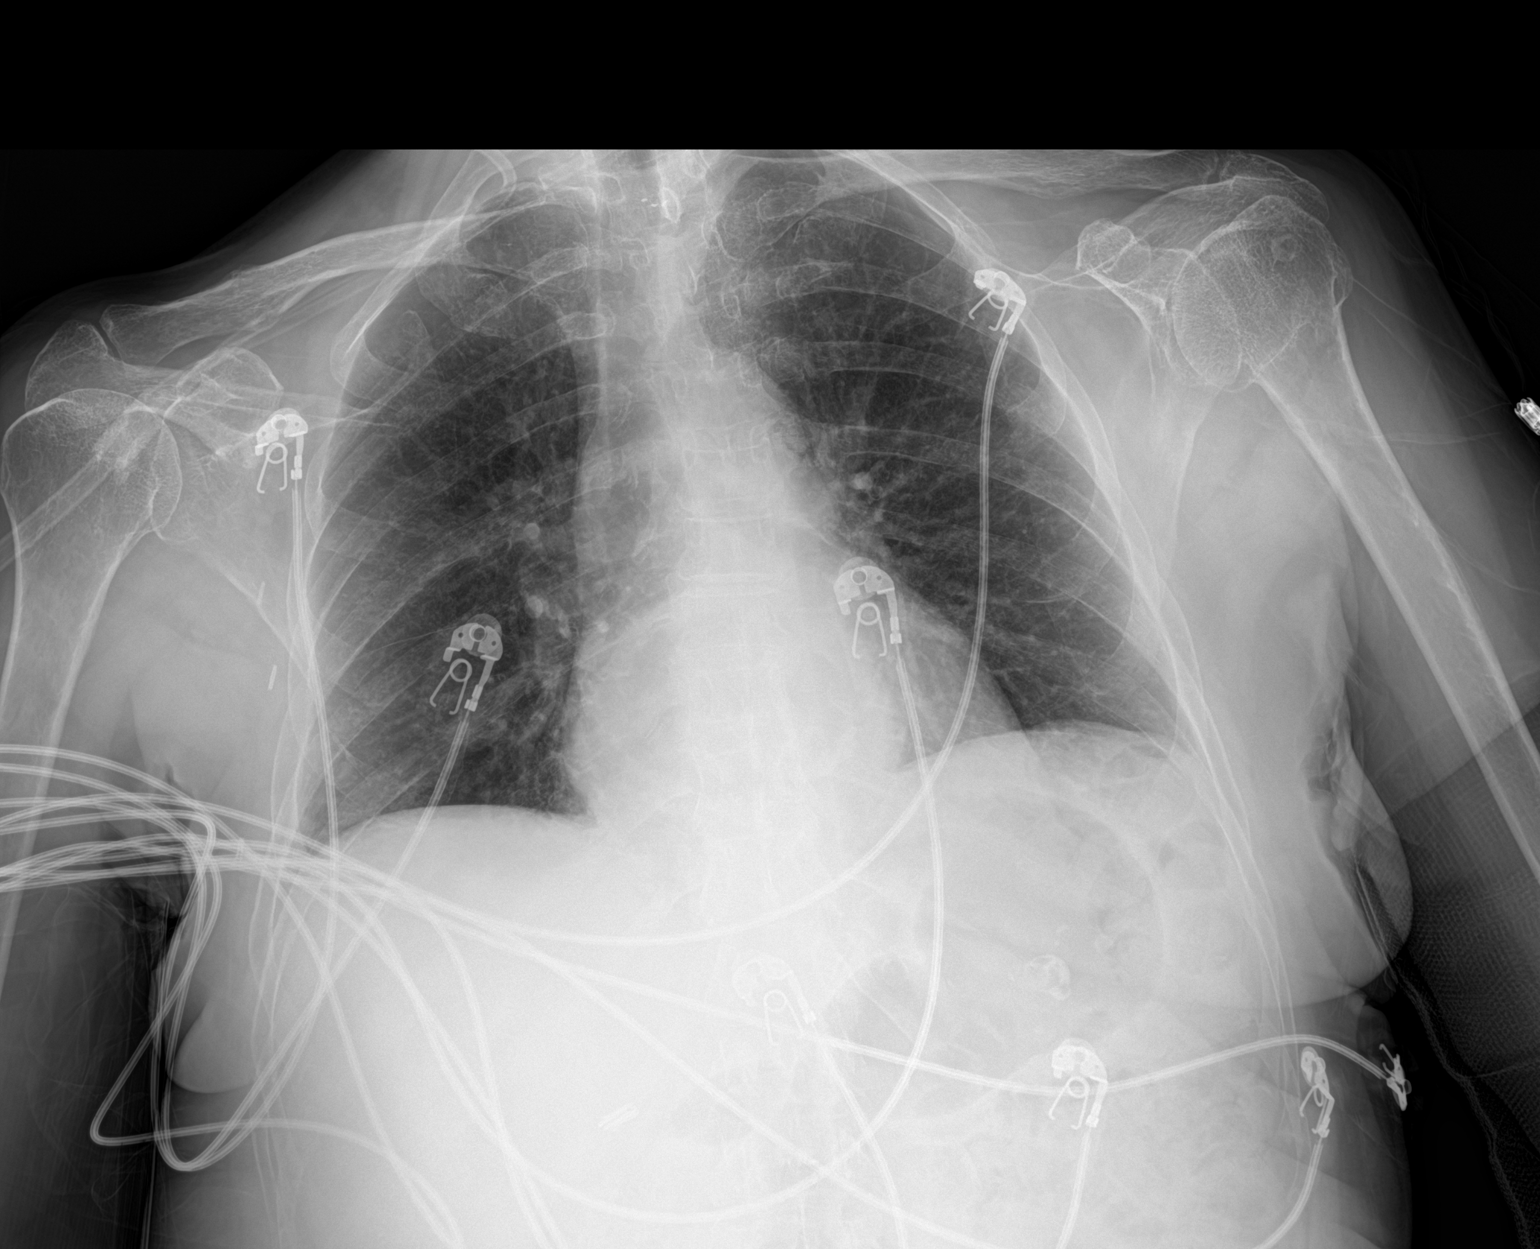

[1 of 1 positions shown; findings below may reference images not displayed]

FINDINGS: The heart size and mediastinal contours are within normal limits.
Both lungs are clear. A hiatal hernia is noted. Degenerative changes
are seen in the spine.
IMPRESSION: No active disease.

## 2022-12-14 DIAGNOSIS — R451 Restlessness and agitation: Secondary | ICD-10-CM | POA: Diagnosis not present

## 2022-12-24 DIAGNOSIS — B351 Tinea unguium: Secondary | ICD-10-CM | POA: Diagnosis not present

## 2022-12-24 DIAGNOSIS — I7091 Generalized atherosclerosis: Secondary | ICD-10-CM | POA: Diagnosis not present

## 2022-12-27 ENCOUNTER — Emergency Department (HOSPITAL_COMMUNITY): Payer: Medicare PPO

## 2022-12-27 ENCOUNTER — Other Ambulatory Visit: Payer: Self-pay

## 2022-12-27 ENCOUNTER — Emergency Department (HOSPITAL_COMMUNITY)
Admission: EM | Admit: 2022-12-27 | Discharge: 2022-12-27 | Disposition: A | Discharge status: Home / Self Care | Payer: Medicare PPO | Attending: Emergency Medicine | Admitting: Emergency Medicine

## 2022-12-27 DIAGNOSIS — F0390 Unspecified dementia without behavioral disturbance: Secondary | ICD-10-CM | POA: Insufficient documentation

## 2022-12-27 DIAGNOSIS — I10 Essential (primary) hypertension: Secondary | ICD-10-CM | POA: Diagnosis not present

## 2022-12-27 DIAGNOSIS — Z91040 Latex allergy status: Secondary | ICD-10-CM | POA: Diagnosis not present

## 2022-12-27 DIAGNOSIS — Z7901 Long term (current) use of anticoagulants: Secondary | ICD-10-CM | POA: Insufficient documentation

## 2022-12-27 DIAGNOSIS — Z23 Encounter for immunization: Secondary | ICD-10-CM | POA: Diagnosis not present

## 2022-12-27 DIAGNOSIS — Y9302 Activity, running: Secondary | ICD-10-CM | POA: Insufficient documentation

## 2022-12-27 DIAGNOSIS — R58 Hemorrhage, not elsewhere classified: Secondary | ICD-10-CM | POA: Diagnosis not present

## 2022-12-27 DIAGNOSIS — R531 Weakness: Secondary | ICD-10-CM | POA: Diagnosis not present

## 2022-12-27 DIAGNOSIS — Z79899 Other long term (current) drug therapy: Secondary | ICD-10-CM | POA: Insufficient documentation

## 2022-12-27 DIAGNOSIS — S01111A Laceration without foreign body of right eyelid and periocular area, initial encounter: Secondary | ICD-10-CM

## 2022-12-27 DIAGNOSIS — W01198A Fall on same level from slipping, tripping and stumbling with subsequent striking against other object, initial encounter: Secondary | ICD-10-CM | POA: Diagnosis not present

## 2022-12-27 DIAGNOSIS — Z8673 Personal history of transient ischemic attack (TIA), and cerebral infarction without residual deficits: Secondary | ICD-10-CM | POA: Insufficient documentation

## 2022-12-27 DIAGNOSIS — S0181XA Laceration without foreign body of other part of head, initial encounter: Secondary | ICD-10-CM | POA: Diagnosis present

## 2022-12-27 DIAGNOSIS — Z7401 Bed confinement status: Secondary | ICD-10-CM | POA: Diagnosis not present

## 2022-12-27 DIAGNOSIS — W19XXXA Unspecified fall, initial encounter: Secondary | ICD-10-CM | POA: Diagnosis not present

## 2022-12-27 DIAGNOSIS — S0990XA Unspecified injury of head, initial encounter: Secondary | ICD-10-CM | POA: Diagnosis not present

## 2022-12-27 MED ORDER — LIDOCAINE-EPINEPHRINE-TETRACAINE (LET) TOPICAL GEL
3.0000 mL | Freq: Once | TOPICAL | Status: AC
  Administered 2022-12-27: 3 mL via TOPICAL
  Filled 2022-12-27: qty 3

## 2022-12-27 MED ORDER — TETANUS-DIPHTH-ACELL PERTUSSIS 5-2.5-18.5 LF-MCG/0.5 IM SUSY
0.5000 mL | PREFILLED_SYRINGE | Freq: Once | INTRAMUSCULAR | Status: AC
Start: 2022-12-27 — End: 2022-12-27
  Administered 2022-12-27: 0.5 mL via INTRAMUSCULAR
  Filled 2022-12-27: qty 0.5

## 2022-12-27 NOTE — Discharge Instructions (Addendum)
CAT scan was negative today with no signs of broken bones or internal bleeding.

## 2022-12-27 NOTE — ED Notes (Signed)
[facility] called and advised of pts return
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
## 2022-12-27 NOTE — ED Notes (Signed)
[facility]tar called, 2nd on list

## 2022-12-27 NOTE — ED Triage Notes (Addendum)
Pt bib [facility] from [facility] via ems, pt was running around facility and fell forward onto a frame on the wall. Laceration to right forehead No loss of consciousness. Fall was witnessed. Pain is in cheek, where lac is and right side of head. Pt is on eloquis. Pt a&o x2 at baseline. Ems vitals 132/72 96% ra 108 cbg 102 hr

## 2022-12-28 DIAGNOSIS — F0390 Unspecified dementia without behavioral disturbance: Secondary | ICD-10-CM | POA: Diagnosis not present

## 2022-12-28 DIAGNOSIS — R451 Restlessness and agitation: Secondary | ICD-10-CM | POA: Diagnosis not present

## 2022-12-28 DIAGNOSIS — F03918 Unspecified dementia, unspecified severity, with other behavioral disturbance: Secondary | ICD-10-CM | POA: Diagnosis not present

## 2022-12-29 DIAGNOSIS — N3289 Other specified disorders of bladder: Secondary | ICD-10-CM | POA: Diagnosis not present

## 2022-12-29 DIAGNOSIS — R4182 Altered mental status, unspecified: Secondary | ICD-10-CM | POA: Diagnosis not present

## 2022-12-29 DIAGNOSIS — R451 Restlessness and agitation: Secondary | ICD-10-CM | POA: Diagnosis not present

## 2023-01-02 DIAGNOSIS — F32A Depression, unspecified: Secondary | ICD-10-CM | POA: Diagnosis not present

## 2023-01-02 DIAGNOSIS — F411 Generalized anxiety disorder: Secondary | ICD-10-CM | POA: Diagnosis not present

## 2023-01-02 DIAGNOSIS — F0393 Unspecified dementia, unspecified severity, with mood disturbance: Secondary | ICD-10-CM | POA: Diagnosis not present

## 2023-01-02 DIAGNOSIS — F0394 Unspecified dementia, unspecified severity, with anxiety: Secondary | ICD-10-CM | POA: Diagnosis not present

## 2023-01-02 DIAGNOSIS — K582 Mixed irritable bowel syndrome: Secondary | ICD-10-CM | POA: Diagnosis not present

## 2023-01-02 DIAGNOSIS — I110 Hypertensive heart disease with heart failure: Secondary | ICD-10-CM | POA: Diagnosis not present

## 2023-01-02 DIAGNOSIS — G479 Sleep disorder, unspecified: Secondary | ICD-10-CM | POA: Diagnosis not present

## 2023-01-02 DIAGNOSIS — I5032 Chronic diastolic (congestive) heart failure: Secondary | ICD-10-CM | POA: Diagnosis not present

## 2023-01-02 DIAGNOSIS — Z4802 Encounter for removal of sutures: Secondary | ICD-10-CM | POA: Diagnosis not present

## 2023-01-02 DIAGNOSIS — N390 Urinary tract infection, site not specified: Secondary | ICD-10-CM | POA: Diagnosis not present

## 2023-01-03 DIAGNOSIS — I110 Hypertensive heart disease with heart failure: Secondary | ICD-10-CM | POA: Diagnosis not present

## 2023-01-03 DIAGNOSIS — R32 Unspecified urinary incontinence: Secondary | ICD-10-CM | POA: Diagnosis not present

## 2023-01-03 DIAGNOSIS — I5032 Chronic diastolic (congestive) heart failure: Secondary | ICD-10-CM | POA: Diagnosis not present

## 2023-01-03 DIAGNOSIS — Z4802 Encounter for removal of sutures: Secondary | ICD-10-CM | POA: Diagnosis not present

## 2023-01-04 DIAGNOSIS — S0083XD Contusion of other part of head, subsequent encounter: Secondary | ICD-10-CM | POA: Diagnosis not present

## 2023-01-04 DIAGNOSIS — F0390 Unspecified dementia without behavioral disturbance: Secondary | ICD-10-CM | POA: Diagnosis not present

## 2023-01-04 DIAGNOSIS — R451 Restlessness and agitation: Secondary | ICD-10-CM | POA: Diagnosis not present

## 2023-01-04 DIAGNOSIS — S01111S Laceration without foreign body of right eyelid and periocular area, sequela: Secondary | ICD-10-CM | POA: Diagnosis not present

## 2023-01-04 DIAGNOSIS — N390 Urinary tract infection, site not specified: Secondary | ICD-10-CM | POA: Diagnosis not present

## 2023-01-05 DIAGNOSIS — R451 Restlessness and agitation: Secondary | ICD-10-CM | POA: Diagnosis not present

## 2023-01-10 DIAGNOSIS — K582 Mixed irritable bowel syndrome: Secondary | ICD-10-CM | POA: Diagnosis not present

## 2023-01-10 DIAGNOSIS — F0394 Unspecified dementia, unspecified severity, with anxiety: Secondary | ICD-10-CM | POA: Diagnosis not present

## 2023-01-10 DIAGNOSIS — G479 Sleep disorder, unspecified: Secondary | ICD-10-CM | POA: Diagnosis not present

## 2023-01-10 DIAGNOSIS — F32A Depression, unspecified: Secondary | ICD-10-CM | POA: Diagnosis not present

## 2023-01-10 DIAGNOSIS — I110 Hypertensive heart disease with heart failure: Secondary | ICD-10-CM | POA: Diagnosis not present

## 2023-01-10 DIAGNOSIS — Z4802 Encounter for removal of sutures: Secondary | ICD-10-CM | POA: Diagnosis not present

## 2023-01-10 DIAGNOSIS — I5032 Chronic diastolic (congestive) heart failure: Secondary | ICD-10-CM | POA: Diagnosis not present

## 2023-01-10 DIAGNOSIS — F411 Generalized anxiety disorder: Secondary | ICD-10-CM | POA: Diagnosis not present

## 2023-01-10 DIAGNOSIS — F0393 Unspecified dementia, unspecified severity, with mood disturbance: Secondary | ICD-10-CM | POA: Diagnosis not present

## 2023-01-11 DIAGNOSIS — G479 Sleep disorder, unspecified: Secondary | ICD-10-CM | POA: Diagnosis not present

## 2023-01-11 DIAGNOSIS — F0394 Unspecified dementia, unspecified severity, with anxiety: Secondary | ICD-10-CM | POA: Diagnosis not present

## 2023-01-11 DIAGNOSIS — R451 Restlessness and agitation: Secondary | ICD-10-CM | POA: Diagnosis not present

## 2023-01-11 DIAGNOSIS — G9389 Other specified disorders of brain: Secondary | ICD-10-CM | POA: Diagnosis not present

## 2023-01-11 DIAGNOSIS — I110 Hypertensive heart disease with heart failure: Secondary | ICD-10-CM | POA: Diagnosis not present

## 2023-01-11 DIAGNOSIS — F0390 Unspecified dementia without behavioral disturbance: Secondary | ICD-10-CM | POA: Diagnosis not present

## 2023-01-11 DIAGNOSIS — Z4802 Encounter for removal of sutures: Secondary | ICD-10-CM | POA: Diagnosis not present

## 2023-01-11 DIAGNOSIS — K582 Mixed irritable bowel syndrome: Secondary | ICD-10-CM | POA: Diagnosis not present

## 2023-01-11 DIAGNOSIS — F411 Generalized anxiety disorder: Secondary | ICD-10-CM | POA: Diagnosis not present

## 2023-01-11 DIAGNOSIS — F0393 Unspecified dementia, unspecified severity, with mood disturbance: Secondary | ICD-10-CM | POA: Diagnosis not present

## 2023-01-11 DIAGNOSIS — F32A Depression, unspecified: Secondary | ICD-10-CM | POA: Diagnosis not present

## 2023-01-11 DIAGNOSIS — I5032 Chronic diastolic (congestive) heart failure: Secondary | ICD-10-CM | POA: Diagnosis not present

## 2023-01-23 DIAGNOSIS — F411 Generalized anxiety disorder: Secondary | ICD-10-CM | POA: Diagnosis not present

## 2023-01-23 DIAGNOSIS — Z4802 Encounter for removal of sutures: Secondary | ICD-10-CM | POA: Diagnosis not present

## 2023-01-23 DIAGNOSIS — I110 Hypertensive heart disease with heart failure: Secondary | ICD-10-CM | POA: Diagnosis not present

## 2023-01-23 DIAGNOSIS — K582 Mixed irritable bowel syndrome: Secondary | ICD-10-CM | POA: Diagnosis not present

## 2023-01-23 DIAGNOSIS — I5032 Chronic diastolic (congestive) heart failure: Secondary | ICD-10-CM | POA: Diagnosis not present

## 2023-01-23 DIAGNOSIS — F0394 Unspecified dementia, unspecified severity, with anxiety: Secondary | ICD-10-CM | POA: Diagnosis not present

## 2023-01-23 DIAGNOSIS — G479 Sleep disorder, unspecified: Secondary | ICD-10-CM | POA: Diagnosis not present

## 2023-01-23 DIAGNOSIS — F0393 Unspecified dementia, unspecified severity, with mood disturbance: Secondary | ICD-10-CM | POA: Diagnosis not present

## 2023-01-23 DIAGNOSIS — F32A Depression, unspecified: Secondary | ICD-10-CM | POA: Diagnosis not present

## 2023-01-26 DIAGNOSIS — I110 Hypertensive heart disease with heart failure: Secondary | ICD-10-CM | POA: Diagnosis not present

## 2023-01-26 DIAGNOSIS — F0393 Unspecified dementia, unspecified severity, with mood disturbance: Secondary | ICD-10-CM | POA: Diagnosis not present

## 2023-01-26 DIAGNOSIS — F411 Generalized anxiety disorder: Secondary | ICD-10-CM | POA: Diagnosis not present

## 2023-01-26 DIAGNOSIS — F32A Depression, unspecified: Secondary | ICD-10-CM | POA: Diagnosis not present

## 2023-01-26 DIAGNOSIS — K582 Mixed irritable bowel syndrome: Secondary | ICD-10-CM | POA: Diagnosis not present

## 2023-01-26 DIAGNOSIS — F0394 Unspecified dementia, unspecified severity, with anxiety: Secondary | ICD-10-CM | POA: Diagnosis not present

## 2023-01-26 DIAGNOSIS — I5032 Chronic diastolic (congestive) heart failure: Secondary | ICD-10-CM | POA: Diagnosis not present

## 2023-01-26 DIAGNOSIS — F03918 Unspecified dementia, unspecified severity, with other behavioral disturbance: Secondary | ICD-10-CM | POA: Diagnosis not present

## 2023-01-26 DIAGNOSIS — G479 Sleep disorder, unspecified: Secondary | ICD-10-CM | POA: Diagnosis not present

## 2023-01-26 DIAGNOSIS — R451 Restlessness and agitation: Secondary | ICD-10-CM | POA: Diagnosis not present

## 2023-01-26 DIAGNOSIS — Z4802 Encounter for removal of sutures: Secondary | ICD-10-CM | POA: Diagnosis not present

## 2023-02-01 DIAGNOSIS — G479 Sleep disorder, unspecified: Secondary | ICD-10-CM | POA: Diagnosis not present

## 2023-02-01 DIAGNOSIS — F0393 Unspecified dementia, unspecified severity, with mood disturbance: Secondary | ICD-10-CM | POA: Diagnosis not present

## 2023-02-01 DIAGNOSIS — Z4802 Encounter for removal of sutures: Secondary | ICD-10-CM | POA: Diagnosis not present

## 2023-02-01 DIAGNOSIS — I110 Hypertensive heart disease with heart failure: Secondary | ICD-10-CM | POA: Diagnosis not present

## 2023-02-01 DIAGNOSIS — K582 Mixed irritable bowel syndrome: Secondary | ICD-10-CM | POA: Diagnosis not present

## 2023-02-01 DIAGNOSIS — F32A Depression, unspecified: Secondary | ICD-10-CM | POA: Diagnosis not present

## 2023-02-01 DIAGNOSIS — F0394 Unspecified dementia, unspecified severity, with anxiety: Secondary | ICD-10-CM | POA: Diagnosis not present

## 2023-02-01 DIAGNOSIS — F411 Generalized anxiety disorder: Secondary | ICD-10-CM | POA: Diagnosis not present

## 2023-02-01 DIAGNOSIS — I5032 Chronic diastolic (congestive) heart failure: Secondary | ICD-10-CM | POA: Diagnosis not present

## 2023-02-03 DIAGNOSIS — R451 Restlessness and agitation: Secondary | ICD-10-CM | POA: Diagnosis not present

## 2023-02-03 DIAGNOSIS — Z79899 Other long term (current) drug therapy: Secondary | ICD-10-CM | POA: Diagnosis not present

## 2023-02-06 DIAGNOSIS — K582 Mixed irritable bowel syndrome: Secondary | ICD-10-CM | POA: Diagnosis not present

## 2023-02-06 DIAGNOSIS — I110 Hypertensive heart disease with heart failure: Secondary | ICD-10-CM | POA: Diagnosis not present

## 2023-02-06 DIAGNOSIS — G479 Sleep disorder, unspecified: Secondary | ICD-10-CM | POA: Diagnosis not present

## 2023-02-06 DIAGNOSIS — F0394 Unspecified dementia, unspecified severity, with anxiety: Secondary | ICD-10-CM | POA: Diagnosis not present

## 2023-02-06 DIAGNOSIS — Z4802 Encounter for removal of sutures: Secondary | ICD-10-CM | POA: Diagnosis not present

## 2023-02-06 DIAGNOSIS — F32A Depression, unspecified: Secondary | ICD-10-CM | POA: Diagnosis not present

## 2023-02-06 DIAGNOSIS — I5032 Chronic diastolic (congestive) heart failure: Secondary | ICD-10-CM | POA: Diagnosis not present

## 2023-02-06 DIAGNOSIS — F411 Generalized anxiety disorder: Secondary | ICD-10-CM | POA: Diagnosis not present

## 2023-02-06 DIAGNOSIS — F0393 Unspecified dementia, unspecified severity, with mood disturbance: Secondary | ICD-10-CM | POA: Diagnosis not present

## 2023-02-08 DIAGNOSIS — I5032 Chronic diastolic (congestive) heart failure: Secondary | ICD-10-CM | POA: Diagnosis not present

## 2023-02-08 DIAGNOSIS — G479 Sleep disorder, unspecified: Secondary | ICD-10-CM | POA: Diagnosis not present

## 2023-02-08 DIAGNOSIS — F32A Depression, unspecified: Secondary | ICD-10-CM | POA: Diagnosis not present

## 2023-02-08 DIAGNOSIS — K582 Mixed irritable bowel syndrome: Secondary | ICD-10-CM | POA: Diagnosis not present

## 2023-02-08 DIAGNOSIS — F411 Generalized anxiety disorder: Secondary | ICD-10-CM | POA: Diagnosis not present

## 2023-02-08 DIAGNOSIS — Z4802 Encounter for removal of sutures: Secondary | ICD-10-CM | POA: Diagnosis not present

## 2023-02-08 DIAGNOSIS — I110 Hypertensive heart disease with heart failure: Secondary | ICD-10-CM | POA: Diagnosis not present

## 2023-02-08 DIAGNOSIS — F0393 Unspecified dementia, unspecified severity, with mood disturbance: Secondary | ICD-10-CM | POA: Diagnosis not present

## 2023-02-08 DIAGNOSIS — F0394 Unspecified dementia, unspecified severity, with anxiety: Secondary | ICD-10-CM | POA: Diagnosis not present

## 2023-02-15 DIAGNOSIS — I5032 Chronic diastolic (congestive) heart failure: Secondary | ICD-10-CM | POA: Diagnosis not present

## 2023-02-15 DIAGNOSIS — F411 Generalized anxiety disorder: Secondary | ICD-10-CM | POA: Diagnosis not present

## 2023-02-15 DIAGNOSIS — F0394 Unspecified dementia, unspecified severity, with anxiety: Secondary | ICD-10-CM | POA: Diagnosis not present

## 2023-02-15 DIAGNOSIS — I110 Hypertensive heart disease with heart failure: Secondary | ICD-10-CM | POA: Diagnosis not present

## 2023-02-15 DIAGNOSIS — F0393 Unspecified dementia, unspecified severity, with mood disturbance: Secondary | ICD-10-CM | POA: Diagnosis not present

## 2023-02-15 DIAGNOSIS — F32A Depression, unspecified: Secondary | ICD-10-CM | POA: Diagnosis not present

## 2023-02-15 DIAGNOSIS — G479 Sleep disorder, unspecified: Secondary | ICD-10-CM | POA: Diagnosis not present

## 2023-02-15 DIAGNOSIS — Z4802 Encounter for removal of sutures: Secondary | ICD-10-CM | POA: Diagnosis not present

## 2023-02-15 DIAGNOSIS — K582 Mixed irritable bowel syndrome: Secondary | ICD-10-CM | POA: Diagnosis not present

## 2023-02-20 DIAGNOSIS — G479 Sleep disorder, unspecified: Secondary | ICD-10-CM | POA: Diagnosis not present

## 2023-02-20 DIAGNOSIS — F0393 Unspecified dementia, unspecified severity, with mood disturbance: Secondary | ICD-10-CM | POA: Diagnosis not present

## 2023-02-20 DIAGNOSIS — F0394 Unspecified dementia, unspecified severity, with anxiety: Secondary | ICD-10-CM | POA: Diagnosis not present

## 2023-02-20 DIAGNOSIS — I5032 Chronic diastolic (congestive) heart failure: Secondary | ICD-10-CM | POA: Diagnosis not present

## 2023-02-20 DIAGNOSIS — F411 Generalized anxiety disorder: Secondary | ICD-10-CM | POA: Diagnosis not present

## 2023-02-20 DIAGNOSIS — F32A Depression, unspecified: Secondary | ICD-10-CM | POA: Diagnosis not present

## 2023-02-20 DIAGNOSIS — I110 Hypertensive heart disease with heart failure: Secondary | ICD-10-CM | POA: Diagnosis not present

## 2023-02-20 DIAGNOSIS — K582 Mixed irritable bowel syndrome: Secondary | ICD-10-CM | POA: Diagnosis not present

## 2023-02-20 DIAGNOSIS — Z4802 Encounter for removal of sutures: Secondary | ICD-10-CM | POA: Diagnosis not present

## 2023-02-23 DIAGNOSIS — F03918 Unspecified dementia, unspecified severity, with other behavioral disturbance: Secondary | ICD-10-CM | POA: Diagnosis not present

## 2023-02-23 DIAGNOSIS — R451 Restlessness and agitation: Secondary | ICD-10-CM | POA: Diagnosis not present

## 2023-02-23 DIAGNOSIS — I7091 Generalized atherosclerosis: Secondary | ICD-10-CM | POA: Diagnosis not present

## 2023-03-01 DIAGNOSIS — F32A Depression, unspecified: Secondary | ICD-10-CM | POA: Diagnosis not present

## 2023-03-01 DIAGNOSIS — Z4802 Encounter for removal of sutures: Secondary | ICD-10-CM | POA: Diagnosis not present

## 2023-03-01 DIAGNOSIS — K582 Mixed irritable bowel syndrome: Secondary | ICD-10-CM | POA: Diagnosis not present

## 2023-03-01 DIAGNOSIS — I110 Hypertensive heart disease with heart failure: Secondary | ICD-10-CM | POA: Diagnosis not present

## 2023-03-01 DIAGNOSIS — F411 Generalized anxiety disorder: Secondary | ICD-10-CM | POA: Diagnosis not present

## 2023-03-01 DIAGNOSIS — F0393 Unspecified dementia, unspecified severity, with mood disturbance: Secondary | ICD-10-CM | POA: Diagnosis not present

## 2023-03-01 DIAGNOSIS — I5032 Chronic diastolic (congestive) heart failure: Secondary | ICD-10-CM | POA: Diagnosis not present

## 2023-03-01 DIAGNOSIS — G479 Sleep disorder, unspecified: Secondary | ICD-10-CM | POA: Diagnosis not present

## 2023-03-01 DIAGNOSIS — F0394 Unspecified dementia, unspecified severity, with anxiety: Secondary | ICD-10-CM | POA: Diagnosis not present

## 2023-03-08 DIAGNOSIS — R4701 Aphasia: Secondary | ICD-10-CM | POA: Diagnosis not present

## 2023-03-08 DIAGNOSIS — I10 Essential (primary) hypertension: Secondary | ICD-10-CM | POA: Diagnosis not present

## 2023-03-08 DIAGNOSIS — F0390 Unspecified dementia without behavioral disturbance: Secondary | ICD-10-CM | POA: Diagnosis not present

## 2023-03-08 DIAGNOSIS — F32A Depression, unspecified: Secondary | ICD-10-CM | POA: Diagnosis not present

## 2023-03-15 DIAGNOSIS — K582 Mixed irritable bowel syndrome: Secondary | ICD-10-CM | POA: Diagnosis not present

## 2023-03-15 DIAGNOSIS — R32 Unspecified urinary incontinence: Secondary | ICD-10-CM | POA: Diagnosis not present

## 2023-03-16 DIAGNOSIS — F0390 Unspecified dementia without behavioral disturbance: Secondary | ICD-10-CM | POA: Diagnosis not present

## 2023-03-16 DIAGNOSIS — E213 Hyperparathyroidism, unspecified: Secondary | ICD-10-CM | POA: Diagnosis not present

## 2023-03-16 DIAGNOSIS — I10 Essential (primary) hypertension: Secondary | ICD-10-CM | POA: Diagnosis not present

## 2023-03-16 DIAGNOSIS — I5032 Chronic diastolic (congestive) heart failure: Secondary | ICD-10-CM | POA: Diagnosis not present

## 2023-03-16 DIAGNOSIS — E7849 Other hyperlipidemia: Secondary | ICD-10-CM | POA: Diagnosis not present

## 2023-03-22 DIAGNOSIS — I10 Essential (primary) hypertension: Secondary | ICD-10-CM | POA: Diagnosis not present

## 2023-03-22 DIAGNOSIS — E7849 Other hyperlipidemia: Secondary | ICD-10-CM | POA: Diagnosis not present

## 2023-03-22 DIAGNOSIS — I5032 Chronic diastolic (congestive) heart failure: Secondary | ICD-10-CM | POA: Diagnosis not present

## 2023-03-24 DIAGNOSIS — F03911 Unspecified dementia, unspecified severity, with agitation: Secondary | ICD-10-CM | POA: Diagnosis not present

## 2023-03-24 DIAGNOSIS — F03918 Unspecified dementia, unspecified severity, with other behavioral disturbance: Secondary | ICD-10-CM | POA: Diagnosis not present

## 2023-03-24 DIAGNOSIS — I10 Essential (primary) hypertension: Secondary | ICD-10-CM | POA: Diagnosis not present

## 2023-03-24 DIAGNOSIS — F59 Unspecified behavioral syndromes associated with physiological disturbances and physical factors: Secondary | ICD-10-CM | POA: Diagnosis not present

## 2023-03-29 DIAGNOSIS — N390 Urinary tract infection, site not specified: Secondary | ICD-10-CM | POA: Diagnosis not present

## 2023-03-29 DIAGNOSIS — R451 Restlessness and agitation: Secondary | ICD-10-CM | POA: Diagnosis not present

## 2023-03-29 DIAGNOSIS — I10 Essential (primary) hypertension: Secondary | ICD-10-CM | POA: Diagnosis not present

## 2023-03-29 DIAGNOSIS — E871 Hypo-osmolality and hyponatremia: Secondary | ICD-10-CM | POA: Diagnosis not present

## 2023-04-06 DIAGNOSIS — R7401 Elevation of levels of liver transaminase levels: Secondary | ICD-10-CM | POA: Diagnosis not present

## 2023-04-12 DIAGNOSIS — R7401 Elevation of levels of liver transaminase levels: Secondary | ICD-10-CM | POA: Diagnosis not present

## 2023-04-17 DIAGNOSIS — F03C18 Unspecified dementia, severe, with other behavioral disturbance: Secondary | ICD-10-CM | POA: Diagnosis not present

## 2023-04-17 DIAGNOSIS — F03911 Unspecified dementia, unspecified severity, with agitation: Secondary | ICD-10-CM | POA: Diagnosis not present

## 2023-04-17 DIAGNOSIS — F59 Unspecified behavioral syndromes associated with physiological disturbances and physical factors: Secondary | ICD-10-CM | POA: Diagnosis not present

## 2023-04-17 DIAGNOSIS — F03C3 Unspecified dementia, severe, with mood disturbance: Secondary | ICD-10-CM | POA: Diagnosis not present

## 2023-04-20 DIAGNOSIS — Z20818 Contact with and (suspected) exposure to other bacterial communicable diseases: Secondary | ICD-10-CM | POA: Diagnosis not present

## 2023-04-26 DIAGNOSIS — Z20828 Contact with and (suspected) exposure to other viral communicable diseases: Secondary | ICD-10-CM | POA: Diagnosis not present

## 2023-05-01 DIAGNOSIS — F03C3 Unspecified dementia, severe, with mood disturbance: Secondary | ICD-10-CM | POA: Diagnosis not present

## 2023-05-01 DIAGNOSIS — F59 Unspecified behavioral syndromes associated with physiological disturbances and physical factors: Secondary | ICD-10-CM | POA: Diagnosis not present

## 2023-05-01 DIAGNOSIS — F03911 Unspecified dementia, unspecified severity, with agitation: Secondary | ICD-10-CM | POA: Diagnosis not present

## 2023-05-01 DIAGNOSIS — F03C18 Unspecified dementia, severe, with other behavioral disturbance: Secondary | ICD-10-CM | POA: Diagnosis not present

## 2023-05-16 DIAGNOSIS — B351 Tinea unguium: Secondary | ICD-10-CM | POA: Diagnosis not present

## 2023-05-16 DIAGNOSIS — I7091 Generalized atherosclerosis: Secondary | ICD-10-CM | POA: Diagnosis not present

## 2023-05-17 DIAGNOSIS — F419 Anxiety disorder, unspecified: Secondary | ICD-10-CM | POA: Diagnosis not present

## 2023-05-17 DIAGNOSIS — F329 Major depressive disorder, single episode, unspecified: Secondary | ICD-10-CM | POA: Diagnosis not present

## 2023-05-17 DIAGNOSIS — F03C3 Unspecified dementia, severe, with mood disturbance: Secondary | ICD-10-CM | POA: Diagnosis not present

## 2023-06-14 DIAGNOSIS — F0390 Unspecified dementia without behavioral disturbance: Secondary | ICD-10-CM | POA: Diagnosis not present

## 2023-06-21 DIAGNOSIS — F329 Major depressive disorder, single episode, unspecified: Secondary | ICD-10-CM | POA: Diagnosis not present

## 2023-06-21 DIAGNOSIS — F03C3 Unspecified dementia, severe, with mood disturbance: Secondary | ICD-10-CM | POA: Diagnosis not present

## 2023-06-21 DIAGNOSIS — F419 Anxiety disorder, unspecified: Secondary | ICD-10-CM | POA: Diagnosis not present

## 2023-06-28 DIAGNOSIS — F03C3 Unspecified dementia, severe, with mood disturbance: Secondary | ICD-10-CM | POA: Diagnosis not present

## 2023-06-28 DIAGNOSIS — F03C18 Unspecified dementia, severe, with other behavioral disturbance: Secondary | ICD-10-CM | POA: Diagnosis not present

## 2023-06-28 DIAGNOSIS — F03C11 Unspecified dementia, severe, with agitation: Secondary | ICD-10-CM | POA: Diagnosis not present

## 2023-07-18 DIAGNOSIS — F329 Major depressive disorder, single episode, unspecified: Secondary | ICD-10-CM | POA: Diagnosis not present

## 2023-07-18 DIAGNOSIS — F01C18 Vascular dementia, severe, with other behavioral disturbance: Secondary | ICD-10-CM | POA: Diagnosis not present

## 2023-07-18 DIAGNOSIS — F419 Anxiety disorder, unspecified: Secondary | ICD-10-CM | POA: Diagnosis not present

## 2023-08-05 DIAGNOSIS — I7091 Generalized atherosclerosis: Secondary | ICD-10-CM | POA: Diagnosis not present

## 2023-08-05 DIAGNOSIS — B351 Tinea unguium: Secondary | ICD-10-CM | POA: Diagnosis not present

## 2023-08-16 DIAGNOSIS — F419 Anxiety disorder, unspecified: Secondary | ICD-10-CM | POA: Diagnosis not present

## 2023-08-16 DIAGNOSIS — F01C18 Vascular dementia, severe, with other behavioral disturbance: Secondary | ICD-10-CM | POA: Diagnosis not present

## 2023-08-16 DIAGNOSIS — F329 Major depressive disorder, single episode, unspecified: Secondary | ICD-10-CM | POA: Diagnosis not present

## 2023-08-30 DIAGNOSIS — R32 Unspecified urinary incontinence: Secondary | ICD-10-CM | POA: Diagnosis not present

## 2023-08-30 DIAGNOSIS — R350 Frequency of micturition: Secondary | ICD-10-CM | POA: Diagnosis not present

## 2023-09-01 DIAGNOSIS — R3915 Urgency of urination: Secondary | ICD-10-CM | POA: Diagnosis not present

## 2023-09-01 DIAGNOSIS — N209 Urinary calculus, unspecified: Secondary | ICD-10-CM | POA: Diagnosis not present

## 2023-09-11 DIAGNOSIS — F329 Major depressive disorder, single episode, unspecified: Secondary | ICD-10-CM | POA: Diagnosis not present

## 2023-09-11 DIAGNOSIS — F419 Anxiety disorder, unspecified: Secondary | ICD-10-CM | POA: Diagnosis not present

## 2023-09-11 DIAGNOSIS — F01C18 Vascular dementia, severe, with other behavioral disturbance: Secondary | ICD-10-CM | POA: Diagnosis not present

## 2023-09-12 DIAGNOSIS — N390 Urinary tract infection, site not specified: Secondary | ICD-10-CM | POA: Diagnosis not present

## 2023-09-22 DIAGNOSIS — R4182 Altered mental status, unspecified: Secondary | ICD-10-CM | POA: Diagnosis not present

## 2023-09-22 DIAGNOSIS — N390 Urinary tract infection, site not specified: Secondary | ICD-10-CM | POA: Diagnosis not present

## 2023-09-22 DIAGNOSIS — F0390 Unspecified dementia without behavioral disturbance: Secondary | ICD-10-CM | POA: Diagnosis not present

## 2023-09-27 DIAGNOSIS — M6281 Muscle weakness (generalized): Secondary | ICD-10-CM | POA: Diagnosis not present

## 2023-09-27 DIAGNOSIS — N390 Urinary tract infection, site not specified: Secondary | ICD-10-CM | POA: Diagnosis not present

## 2023-09-27 DIAGNOSIS — R4182 Altered mental status, unspecified: Secondary | ICD-10-CM | POA: Diagnosis not present

## 2023-09-27 DIAGNOSIS — R2689 Other abnormalities of gait and mobility: Secondary | ICD-10-CM | POA: Diagnosis not present

## 2023-10-02 ENCOUNTER — Emergency Department (HOSPITAL_COMMUNITY): Payer: Medicare PPO

## 2023-10-02 ENCOUNTER — Other Ambulatory Visit: Payer: Self-pay

## 2023-10-02 ENCOUNTER — Emergency Department (HOSPITAL_COMMUNITY)
Admission: EM | Admit: 2023-10-02 | Discharge: 2023-10-02 | Disposition: A | Discharge status: Home / Self Care | Payer: Medicare PPO | Attending: Emergency Medicine | Admitting: Emergency Medicine

## 2023-10-02 ENCOUNTER — Encounter (HOSPITAL_COMMUNITY): Payer: Self-pay | Admitting: Emergency Medicine

## 2023-10-02 DIAGNOSIS — R4182 Altered mental status, unspecified: Secondary | ICD-10-CM | POA: Insufficient documentation

## 2023-10-02 DIAGNOSIS — R456 Violent behavior: Secondary | ICD-10-CM | POA: Diagnosis not present

## 2023-10-02 DIAGNOSIS — D72829 Elevated white blood cell count, unspecified: Secondary | ICD-10-CM | POA: Diagnosis not present

## 2023-10-02 DIAGNOSIS — Z7401 Bed confinement status: Secondary | ICD-10-CM | POA: Diagnosis not present

## 2023-10-02 DIAGNOSIS — Z7901 Long term (current) use of anticoagulants: Secondary | ICD-10-CM | POA: Insufficient documentation

## 2023-10-02 DIAGNOSIS — I10 Essential (primary) hypertension: Secondary | ICD-10-CM | POA: Diagnosis not present

## 2023-10-02 DIAGNOSIS — R531 Weakness: Secondary | ICD-10-CM | POA: Diagnosis not present

## 2023-10-02 DIAGNOSIS — F03918 Unspecified dementia, unspecified severity, with other behavioral disturbance: Secondary | ICD-10-CM | POA: Insufficient documentation

## 2023-10-02 DIAGNOSIS — Z79899 Other long term (current) drug therapy: Secondary | ICD-10-CM | POA: Insufficient documentation

## 2023-10-02 DIAGNOSIS — R0689 Other abnormalities of breathing: Secondary | ICD-10-CM | POA: Diagnosis not present

## 2023-10-02 DIAGNOSIS — Z91040 Latex allergy status: Secondary | ICD-10-CM | POA: Diagnosis not present

## 2023-10-02 DIAGNOSIS — F0390 Unspecified dementia without behavioral disturbance: Secondary | ICD-10-CM | POA: Diagnosis not present

## 2023-10-02 LAB — CBC
HCT: 34.7 % — ABNORMAL LOW (ref 36.0–46.0)
Hemoglobin: 11.8 g/dL — ABNORMAL LOW (ref 12.0–15.0)
MCH: 32.2 pg (ref 26.0–34.0)
MCHC: 34 g/dL (ref 30.0–36.0)
MCV: 94.6 fL (ref 80.0–100.0)
Platelets: 307 10*3/uL (ref 150–400)
RBC: 3.67 MIL/uL — ABNORMAL LOW (ref 3.87–5.11)
RDW: 13.3 % (ref 11.5–15.5)
WBC: 6.4 10*3/uL (ref 4.0–10.5)
nRBC: 0 % (ref 0.0–0.2)

## 2023-10-02 LAB — COMPREHENSIVE METABOLIC PANEL
ALT: 16 U/L (ref 0–44)
AST: 19 U/L (ref 15–41)
Albumin: 4.1 g/dL (ref 3.5–5.0)
Alkaline Phosphatase: 60 U/L (ref 38–126)
Anion gap: 6 (ref 5–15)
BUN: 30 mg/dL — ABNORMAL HIGH (ref 8–23)
CO2: 22 mmol/L (ref 22–32)
Calcium: 10.4 mg/dL — ABNORMAL HIGH (ref 8.9–10.3)
Chloride: 106 mmol/L (ref 98–111)
Creatinine, Ser: 1.03 mg/dL — ABNORMAL HIGH (ref 0.44–1.00)
GFR, Estimated: 57 mL/min — ABNORMAL LOW (ref >60)
Glucose, Bld: 108 mg/dL — ABNORMAL HIGH (ref 70–99)
Potassium: 4.5 mmol/L (ref 3.5–5.1)
Sodium: 134 mmol/L — ABNORMAL LOW (ref 135–145)
Total Bilirubin: 1.2 mg/dL — ABNORMAL HIGH (ref <1.2)
Total Protein: 7.1 g/dL (ref 6.5–8.1)

## 2023-10-02 MED ORDER — RISPERIDONE 0.5 MG PO TABS: 0.5000 mg | ORAL_TABLET | Freq: Every day | ORAL | 0 refills | Status: DC

## 2023-10-02 MED ORDER — RISPERIDONE 0.5 MG PO TABS
0.5000 mg | ORAL_TABLET | Freq: Once | ORAL | Status: AC
  Administered 2023-10-02: 0.5 mg via ORAL
  Filled 2023-10-02: qty 1

## 2023-10-02 NOTE — ED Notes (Signed)
[place], rn took report
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
## 2023-10-02 NOTE — Discharge Instructions (Signed)
Start taking the risperdal at night.  You were given a dose in the emergency room. Follow-up with your primary care doctor to be rechecked

## 2023-10-06 DIAGNOSIS — F0390 Unspecified dementia without behavioral disturbance: Secondary | ICD-10-CM | POA: Diagnosis not present

## 2023-10-06 DIAGNOSIS — I5032 Chronic diastolic (congestive) heart failure: Secondary | ICD-10-CM | POA: Diagnosis not present

## 2023-10-06 DIAGNOSIS — E7849 Other hyperlipidemia: Secondary | ICD-10-CM | POA: Diagnosis not present

## 2023-10-06 DIAGNOSIS — I10 Essential (primary) hypertension: Secondary | ICD-10-CM | POA: Diagnosis not present

## 2023-10-06 DIAGNOSIS — E119 Type 2 diabetes mellitus without complications: Secondary | ICD-10-CM | POA: Diagnosis not present

## 2023-10-11 DIAGNOSIS — F0390 Unspecified dementia without behavioral disturbance: Secondary | ICD-10-CM | POA: Diagnosis not present

## 2023-10-11 DIAGNOSIS — N3289 Other specified disorders of bladder: Secondary | ICD-10-CM | POA: Diagnosis not present

## 2023-10-11 DIAGNOSIS — Z0000 Encounter for general adult medical examination without abnormal findings: Secondary | ICD-10-CM | POA: Diagnosis not present

## 2023-10-11 DIAGNOSIS — M6281 Muscle weakness (generalized): Secondary | ICD-10-CM | POA: Diagnosis not present

## 2023-10-11 DIAGNOSIS — R2689 Other abnormalities of gait and mobility: Secondary | ICD-10-CM | POA: Diagnosis not present

## 2023-10-11 DIAGNOSIS — R32 Unspecified urinary incontinence: Secondary | ICD-10-CM | POA: Diagnosis not present

## 2023-10-11 DIAGNOSIS — K582 Mixed irritable bowel syndrome: Secondary | ICD-10-CM | POA: Diagnosis not present

## 2023-10-11 DIAGNOSIS — R21 Rash and other nonspecific skin eruption: Secondary | ICD-10-CM | POA: Diagnosis not present

## 2023-10-18 DIAGNOSIS — F419 Anxiety disorder, unspecified: Secondary | ICD-10-CM | POA: Diagnosis not present

## 2023-10-18 DIAGNOSIS — R21 Rash and other nonspecific skin eruption: Secondary | ICD-10-CM | POA: Diagnosis not present

## 2023-10-18 DIAGNOSIS — F329 Major depressive disorder, single episode, unspecified: Secondary | ICD-10-CM | POA: Diagnosis not present

## 2023-10-18 DIAGNOSIS — F01C18 Vascular dementia, severe, with other behavioral disturbance: Secondary | ICD-10-CM | POA: Diagnosis not present

## 2023-10-25 DIAGNOSIS — I10 Essential (primary) hypertension: Secondary | ICD-10-CM | POA: Diagnosis not present

## 2023-10-25 DIAGNOSIS — R2681 Unsteadiness on feet: Secondary | ICD-10-CM | POA: Diagnosis not present

## 2023-10-25 DIAGNOSIS — M6281 Muscle weakness (generalized): Secondary | ICD-10-CM | POA: Diagnosis not present

## 2023-10-25 DIAGNOSIS — Z9181 History of falling: Secondary | ICD-10-CM | POA: Diagnosis not present

## 2023-10-25 DIAGNOSIS — F0390 Unspecified dementia without behavioral disturbance: Secondary | ICD-10-CM | POA: Diagnosis not present

## 2023-10-26 DIAGNOSIS — I10 Essential (primary) hypertension: Secondary | ICD-10-CM | POA: Diagnosis not present

## 2023-11-01 DIAGNOSIS — Z9181 History of falling: Secondary | ICD-10-CM | POA: Diagnosis not present

## 2023-11-01 DIAGNOSIS — M6281 Muscle weakness (generalized): Secondary | ICD-10-CM | POA: Diagnosis not present

## 2023-11-06 DIAGNOSIS — M6281 Muscle weakness (generalized): Secondary | ICD-10-CM | POA: Diagnosis not present

## 2023-11-06 DIAGNOSIS — Z9181 History of falling: Secondary | ICD-10-CM | POA: Diagnosis not present

## 2023-11-07 DIAGNOSIS — M6281 Muscle weakness (generalized): Secondary | ICD-10-CM | POA: Diagnosis not present

## 2023-11-07 DIAGNOSIS — R2681 Unsteadiness on feet: Secondary | ICD-10-CM | POA: Diagnosis not present

## 2023-11-07 DIAGNOSIS — Z9181 History of falling: Secondary | ICD-10-CM | POA: Diagnosis not present

## 2023-11-09 DIAGNOSIS — Z9181 History of falling: Secondary | ICD-10-CM | POA: Diagnosis not present

## 2023-11-09 DIAGNOSIS — R2681 Unsteadiness on feet: Secondary | ICD-10-CM | POA: Diagnosis not present

## 2023-11-09 DIAGNOSIS — M6281 Muscle weakness (generalized): Secondary | ICD-10-CM | POA: Diagnosis not present

## 2023-11-13 DIAGNOSIS — M6281 Muscle weakness (generalized): Secondary | ICD-10-CM | POA: Diagnosis not present

## 2023-11-13 DIAGNOSIS — Z9181 History of falling: Secondary | ICD-10-CM | POA: Diagnosis not present

## 2023-11-13 DIAGNOSIS — Z20828 Contact with and (suspected) exposure to other viral communicable diseases: Secondary | ICD-10-CM | POA: Diagnosis not present

## 2023-11-13 DIAGNOSIS — R051 Acute cough: Secondary | ICD-10-CM | POA: Diagnosis not present

## 2023-11-14 DIAGNOSIS — Z9181 History of falling: Secondary | ICD-10-CM | POA: Diagnosis not present

## 2023-11-14 DIAGNOSIS — R2681 Unsteadiness on feet: Secondary | ICD-10-CM | POA: Diagnosis not present

## 2023-11-14 DIAGNOSIS — M6281 Muscle weakness (generalized): Secondary | ICD-10-CM | POA: Diagnosis not present

## 2023-11-15 DIAGNOSIS — Z9181 History of falling: Secondary | ICD-10-CM | POA: Diagnosis not present

## 2023-11-15 DIAGNOSIS — M6281 Muscle weakness (generalized): Secondary | ICD-10-CM | POA: Diagnosis not present

## 2023-11-15 DIAGNOSIS — W1830XA Fall on same level, unspecified, initial encounter: Secondary | ICD-10-CM | POA: Diagnosis not present

## 2023-11-15 DIAGNOSIS — R2689 Other abnormalities of gait and mobility: Secondary | ICD-10-CM | POA: Diagnosis not present

## 2023-11-16 DIAGNOSIS — Z9181 History of falling: Secondary | ICD-10-CM | POA: Diagnosis not present

## 2023-11-16 DIAGNOSIS — R2681 Unsteadiness on feet: Secondary | ICD-10-CM | POA: Diagnosis not present

## 2023-11-16 DIAGNOSIS — M6281 Muscle weakness (generalized): Secondary | ICD-10-CM | POA: Diagnosis not present

## 2023-11-20 DIAGNOSIS — F419 Anxiety disorder, unspecified: Secondary | ICD-10-CM | POA: Diagnosis not present

## 2023-11-20 DIAGNOSIS — R4182 Altered mental status, unspecified: Secondary | ICD-10-CM | POA: Diagnosis not present

## 2023-11-20 DIAGNOSIS — F329 Major depressive disorder, single episode, unspecified: Secondary | ICD-10-CM | POA: Diagnosis not present

## 2023-11-20 DIAGNOSIS — Z9181 History of falling: Secondary | ICD-10-CM | POA: Diagnosis not present

## 2023-11-20 DIAGNOSIS — F01C18 Vascular dementia, severe, with other behavioral disturbance: Secondary | ICD-10-CM | POA: Diagnosis not present

## 2023-11-20 DIAGNOSIS — M6281 Muscle weakness (generalized): Secondary | ICD-10-CM | POA: Diagnosis not present

## 2023-11-21 DIAGNOSIS — R2681 Unsteadiness on feet: Secondary | ICD-10-CM | POA: Diagnosis not present

## 2023-11-21 DIAGNOSIS — Z9181 History of falling: Secondary | ICD-10-CM | POA: Diagnosis not present

## 2023-11-21 DIAGNOSIS — M6281 Muscle weakness (generalized): Secondary | ICD-10-CM | POA: Diagnosis not present

## 2023-11-22 DIAGNOSIS — Z9181 History of falling: Secondary | ICD-10-CM | POA: Diagnosis not present

## 2023-11-22 DIAGNOSIS — M6281 Muscle weakness (generalized): Secondary | ICD-10-CM | POA: Diagnosis not present

## 2023-11-23 DIAGNOSIS — R451 Restlessness and agitation: Secondary | ICD-10-CM | POA: Diagnosis not present

## 2023-11-23 DIAGNOSIS — N390 Urinary tract infection, site not specified: Secondary | ICD-10-CM | POA: Diagnosis not present

## 2023-11-23 DIAGNOSIS — F0390 Unspecified dementia without behavioral disturbance: Secondary | ICD-10-CM | POA: Diagnosis not present

## 2023-11-26 DIAGNOSIS — N390 Urinary tract infection, site not specified: Secondary | ICD-10-CM | POA: Diagnosis not present

## 2023-11-28 DIAGNOSIS — R2681 Unsteadiness on feet: Secondary | ICD-10-CM | POA: Diagnosis not present

## 2023-11-28 DIAGNOSIS — M6281 Muscle weakness (generalized): Secondary | ICD-10-CM | POA: Diagnosis not present

## 2023-11-28 DIAGNOSIS — Z9181 History of falling: Secondary | ICD-10-CM | POA: Diagnosis not present

## 2023-11-30 DIAGNOSIS — Z9181 History of falling: Secondary | ICD-10-CM | POA: Diagnosis not present

## 2023-11-30 DIAGNOSIS — R2681 Unsteadiness on feet: Secondary | ICD-10-CM | POA: Diagnosis not present

## 2023-11-30 DIAGNOSIS — M6281 Muscle weakness (generalized): Secondary | ICD-10-CM | POA: Diagnosis not present

## 2023-12-01 DIAGNOSIS — Z9181 History of falling: Secondary | ICD-10-CM | POA: Diagnosis not present

## 2023-12-01 DIAGNOSIS — M6281 Muscle weakness (generalized): Secondary | ICD-10-CM | POA: Diagnosis not present

## 2023-12-04 DIAGNOSIS — M6281 Muscle weakness (generalized): Secondary | ICD-10-CM | POA: Diagnosis not present

## 2023-12-04 DIAGNOSIS — Z9181 History of falling: Secondary | ICD-10-CM | POA: Diagnosis not present

## 2023-12-05 DIAGNOSIS — Z9181 History of falling: Secondary | ICD-10-CM | POA: Diagnosis not present

## 2023-12-05 DIAGNOSIS — R2681 Unsteadiness on feet: Secondary | ICD-10-CM | POA: Diagnosis not present

## 2023-12-05 DIAGNOSIS — M6281 Muscle weakness (generalized): Secondary | ICD-10-CM | POA: Diagnosis not present

## 2023-12-06 DIAGNOSIS — G9341 Metabolic encephalopathy: Secondary | ICD-10-CM | POA: Diagnosis not present

## 2023-12-06 DIAGNOSIS — R4182 Altered mental status, unspecified: Secondary | ICD-10-CM | POA: Diagnosis not present

## 2023-12-06 DIAGNOSIS — F0390 Unspecified dementia without behavioral disturbance: Secondary | ICD-10-CM | POA: Diagnosis not present

## 2023-12-06 DIAGNOSIS — Z9181 History of falling: Secondary | ICD-10-CM | POA: Diagnosis not present

## 2023-12-06 DIAGNOSIS — M6281 Muscle weakness (generalized): Secondary | ICD-10-CM | POA: Diagnosis not present

## 2023-12-06 DIAGNOSIS — R634 Abnormal weight loss: Secondary | ICD-10-CM | POA: Diagnosis not present

## 2023-12-07 DIAGNOSIS — R2681 Unsteadiness on feet: Secondary | ICD-10-CM | POA: Diagnosis not present

## 2023-12-07 DIAGNOSIS — M6281 Muscle weakness (generalized): Secondary | ICD-10-CM | POA: Diagnosis not present

## 2023-12-07 DIAGNOSIS — Z9181 History of falling: Secondary | ICD-10-CM | POA: Diagnosis not present

## 2023-12-11 DIAGNOSIS — M6281 Muscle weakness (generalized): Secondary | ICD-10-CM | POA: Diagnosis not present

## 2023-12-11 DIAGNOSIS — Z9181 History of falling: Secondary | ICD-10-CM | POA: Diagnosis not present

## 2023-12-12 DIAGNOSIS — Z9181 History of falling: Secondary | ICD-10-CM | POA: Diagnosis not present

## 2023-12-12 DIAGNOSIS — R2681 Unsteadiness on feet: Secondary | ICD-10-CM | POA: Diagnosis not present

## 2023-12-12 DIAGNOSIS — M6281 Muscle weakness (generalized): Secondary | ICD-10-CM | POA: Diagnosis not present

## 2023-12-13 DIAGNOSIS — R4182 Altered mental status, unspecified: Secondary | ICD-10-CM | POA: Diagnosis not present

## 2023-12-13 DIAGNOSIS — M6281 Muscle weakness (generalized): Secondary | ICD-10-CM | POA: Diagnosis not present

## 2023-12-13 DIAGNOSIS — Z9181 History of falling: Secondary | ICD-10-CM | POA: Diagnosis not present

## 2023-12-14 DIAGNOSIS — M6281 Muscle weakness (generalized): Secondary | ICD-10-CM | POA: Diagnosis not present

## 2023-12-14 DIAGNOSIS — R2681 Unsteadiness on feet: Secondary | ICD-10-CM | POA: Diagnosis not present

## 2023-12-14 DIAGNOSIS — Z9181 History of falling: Secondary | ICD-10-CM | POA: Diagnosis not present

## 2023-12-18 DIAGNOSIS — L84 Corns and callosities: Secondary | ICD-10-CM | POA: Diagnosis not present

## 2023-12-18 DIAGNOSIS — B351 Tinea unguium: Secondary | ICD-10-CM | POA: Diagnosis not present

## 2023-12-18 DIAGNOSIS — M79674 Pain in right toe(s): Secondary | ICD-10-CM | POA: Diagnosis not present

## 2023-12-18 DIAGNOSIS — M79675 Pain in left toe(s): Secondary | ICD-10-CM | POA: Diagnosis not present

## 2023-12-18 DIAGNOSIS — L608 Other nail disorders: Secondary | ICD-10-CM | POA: Diagnosis not present

## 2023-12-18 DIAGNOSIS — R2681 Unsteadiness on feet: Secondary | ICD-10-CM | POA: Diagnosis not present

## 2023-12-18 DIAGNOSIS — F0390 Unspecified dementia without behavioral disturbance: Secondary | ICD-10-CM | POA: Diagnosis not present

## 2023-12-19 DIAGNOSIS — M6281 Muscle weakness (generalized): Secondary | ICD-10-CM | POA: Diagnosis not present

## 2023-12-19 DIAGNOSIS — R2681 Unsteadiness on feet: Secondary | ICD-10-CM | POA: Diagnosis not present

## 2023-12-19 DIAGNOSIS — Z9181 History of falling: Secondary | ICD-10-CM | POA: Diagnosis not present

## 2023-12-20 DIAGNOSIS — F0390 Unspecified dementia without behavioral disturbance: Secondary | ICD-10-CM | POA: Diagnosis not present

## 2023-12-20 DIAGNOSIS — R634 Abnormal weight loss: Secondary | ICD-10-CM | POA: Diagnosis not present

## 2023-12-20 DIAGNOSIS — R4182 Altered mental status, unspecified: Secondary | ICD-10-CM | POA: Diagnosis not present

## 2023-12-20 DIAGNOSIS — N390 Urinary tract infection, site not specified: Secondary | ICD-10-CM | POA: Diagnosis not present

## 2023-12-21 DIAGNOSIS — F01C18 Vascular dementia, severe, with other behavioral disturbance: Secondary | ICD-10-CM | POA: Diagnosis not present

## 2023-12-21 DIAGNOSIS — M6281 Muscle weakness (generalized): Secondary | ICD-10-CM | POA: Diagnosis not present

## 2023-12-21 DIAGNOSIS — R4182 Altered mental status, unspecified: Secondary | ICD-10-CM | POA: Diagnosis not present

## 2023-12-21 DIAGNOSIS — F329 Major depressive disorder, single episode, unspecified: Secondary | ICD-10-CM | POA: Diagnosis not present

## 2023-12-21 DIAGNOSIS — Z9181 History of falling: Secondary | ICD-10-CM | POA: Diagnosis not present

## 2023-12-21 DIAGNOSIS — F419 Anxiety disorder, unspecified: Secondary | ICD-10-CM | POA: Diagnosis not present

## 2023-12-21 DIAGNOSIS — R2681 Unsteadiness on feet: Secondary | ICD-10-CM | POA: Diagnosis not present

## 2023-12-22 ENCOUNTER — Emergency Department (HOSPITAL_COMMUNITY): Payer: Medicare PPO

## 2023-12-22 ENCOUNTER — Emergency Department (HOSPITAL_COMMUNITY)
Admission: EM | Admit: 2023-12-22 | Discharge: 2023-12-22 | Disposition: A | Discharge status: Skilled Nursing Facility | Payer: Medicare PPO | Attending: Emergency Medicine | Admitting: Emergency Medicine

## 2023-12-22 DIAGNOSIS — M79606 Pain in leg, unspecified: Secondary | ICD-10-CM | POA: Diagnosis not present

## 2023-12-22 DIAGNOSIS — Z853 Personal history of malignant neoplasm of breast: Secondary | ICD-10-CM | POA: Insufficient documentation

## 2023-12-22 DIAGNOSIS — W19XXXA Unspecified fall, initial encounter: Secondary | ICD-10-CM | POA: Diagnosis not present

## 2023-12-22 DIAGNOSIS — M25561 Pain in right knee: Secondary | ICD-10-CM | POA: Insufficient documentation

## 2023-12-22 DIAGNOSIS — Z91040 Latex allergy status: Secondary | ICD-10-CM | POA: Diagnosis not present

## 2023-12-22 DIAGNOSIS — F0390 Unspecified dementia without behavioral disturbance: Secondary | ICD-10-CM | POA: Insufficient documentation

## 2023-12-22 DIAGNOSIS — Z79899 Other long term (current) drug therapy: Secondary | ICD-10-CM | POA: Diagnosis not present

## 2023-12-22 DIAGNOSIS — M5020 Other cervical disc displacement, unspecified cervical region: Secondary | ICD-10-CM | POA: Diagnosis not present

## 2023-12-22 DIAGNOSIS — R531 Weakness: Secondary | ICD-10-CM | POA: Diagnosis not present

## 2023-12-22 DIAGNOSIS — Z7901 Long term (current) use of anticoagulants: Secondary | ICD-10-CM | POA: Diagnosis not present

## 2023-12-22 DIAGNOSIS — M25562 Pain in left knee: Secondary | ICD-10-CM | POA: Insufficient documentation

## 2023-12-22 DIAGNOSIS — I110 Hypertensive heart disease with heart failure: Secondary | ICD-10-CM | POA: Diagnosis not present

## 2023-12-22 DIAGNOSIS — M47812 Spondylosis without myelopathy or radiculopathy, cervical region: Secondary | ICD-10-CM | POA: Diagnosis not present

## 2023-12-22 DIAGNOSIS — Z7401 Bed confinement status: Secondary | ICD-10-CM | POA: Diagnosis not present

## 2023-12-22 DIAGNOSIS — I2510 Atherosclerotic heart disease of native coronary artery without angina pectoris: Secondary | ICD-10-CM | POA: Diagnosis not present

## 2023-12-22 DIAGNOSIS — I509 Heart failure, unspecified: Secondary | ICD-10-CM | POA: Diagnosis not present

## 2023-12-22 DIAGNOSIS — R410 Disorientation, unspecified: Secondary | ICD-10-CM | POA: Diagnosis not present

## 2023-12-22 DIAGNOSIS — M25519 Pain in unspecified shoulder: Secondary | ICD-10-CM | POA: Diagnosis not present

## 2023-12-22 DIAGNOSIS — Z8673 Personal history of transient ischemic attack (TIA), and cerebral infarction without residual deficits: Secondary | ICD-10-CM | POA: Insufficient documentation

## 2023-12-22 DIAGNOSIS — I10 Essential (primary) hypertension: Secondary | ICD-10-CM | POA: Diagnosis not present

## 2023-12-22 DIAGNOSIS — S0990XA Unspecified injury of head, initial encounter: Secondary | ICD-10-CM | POA: Diagnosis not present

## 2023-12-22 DIAGNOSIS — S199XXA Unspecified injury of neck, initial encounter: Secondary | ICD-10-CM | POA: Diagnosis not present

## 2023-12-22 LAB — BASIC METABOLIC PANEL
Anion gap: 7 (ref 5–15)
BUN: 24 mg/dL — ABNORMAL HIGH (ref 8–23)
CO2: 22 mmol/L (ref 22–32)
Calcium: 10.2 mg/dL (ref 8.9–10.3)
Chloride: 103 mmol/L (ref 98–111)
Creatinine, Ser: 1.27 mg/dL — ABNORMAL HIGH (ref 0.44–1.00)
GFR, Estimated: 45 mL/min — ABNORMAL LOW (ref >60)
Glucose, Bld: 111 mg/dL — ABNORMAL HIGH (ref 70–99)
Potassium: 4.7 mmol/L (ref 3.5–5.1)
Sodium: 132 mmol/L — ABNORMAL LOW (ref 135–145)

## 2023-12-22 LAB — CBC
HCT: 32.9 % — ABNORMAL LOW (ref 36.0–46.0)
Hemoglobin: 11.1 g/dL — ABNORMAL LOW (ref 12.0–15.0)
MCH: 31.6 pg (ref 26.0–34.0)
MCHC: 33.7 g/dL (ref 30.0–36.0)
MCV: 93.7 fL (ref 80.0–100.0)
Platelets: 303 10*3/uL (ref 150–400)
RBC: 3.51 MIL/uL — ABNORMAL LOW (ref 3.87–5.11)
RDW: 13.5 % (ref 11.5–15.5)
WBC: 9.7 10*3/uL (ref 4.0–10.5)
nRBC: 0 % (ref 0.0–0.2)

## 2023-12-22 LAB — RESP PANEL BY RT-PCR (RSV, FLU A&B, COVID)  RVPGX2
Influenza A by PCR: NEGATIVE
Influenza B by PCR: NEGATIVE
Resp Syncytial Virus by PCR: NEGATIVE
SARS Coronavirus 2 by RT PCR: NEGATIVE

## 2023-12-22 MED ORDER — SODIUM CHLORIDE 0.9 % IV BOLUS
500.0000 mL | Freq: Once | INTRAVENOUS | Status: AC
  Administered 2023-12-22: 500 mL via INTRAVENOUS

## 2023-12-22 NOTE — ED Notes (Signed)
Trauma Response Nurse Documentation   Sarah Cook is a 73 y.o. female arriving to [facility] ED via EMS  On Eliquis. Trauma was activated as a Level 2 by EDP/PA based on the following trauma criteria Elderly patients > 65 with head trauma on anti-coagulation (excluding ASA).  Patient cleared for CT by Dr. Suezanne Jacquet. Pt transported to CT with Primary  nurse present to monitor. RN remained with the patient throughout their absence from the department for clinical observation.   GCS 14. -- pt has hx of dementia is unable to answer if she hit her head. Activation after arrival    History   Past Medical History:  Diagnosis Date   Allergy    Anxiety    Breast cancer (HCC)    Cataract    early signs of   Gait abnormality    Heart disease    Hemorrhoids    Hiatal hernia 11/07/2012   large   Hyperlipidemia    Hyperparathyroidism (HCC)    Hypertension    Memory loss    Osteoporosis    Parathyroid disorder (HCC)    Pulmonary nodules    TIA (transient ischemic attack)    Urinary incontinence      Past Surgical History:  Procedure Laterality Date   BLADDER SUSPENSION     CESAREAN SECTION     x 2   CHOLECYSTECTOMY     MASTECTOMY Right    PARATHYROIDECTOMY     TOTAL ELBOW REPLACEMENT     TUBAL LIGATION         CT's Completed:   CT Head and CT C-Spine   Interventions:  Labs Xray CT  IV Plan for disposition:  Discharge home    Event Summary: Pt arrived to ED at 0825-- per EMS-- Activation at 1022-- had been seen by EDP/PA already. I met the primary RN and pt as they were finishing CT scans. Pt is in hallway 18- manual BP taken, is on a portable heart monitor.  Hx of dementia- unable to answer questions as to incident or if there was any head injury.   Raymar, RN - primary RN    Hewitt Shorts  Trauma Response RN  Please call TRN at [phone] for further assistance.

## 2023-12-22 NOTE — ED Notes (Signed)
Patient transported to X-ray

## 2023-12-22 NOTE — ED Triage Notes (Signed)
Pt BIB [facility] from [facility] due to fall.  Pt was getting ready for breakfast and staff found on floor.  Unknown if patient LOC or hit head.  EMS placed C-collar in place.  Hx dementia.  Patient does take Eliquis.  20g right AC.  VS BP 140/70, HR 96, SpO2 97% [id number]

## 2023-12-22 NOTE — ED Notes (Signed)
Patient was cleaned, dried, and repositioned.

## 2023-12-22 NOTE — Discharge Instructions (Addendum)
Sarah Cook was seen in emergency room today for fall.  Head CT and neck CT without any abnormality.  No medication changes were made.  Return to emergency room with new or worsening symptoms.  Follow-up with primary care doctor.

## 2023-12-22 NOTE — ED Provider Notes (Signed)
[place] EMERGENCY DEPARTMENT AT [facility] Provider Note   CSN: [id number] Arrival date & time: 12/22/23  7829     History  Chief Complaint  Patient presents with   [name]    Sarah Cook is a 73 y.o. female with past medical history of breast cancer, hyperparathyroidism, hyperlipidemia, dementia, urinary incontinence, coronary artery disease, CHF, PE on Eliquis to emergency room after fall.  Patient resides at facility.  Facility reports that they got her up and ready for breakfast and symptomatic for room.  By the time they came back pain minutes later they found her on the floor.  Fall was unwitnessed.  They did not note any injuries at that time.  Patient was not complaining of any pain.  Patient acting at baseline for them. Alert to person. Fall on thinners. Complains of knee pain. Denies headache, chest pain, abdominal pain.    Fall       Home Medications Prior to Admission medications   Medication Sig Start Date End Date Taking? Authorizing Provider  acetaminophen (TYLENOL) 325 MG tablet Take 2 tablets (650 mg total) by mouth every 6 (six) hours as needed for mild pain, fever or headache (or Fever >/= 101). 03/26/22   Elgergawy, Leana Roe, MD  apixaban (ELIQUIS) 5 MG TABS tablet Take 1 tablet (5 mg total) by mouth 2 (two) times daily. 12/07/21   Almon Hercules, MD  cholecalciferol (VITAMIN D) 25 MCG (1000 UNIT) tablet Take 2,000 Units by mouth daily.    [provider]  Coenzyme Q10 (CO Q 10) 100 MG CAPS Take 100 mg by mouth every evening.    [provider]  docusate sodium (COLACE) 100 MG capsule Take 100 mg by mouth every evening.    [provider]  feeding supplement (ENSURE ENLIVE / ENSURE PLUS) LIQD Take 237 mLs by mouth 2 (two) times daily between meals. Patient not taking: Reported on 03/25/2022 10/21/21   Joycelyn Das, MD  hydrALAZINE (APRESOLINE) 50 MG tablet Take 1 tablet (50 mg total) by mouth 3 (three) times daily with  meals. 04/01/22   Elgergawy, Leana Roe, MD  irbesartan (AVAPRO) 300 MG tablet Take 1 tablet (300 mg total) by mouth daily. 03/30/22   Elgergawy, Leana Roe, MD  Lactobacillus-Inulin (CULTURELLE DIGESTIVE DAILY PO) Take 1 capsule by mouth daily.    [provider]  lamoTRIgine (LAMICTAL) 100 MG tablet Take 1 tablet (100 mg total) by mouth 2 (two) times daily. 06/23/21   Levert Feinstein, MD  magnesium oxide (MAG-OX) 400 MG tablet Take 400 mg by mouth every evening.    [provider]  Multiple Vitamin (MULTI VITAMIN DAILY PO) Take 1 tablet by mouth daily. Patient not taking: Reported on 03/25/2022    [provider]  potassium chloride SA (KLOR-CON M) 20 MEQ tablet Take 1 tablet (20 mEq total) by mouth 2 (two) times daily for 15 days. 01/27/22 05/14/22  Glade Lloyd, MD  risperiDONE (RISPERDAL) 0.5 MG tablet Take 1 tablet (0.5 mg total) by mouth at bedtime. 10/02/23   Linwood Dibbles, MD  rosuvastatin (CRESTOR) 10 MG tablet Take 10 mg by mouth daily. 06/18/21   [provider]  saccharomyces boulardii (FLORASTOR) 250 MG capsule Take 250 mg by mouth daily.    [provider]  vitamin C (ASCORBIC ACID) 500 MG tablet Take 1,000 mg by mouth daily.    [provider]      Allergies    Anesthetics, ester; Codeine; Tape; Hydrochlorothiazide; Hydrocodone; Norvasc [  amlodipine]; Zyrtec [cetirizine hcl]; and Latex    Review of Systems   Review of Systems  Musculoskeletal:  Positive for arthralgias.    Physical Exam Updated Vital Signs BP (!) 146/60 (BP Location: Left Arm)   Pulse 91   Temp 98.8 F (37.1 C) (Oral)   Resp 16   SpO2 97%  Physical Exam Vitals and nursing note reviewed.  Constitutional:      General: She is not in acute distress.    Appearance: She is not toxic-appearing.     Comments: Confused, alert to person. Answering basic questions. Following basic commands. Resting comfortably in C-Collar.   HENT:     Head: Normocephalic and atraumatic.   Eyes:     General: No scleral icterus.    Conjunctiva/sclera: Conjunctivae normal.  Neck:     Comments: No cervical midline tenderness. Appears atraumatic.  Cardiovascular:     Rate and Rhythm: Normal rate and regular rhythm.     Pulses: Normal pulses.     Heart sounds: Normal heart sounds.  Pulmonary:     Effort: Pulmonary effort is normal. No respiratory distress.     Breath sounds: Normal breath sounds.  Abdominal:     General: Abdomen is flat. Bowel sounds are normal.     Palpations: Abdomen is soft.     Tenderness: There is no abdominal tenderness.  Musculoskeletal:     Right lower leg: No edema.     Left lower leg: No edema.     Comments: Moving extremity without difficulty. Complains on b/l knee pain to palpation.   Skin:    General: Skin is warm and dry.     Findings: No lesion.  Neurological:     General: No focal deficit present.     Mental Status: She is alert and oriented to person, place, and time. Mental status is at baseline.     ED Results / Procedures / Treatments   Labs (all labs ordered are listed, but only abnormal results are displayed) Labs Reviewed  RESP PANEL BY RT-PCR (RSV, FLU A&B, COVID)  RVPGX2  BASIC METABOLIC PANEL  CBC  URINALYSIS, ROUTINE W REFLEX MICROSCOPIC    EKG None  Radiology No results found.  Procedures Procedures    Medications Ordered in ED Medications - No data to display  ED Course/ Medical Decision Making/ A&P                                 Medical Decision Making Amount and/or Complexity of Data Reviewed Labs: ordered. Radiology: ordered.   [name] 73 y.o. presented today for fall. Working DDx that I considered at this time includes, but not limited to, intracranial hemorrhage, subdural/epidural hematoma, vertebral fracture, spinal cord injury, muscle strain, skull fracture, fracture, splenic injury, liver injury, perforated viscus, contusions.  R/o DDx: These diagnoses are less consistent than  current impression due to findings on history of present illness, physical exam, labs/imaging findings.  Review of prior external notes: 10/02/23 ED visit   Pmhx: breast cancer, hyperparathyroidism, hyperlipidemia, dementia, urinary incontinence, coronary artery disease, CHF, PE on Eliquis  Unique Tests and My Interpretation:  CBC without leukocytosis, hemoglobin stable at 11.1, BMP without significant electrolyte abnormality.  BUN and creatinine are slightly elevated from prior recent labs doubt significant change in GFR.  Will give 500 mL of saline.  Respiratory panel is negative  Imaging: Unwitnessed fall on Eliquis. Placed in c-collar with CT  head and neck. Endorses bilateral knee pain. Right and left knee x-ray.  Imaging without acute findings.   Problem List / ED Course / Critical interventions / Medication management  Patient reporting as fall on Eliquis.  Patient was a level 2 trauma.  Upon initial assessment she has GCS of 14 she is confused and demented at baseline.  Nursing home reports that she is at baseline.  She is hemodynamically stable not tachycardic not hypoxic.  Exam appears atraumatic.  She is able to follow basic commands.  No focal neurological deficit.  Does report bilateral knee pain thus I will obtain imaging.  Denies any chest pain or shortness of breath.  Denies any neck pain or headache.  Did obtain imaging given that she is on Eliquis and had a fall that was unwitnessed so it is unknown if she hit her head.  CT head and CT neck without any acute abnormality.  Given reassuring imaging feel patient is stable for discharge home.  Discussed case with attending Dr. Who agrees to plan. I ordered medication including NS Reevaluation of the patient after these medicines showed that the patient stayed the same Patients vitals assessed. Upon arrival patient is hemodynamically stable.  I have reviewed the patients home medicines and have made adjustments as needed    Consult:  None  Plan:  Patient had normal imaging.  She is acting at baseline.  She is well-appearing, atraumatic.  Vital stable including she is not hypoxic, not tachycardic. C-collar was removed from patient.  Patient stable for discharge.  Discharge instructions and return precautions written on paperwork.         Final Clinical Impression(s) / ED Diagnoses Final diagnoses:  Fall, initial encounter    Rx / DC Orders ED Discharge Orders     None         Raford Pitcher, Evalee Jefferson 12/22/23 1824    Lonell Grandchild, MD 12/23/23 [phone]

## 2023-12-22 NOTE — Progress Notes (Signed)
Orthopedic Tech Progress Note Patient Details:  Sarah Cook 1950-12-23 [id number] Level 2 Trauma, not needed. Patient ID: [name], female   DOB: 02-Mar-1951, 73 y.o.   MRN: [id number]  Lovett Calender 12/22/2023, 10:44 AM

## 2023-12-22 NOTE — ED Notes (Signed)
Patient removed her own c-collar, had been redirected several times, patient still removed it.

## 2023-12-22 NOTE — ED Notes (Signed)
Attempted to call report to [facility]. No answer.

## 2023-12-27 DIAGNOSIS — M6281 Muscle weakness (generalized): Secondary | ICD-10-CM | POA: Diagnosis not present

## 2023-12-27 DIAGNOSIS — R2689 Other abnormalities of gait and mobility: Secondary | ICD-10-CM | POA: Diagnosis not present

## 2023-12-27 DIAGNOSIS — Z9181 History of falling: Secondary | ICD-10-CM | POA: Diagnosis not present

## 2023-12-27 DIAGNOSIS — W1830XA Fall on same level, unspecified, initial encounter: Secondary | ICD-10-CM | POA: Diagnosis not present

## 2023-12-27 DIAGNOSIS — F0390 Unspecified dementia without behavioral disturbance: Secondary | ICD-10-CM | POA: Diagnosis not present

## 2023-12-27 DIAGNOSIS — R627 Adult failure to thrive: Secondary | ICD-10-CM | POA: Diagnosis not present

## 2023-12-28 DIAGNOSIS — Z9181 History of falling: Secondary | ICD-10-CM | POA: Diagnosis not present

## 2023-12-28 DIAGNOSIS — M6281 Muscle weakness (generalized): Secondary | ICD-10-CM | POA: Diagnosis not present

## 2023-12-28 DIAGNOSIS — R2681 Unsteadiness on feet: Secondary | ICD-10-CM | POA: Diagnosis not present

## 2023-12-29 DIAGNOSIS — R4701 Aphasia: Secondary | ICD-10-CM | POA: Diagnosis not present

## 2023-12-29 DIAGNOSIS — R41841 Cognitive communication deficit: Secondary | ICD-10-CM | POA: Diagnosis not present

## 2024-01-02 DIAGNOSIS — Z9181 History of falling: Secondary | ICD-10-CM | POA: Diagnosis not present

## 2024-01-02 DIAGNOSIS — M6281 Muscle weakness (generalized): Secondary | ICD-10-CM | POA: Diagnosis not present

## 2024-01-03 DIAGNOSIS — Z713 Dietary counseling and surveillance: Secondary | ICD-10-CM | POA: Diagnosis not present

## 2024-01-04 DIAGNOSIS — R4182 Altered mental status, unspecified: Secondary | ICD-10-CM | POA: Diagnosis not present

## 2024-01-04 DIAGNOSIS — R41841 Cognitive communication deficit: Secondary | ICD-10-CM | POA: Diagnosis not present

## 2024-01-04 DIAGNOSIS — R4701 Aphasia: Secondary | ICD-10-CM | POA: Diagnosis not present

## 2024-01-04 DIAGNOSIS — E785 Hyperlipidemia, unspecified: Secondary | ICD-10-CM | POA: Diagnosis not present

## 2024-01-08 DIAGNOSIS — Z9181 History of falling: Secondary | ICD-10-CM | POA: Diagnosis not present

## 2024-01-08 DIAGNOSIS — M6281 Muscle weakness (generalized): Secondary | ICD-10-CM | POA: Diagnosis not present

## 2024-01-10 DIAGNOSIS — M6281 Muscle weakness (generalized): Secondary | ICD-10-CM | POA: Diagnosis not present

## 2024-01-10 DIAGNOSIS — Z9181 History of falling: Secondary | ICD-10-CM | POA: Diagnosis not present

## 2024-01-10 DIAGNOSIS — F0390 Unspecified dementia without behavioral disturbance: Secondary | ICD-10-CM | POA: Diagnosis not present

## 2024-01-12 DIAGNOSIS — H1031 Unspecified acute conjunctivitis, right eye: Secondary | ICD-10-CM | POA: Diagnosis not present

## 2024-01-12 DIAGNOSIS — R4701 Aphasia: Secondary | ICD-10-CM | POA: Diagnosis not present

## 2024-01-12 DIAGNOSIS — R41841 Cognitive communication deficit: Secondary | ICD-10-CM | POA: Diagnosis not present

## 2024-01-15 DIAGNOSIS — R4701 Aphasia: Secondary | ICD-10-CM | POA: Diagnosis not present

## 2024-01-15 DIAGNOSIS — Z9181 History of falling: Secondary | ICD-10-CM | POA: Diagnosis not present

## 2024-01-15 DIAGNOSIS — M6281 Muscle weakness (generalized): Secondary | ICD-10-CM | POA: Diagnosis not present

## 2024-01-15 DIAGNOSIS — R41841 Cognitive communication deficit: Secondary | ICD-10-CM | POA: Diagnosis not present

## 2024-01-16 DIAGNOSIS — R2681 Unsteadiness on feet: Secondary | ICD-10-CM | POA: Diagnosis not present

## 2024-01-16 DIAGNOSIS — Z9181 History of falling: Secondary | ICD-10-CM | POA: Diagnosis not present

## 2024-01-16 DIAGNOSIS — M6281 Muscle weakness (generalized): Secondary | ICD-10-CM | POA: Diagnosis not present

## 2024-01-17 DIAGNOSIS — R4701 Aphasia: Secondary | ICD-10-CM | POA: Diagnosis not present

## 2024-01-17 DIAGNOSIS — F01C18 Vascular dementia, severe, with other behavioral disturbance: Secondary | ICD-10-CM | POA: Diagnosis not present

## 2024-01-17 DIAGNOSIS — R41841 Cognitive communication deficit: Secondary | ICD-10-CM | POA: Diagnosis not present

## 2024-01-17 DIAGNOSIS — Z9181 History of falling: Secondary | ICD-10-CM | POA: Diagnosis not present

## 2024-01-17 DIAGNOSIS — M6281 Muscle weakness (generalized): Secondary | ICD-10-CM | POA: Diagnosis not present

## 2024-01-17 DIAGNOSIS — F419 Anxiety disorder, unspecified: Secondary | ICD-10-CM | POA: Diagnosis not present

## 2024-01-17 DIAGNOSIS — F329 Major depressive disorder, single episode, unspecified: Secondary | ICD-10-CM | POA: Diagnosis not present

## 2024-01-17 DIAGNOSIS — H5789 Other specified disorders of eye and adnexa: Secondary | ICD-10-CM | POA: Diagnosis not present

## 2024-01-17 DIAGNOSIS — H1031 Unspecified acute conjunctivitis, right eye: Secondary | ICD-10-CM | POA: Diagnosis not present

## 2024-01-18 DIAGNOSIS — R2681 Unsteadiness on feet: Secondary | ICD-10-CM | POA: Diagnosis not present

## 2024-01-18 DIAGNOSIS — M6281 Muscle weakness (generalized): Secondary | ICD-10-CM | POA: Diagnosis not present

## 2024-01-18 DIAGNOSIS — Z9181 History of falling: Secondary | ICD-10-CM | POA: Diagnosis not present

## 2024-01-19 DIAGNOSIS — E039 Hypothyroidism, unspecified: Secondary | ICD-10-CM | POA: Diagnosis not present

## 2024-01-19 DIAGNOSIS — E213 Hyperparathyroidism, unspecified: Secondary | ICD-10-CM | POA: Diagnosis not present

## 2024-01-22 DIAGNOSIS — M6281 Muscle weakness (generalized): Secondary | ICD-10-CM | POA: Diagnosis not present

## 2024-01-22 DIAGNOSIS — R41841 Cognitive communication deficit: Secondary | ICD-10-CM | POA: Diagnosis not present

## 2024-01-22 DIAGNOSIS — Z9181 History of falling: Secondary | ICD-10-CM | POA: Diagnosis not present

## 2024-01-22 DIAGNOSIS — R4701 Aphasia: Secondary | ICD-10-CM | POA: Diagnosis not present

## 2024-01-23 DIAGNOSIS — M6281 Muscle weakness (generalized): Secondary | ICD-10-CM | POA: Diagnosis not present

## 2024-01-23 DIAGNOSIS — Z9181 History of falling: Secondary | ICD-10-CM | POA: Diagnosis not present

## 2024-01-23 DIAGNOSIS — R2681 Unsteadiness on feet: Secondary | ICD-10-CM | POA: Diagnosis not present

## 2024-01-24 DIAGNOSIS — E213 Hyperparathyroidism, unspecified: Secondary | ICD-10-CM | POA: Diagnosis not present

## 2024-01-25 DIAGNOSIS — R2681 Unsteadiness on feet: Secondary | ICD-10-CM | POA: Diagnosis not present

## 2024-01-25 DIAGNOSIS — M6281 Muscle weakness (generalized): Secondary | ICD-10-CM | POA: Diagnosis not present

## 2024-01-25 DIAGNOSIS — Z9181 History of falling: Secondary | ICD-10-CM | POA: Diagnosis not present

## 2024-01-29 DIAGNOSIS — R41841 Cognitive communication deficit: Secondary | ICD-10-CM | POA: Diagnosis not present

## 2024-01-29 DIAGNOSIS — R4701 Aphasia: Secondary | ICD-10-CM | POA: Diagnosis not present

## 2024-01-30 DIAGNOSIS — Z9181 History of falling: Secondary | ICD-10-CM | POA: Diagnosis not present

## 2024-01-30 DIAGNOSIS — M6281 Muscle weakness (generalized): Secondary | ICD-10-CM | POA: Diagnosis not present

## 2024-01-31 ENCOUNTER — Emergency Department (HOSPITAL_COMMUNITY)
Admission: EM | Admit: 2024-01-31 | Discharge: 2024-01-31 | Disposition: A | Discharge status: Home / Self Care | Attending: Emergency Medicine | Admitting: Emergency Medicine

## 2024-01-31 ENCOUNTER — Other Ambulatory Visit: Payer: Self-pay

## 2024-01-31 ENCOUNTER — Encounter (HOSPITAL_COMMUNITY): Payer: Self-pay

## 2024-01-31 ENCOUNTER — Emergency Department (HOSPITAL_COMMUNITY)

## 2024-01-31 DIAGNOSIS — Z7901 Long term (current) use of anticoagulants: Secondary | ICD-10-CM | POA: Diagnosis not present

## 2024-01-31 DIAGNOSIS — W01198A Fall on same level from slipping, tripping and stumbling with subsequent striking against other object, initial encounter: Secondary | ICD-10-CM | POA: Diagnosis not present

## 2024-01-31 DIAGNOSIS — W19XXXA Unspecified fall, initial encounter: Secondary | ICD-10-CM

## 2024-01-31 DIAGNOSIS — R9089 Other abnormal findings on diagnostic imaging of central nervous system: Secondary | ICD-10-CM | POA: Diagnosis not present

## 2024-01-31 DIAGNOSIS — S0990XA Unspecified injury of head, initial encounter: Secondary | ICD-10-CM | POA: Diagnosis not present

## 2024-01-31 DIAGNOSIS — Z91040 Latex allergy status: Secondary | ICD-10-CM | POA: Insufficient documentation

## 2024-01-31 DIAGNOSIS — Z743 Need for continuous supervision: Secondary | ICD-10-CM | POA: Diagnosis not present

## 2024-01-31 DIAGNOSIS — F0390 Unspecified dementia without behavioral disturbance: Secondary | ICD-10-CM | POA: Insufficient documentation

## 2024-01-31 DIAGNOSIS — I10 Essential (primary) hypertension: Secondary | ICD-10-CM | POA: Diagnosis not present

## 2024-01-31 DIAGNOSIS — Y92129 Unspecified place in nursing home as the place of occurrence of the external cause: Secondary | ICD-10-CM | POA: Diagnosis not present

## 2024-01-31 DIAGNOSIS — I6782 Cerebral ischemia: Secondary | ICD-10-CM | POA: Diagnosis not present

## 2024-01-31 DIAGNOSIS — E049 Nontoxic goiter, unspecified: Secondary | ICD-10-CM | POA: Diagnosis not present

## 2024-01-31 NOTE — Discharge Instructions (Addendum)
 As discussed, your evaluation today has been largely reassuring.  But, it is important that you monitor your condition carefully, and do not hesitate to return to the ED if you develop new, or concerning changes in your condition.  Otherwise, please follow-up with your physician for appropriate ongoing care.  CT scan of the neck did raise some concerns about maybe a slightly enlarged thyroid gland.  Did recommend outpatient ultrasound of that.  Otherwise CT head and neck without any acute findings.

## 2024-01-31 NOTE — ED Triage Notes (Signed)
 Pt bib ems from [facility]; pt had witnessed fall, staff reports pt fell back while reaching for walker, hit back of head; pt on Eliquis; no obvious injury; pt mentating at baseline per staff, oriented to self only; hx dementia; pt no c/o pain; HR 96, 98% RA, cbg 107, 160/98; c collar in place

## 2024-01-31 NOTE — ED Provider Notes (Signed)
 [place] EMERGENCY DEPARTMENT AT [facility] Provider Note   CSN: [id number] Arrival date & time: 01/31/24  1419     History {Add pertinent medical, surgical, social history, OB history to HPI:1} No chief complaint on file.   Sarah Cook is a 73 y.o. female.  HPI Presents via EMS after a witnessed fall at her nursing facility.  Patient herself has dementia, level 5 caveat.  Per report patient stumbled while trying to get to her walker, fell backward striking her head.  No reported loss of consciousness.  EMS reports, that staff at the facility noted the patient was interacting in a typical manner both before and after the event.  No hemodynamic instability in transport.    Home Medications Prior to Admission medications   Medication Sig Start Date End Date Taking? Authorizing Provider  acetaminophen (TYLENOL) 325 MG tablet Take 2 tablets (650 mg total) by mouth every 6 (six) hours as needed for mild pain, fever or headache (or Fever >/= 101). 03/26/22   Elgergawy, Leana Roe, MD  apixaban (ELIQUIS) 5 MG TABS tablet Take 1 tablet (5 mg total) by mouth 2 (two) times daily. 12/07/21   Almon Hercules, MD  cholecalciferol (VITAMIN D) 25 MCG (1000 UNIT) tablet Take 2,000 Units by mouth daily.    [provider]  Coenzyme Q10 (CO Q 10) 100 MG CAPS Take 100 mg by mouth every evening.    [provider]  docusate sodium (COLACE) 100 MG capsule Take 100 mg by mouth every evening.    [provider]  feeding supplement (ENSURE ENLIVE / ENSURE PLUS) LIQD Take 237 mLs by mouth 2 (two) times daily between meals. Patient not taking: Reported on 03/25/2022 10/21/21   Joycelyn Das, MD  hydrALAZINE (APRESOLINE) 50 MG tablet Take 1 tablet (50 mg total) by mouth 3 (three) times daily with meals. 04/01/22   Elgergawy, Leana Roe, MD  irbesartan (AVAPRO) 300 MG tablet Take 1 tablet (300 mg total) by mouth daily. 03/30/22   Elgergawy, Leana Roe, MD  Lactobacillus-Inulin  (CULTURELLE DIGESTIVE DAILY PO) Take 1 capsule by mouth daily.    [provider]  lamoTRIgine (LAMICTAL) 100 MG tablet Take 1 tablet (100 mg total) by mouth 2 (two) times daily. 06/23/21   Levert Feinstein, MD  magnesium oxide (MAG-OX) 400 MG tablet Take 400 mg by mouth every evening.    [provider]  Multiple Vitamin (MULTI VITAMIN DAILY PO) Take 1 tablet by mouth daily. Patient not taking: Reported on 03/25/2022    [provider]  potassium chloride SA (KLOR-CON M) 20 MEQ tablet Take 1 tablet (20 mEq total) by mouth 2 (two) times daily for 15 days. 01/27/22 05/14/22  Glade Lloyd, MD  risperiDONE (RISPERDAL) 0.5 MG tablet Take 1 tablet (0.5 mg total) by mouth at bedtime. 10/02/23   Linwood Dibbles, MD  rosuvastatin (CRESTOR) 10 MG tablet Take 10 mg by mouth daily. 06/18/21   [provider]  saccharomyces boulardii (FLORASTOR) 250 MG capsule Take 250 mg by mouth daily.    [provider]  vitamin C (ASCORBIC ACID) 500 MG tablet Take 1,000 mg by mouth daily.    [provider]      Allergies    Anesthetics, ester; Codeine; Tape; Hydrochlorothiazide; Hydrocodone; Norvasc [amlodipine]; Zyrtec [cetirizine hcl]; and Latex    Review of Systems   Review of Systems  Physical Exam Updated Vital Signs BP (!) 142/84   SpO2 98%  Physical Exam Vitals and nursing  note reviewed.  Constitutional:      General: She is not in acute distress.    Appearance: She is well-developed.  HENT:     Head: Normocephalic and atraumatic.   Eyes:     Conjunctiva/sclera: Conjunctivae normal.  Cardiovascular:     Rate and Rhythm: Normal rate and regular rhythm.  Pulmonary:     Effort: Pulmonary effort is normal. No respiratory distress.     Breath sounds: Normal breath sounds. No stridor.  Abdominal:     General: There is no distension.  Skin:    General: Skin is warm and dry.  Neurological:     Mental Status: She is alert.     Cranial Nerves: No cranial nerve  deficit.     Motor: Atrophy present.     Comments: Patient is slow to answer questions, slow to follow commands, but does move each extremity appropriately to commands without apparent difficulty, but with notable atrophy.  Psychiatric:        Mood and Affect: Mood normal.        Cognition and Memory: Cognition is impaired. Memory is impaired.    ED Results / Procedures / Treatments   Labs (all labs ordered are listed, but only abnormal results are displayed) Labs Reviewed - No data to display  EKG None  Radiology No results found.  Procedures Procedures  {Document cardiac monitor, telemetry assessment procedure when appropriate:1}  Medications Ordered in ED Medications - No data to display  ED Course/ Medical Decision Making/ A&P   {   Click here for ABCD2, HEART and other calculatorsREFRESH Note before signing :1}                              Medical Decision Making Utterly female presents after witnessed fall.  Patient's dementia is somewhat limiting, but history by EMS, and physical exam revealed generally reassuring.  However, given the patient's advanced age, head trauma, concern for fracture versus hemorrhage versus contusion versus concussion.  With no hip pain, no obvious extremity abnormalities, no focal other neurodeficits, patient will have head CT, neck CT, was monitored. Pulse ox 99% room air normal  Amount and/or Complexity of Data Reviewed Independent Historian: EMS External Data Reviewed: notes. Radiology: ordered and independent interpretation performed. Decision-making details documented in ED Course.  Risk Decision regarding hospitalization. Diagnosis or treatment significantly limited by social determinants of health.   Update: Patient in similar condition, head CT, neck CT reviewed, both without acute findings, cervical collar removed, patient seemingly does not complain of any neck pain.  Given reassuring findings on CT, no change in monitoring  during her ED course, patient will return to her nursing facility.   {Document critical care time when appropriate:1} {Document review of labs and clinical decision tools ie heart score, Chads2Vasc2 etc:1}  {Document your independent review of radiology images, and any outside records:1} {Document your discussion with family members, caretakers, and with consultants:1} {Document social determinants of health affecting pt's care:1} {Document your decision making why or why not admission, treatments were needed:1} Final Clinical Impression(s) / ED Diagnoses Final diagnoses:  Fall, initial encounter

## 2024-01-31 NOTE — ED Provider Notes (Signed)
 CT head and neck without any acute findings.  There was some slight abnormality noted in the thyroid area.  Will make nursing home aware of that.  Ultrasound could be ordered as an outpatient.  The thyroid was mildly enlarged.   Vanetta Mulders, MD 01/31/24 1743

## 2024-01-31 NOTE — Progress Notes (Signed)
 Orthopedic Tech Progress Note Patient Details:  Sarah Cook 03/25/1951 [id number]  Level 2 trauma   Patient ID: [name], female   DOB: 17-Apr-1951, 73 y.o.   MRN: [id number]  Donald Pore 01/31/2024, 2:30 PM

## 2024-02-01 DIAGNOSIS — Z9181 History of falling: Secondary | ICD-10-CM | POA: Diagnosis not present

## 2024-02-01 DIAGNOSIS — M6281 Muscle weakness (generalized): Secondary | ICD-10-CM | POA: Diagnosis not present

## 2024-02-02 ENCOUNTER — Other Ambulatory Visit: Payer: Self-pay

## 2024-02-02 ENCOUNTER — Emergency Department (HOSPITAL_COMMUNITY)
Admission: EM | Admit: 2024-02-02 | Discharge: 2024-02-02 | Disposition: A | Discharge status: Home / Self Care | Attending: Emergency Medicine | Admitting: Emergency Medicine

## 2024-02-02 ENCOUNTER — Encounter (HOSPITAL_COMMUNITY): Payer: Self-pay | Admitting: Emergency Medicine

## 2024-02-02 ENCOUNTER — Emergency Department (HOSPITAL_COMMUNITY)

## 2024-02-02 DIAGNOSIS — I10 Essential (primary) hypertension: Secondary | ICD-10-CM | POA: Diagnosis not present

## 2024-02-02 DIAGNOSIS — I5032 Chronic diastolic (congestive) heart failure: Secondary | ICD-10-CM | POA: Diagnosis not present

## 2024-02-02 DIAGNOSIS — Z91040 Latex allergy status: Secondary | ICD-10-CM | POA: Insufficient documentation

## 2024-02-02 DIAGNOSIS — W19XXXA Unspecified fall, initial encounter: Secondary | ICD-10-CM | POA: Diagnosis not present

## 2024-02-02 DIAGNOSIS — Z8616 Personal history of COVID-19: Secondary | ICD-10-CM | POA: Insufficient documentation

## 2024-02-02 DIAGNOSIS — W1839XA Other fall on same level, initial encounter: Secondary | ICD-10-CM | POA: Diagnosis not present

## 2024-02-02 DIAGNOSIS — Z7901 Long term (current) use of anticoagulants: Secondary | ICD-10-CM | POA: Insufficient documentation

## 2024-02-02 DIAGNOSIS — Z79899 Other long term (current) drug therapy: Secondary | ICD-10-CM | POA: Diagnosis not present

## 2024-02-02 DIAGNOSIS — F0390 Unspecified dementia without behavioral disturbance: Secondary | ICD-10-CM | POA: Insufficient documentation

## 2024-02-02 DIAGNOSIS — R531 Weakness: Secondary | ICD-10-CM | POA: Diagnosis not present

## 2024-02-02 DIAGNOSIS — S0990XA Unspecified injury of head, initial encounter: Secondary | ICD-10-CM | POA: Diagnosis not present

## 2024-02-02 DIAGNOSIS — Z743 Need for continuous supervision: Secondary | ICD-10-CM | POA: Diagnosis not present

## 2024-02-02 DIAGNOSIS — S199XXA Unspecified injury of neck, initial encounter: Secondary | ICD-10-CM | POA: Diagnosis not present

## 2024-02-02 DIAGNOSIS — I110 Hypertensive heart disease with heart failure: Secondary | ICD-10-CM | POA: Diagnosis not present

## 2024-02-02 DIAGNOSIS — E041 Nontoxic single thyroid nodule: Secondary | ICD-10-CM | POA: Diagnosis not present

## 2024-02-02 DIAGNOSIS — Z853 Personal history of malignant neoplasm of breast: Secondary | ICD-10-CM | POA: Diagnosis not present

## 2024-02-02 DIAGNOSIS — I6782 Cerebral ischemia: Secondary | ICD-10-CM | POA: Diagnosis not present

## 2024-02-02 DIAGNOSIS — E042 Nontoxic multinodular goiter: Secondary | ICD-10-CM | POA: Diagnosis not present

## 2024-02-02 NOTE — Discharge Instructions (Signed)
 We evaluated Sarah Cook for her fall.  Her imaging was negative. Her scan does show an enlarged thyroid. She can follow up with her primary doctor for this.

## 2024-02-02 NOTE — ED Triage Notes (Signed)
 Pt arrived via ems from [facility] after an unwitnessed fall

## 2024-02-02 NOTE — ED Provider Notes (Signed)
 [place] EMERGENCY DEPARTMENT AT [facility] Provider Note  CSN: [id number] Arrival date & time: 02/02/24 1020  Chief Complaint(s) Fall  HPI Sarah Cook is a 73 y.o. female with history of dementia presenting to the emergency department with fall.  Patient is on Eliquis.  She was apparently trying to get into a chair when she fell.  Not witnessed by staff who found her on the floor, but they apparently heard and came right away.  Patient currently denies any complaints, reports nothing hurts.  History limited due to dementia.   Past Medical History Past Medical History:  Diagnosis Date   Allergy    Anxiety    Breast cancer (HCC)    Cataract    early signs of   Gait abnormality    Heart disease    Hemorrhoids    Hiatal hernia 11/07/2012   large   Hyperlipidemia    Hyperparathyroidism (HCC)    Hypertension    Memory loss    Osteoporosis    Parathyroid disorder (HCC)    Pulmonary nodules    TIA (transient ischemic attack)    Urinary incontinence    Patient Active Problem List   Diagnosis Date Noted   Acute metabolic encephalopathy 03/25/2022   COVID-19 virus infection 03/25/2022   Left supracondylar humerus fracture 03/25/2022   Severe sepsis (HCC) 03/24/2022   Generalized weakness 01/25/2022   Decreased oral intake 01/25/2022   History of pulmonary embolism 01/25/2022   Obesity (BMI 30-39.9) 01/25/2022   Malnutrition of moderate degree 01/25/2022   Pulmonary embolism (HCC) 11/07/2021   Acute encephalopathy 11/06/2021   Chronic diastolic CHF (congestive heart failure) (HCC) 11/06/2021   Anemia 11/06/2021   E-coli UTI 10/19/2021   Hypercalcemia 10/18/2021   Dementia without behavioral disturbance (HCC) 07/22/2021   Hypertensive urgency 07/21/2021   Refractory hypokalemia 07/20/2021   Generalized muscle weakness 07/20/2021   Encephalomalacia 07/20/2021   Urinary incontinence 07/20/2021   Hypophosphatemia 07/20/2021   Memory loss 06/23/2021    Confusion 06/23/2021   Encephalopathy acute 10/18/2017   Post-concussion headache 10/18/2017   Altered mental status 10/11/2017   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HCC) 03/09/2016   Chest pain 02/19/16   History of antineoplastic chemotherapy with cardiotoxic drugs February 19, 2016   Family history of sudden cardiac death in father February 19, 2016   History of breast cancer    Hemorrhoids    Hiatal hernia    Pulmonary nodules    Hyperparathyroidism (HCC)    Hypertension    Hyperlipidemia    Heart disease    Allergy    Mood disorder (HCC)    TIA (transient ischemic attack)    Cataract    Osteoporosis    Parathyroid disorder (HCC)    Irritable bowel syndrome 12/11/2013   Headache 12/10/2013   Home Medication(s) Prior to Admission medications   Medication Sig Start Date End Date Taking? Authorizing Provider  apixaban (ELIQUIS) 5 MG TABS tablet Take 1 tablet (5 mg total) by mouth 2 (two) times daily. 12/07/21  Yes Almon Hercules, MD  docusate sodium (COLACE) 100 MG capsule Take 100 mg by mouth every evening.   Yes [provider]  feeding supplement (ENSURE ENLIVE / ENSURE PLUS) LIQD Take 237 mLs by mouth 2 (two) times daily between meals. 10/21/21  Yes Pokhrel, Laxman, MD  hydrALAZINE (APRESOLINE) 50 MG tablet Take 50 mg by mouth in the morning and at bedtime.   Yes [provider]  irbesartan (AVAPRO) 300 MG tablet Take 1 tablet (  300 mg total) by mouth daily. 03/30/22  Yes Elgergawy, Leana Roe, MD  lamoTRIgine (LAMICTAL) 100 MG tablet Take 1 tablet (100 mg total) by mouth 2 (two) times daily. 06/23/21  Yes Levert Feinstein, MD  lamotrigine (LAMICTAL) 25 MG disintegrating tablet Take 50 mg by mouth at bedtime.   Yes [provider]  LORazepam (ATIVAN) 0.5 MG tablet Take 0.5 mg by mouth in the morning and at bedtime. 01/31/24  Yes [provider]  LORazepam (ATIVAN) 0.5 MG tablet Take 0.5 mg by mouth every 6 (six) hours as needed  (agitation/restlessness).   Yes [provider]                                                                                                                                    Past Surgical History Past Surgical History:  Procedure Laterality Date   BLADDER SUSPENSION     CESAREAN SECTION     x 2   CHOLECYSTECTOMY     MASTECTOMY Right    PARATHYROIDECTOMY     TOTAL ELBOW REPLACEMENT     TUBAL LIGATION     Family History Family History  Problem Relation Age of Onset   Breast cancer Mother    Sudden death Father    Heart failure Father    Hypertension Other    Heart disease Other    Colon cancer Neg Hx    Esophageal cancer Neg Hx    Stomach cancer Neg Hx    Rectal cancer Neg Hx     Social History Social History   Tobacco Use   Smoking status: Never   Smokeless tobacco: Never  Vaping Use   Vaping status: Never Used  Substance Use Topics   Alcohol use: No   Drug use: No   Allergies Anesthetics, ester; Codeine; Tape; Hydrochlorothiazide; Hydrocodone; Norvasc [amlodipine]; Zyrtec [cetirizine hcl]; and Latex  Review of Systems Review of Systems  All other systems reviewed and are negative.   Physical Exam Vital Signs  I have reviewed the triage vital signs BP 138/82   Pulse 81   Temp 97.6 F (36.4 C) (Oral)   Resp 16   Ht 5\' 5"  (1.651 m)   Wt 62.3 kg   SpO2 100%   BMI 22.86 kg/m  Physical Exam Vitals and nursing note reviewed.  Constitutional:      General: She is not in acute distress.    Appearance: She is well-developed.  HENT:     Head: Normocephalic.     Comments: Slight amount of redness over forehead. No wound    Mouth/Throat:     Mouth: Mucous membranes are moist.  Eyes:     Pupils: Pupils are equal, round, and reactive to light.  Cardiovascular:     Rate and Rhythm: Normal rate and regular rhythm.     Heart sounds: No murmur heard. Pulmonary:     Effort: Pulmonary effort is normal. No respiratory  distress.     Breath sounds:  Normal breath sounds.  Abdominal:     General: Abdomen is flat.     Palpations: Abdomen is soft.     Tenderness: There is no abdominal tenderness.  Musculoskeletal:        General: No tenderness.     Right lower leg: No edema.     Left lower leg: No edema.     Comments: No midline C, T, L-spine tenderness.  No chest wall tenderness or crepitus.  Full painless range of motion at the bilateral upper extremities including the shoulders, elbows, wrists, hand and fingers, and in the bilateral lower extremities including the hips, knees, ankle, toes.  No focal bony tenderness, injury or deformity.   Skin:    General: Skin is warm and dry.  Neurological:     General: No focal deficit present.     Mental Status: She is alert. Mental status is at baseline.     Comments: Follows commands, oriented to self, otherwise disoriented which seems baseline per EMS  Psychiatric:        Mood and Affect: Mood normal.        Behavior: Behavior normal.     ED Results and Treatments Labs (all labs ordered are listed, but only abnormal results are displayed) Labs Reviewed - No data to display                                                                                                                        Radiology CT Head Wo Contrast Result Date: 02/02/2024 CLINICAL DATA:  Head and neck trauma. EXAM: CT HEAD WITHOUT CONTRAST CT CERVICAL SPINE WITHOUT CONTRAST TECHNIQUE: Multidetector CT imaging of the head and cervical spine was performed following the standard protocol without intravenous contrast. Multiplanar CT image reconstructions of the cervical spine were also generated. RADIATION DOSE REDUCTION: This exam was performed according to the departmental dose-optimization program which includes automated exposure control, adjustment of the mA and/or kV according to patient size and/or use of iterative reconstruction technique. COMPARISON:  01/31/2024 FINDINGS: CT HEAD FINDINGS Brain: No acute  intracranial hemorrhage. No CT evidence of acute infarct. Nonspecific hypoattenuation in the periventricular and subcortical white matter favored to reflect chronic microvascular ischemic changes. No edema, mass effect, or midline shift. The basilar cisterns are patent. Ventricles: Prominence of the ventricles suggesting underlying parenchymal volume loss. Vascular: Atherosclerotic calcifications of the carotid siphons. No hyperdense vessel. Skull: No acute or aggressive finding. Orbits: Orbits are symmetric. Sinuses: Mild mucosal thickening in the ethmoid and maxillary sinuses. Other: Mastoid air cells are clear. CT CERVICAL SPINE FINDINGS Alignment: Straightening of the normal cervical lordosis. Similar trace anterolisthesis of C7 on T1 and T1 on T2. No facet subluxation or dislocation. Skull base and vertebrae: No compression fracture or displaced fracture in the cervical spine. No suspicious osseous lesion. Soft tissues and spinal canal: No prevertebral fluid or swelling. No visible canal hematoma. Similar heterogeneous and mildly enlarged appearance of the thyroid  with multiple nodules noted. The largest thyroid nodule measures up to 1.5 cm. Disc levels: Mild intervertebral disc space narrowing. No significant osseous spinal canal stenosis. Mild facet arthrosis at multiple levels. No high-grade osseous foraminal stenosis. Upper chest: Negative. Other: None. IMPRESSION: No CT evidence of acute intracranial abnormality. No acute fracture or traumatic malalignment of the cervical spine. Similar chronic and degenerative changes as above. Heterogeneous enlarged thyroid with multiple nodules. Recommend nonemergent thyroid ultrasound for further evaluation. Electronically Signed   By: Emily Filbert M.D.   On: 02/02/2024 11:13   CT Cervical Spine Wo Contrast Result Date: 02/02/2024 CLINICAL DATA:  Head and neck trauma. EXAM: CT HEAD WITHOUT CONTRAST CT CERVICAL SPINE WITHOUT CONTRAST TECHNIQUE: Multidetector CT  imaging of the head and cervical spine was performed following the standard protocol without intravenous contrast. Multiplanar CT image reconstructions of the cervical spine were also generated. RADIATION DOSE REDUCTION: This exam was performed according to the departmental dose-optimization program which includes automated exposure control, adjustment of the mA and/or kV according to patient size and/or use of iterative reconstruction technique. COMPARISON:  01/31/2024 FINDINGS: CT HEAD FINDINGS Brain: No acute intracranial hemorrhage. No CT evidence of acute infarct. Nonspecific hypoattenuation in the periventricular and subcortical white matter favored to reflect chronic microvascular ischemic changes. No edema, mass effect, or midline shift. The basilar cisterns are patent. Ventricles: Prominence of the ventricles suggesting underlying parenchymal volume loss. Vascular: Atherosclerotic calcifications of the carotid siphons. No hyperdense vessel. Skull: No acute or aggressive finding. Orbits: Orbits are symmetric. Sinuses: Mild mucosal thickening in the ethmoid and maxillary sinuses. Other: Mastoid air cells are clear. CT CERVICAL SPINE FINDINGS Alignment: Straightening of the normal cervical lordosis. Similar trace anterolisthesis of C7 on T1 and T1 on T2. No facet subluxation or dislocation. Skull base and vertebrae: No compression fracture or displaced fracture in the cervical spine. No suspicious osseous lesion. Soft tissues and spinal canal: No prevertebral fluid or swelling. No visible canal hematoma. Similar heterogeneous and mildly enlarged appearance of the thyroid with multiple nodules noted. The largest thyroid nodule measures up to 1.5 cm. Disc levels: Mild intervertebral disc space narrowing. No significant osseous spinal canal stenosis. Mild facet arthrosis at multiple levels. No high-grade osseous foraminal stenosis. Upper chest: Negative. Other: None. IMPRESSION: No CT evidence of acute  intracranial abnormality. No acute fracture or traumatic malalignment of the cervical spine. Similar chronic and degenerative changes as above. Heterogeneous enlarged thyroid with multiple nodules. Recommend nonemergent thyroid ultrasound for further evaluation. Electronically Signed   By: Emily Filbert M.D.   On: 02/02/2024 11:13    Pertinent labs & imaging results that were available during my care of the patient were reviewed by me and considered in my medical decision making (see MDM for details).  Medications Ordered in ED Medications - No data to display  Procedures Procedures  (including critical care time)  Medical Decision Making / ED Course   MDM:  73 year old presenting with unwitnessed fall.  Patient overall well-appearing, no obvious signs of trauma, some slight redness on the forehead which could be sign of a fall but no wound.  Patient is on Eliquis, will obtain CT head and CT cervical spine.  Physical exam otherwise without evidence of trauma patient denies any pain.  Will reassess.    Clinical Course as of 02/02/24 1256  Fri Feb 02, 2024  1251 Imaging negative for any acute injury.  CT does have multiple thyroid nodules.  Listed on discharge and paperwork for nursing facility to follow-up with this. [WS]    Clinical Course User Index [WS] Lonell Grandchild, MD     Additional history obtained: -Additional history obtained from ems -External records from outside source obtained and reviewed including: Chart review including previous notes, labs, imaging, consultation notes including prior notes    Imaging Studies ordered: I ordered imaging studies including CT head and cervical spine On my interpretation imaging demonstrates no acute injury  I independently visualized and interpreted imaging. I agree with the radiologist  interpretation   Medicines ordered and prescription drug management: No orders of the defined types were placed in this encounter.   -I have reviewed the patients home medicines and have made adjustments as needed   Reevaluation: After the interventions noted above, I reevaluated the patient and found that their symptoms have improved  Co morbidities that complicate the patient evaluation  Past Medical History:  Diagnosis Date   Allergy    Anxiety    Breast cancer (HCC)    Cataract    early signs of   Gait abnormality    Heart disease    Hemorrhoids    Hiatal hernia 11/07/2012   large   Hyperlipidemia    Hyperparathyroidism (HCC)    Hypertension    Memory loss    Osteoporosis    Parathyroid disorder (HCC)    Pulmonary nodules    TIA (transient ischemic attack)    Urinary incontinence       Dispostion: Disposition decision including need for hospitalization was considered, and patient discharged from emergency department.    Final Clinical Impression(s) / ED Diagnoses Final diagnoses:  Injury of head, initial encounter  Thyroid nodule     This chart was dictated using voice recognition software.  Despite best efforts to proofread,  errors can occur which can change the documentation meaning.    Lonell Grandchild, MD 02/02/24 1256

## 2024-02-02 NOTE — ED Notes (Signed)
 Patient transported to CT

## 2024-02-02 NOTE — ED Notes (Signed)
 Patient left with PTAR before updated vitals could be taken.

## 2024-02-02 NOTE — Progress Notes (Signed)
 Orthopedic Tech Progress Note Patient Details:  Sarah Cook 1950/11/18 [id number]  Patient ID: Sarah Cook, female   DOB: 1951-04-20, 73 y.o.   MRN: [id number] Level 2 trauma Tonye Pearson 02/02/2024, 12:42 PM

## 2024-02-05 DIAGNOSIS — R4701 Aphasia: Secondary | ICD-10-CM | POA: Diagnosis not present

## 2024-02-05 DIAGNOSIS — M6281 Muscle weakness (generalized): Secondary | ICD-10-CM | POA: Diagnosis not present

## 2024-02-05 DIAGNOSIS — R2681 Unsteadiness on feet: Secondary | ICD-10-CM | POA: Diagnosis not present

## 2024-02-05 DIAGNOSIS — R41841 Cognitive communication deficit: Secondary | ICD-10-CM | POA: Diagnosis not present

## 2024-02-05 DIAGNOSIS — Z9181 History of falling: Secondary | ICD-10-CM | POA: Diagnosis not present

## 2024-03-14 ENCOUNTER — Emergency Department (HOSPITAL_COMMUNITY)
Admission: EM | Admit: 2024-03-14 | Discharge: 2024-03-15 | Disposition: A | Discharge status: Home / Self Care | Attending: Emergency Medicine | Admitting: Emergency Medicine

## 2024-03-14 ENCOUNTER — Encounter (HOSPITAL_COMMUNITY): Payer: Self-pay

## 2024-03-14 ENCOUNTER — Other Ambulatory Visit: Payer: Self-pay

## 2024-03-14 ENCOUNTER — Emergency Department (HOSPITAL_COMMUNITY)

## 2024-03-14 DIAGNOSIS — S0003XA Contusion of scalp, initial encounter: Secondary | ICD-10-CM | POA: Insufficient documentation

## 2024-03-14 DIAGNOSIS — Z91040 Latex allergy status: Secondary | ICD-10-CM | POA: Insufficient documentation

## 2024-03-14 DIAGNOSIS — I10 Essential (primary) hypertension: Secondary | ICD-10-CM | POA: Diagnosis not present

## 2024-03-14 DIAGNOSIS — R030 Elevated blood-pressure reading, without diagnosis of hypertension: Secondary | ICD-10-CM

## 2024-03-14 DIAGNOSIS — S0093XA Contusion of unspecified part of head, initial encounter: Secondary | ICD-10-CM | POA: Diagnosis present

## 2024-03-14 DIAGNOSIS — F0390 Unspecified dementia without behavioral disturbance: Secondary | ICD-10-CM | POA: Diagnosis not present

## 2024-03-14 DIAGNOSIS — W19XXXA Unspecified fall, initial encounter: Secondary | ICD-10-CM | POA: Diagnosis not present

## 2024-03-14 MED ORDER — HYDRALAZINE HCL 25 MG PO TABS
50.0000 mg | ORAL_TABLET | Freq: Once | ORAL | Status: AC
Start: 2024-03-14 — End: 2024-03-14
  Administered 2024-03-14: 50 mg via ORAL
  Filled 2024-03-14: qty 2

## 2024-03-14 MED ORDER — ACETAMINOPHEN 325 MG PO TABS
650.0000 mg | ORAL_TABLET | Freq: Once | ORAL | Status: AC
  Administered 2024-03-14: 650 mg via ORAL
  Filled 2024-03-14: qty 2

## 2024-03-14 NOTE — Discharge Instructions (Addendum)
 It was our pleasure to provide your ER care today - we hope that you feel better.  Fall precautions - see attached info.   Follow up with primary care doctor in the coming week for recheck, and to discuss whether the benefit of continued anticoagulation therapy outweighs risk of fall and bleeding complications.   Return to ER if worse, new symptoms, fevers, new/severe pain, or other concern.

## 2024-03-14 NOTE — ED Triage Notes (Signed)
 Pt [facility] from nursing home. Had a unwitnessed fall, ems notes goofball size hematoma to back right of head. Hx dementia. Unable toolerate c- collar with ems. Bp 158/62 Hr 104 97% ra  Rr 20 Cbg 140

## 2024-03-14 NOTE — ED Provider Notes (Addendum)
 [place] EMERGENCY DEPARTMENT AT [facility] Provider Note   CSN: [id number] Arrival date & time: 03/14/24  1922     History  Chief Complaint  Patient presents with  . Fall    Sarah Cook is a 73 y.o. female.  Pt presents as level II trauma activation, fall on blood thinners, eliquis . Pt w dementia. No noted loc. Mental status described as c/w baseline. No vomiting. No bleeding/wound. Pt w advanced dementia, not able to provide history - level 5 caveat.   The history is provided by the patient, medical records and the EMS personnel. The history is limited by the condition of the patient.       Home Medications Prior to Admission medications   Medication Sig Start Date End Date Taking? Authorizing Provider  apixaban  (ELIQUIS ) 5 MG TABS tablet Take 1 tablet (5 mg total) by mouth 2 (two) times daily. 12/07/21   Gonfa, Taye T, MD  docusate sodium  (COLACE) 100 MG capsule Take 100 mg by mouth every evening.    [provider]  feeding supplement (ENSURE ENLIVE / ENSURE PLUS) LIQD Take 237 mLs by mouth 2 (two) times daily between meals. 10/21/21   Pokhrel, Laxman, MD  hydrALAZINE  (APRESOLINE ) 50 MG tablet Take 50 mg by mouth once.    [provider]  irbesartan  (AVAPRO ) 300 MG tablet Take 1 tablet (300 mg total) by mouth daily. 03/30/22   Elgergawy, Ardia Kraft, MD  lamoTRIgine  (LAMICTAL ) 100 MG tablet Take 1 tablet (100 mg total) by mouth 2 (two) times daily. 06/23/21   Phebe Brasil, MD  lamotrigine  (LAMICTAL ) 25 MG disintegrating tablet Take 50 mg by mouth at bedtime.    [provider]  LORazepam  (ATIVAN ) 0.5 MG tablet Take 0.5 mg by mouth in the morning and at bedtime. 01/31/24   [provider]  LORazepam  (ATIVAN ) 0.5 MG tablet Take 0.5 mg by mouth every 6 (six) hours as needed (agitation/restlessness).    [provider]      Allergies    Anesthetics, ester; Codeine; Tape; Hydrochlorothiazide; Hydrocodone; Norvasc  [amlodipine ];  Zyrtec [cetirizine hcl]; and Latex    Review of Systems   Review of Systems  Unable to perform ROS: Dementia    Physical Exam Updated Vital Signs BP (!) 161/76   Pulse 99   Temp 98.3 F (36.8 C) (Axillary)   Resp 14   SpO2 98%  Physical Exam Vitals and nursing note reviewed.  Constitutional:      Appearance: Normal appearance. She is well-developed.  HENT:     Head:     Comments: Tenderness scalp.     Nose: Nose normal.     Mouth/Throat:     Mouth: Mucous membranes are moist.  Eyes:     General: No scleral icterus.    Extraocular Movements: Extraocular movements intact.     Conjunctiva/sclera: Conjunctivae normal.     Pupils: Pupils are equal, round, and reactive to light.  Neck:     Vascular: No carotid bruit.     Trachea: No tracheal deviation.  Cardiovascular:     Rate and Rhythm: Normal rate and regular rhythm.     Pulses: Normal pulses.     Heart sounds: Normal heart sounds. No murmur heard.    No friction rub. No gallop.  Pulmonary:     Effort: Pulmonary effort is normal. No respiratory distress.     Breath sounds: Normal breath sounds.  Chest:     Chest wall: No tenderness.  Abdominal:  General: Bowel sounds are normal. There is no distension.     Palpations: Abdomen is soft.     Tenderness: There is abdominal tenderness. There is no guarding.     Comments: No abd bruising or contusion. Mid abd tenderness.   Genitourinary:    Comments: No cva tenderness.  Musculoskeletal:        General: No swelling or tenderness.     Cervical back: Normal range of motion and neck supple. No rigidity or tenderness. No muscular tenderness.     Comments: CTLS spine, non tender, aligned, no step off. Good rom bil extremities without pain or focal tenderness.   Skin:    General: Skin is warm and dry.     Findings: No rash.  Neurological:     Mental Status: She is alert.     Comments: Alert, speech normal. Motor/sens grossly intact bil.     ED Results / Procedures /  Treatments   Labs (all labs ordered are listed, but only abnormal results are displayed) Labs Reviewed - No data to display  EKG None  Radiology CT Head Wo Contrast Result Date: 03/14/2024 CLINICAL DATA:  Head trauma, neck trauma. Unwitnessed fall. Hematoma to back right of head. EXAM: CT HEAD WITHOUT CONTRAST CT CERVICAL SPINE WITHOUT CONTRAST TECHNIQUE: Multidetector CT imaging of the head and cervical spine was performed following the standard protocol without intravenous contrast. Multiplanar CT image reconstructions of the cervical spine were also generated. RADIATION DOSE REDUCTION: This exam was performed according to the departmental dose-optimization program which includes automated exposure control, adjustment of the mA and/or kV according to patient size and/or use of iterative reconstruction technique. COMPARISON:  02/02/2024. FINDINGS: CT HEAD FINDINGS Brain: No acute intracranial hemorrhage. No CT evidence of acute infarct. Nonspecific hypoattenuation in the periventricular and subcortical white matter favored to reflect chronic microvascular ischemic changes. Heterogeneous appearance of the basal ganglia and thalami which may reflect chronic microvascular changes versus small remote lacunar infarcts. No edema, mass effect, or midline shift. The basilar cisterns are patent. Ventricles: Prominence of the ventricles suggesting underlying parenchymal volume loss. Vascular: No hyperdense vessel or unexpected calcification. Skull: No acute or aggressive finding. Orbits: Orbits are symmetric. Sinuses: The visualized paranasal sinuses are clear. Other: Mastoid air cells are clear.  Right occipital scalp hematoma. CT CERVICAL SPINE FINDINGS Alignment: Cervical lordosis is maintained. No listhesis. No facet subluxation or dislocation. Skull base and vertebrae: No acute fracture. No primary bone lesion or focal pathologic process. Soft tissues and spinal canal: No prevertebral fluid or swelling. No  visible canal hematoma. Disc levels: Intervertebral disc spaces are relatively maintained. No high-grade osseous spinal canal stenosis. Facet arthrosis at multiple levels. No high-grade osseous foraminal stenosis. Upper chest: Negative. Other: None. IMPRESSION: No CT evidence of acute intracranial abnormality. No acute fracture or traumatic malalignment of the cervical spine. Right occipital scalp hematoma. Chronic microvascular ischemic changes. Electronically Signed   By: Denny Flack M.D.   On: 03/14/2024 20:15   CT CERVICAL SPINE WO CONTRAST Result Date: 03/14/2024 CLINICAL DATA:  Head trauma, neck trauma. Unwitnessed fall. Hematoma to back right of head. EXAM: CT HEAD WITHOUT CONTRAST CT CERVICAL SPINE WITHOUT CONTRAST TECHNIQUE: Multidetector CT imaging of the head and cervical spine was performed following the standard protocol without intravenous contrast. Multiplanar CT image reconstructions of the cervical spine were also generated. RADIATION DOSE REDUCTION: This exam was performed according to the departmental dose-optimization program which includes automated exposure control, adjustment of the mA and/or kV according to  patient size and/or use of iterative reconstruction technique. COMPARISON:  02/02/2024. FINDINGS: CT HEAD FINDINGS Brain: No acute intracranial hemorrhage. No CT evidence of acute infarct. Nonspecific hypoattenuation in the periventricular and subcortical white matter favored to reflect chronic microvascular ischemic changes. Heterogeneous appearance of the basal ganglia and thalami which may reflect chronic microvascular changes versus small remote lacunar infarcts. No edema, mass effect, or midline shift. The basilar cisterns are patent. Ventricles: Prominence of the ventricles suggesting underlying parenchymal volume loss. Vascular: No hyperdense vessel or unexpected calcification. Skull: No acute or aggressive finding. Orbits: Orbits are symmetric. Sinuses: The visualized paranasal  sinuses are clear. Other: Mastoid air cells are clear.  Right occipital scalp hematoma. CT CERVICAL SPINE FINDINGS Alignment: Cervical lordosis is maintained. No listhesis. No facet subluxation or dislocation. Skull base and vertebrae: No acute fracture. No primary bone lesion or focal pathologic process. Soft tissues and spinal canal: No prevertebral fluid or swelling. No visible canal hematoma. Disc levels: Intervertebral disc spaces are relatively maintained. No high-grade osseous spinal canal stenosis. Facet arthrosis at multiple levels. No high-grade osseous foraminal stenosis. Upper chest: Negative. Other: None. IMPRESSION: No CT evidence of acute intracranial abnormality. No acute fracture or traumatic malalignment of the cervical spine. Right occipital scalp hematoma. Chronic microvascular ischemic changes. Electronically Signed   By: Denny Flack M.D.   On: 03/14/2024 20:15    Procedures Procedures    Medications Ordered in ED Medications  acetaminophen  (TYLENOL ) tablet 650 mg (650 mg Oral Given 03/14/24 2111)  hydrALAZINE  (APRESOLINE ) tablet 50 mg (50 mg Oral Given 03/14/24 2111)    ED Course/ Medical Decision Making/ A&P                                 Medical Decision Making Problems Addressed: Accidental fall, initial encounter: acute illness or injury with systemic symptoms that poses a threat to life or bodily functions Contusion of head, initial encounter: acute illness or injury with systemic symptoms that poses a threat to life or bodily functions Dementia without behavioral disturbance (HCC): chronic illness or injury with exacerbation, progression, or side effects of treatment that poses a threat to life or bodily functions Elevated blood pressure reading: acute illness or injury Essential hypertension: chronic illness or injury with exacerbation, progression, or side effects of treatment that poses a threat to life or bodily functions  Amount and/or Complexity of Data  Reviewed Independent Historian: EMS    Details: hx External Data Reviewed: notes. Radiology: ordered and independent interpretation performed. Decision-making details documented in ED Course.  Risk OTC drugs. Prescription drug management. Decision regarding hospitalization.   Iv ns. Continuous pulse ox and cardiac monitoring. Imaging ordered.   Differential diagnosis includes sdh, fracture, contusion, etc. Dispo decision including potential need for admission considered - will get imaging and reassess.   Reviewed nursing notes and prior charts for additional history. External reports reviewed. Additional history from: EMS.   Cardiac monitor: sinus rhythm, rate 88.  CT reviewed/interpreted by me - no hem.   Acetaminophen  po. Po fluids.   Recheck pt, calm, no distress.   Hx htn, bp high in ED. Pt given dose of her pm meds.   Recheck bp improved. Pt alert, content appearing. No distress.   Pt currently appears stable for ED d/c.   Rec close pcp f/u.  Return precautions provided.          Final Clinical Impression(s) / ED Diagnoses Final diagnoses:  Accidental fall, initial encounter  Contusion of head, initial encounter  Elevated blood pressure reading  Essential hypertension  Dementia without behavioral disturbance [facility])    Rx / DC Orders ED Discharge Orders     None           Guadalupe Lee, MD 03/14/24 2113

## 2024-03-14 NOTE — ED Notes (Signed)
PTAR CALLED  °

## 2024-03-14 NOTE — ED Notes (Signed)
 Trauma Response Nurse Documentation   Sarah Cook is a 73 y.o. female arriving to [facility] ED via EMS  On Eliquis  (apixaban ) daily. Trauma was activated as a Level 2 by ED charge RN based on the following trauma criteria Elderly patients > 65 with head trauma on anti-coagulation (excluding ASA).  Patient cleared for CT by Dr. Moses Arenas EDP. Pt transported to CT with trauma response nurse present to monitor. RN remained with the patient throughout their absence from the department for clinical observation.   GCS 14.   History   Past Medical History:  Diagnosis Date   Allergy    Anxiety    Breast cancer (HCC)    Cataract    early signs of   Gait abnormality    Heart disease    Hemorrhoids    Hiatal hernia 11/07/2012   large   Hyperlipidemia    Hyperparathyroidism (HCC)    Hypertension    Memory loss    Osteoporosis    Parathyroid  disorder (HCC)    Pulmonary nodules    TIA (transient ischemic attack)    Urinary incontinence      Past Surgical History:  Procedure Laterality Date   BLADDER SUSPENSION     CESAREAN SECTION     x 2   CHOLECYSTECTOMY     MASTECTOMY Right    PARATHYROIDECTOMY     TOTAL ELBOW REPLACEMENT     TUBAL LIGATION         Initial Focused Assessment (If applicable, or please see trauma documentation): Confused female presents via EMS from SNF after an unwitnessed fall. Hematoma to R posterior occiptal, no lac noted. Pt stating "ouch" or crying when touched.  Airway patent, BS clear No obvious uncontrolled hemorrhage GCS 14 PERRLA 2  CT's Completed:   CT Head and CT C-Spine   Interventions:  Labs, XRAYS deferred by EDP CT head and cervical spine  Plan for disposition:  Discharge home   Consults completed:  none   Event Summary: Unwitnessed fall from SNF with head injury, pt on eliquis . Hx of dementia, unable to report events of this evening. Firm hematoma to the back of her head, no wounds noted. Trauma scans unremarkable, D/C back  to facility.  MTP Summary (If applicable): NA  Bedside handoff with ED RN Whitney.    Sarah Cook  Trauma Response RN  Please call TRN at [phone] for further assistance.

## 2024-03-14 NOTE — ED Notes (Signed)
 Pt able to tolerate fluids

## 2024-06-14 ENCOUNTER — Emergency Department (HOSPITAL_COMMUNITY)

## 2024-06-14 ENCOUNTER — Other Ambulatory Visit: Payer: Self-pay

## 2024-06-14 ENCOUNTER — Inpatient Hospital Stay (HOSPITAL_COMMUNITY)
Admission: EM | Admit: 2024-06-14 | Discharge: 2024-06-16 | DRG: 083 | Disposition: A | Discharge status: Home Health | Source: Skilled Nursing Facility | Attending: Student | Admitting: Student

## 2024-06-14 ENCOUNTER — Encounter (HOSPITAL_COMMUNITY): Payer: Self-pay

## 2024-06-14 DIAGNOSIS — Z885 Allergy status to narcotic agent status: Secondary | ICD-10-CM | POA: Diagnosis not present

## 2024-06-14 DIAGNOSIS — Z884 Allergy status to anesthetic agent status: Secondary | ICD-10-CM | POA: Diagnosis not present

## 2024-06-14 DIAGNOSIS — T148XXA Other injury of unspecified body region, initial encounter: Secondary | ICD-10-CM

## 2024-06-14 DIAGNOSIS — Z888 Allergy status to other drugs, medicaments and biological substances status: Secondary | ICD-10-CM

## 2024-06-14 DIAGNOSIS — W1830XA Fall on same level, unspecified, initial encounter: Secondary | ICD-10-CM | POA: Diagnosis present

## 2024-06-14 DIAGNOSIS — I110 Hypertensive heart disease with heart failure: Secondary | ICD-10-CM | POA: Diagnosis present

## 2024-06-14 DIAGNOSIS — F0390 Unspecified dementia without behavioral disturbance: Secondary | ICD-10-CM | POA: Diagnosis present

## 2024-06-14 DIAGNOSIS — E7800 Pure hypercholesterolemia, unspecified: Secondary | ICD-10-CM | POA: Diagnosis not present

## 2024-06-14 DIAGNOSIS — I609 Nontraumatic subarachnoid hemorrhage, unspecified: Secondary | ICD-10-CM

## 2024-06-14 DIAGNOSIS — Z86711 Personal history of pulmonary embolism: Secondary | ICD-10-CM

## 2024-06-14 DIAGNOSIS — E876 Hypokalemia: Secondary | ICD-10-CM | POA: Diagnosis present

## 2024-06-14 DIAGNOSIS — Z7901 Long term (current) use of anticoagulants: Secondary | ICD-10-CM

## 2024-06-14 DIAGNOSIS — Z9181 History of falling: Secondary | ICD-10-CM

## 2024-06-14 DIAGNOSIS — Z853 Personal history of malignant neoplasm of breast: Secondary | ICD-10-CM

## 2024-06-14 DIAGNOSIS — Z9011 Acquired absence of right breast and nipple: Secondary | ICD-10-CM

## 2024-06-14 DIAGNOSIS — Z803 Family history of malignant neoplasm of breast: Secondary | ICD-10-CM

## 2024-06-14 DIAGNOSIS — W19XXXA Unspecified fall, initial encounter: Principal | ICD-10-CM

## 2024-06-14 DIAGNOSIS — Z8249 Family history of ischemic heart disease and other diseases of the circulatory system: Secondary | ICD-10-CM | POA: Diagnosis not present

## 2024-06-14 DIAGNOSIS — Z66 Do not resuscitate: Secondary | ICD-10-CM | POA: Diagnosis present

## 2024-06-14 DIAGNOSIS — R410 Disorientation, unspecified: Secondary | ICD-10-CM | POA: Diagnosis present

## 2024-06-14 DIAGNOSIS — E210 Primary hyperparathyroidism: Secondary | ICD-10-CM | POA: Diagnosis present

## 2024-06-14 DIAGNOSIS — I5032 Chronic diastolic (congestive) heart failure: Secondary | ICD-10-CM | POA: Diagnosis present

## 2024-06-14 DIAGNOSIS — E785 Hyperlipidemia, unspecified: Secondary | ICD-10-CM | POA: Diagnosis present

## 2024-06-14 DIAGNOSIS — F03C0 Unspecified dementia, severe, without behavioral disturbance, psychotic disturbance, mood disturbance, and anxiety: Secondary | ICD-10-CM | POA: Diagnosis present

## 2024-06-14 DIAGNOSIS — S066XAA Traumatic subarachnoid hemorrhage with loss of consciousness status unknown, initial encounter: Secondary | ICD-10-CM | POA: Diagnosis present

## 2024-06-14 DIAGNOSIS — Z8673 Personal history of transient ischemic attack (TIA), and cerebral infarction without residual deficits: Secondary | ICD-10-CM

## 2024-06-14 DIAGNOSIS — I10 Essential (primary) hypertension: Secondary | ICD-10-CM | POA: Diagnosis present

## 2024-06-14 DIAGNOSIS — M810 Age-related osteoporosis without current pathological fracture: Secondary | ICD-10-CM | POA: Diagnosis present

## 2024-06-14 DIAGNOSIS — Z79899 Other long term (current) drug therapy: Secondary | ICD-10-CM

## 2024-06-14 DIAGNOSIS — Y92098 Other place in other non-institutional residence as the place of occurrence of the external cause: Secondary | ICD-10-CM

## 2024-06-14 LAB — BASIC METABOLIC PANEL WITH GFR
Anion gap: 10 (ref 5–15)
BUN: 19 mg/dL (ref 8–23)
CO2: 24 mmol/L (ref 22–32)
Calcium: 10.2 mg/dL (ref 8.9–10.3)
Chloride: 108 mmol/L (ref 98–111)
Creatinine, Ser: 0.84 mg/dL (ref 0.44–1.00)
GFR, Estimated: >60 mL/min (ref >60)
Glucose, Bld: 110 mg/dL — ABNORMAL HIGH (ref 70–99)
Potassium: 3.6 mmol/L (ref 3.5–5.1)
Sodium: 142 mmol/L (ref 135–145)

## 2024-06-14 LAB — CBC WITH DIFFERENTIAL/PLATELET
Abs Immature Granulocytes: 0.05 K/uL (ref 0.00–0.07)
Basophils Absolute: 0.1 K/uL (ref 0.0–0.1)
Basophils Relative: 1 %
Eosinophils Absolute: 0.1 K/uL (ref 0.0–0.5)
Eosinophils Relative: 1 %
HCT: 36 % (ref 36.0–46.0)
Hemoglobin: 12 g/dL (ref 12.0–15.0)
Immature Granulocytes: 1 %
Lymphocytes Relative: 8 %
Lymphs Abs: 0.8 K/uL (ref 0.7–4.0)
MCH: 31.3 pg (ref 26.0–34.0)
MCHC: 33.3 g/dL (ref 30.0–36.0)
MCV: 94 fL (ref 80.0–100.0)
Monocytes Absolute: 0.7 K/uL (ref 0.1–1.0)
Monocytes Relative: 6 %
Neutro Abs: 9 K/uL — ABNORMAL HIGH (ref 1.7–7.7)
Neutrophils Relative %: 83 %
Platelets: 314 K/uL (ref 150–400)
RBC: 3.83 MIL/uL — ABNORMAL LOW (ref 3.87–5.11)
RDW: 13.5 % (ref 11.5–15.5)
WBC: 10.7 K/uL — ABNORMAL HIGH (ref 4.0–10.5)
nRBC: 0 % (ref 0.0–0.2)

## 2024-06-14 LAB — APTT: aPTT: 28 s (ref 24–36)

## 2024-06-14 LAB — PROTIME-INR
INR: 1 (ref 0.8–1.2)
Prothrombin Time: 13.6 s (ref 11.4–15.2)

## 2024-06-14 MED ORDER — PROTHROMBIN COMPLEX CONC HUMAN 500 UNITS IV KIT
4184.0000 [IU] | PACK | Status: AC
  Administered 2024-06-14: 4184 [IU] via INTRAVENOUS
  Filled 2024-06-14: qty 1000

## 2024-06-14 NOTE — Progress Notes (Signed)
 73 yo F with dementia, on Eliquis  for remote hx of PE suffered a fall, was found to have small right convexity traumatic SAH.  Though her tSAH is small, I think reversing her anticoagulation may decrease the chance of long-term sequelae from this like chronic SDH.  As she is anticoagulated, I do recommend admission to a monitored bed and repeat CT head without contrast in the morning.  If stable or minimally changed, she may be able to be discharged shortly thereafter. As her ICH is quite small, I do not recommend prophylactic antiepileptics for 7 days as it would likely accentuate her underlying dementia, but they can be started if her CT tomorrow shows progression.

## 2024-06-14 NOTE — ED Notes (Signed)
 Phlebotomy at bedside.

## 2024-06-14 NOTE — H&P (Addendum)
 History and Physical    Patient: Sarah Cook FMW:[id number] DOB: 01/08/1951 DOA: 06/14/2024 DOS: the patient was seen and examined on 06/14/2024 PCP: Barbaraann Harvey, FNP  Patient coming from: Home  Chief Complaint:  Chief Complaint  Patient presents with   Fall   Head Injury   HPI: Sarah Cook is a 73 y.o. female with medical history significant of dementia, history of pulmonary embolism on Eliquis , hyperlipidemia, hyperparathyroidism, essential hypertension, history of TIA, history of urinary incontinence, history of breast cancer, who had a mechanical fall today and came to the ER.  Patient was seen and evaluated.  She is confused at the moment not able to give me any history.  Patient was however found to have small right convexity traumatic subarachnoid hemorrhage.  Neurosurgery consulted and Dr. Debby recommends admission to medical service, repeat head CT tomorrow and they will follow.  Patient has received Kcentra .  Review of Systems: As mentioned in the history of present illness. All other systems reviewed and are negative. Past Medical History:  Diagnosis Date   Allergy    Anxiety    Breast cancer (HCC)    Cataract    early signs of   Gait abnormality    Heart disease    Hemorrhoids    Hiatal hernia 11/07/2012   large   Hyperlipidemia    Hyperparathyroidism (HCC)    Hypertension    Memory loss    Osteoporosis    Parathyroid  disorder (HCC)    Pulmonary nodules    TIA (transient ischemic attack)    Urinary incontinence    Past Surgical History:  Procedure Laterality Date   BLADDER SUSPENSION     CESAREAN SECTION     x 2   CHOLECYSTECTOMY     MASTECTOMY Right    PARATHYROIDECTOMY     TOTAL ELBOW REPLACEMENT     TUBAL LIGATION     Social History:  reports that she has never smoked. She has never used smokeless tobacco. She reports that she does not drink alcohol and does not use drugs.  Allergies  Allergen Reactions   Anesthetics, Ester Nausea And  Vomiting    Not listed on MAR   Codeine Nausea Only    Not listed on MAR   Tape Other (See Comments)    Skin blisters Not listed on MAR   Hydrochlorothiazide Other (See Comments)    Unknown reaction Not listed on MAR   Hydrocodone Nausea And Vomiting    Not listed on MAR   Norvasc  [Amlodipine ] Swelling    Not listed on MAR   Zyrtec [Cetirizine Hcl]     AMS Not listed on MAR   Latex Itching and Rash    Not listed on MAR    Family History  Problem Relation Age of Onset   Breast cancer Mother    Sudden death Father    Heart failure Father    Hypertension Other    Heart disease Other    Colon cancer Neg Hx    Esophageal cancer Neg Hx    Stomach cancer Neg Hx    Rectal cancer Neg Hx     Prior to Admission medications   Medication Sig Start Date End Date Taking? Authorizing Provider  apixaban  (ELIQUIS ) 5 MG TABS tablet Take 1 tablet (5 mg total) by mouth 2 (two) times daily. 12/07/21   Gonfa, Taye T, MD  docusate sodium  (COLACE) 100 MG capsule Take 100 mg by mouth every evening.    [provider]  feeding supplement (ENSURE ENLIVE / ENSURE PLUS) LIQD Take 237 mLs by mouth 2 (two) times daily between meals. 10/21/21   Pokhrel, Laxman, MD  hydrALAZINE  (APRESOLINE ) 50 MG tablet Take 50 mg by mouth once.    [provider]  irbesartan  (AVAPRO ) 300 MG tablet Take 1 tablet (300 mg total) by mouth daily. 03/30/22   Elgergawy, Brayton RAMAN, MD  lamoTRIgine  (LAMICTAL ) 100 MG tablet Take 1 tablet (100 mg total) by mouth 2 (two) times daily. 06/23/21   Onita Duos, MD  lamotrigine  (LAMICTAL ) 25 MG disintegrating tablet Take 50 mg by mouth at bedtime.    [provider]  LORazepam  (ATIVAN ) 0.5 MG tablet Take 0.5 mg by mouth in the morning and at bedtime. 01/31/24   [provider]  LORazepam  (ATIVAN ) 0.5 MG tablet Take 0.5 mg by mouth every 6 (six) hours as needed (agitation/restlessness).    [provider]    Physical Exam: Vitals:   06/14/24 1800  06/14/24 1810 06/14/24 1815 06/14/24 1900  BP: (!) 163/94  (!) 179/85 (!) 163/91  Pulse: (!) 112 (!) 104 (!) 109 98  Resp: 17 19 13 13   Temp:      TempSrc:      SpO2: 95% 98% 98% 98%   Constitutional: Confused, NAD, calm, comfortable Eyes: PERRL, lids and conjunctivae normal ENMT: Mucous membranes are moist. Posterior pharynx clear of any exudate or lesions.Normal dentition.  Neck: normal, supple, no masses, no thyromegaly Respiratory: clear to auscultation bilaterally, no wheezing, no crackles. Normal respiratory effort. No accessory muscle use.  Cardiovascular: Sinus tachycardia, no murmurs / rubs / gallops. No extremity edema. 2+ pedal pulses. No carotid bruits.  Abdomen: no tenderness, no masses palpated. No hepatosplenomegaly. Bowel sounds positive.  Musculoskeletal: Good range of motion, no joint swelling or tenderness, Skin: no rashes, lesions, ulcers. No induration Neurologic: CN 2-12 grossly intact. Sensation intact, DTR normal. Strength 5/5 in all 4.  Psychiatric: Confused  Data Reviewed:  Temperature 98.5, blood pressure 170/85, pulse 112, respiratory of 19, white count 10.7, PT 30.6 INR 1.0, glucose 110 CT cervical spine showed no acute displaced fracture.  CT head without contrast showed at least moderate volume right sylvian fissure and temporal occipital subarachnoid hemorrhage chest x-ray no active disease x-ray of the hand showed no acute displaced fracture x-ray of the pelvis showed no acute fractures  Assessment and Plan:  #1 status post fall: Mechanical.  Patient has been unable to give adequate history.  She has been on blood thinner.  Will admit.  PT and OT consult.  Other supportive care.  Patient appears to be from the skilled facility and will probably be returned there.  #2 small subarachnoid hemorrhage: Traumatic in nature.  Neurosurgery consulted.  Repeat head CT in the morning.  If this is worse neurosurgery will intervene.  We are holding patient's  anticoagulation and has received Kcentra  for reversal of anticoagulation.  #3 history of pulmonary embolism: Hold on anticoagulation.  Continue to monitor   #4 essential hypertension: Continue blood pressure medications.  #5 dementia: Appears to be at baseline.  Continue to monitor  #6 hyperlipidemia: Continue with statin  #7 history of diastolic heart failure: Compensated.  Continue home regimen.    Advance Care Planning:   Code Status: Do not attempt resuscitation (DNR) PRE-ARREST INTERVENTIONS DESIRED   Consults: Dr. Debby, neurosurgery  Family Communication: No family at bedside  Severity of Illness: The appropriate patient status for this patient is INPATIENT. Inpatient status is judged to be reasonable  and necessary in order to provide the required intensity of service to ensure the patient's safety. The patient's presenting symptoms, physical exam findings, and initial radiographic and laboratory data in the context of their chronic comorbidities is felt to place them at high risk for further clinical deterioration. Furthermore, it is not anticipated that the patient will be medically stable for discharge from the hospital within 2 midnights of admission.   * I certify that at the point of admission it is my clinical judgment that the patient will require inpatient hospital care spanning beyond 2 midnights from the point of admission due to high intensity of service, high risk for further deterioration and high frequency of surveillance required.*  AuthorBETHA SIM KNOLL, MD 06/14/2024 7:39 PM  For on call review [email or web address].

## 2024-06-14 NOTE — ED Notes (Signed)
Phlebotomy to obtain labs. 

## 2024-06-14 NOTE — ED Notes (Signed)
 Hob elevated to 30 degrees

## 2024-06-14 NOTE — ED Provider Notes (Signed)
 [place] EMERGENCY DEPARTMENT AT [facility] Provider Note   CSN: [id number] Arrival date & time: 06/14/24  1659     Patient presents with: Fall and Head Injury   Sarah Cook is a 73 y.o. female.   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 Presumed fall. Unknown time of fall or time down. Please see EMS report for further details. Currently she is Aox1. Hematoma to right forehead only.   The history is provided by the patient. No language interpreter was used.  Fall  Head Injury      Prior to Admission medications   Medication Sig Start Date End Date Taking? Authorizing Provider  apixaban  (ELIQUIS ) 5 MG TABS tablet Take 1 tablet (5 mg total) by mouth 2 (two) times daily. 12/07/21   Gonfa, Taye T, MD  docusate sodium  (COLACE) 100 MG capsule Take 100 mg by mouth every evening.    [provider]  feeding supplement (ENSURE ENLIVE / ENSURE PLUS) LIQD Take 237 mLs by mouth 2 (two) times daily between meals. 10/21/21   Pokhrel, Laxman, MD  hydrALAZINE  (APRESOLINE ) 50 MG tablet Take 50 mg by mouth once.    [provider]  irbesartan  (AVAPRO ) 300 MG tablet Take 1 tablet (300 mg total) by mouth daily. 03/30/22   Elgergawy, Brayton RAMAN, MD  lamoTRIgine  (LAMICTAL ) 100 MG tablet Take 1 tablet (100 mg total) by mouth 2 (two) times daily. 06/23/21   Onita Duos, MD  lamotrigine  (LAMICTAL ) 25 MG disintegrating tablet Take 50 mg by mouth at bedtime.    [provider]  LORazepam  (ATIVAN ) 0.5 MG tablet Take 0.5 mg by mouth in the morning and at bedtime. 01/31/24   [provider]  LORazepam  (ATIVAN ) 0.5 MG tablet Take 0.5 mg by mouth every 6 (six) hours as needed (agitation/restlessness).    [provider]    Allergies: Anesthetics, ester; Codeine; Tape; Hydrochlorothiazide; Hydrocodone; Norvasc  [amlodipine ]; Zyrtec  [cetirizine hcl]; and Latex    Review of Systems  Unable to perform ROS: Dementia    Updated Vital Signs BP (!) 163/91   Pulse 98   Temp 98.4 F (36.9 C) (Oral)   Resp 13   SpO2 98%   Physical Exam Vitals and nursing note reviewed.  Constitutional:      General: She is not in acute distress.    Appearance: She is well-developed.  HENT:     Head: Normocephalic and atraumatic.  Eyes:     Conjunctiva/sclera: Conjunctivae normal.  Cardiovascular:     Rate and Rhythm: Normal rate and regular rhythm.     Heart sounds: No murmur heard. Pulmonary:     Effort: Pulmonary effort is normal. No respiratory distress.     Breath sounds: Normal breath sounds.  Abdominal:     Palpations: Abdomen is soft.     Tenderness: There is no abdominal tenderness.  Musculoskeletal:        General: No swelling.     Cervical back: Neck supple. No pain with movement.     Thoracic back: No bony tenderness.     Lumbar back: No bony tenderness.  Skin:    General: Skin is warm and dry.     Capillary Refill: Capillary refill takes less than 2 seconds.  Neurological:     Mental Status: She is alert.     Comments: Alert Oriented x 1-as per  baseline Full body tremors present Does not follow commands for ocular exam. PERRLA Moves all four extremities spontaneously.  Follows commands in upper extremities only. No motor dysfunction. Does not participate in lower extremity motor exam. Unable to participate in sensory exam.   Psychiatric:        Mood and Affect: Mood normal.     (all labs ordered are listed, but only abnormal results are displayed) Labs Reviewed  CBC WITH DIFFERENTIAL/PLATELET - Abnormal; Notable for the following components:      Result Value   WBC 10.7 (*)    RBC 3.83 (*)    Neutro Abs 9.0 (*)    All other components within normal limits  BASIC METABOLIC PANEL WITH GFR - Abnormal; Notable for the following components:   Glucose, Bld 110 (*)    All other components within  normal limits  PROTIME-INR  APTT    EKG: None  Radiology: DG Chest Portable 1 View Result Date: 06/14/2024 CLINICAL DATA:  fall EXAM: PORTABLE CHEST 1 VIEW COMPARISON:  Chest x-ray 03/24/2022 FINDINGS: The heart and mediastinal contours are unchanged. Mitral annular calcification. No focal consolidation. Chronic coarsened interstitial marking with no overt pulmonary edema. No pleural effusion. No pneumothorax. No acute osseous abnormality. IMPRESSION: No active disease. Electronically Signed   By: Morgane  Naveau M.D.   On: 06/14/2024 18:44   DG Hand Complete Right Result Date: 06/14/2024 CLINICAL DATA:  injury EXAM: RIGHT HAND - COMPLETE 3+ VIEW COMPARISON:  None Available. FINDINGS: Diffusely decreased bone density. There is no evidence of fracture or dislocation. There is no evidence of arthropathy or other focal bone abnormality. Soft tissues are unremarkable. IMPRESSION: No acute displaced fracture or dislocation. Electronically Signed   By: Morgane  Naveau M.D.   On: 06/14/2024 18:43   DG Pelvis Portable Result Date: 06/14/2024 CLINICAL DATA:  fall EXAM: PORTABLE PELVIS 1-2 VIEWS COMPARISON:  February 18, 2022 FINDINGS: No evidence of pelvic fracture or diastasis.No acute hip fracture or dislocation.There is no evidence of arthropathy or other focal bone abnormality.Soft tissues are unremarkable. IMPRESSION: No acute fracture, pelvic bone diastasis, or dislocation. Electronically Signed   By: Rogelia Myers M.D.   On: 06/14/2024 18:41   CT Head Wo Contrast Addendum Date: 06/14/2024 ADDENDUM REPORT: 06/14/2024 17:58 ADDENDUM: These results were called by telephone at the time of interpretation on 06/14/2024 at 5:57 pm to provider BERNARDA PEREYRA , who verbally acknowledged these results. Electronically Signed   By: Morgane  Naveau M.D.   On: 06/14/2024 17:58   Result Date: 06/14/2024 CLINICAL DATA:  Head trauma, minor (Age >= 65y); Neck trauma (Age >= 65y) .  Fall EXAM: CT HEAD WITHOUT CONTRAST CT  CERVICAL SPINE WITHOUT CONTRAST TECHNIQUE: Multidetector CT imaging of the head and cervical spine was performed following the standard protocol without intravenous contrast. Multiplanar CT image reconstructions of the cervical spine were also generated. RADIATION DOSE REDUCTION: This exam was performed according to the departmental dose-optimization program which includes automated exposure control, adjustment of the mA and/or kV according to patient size and/or use of iterative reconstruction technique. COMPARISON:  CT head and C-spine 03/14/2024 FINDINGS: CT HEAD FINDINGS Brain: Cerebral ventricle sizes are concordant with the degree of cerebral volume loss. Patchy and confluent areas of decreased attenuation are noted throughout the deep and periventricular white matter of the cerebral hemispheres bilaterally, compatible with chronic microvascular ischemic disease. No evidence of large-territorial acute infarction. No parenchymal hemorrhage. No mass At least moderate volume right sylvian fissure and temporal occipital subarachnoid hemorrhage (  6:14). No mass effect or midline shift. No hydrocephalus. Basilar cisterns are patent. Vascular: No hyperdense vessel. Skull: No acute fracture or focal lesion. Sinuses/Orbits: Paranasal sinuses and mastoid air cells are clear. The orbits are unremarkable. Other: Right frontal scalp hematoma measuring up to 4 mm. CT CERVICAL SPINE FINDINGS Alignment: Normal. Skull base and vertebrae: Multilevel mild facet arthropathy. No acute fracture. No aggressive appearing focal osseous lesion or focal pathologic process. Soft tissues and spinal canal: No prevertebral fluid or swelling. No visible canal hematoma. Upper chest: Unremarkable. Other: None. IMPRESSION: 1. At least moderate volume right sylvian fissure and temporal occipital subarachnoid hemorrhage. 2. No acute displaced fracture or traumatic listhesis of the cervical spine. Electronically Signed: By: Morgane  Naveau M.D. On:  06/14/2024 17:53   CT Cervical Spine Wo Contrast Addendum Date: 06/14/2024 ADDENDUM REPORT: 06/14/2024 17:58 ADDENDUM: These results were called by telephone at the time of interpretation on 06/14/2024 at 5:57 pm to provider BERNARDA PEREYRA , who verbally acknowledged these results. Electronically Signed   By: Morgane  Naveau M.D.   On: 06/14/2024 17:58   Result Date: 06/14/2024 CLINICAL DATA:  Head trauma, minor (Age >= 65y); Neck trauma (Age >= 65y) .  Fall EXAM: CT HEAD WITHOUT CONTRAST CT CERVICAL SPINE WITHOUT CONTRAST TECHNIQUE: Multidetector CT imaging of the head and cervical spine was performed following the standard protocol without intravenous contrast. Multiplanar CT image reconstructions of the cervical spine were also generated. RADIATION DOSE REDUCTION: This exam was performed according to the departmental dose-optimization program which includes automated exposure control, adjustment of the mA and/or kV according to patient size and/or use of iterative reconstruction technique. COMPARISON:  CT head and C-spine 03/14/2024 FINDINGS: CT HEAD FINDINGS Brain: Cerebral ventricle sizes are concordant with the degree of cerebral volume loss. Patchy and confluent areas of decreased attenuation are noted throughout the deep and periventricular white matter of the cerebral hemispheres bilaterally, compatible with chronic microvascular ischemic disease. No evidence of large-territorial acute infarction. No parenchymal hemorrhage. No mass At least moderate volume right sylvian fissure and temporal occipital subarachnoid hemorrhage (6:14). No mass effect or midline shift. No hydrocephalus. Basilar cisterns are patent. Vascular: No hyperdense vessel. Skull: No acute fracture or focal lesion. Sinuses/Orbits: Paranasal sinuses and mastoid air cells are clear. The orbits are unremarkable. Other: Right frontal scalp hematoma measuring up to 4 mm. CT CERVICAL SPINE FINDINGS Alignment: Normal. Skull base and vertebrae:  Multilevel mild facet arthropathy. No acute fracture. No aggressive appearing focal osseous lesion or focal pathologic process. Soft tissues and spinal canal: No prevertebral fluid or swelling. No visible canal hematoma. Upper chest: Unremarkable. Other: None. IMPRESSION: 1. At least moderate volume right sylvian fissure and temporal occipital subarachnoid hemorrhage. 2. No acute displaced fracture or traumatic listhesis of the cervical spine. Electronically Signed: By: Morgane  Naveau M.D. On: 06/14/2024 17:53     .Critical Care  Performed by: PEREYRA BERNARDA SQUIBB, DO Authorized by: PEREYRA BERNARDA SQUIBB, DO   Critical care provider statement:    Critical care time (minutes):  30   Critical care was necessary to treat or prevent imminent or life-threatening deterioration of the following conditions:  Trauma   Critical care was time spent personally by me on the following activities:  Development of treatment plan with patient or surrogate, discussions with consultants, evaluation of patient's response to treatment, examination of patient, ordering and review of laboratory studies, ordering and review of radiographic studies, ordering and performing treatments and interventions, pulse oximetry, re-evaluation of patient's condition and review  of old charts    Medications Ordered in the ED  prothrombin  complex conc human (KCENTRA ) IVPB 50 Units/kg (has no administration in time range)    Clinical Course as of 06/14/24 1934  Fri Jun 14, 2024  1756 Radiology call regarding [facility] results-acute SAH right sylvian fissure. Page to neurosurg placed. Pt remains hemodynamically stable. [AG]  1823 Neurosurgery call back-recommended Kcentra  and repeat CTH in the morning.  [AG]    Clinical Course User Index [AG] Elnor Bernarda SQUIBB, DO                                 Medical Decision Making Amount and/or Complexity of Data Reviewed Labs: ordered. Radiology: ordered.  Risk Decision regarding hospitalization.   73  yo presenting for presumed fall on Eliquis . Pt is Aox1, no acute distress, afebrile, with stable vitals. Hematoma present right forehead. No facial bony tenderness. Difficulty with neurological exam due to difficulty following commands. Please see physical for details. No spinal pain. No other bony pain or evidence of fractures on body exam.  CTH pending Cervical spine CT pending. Neck collar on.   Pt offered and declined pain medication at this time.   7:34 PM Call from radiology-Mod vol right sylvian fissure and temporal occipital lobe SAH. Neurosurgery paged.  Patient's husband Lyanne Kates made aware.   Chest x-ray, pelvis, and right hand negative for acute fractures.  I spoke with neurosurgery team who recommends progressive bed, Kcentra , and repeat CT head in the morning.  Kcentra  ordered.  Consult placed to admission team.  Pt accepted by admitting team.      Final diagnoses:  Fall, initial encounter  Hematoma  SAH (subarachnoid hemorrhage) [facility])    ED Discharge Orders     None          Elnor Bernarda SQUIBB, DO 06/14/24 1934

## 2024-06-14 NOTE — ED Notes (Signed)
C-Collar removed by provider.

## 2024-06-14 NOTE — ED Triage Notes (Addendum)
 PT arrives via EMS from [facility]. EMS states staff noticed that the patient had a hematoma to the right side of her forehead today. Unsure how or when she injured her head. Pt has hx of dementia. She is oriented to her name and dob only, which is her baseline. Pt is on eliquis . Pupils are PERRL. EMS placed a c-collar on patient. Pt does have to be reminded not to take it off. Old bruising noted to right hand. Cms intact

## 2024-06-14 NOTE — ED Notes (Signed)
 CT contacted by this RN to expedite pt getting to CT. Staff reports they will do her ct at this time.

## 2024-06-15 ENCOUNTER — Inpatient Hospital Stay (HOSPITAL_COMMUNITY)

## 2024-06-15 DIAGNOSIS — E7800 Pure hypercholesterolemia, unspecified: Secondary | ICD-10-CM

## 2024-06-15 DIAGNOSIS — I2510 Atherosclerotic heart disease of native coronary artery without angina pectoris: Secondary | ICD-10-CM | POA: Insufficient documentation

## 2024-06-15 DIAGNOSIS — I609 Nontraumatic subarachnoid hemorrhage, unspecified: Secondary | ICD-10-CM | POA: Diagnosis not present

## 2024-06-15 DIAGNOSIS — Z86711 Personal history of pulmonary embolism: Secondary | ICD-10-CM

## 2024-06-15 DIAGNOSIS — I10 Essential (primary) hypertension: Secondary | ICD-10-CM | POA: Insufficient documentation

## 2024-06-15 DIAGNOSIS — E210 Primary hyperparathyroidism: Secondary | ICD-10-CM | POA: Insufficient documentation

## 2024-06-15 DIAGNOSIS — F03C0 Unspecified dementia, severe, without behavioral disturbance, psychotic disturbance, mood disturbance, and anxiety: Secondary | ICD-10-CM

## 2024-06-15 LAB — COMPREHENSIVE METABOLIC PANEL WITH GFR
ALT: 13 U/L (ref 0–44)
AST: 17 U/L (ref 15–41)
Albumin: 3.2 g/dL — ABNORMAL LOW (ref 3.5–5.0)
Alkaline Phosphatase: 61 U/L (ref 38–126)
Anion gap: 8 (ref 5–15)
BUN: 17 mg/dL (ref 8–23)
CO2: 23 mmol/L (ref 22–32)
Calcium: 9.8 mg/dL (ref 8.9–10.3)
Chloride: 110 mmol/L (ref 98–111)
Creatinine, Ser: 0.77 mg/dL (ref 0.44–1.00)
GFR, Estimated: >60 mL/min (ref >60)
Glucose, Bld: 120 mg/dL — ABNORMAL HIGH (ref 70–99)
Potassium: 3.4 mmol/L — ABNORMAL LOW (ref 3.5–5.1)
Sodium: 141 mmol/L (ref 135–145)
Total Bilirubin: 1.2 mg/dL (ref 0.0–1.2)
Total Protein: 5.7 g/dL — ABNORMAL LOW (ref 6.5–8.1)

## 2024-06-15 LAB — CBC
HCT: 32 % — ABNORMAL LOW (ref 36.0–46.0)
Hemoglobin: 11 g/dL — ABNORMAL LOW (ref 12.0–15.0)
MCH: 31.4 pg (ref 26.0–34.0)
MCHC: 34.4 g/dL (ref 30.0–36.0)
MCV: 91.4 fL (ref 80.0–100.0)
Platelets: 255 K/uL (ref 150–400)
RBC: 3.5 MIL/uL — ABNORMAL LOW (ref 3.87–5.11)
RDW: 13.2 % (ref 11.5–15.5)
WBC: 6.6 K/uL (ref 4.0–10.5)
nRBC: 0 % (ref 0.0–0.2)

## 2024-06-15 LAB — MAGNESIUM: Magnesium: 1.9 mg/dL (ref 1.7–2.4)

## 2024-06-15 MED ORDER — POTASSIUM CHLORIDE CRYS ER 20 MEQ PO TBCR
60.0000 meq | EXTENDED_RELEASE_TABLET | Freq: Once | ORAL | Status: DC
  Filled 2024-06-15: qty 3

## 2024-06-15 MED ORDER — DEXTROSE IN LACTATED RINGERS 5 % IV SOLN: INTRAVENOUS | Status: DC

## 2024-06-15 MED ORDER — ONDANSETRON HCL 4 MG PO TABS: 4.0000 mg | ORAL_TABLET | Freq: Four times a day (QID) | ORAL | Status: DC | PRN

## 2024-06-15 MED ORDER — IRBESARTAN 300 MG PO TABS
300.0000 mg | ORAL_TABLET | Freq: Every day | ORAL | Status: DC
  Administered 2024-06-15 – 2024-06-16 (×2): 300 mg via ORAL
  Filled 2024-06-15 (×2): qty 1

## 2024-06-15 MED ORDER — ONDANSETRON HCL 4 MG/2ML IJ SOLN: 4.0000 mg | Freq: Four times a day (QID) | INTRAMUSCULAR | Status: DC | PRN

## 2024-06-15 MED ORDER — LAMOTRIGINE 100 MG PO TABS
100.0000 mg | ORAL_TABLET | Freq: Two times a day (BID) | ORAL | Status: DC
  Administered 2024-06-15 – 2024-06-16 (×3): 100 mg via ORAL
  Filled 2024-06-15 (×3): qty 1

## 2024-06-15 MED ORDER — LORAZEPAM 0.5 MG PO TABS: 0.5000 mg | ORAL_TABLET | Freq: Four times a day (QID) | ORAL | Status: DC | PRN

## 2024-06-15 MED ORDER — POTASSIUM CHLORIDE 2 MEQ/ML IV SOLN
INTRAVENOUS | Status: DC
  Filled 2024-06-15 (×2): qty 1000

## 2024-06-15 MED ORDER — ACETAMINOPHEN 650 MG RE SUPP: 650.0000 mg | Freq: Four times a day (QID) | RECTAL | Status: DC | PRN

## 2024-06-15 MED ORDER — ACETAMINOPHEN 325 MG PO TABS: 650.0000 mg | ORAL_TABLET | Freq: Four times a day (QID) | ORAL | Status: DC | PRN

## 2024-06-15 NOTE — Plan of Care (Signed)
  Problem: Health Behavior/Discharge Planning: Goal: Ability to manage health-related needs will improve Outcome: Progressing   Problem: Clinical Measurements: Goal: Will remain free from infection Outcome: Progressing   Problem: Activity: Goal: Risk for activity intolerance will decrease Outcome: Progressing   Problem: Coping: Goal: Level of anxiety will decrease Outcome: Progressing   Problem: Elimination: Goal: Will not experience complications related to urinary retention Outcome: Progressing   Problem: Safety: Goal: Ability to remain free from injury will improve Outcome: Progressing   Problem: Elimination: Goal: Will not experience complications related to urinary retention Outcome: Progressing

## 2024-06-15 NOTE — Evaluation (Signed)
 Physical Therapy Evaluation Patient Details Name: Sarah Cook MRN: [id number] DOB: January 02, 1951 Today's Date: 06/15/2024  History of Present Illness  Pt is 73 yo presenting to [facility] from [facility]8 due to hematoma on the R side of forehead. Found R parietal, R tempoparietal and sylvian fissure SAH on CT per MD note. PMH: dementia, PE, hyperparathyroidism, HTN, HLD, urinary incontinence, TIA  Clinical Impression  Pt is presenting below baseline level of functioning from previous hospitalizations reported in therapist notes. Pt currently is Mod to Max A for bed mobility and Max A for sit to stand at EOB. Pt was limited by pain in the R shoulder/scapular area and nursing was notified. Currently pt is lifting at ALF. Due to pt current functional status, home set up and available assistance at home recommending skilled physical therapy services 3x/week in order to address strength, balance and functional mobility to decrease risk for falls, injury and re-hospitalization.           If plan is discharge home, recommend the following: Two people to help with walking and/or transfers;Assistance with cooking/housework;Assist for transportation;Supervision due to cognitive status     Equipment Recommendations None recommended by PT     Functional Status Assessment Patient has had a recent decline in their functional status and demonstrates the ability to make significant improvements in function in a reasonable and predictable amount of time.     Precautions / Restrictions Precautions Precautions: Fall Recall of Precautions/Restrictions: Impaired Restrictions Weight Bearing Restrictions Per Provider Order: No      Mobility  Bed Mobility Overal bed mobility: Needs Assistance Bed Mobility: Supine to Sit, Sit to Supine     Supine to sit: Max assist Sit to supine: Mod assist   General bed mobility comments: Max A, Assisted getting LE to EOB and pushing up to mid line after movement was  initiated physically by therapist. Pt was able to lower trunk to bed and assist raising legs once movement was initiated by physical therapist.    Transfers Overall transfer level: Needs assistance Equipment used: None Transfers: Sit to/from Stand Sit to Stand: Max assist           General transfer comment: Max A from EOB with RW with poor hand placement despite max cues; pt with heavy posterior lean.    Ambulation/Gait     General Gait Details: unable at this time.    Balance Overall balance assessment: Needs assistance Sitting-balance support: Bilateral upper extremity supported, Feet supported Sitting balance-Leahy Scale: Fair Sitting balance - Comments: supervision   Standing balance support: Bilateral upper extremity supported, Reliant on assistive device for balance Standing balance-Leahy Scale: Zero Standing balance comment: reliant on heavy external support.       Pertinent Vitals/Pain Pain Assessment Pain Assessment: Faces Faces Pain Scale: Hurts whole lot Breathing: occasional labored breathing, short period of hyperventilation Negative Vocalization: repeated troubled calling out, loud moaning/groaning, crying Facial Expression: sad, frightened, frown Body Language: tense, distressed pacing, fidgeting Consolability: distracted or reassured by voice/touch PAINAD Score: 6 Pain Location: R shoulder/scapula Pain Descriptors / Indicators: Crying, Discomfort, Grimacing, Guarding Pain Intervention(s): Monitored during session, Limited activity within patient's tolerance, Other (comment) (RN notified)    Home Living Family/patient expects to be discharged to:: Assisted living       Home Equipment: Agricultural consultant (2 wheels);Cane - single point;Shower seat;Wheelchair - manual Additional Comments: info was retrieved from previous admissions as pt is a poor historian.    Prior Function Prior Level of Function :  Needs assist  Cognitive Assist : Mobility  (cognitive)           Mobility Comments: ambulatory with RW ADLs Comments: Assist required with caregiver assistance for all ADL's and ambulation     Extremity/Trunk Assessment   Upper Extremity Assessment Upper Extremity Assessment: Generalized weakness    Lower Extremity Assessment Lower Extremity Assessment: Generalized weakness    Cervical / Trunk Assessment Cervical / Trunk Assessment: Normal  Communication   Communication Communication: No apparent difficulties    Cognition Arousal: Alert Behavior During Therapy: Anxious, Flat affect   PT - Cognitive impairments: History of cognitive impairments     Following commands: Impaired Following commands impaired: Follows one step commands inconsistently, Follows one step commands with increased time     Cueing Cueing Techniques: Verbal cues, Gestural cues, Tactile cues, Visual cues     General Comments General comments (skin integrity, edema, etc.): No noted skin issues. Pt was assisted with changing linens due to urine saturation.     Assessment/Plan    PT Assessment Patient needs continued PT services  PT Problem List Decreased strength;Decreased activity tolerance;Decreased balance;Decreased mobility       PT Treatment Interventions DME instruction;Gait training;Functional mobility training;Therapeutic activities;Therapeutic exercise;Balance training;Patient/family education    PT Goals (Current goals can be found in the Care Plan section)  Acute Rehab PT Goals PT Goal Formulation: Patient unable to participate in goal setting Time For Goal Achievement: 06/29/24 Potential to Achieve Goals: Fair    Frequency Min 1X/week        AM-PAC PT 6 Clicks Mobility  Outcome Measure Help needed turning from your back to your side while in a flat bed without using bedrails?: A Lot Help needed moving from lying on your back to sitting on the side of a flat bed without using bedrails?: A Lot Help needed moving  to and from a bed to a chair (including a wheelchair)?: Total Help needed standing up from a chair using your arms (e.g., wheelchair or bedside chair)?: Total Help needed to walk in hospital room?: Total Help needed climbing 3-5 steps with a railing? : Total 6 Click Score: 8    End of Session Equipment Utilized During Treatment: Gait belt Activity Tolerance: Patient tolerated treatment well;Patient limited by pain;Other (comment) (limited by mentation) Patient left: in bed;with call bell/phone within reach Nurse Communication: Mobility status PT Visit Diagnosis: Unsteadiness on feet (R26.81);Other abnormalities of gait and mobility (R26.89);Muscle weakness (generalized) (M62.81);History of falling (Z91.81)    Time: 8660-8595 PT Time Calculation (min) (ACUTE ONLY): 25 min   Charges:   PT Evaluation $PT Eval Low Complexity: 1 Low PT Treatments $Therapeutic Activity: 8-22 mins PT General Charges $$ ACUTE PT VISIT: 1 Visit         Dorothyann Maier, DPT, CLT  Acute Rehabilitation Services Office: [phone] (Secure chat preferred)   Dorothyann VEAR Maier 06/15/2024, 2:22 PM

## 2024-06-15 NOTE — Progress Notes (Signed)
 Pt is alert but disoriented X4, did swallow test and failed, informed MD Garba. Also re-informed MD Von and is aware too that pt has an oral meds to start.

## 2024-06-15 NOTE — Consult Note (Signed)
   Providing Compassionate, Quality Care - Together  Neurosurgery Consult  Referring physician: EDP Reason for referral: SAH  History of Present Illness: 73 yo F with dementia, on Eliquis  for remote hx of PE suffered a fall, was found to have small right convexity traumatic SAH.    Physical Exam:  BP (!) 147/72 (BP Location: Right Arm)   Pulse 86   Temp 98 F (36.7 C) (Oral)   Resp 18   SpO2 97%    PE: Awake, alert, oriented to person Speech fluent CN grossly intact 5/5 BUE/BLE   Impression/Assessment:  This is a 73yo female with R traumatic SAH after GLF on Eliquis , stable on repeat CTH. She is s/p kcentra .   Plan:  -Appropriate for dc from NSGY standpoint.  -Hold Eliquis  7-10d, consider cessation give high risk of falls and subsequent ICH.   Altie Savard CAYLIN Dayten Juba, PA-C

## 2024-06-15 NOTE — Progress Notes (Signed)
 Received pt from ED via stretcher, alert and confused. Hooked to tele monitoring, placed call bell within reach.

## 2024-06-15 NOTE — Progress Notes (Deleted)
 Repeat CTH reviewed. There is no significant change in R SAH. She is appropriate for dc from NSGY standpoint. She should hold her Eliquis  for 7-10d with consideration for cessation altogether to be discussed with her primary provider given high risk of falls and subsequent ICH. Call w/ questions/concerns.   Ichiro Chesnut CAYLIN Walker [place], PA-C

## 2024-06-15 NOTE — Plan of Care (Signed)
   Problem: Clinical Measurements: Goal: Will remain free from infection Outcome: Progressing Goal: Diagnostic test results will improve Outcome: Progressing   Problem: Nutrition: Goal: Adequate nutrition will be maintained Outcome: Progressing

## 2024-06-15 NOTE — ED Notes (Signed)
 This nurse called 3W charge nurse to notify that patient is being transported to room.

## 2024-06-15 NOTE — Evaluation (Signed)
 Clinical/Bedside Swallow Evaluation Patient Details  Name: Sarah Cook MRN: [id number] Date of Birth: Feb 06, 1951  Today's Date: 06/15/2024 Time: SLP Start Time (ACUTE ONLY): 0957 SLP Stop Time (ACUTE ONLY): 1010 SLP Time Calculation (min) (ACUTE ONLY): 13 min  Past Medical History:  Past Medical History:  Diagnosis Date   Allergy    Anxiety    Breast cancer (HCC)    Cataract    early signs of   Gait abnormality    Heart disease    Hemorrhoids    Hiatal hernia 11/07/2012   large   Hyperlipidemia    Hyperparathyroidism (HCC)    Hypertension    Memory loss    Osteoporosis    Parathyroid  disorder (HCC)    Pulmonary nodules    TIA (transient ischemic attack)    Urinary incontinence    Past Surgical History:  Past Surgical History:  Procedure Laterality Date   BLADDER SUSPENSION     CESAREAN SECTION     x 2   CHOLECYSTECTOMY     MASTECTOMY Right    PARATHYROIDECTOMY     TOTAL ELBOW REPLACEMENT     TUBAL LIGATION     HPI:  Patient is a 73 y.o. 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 She presented to the hospital on 06/14/2024 following a mechanical fall. CT head on 8/8 showed oderate volume right sylvian fissure and temporal occipital subarachnoid hemorrhage. Repeat CT head pending. She failed [facility] swallow screen with nursing and SLP swallow evaluation ordered.    Assessment / Plan / Recommendation  Clinical Impression  Patient presents with clinical s/s of what appears to be a cognitive-based dysphagia. Mastication of solids was mildly prolonged but complete and no oral residuals observed s/s swallows. She consumed thin liquids (water) via straw sips with no overt s/s aspiration and only questionable mild swallow initiation delay. SLP recommending to initiate PO diet of Dys 3 (mechancial soft) solids, thin liquids and will follow briefly for toleration and ability to advance. SLP Visit  Diagnosis: Dysphagia, unspecified (R13.10)    Aspiration Risk  Mild aspiration risk    Diet Recommendation Alternative means - long-term;Thin liquid    Liquid Administration via: Cup;Straw Medication Administration: Whole meds with liquid Supervision: Patient able to self feed;Intermittent supervision to cue for compensatory strategies;Comment (she may need full assist if unable to adequately feed self) Compensations: Slow rate;Small sips/bites Postural Changes: Seated upright at 90 degrees    Other  Recommendations Oral Care Recommendations: Oral care BID     Assistance Recommended at Discharge    Functional Status Assessment Patient has had a recent decline in their functional status and demonstrates the ability to make significant improvements in function in a reasonable and predictable amount of time.  Frequency and Duration min 1 x/week  1 week       Prognosis Prognosis for improved oropharyngeal function: Good Barriers to Reach Goals: Cognitive deficits      Swallow Study   General Date of Onset: 06/14/24 HPI: Patient is a 73 y.o. 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 She presented to the hospital on 06/14/2024 following a mechanical fall. CT head on 8/8 showed oderate volume right sylvian fissure and temporal occipital subarachnoid hemorrhage. Repeat CT head pending. She failed [facility] swallow screen with nursing and SLP swallow evaluation ordered. Type of Study: Bedside Swallow Evaluation Previous Swallow Assessment: remote,  BSE during 2018 admission Diet Prior to this Study: NPO Temperature Spikes Noted: No Respiratory Status: Room air History of Recent Intubation: No Behavior/Cognition: Alert;Cooperative;Pleasant mood;Confused;Requires cueing Oral Cavity Assessment: Within Functional Limits Oral Care Completed by SLP: No Oral Cavity - Dentition: Adequate natural dentition Vision:  Functional for self-feeding Self-Feeding Abilities: Needs assist;Needs set up;Able to feed self Patient Positioning: Upright in bed Baseline Vocal Quality: Normal Volitional Cough: Cognitively unable to elicit Volitional Swallow: Unable to elicit    Oral/Motor/Sensory Function Overall Oral Motor/Sensory Function: Within functional limits   Ice Chips     Thin Liquid Thin Liquid: Within functional limits Presentation: Straw;Self Fed    Nectar Thick     Honey Thick     Puree Puree: Within functional limits Presentation: Spoon   Solid     Solid: Impaired Presentation: Self Fed Oral Phase Impairments: Impaired mastication     Norleen IVAR Blase, MA, CCC-SLP Speech Therapy

## 2024-06-15 NOTE — Progress Notes (Signed)
 PROGRESS NOTE  Sarah Cook FMW:[id number] DOB: [birth year]   PCP: Barbaraann Harvey, FNP  Patient is from: [facility]  DOA: 06/14/2024 LOS: 1  Chief complaints Chief Complaint  Patient presents with   Fall   Head Injury     Brief Narrative / Interim history: 73 year old F with PMH of severe dementia, PE on Eliquis , primary hyperparathyroidism, HTN, HLD, urinary incontinence and TIA brought to ED by EMS after staff at [facility] noticed hematoma to the right side of her forehead.  Unclear how she injured her head.  In the ED, slightly tachycardic and hypertensive.  Basic labs without significant finding.  INR normal.  CT head showed at least moderate volume right sylvian fissure and temporal occipital subarachnoid hemorrhage.  CT cervical spine, chest, right hand and pelvic x-ray without significant finding.  Eliquis  reversed by Kcentra .  Neurosurgery, Dr. Debby consulted and recommended admission to to medicine service, holding anticoagulation and repeat CT the morning.   Repeat CT the next day showed stable right parietal, right temporoparietal, and sylvian fissure subarachnoid hemorrhage.    Subjective: Seen and examined earlier this morning.  No major events overnight or this morning.  Patient is awake and alert but only oriented to self.  She follows some commands.  Does not appear to be in distress.  Per RN, not able to take p.o. mainly due to cognitive deficit.  Objective: Vitals:   06/15/24 0040 06/15/24 0104 06/15/24 0426 06/15/24 0731  BP: (!) 150/85  (!) 153/76 (!) 147/72  Pulse: 97  84 86  Resp: 18  18 18   Temp: 99.3 F (37.4 C)  99.2 F (37.3 C) 98 F (36.7 C)  TempSrc: Oral  Oral Oral  SpO2: 97% 97% 96% 97%    Examination:  GENERAL: No apparent distress.  Nontoxic. HEENT: MMM.  Vision and hearing grossly intact.  NECK: Supple.  No apparent JVD.  RESP:  No IWOB.  Fair aeration bilaterally. CVS:  RRR. Heart sounds normal.  ABD/GI/GU: BS+. Abd soft,  NTND.  MSK/EXT:  Moves extremities. No apparent deformity. No edema.  SKIN: no apparent skin lesion or wound NEURO: AA.  Oriented to her name.  Follows some commands.  No apparent focal neuro deficit. PSYCH: Calm. Normal affect.   Consultants:  Neurosurgery  Procedures: None none  Microbiology summarized: None  Assessment and plan: Presumed fall at facility Right temporoparietal SAH likely due to fall on Eliquis : Patient with history of PE.  On Eliquis  for anticoagulation.  Eliquis  reversed by Kcentra  in ED.  Repeat CT head with stable SAH. -Appreciate help by neurosurgery -Continue holding anticoagulation until outpatient follow-up with neurosurgery, Dr. Debby -Fall precaution -PT/OT/SLP   History of subsegmental PE: Seems to be remote -Will discontinue Eliquis  indefinitely given SAH and remote PE   Essential hypertension: BP within acceptable range. -Resume home Avapro  - Hold home hydralazine    Dementia without behavioral disturbance: Seems to be severe.  Awake and alert but only oriented to self.  Inconsistently follow commands. -Reorientation and delirium precaution -Continue home Lamictal  -Hold Ativan .  Hyperlipidemia: Continue with statin  Hypokalemia -Monitor replenish K and Mg as appropriate  There is no height or weight on file to calculate BMI.           DVT prophylaxis:  SCDs Start: 06/15/24 0104  Code Status: DNR Family Communication: None at bedside Level of care: Telemetry Medical Status is: Inpatient Remains inpatient appropriate because: Subarachnoid hemorrhage   Final disposition: Back to facility once medically clear  55 minutes with more than 50% spent in reviewing records, counseling patient/family and coordinating care.   Sch Meds:  Scheduled Meds:  irbesartan   300 mg Oral Daily   lamoTRIgine   100 mg Oral BID   Continuous Infusions:  dextrose  5% lactated ringers  1,000 mL with potassium chloride  40 mEq infusion 50 mL/hr at  06/15/24 1137   PRN Meds:.acetaminophen  **OR** acetaminophen , LORazepam , ondansetron  **OR** ondansetron  (ZOFRAN ) IV  Antimicrobials: Anti-infectives (From admission, onward)    None        I have personally reviewed the following labs and images: CBC: Recent Labs  Lab 06/14/24 1749 06/15/24 0250  WBC 10.7* 6.6  NEUTROABS 9.0*  --   HGB 12.0 11.0*  HCT 36.0 32.0*  MCV 94.0 91.4  PLT 314 255   BMP &GFR Recent Labs  Lab 06/14/24 1749 06/15/24 0250 06/15/24 0814  NA 142 141  --   K 3.6 3.4*  --   CL 108 110  --   CO2 24 23  --   GLUCOSE 110* 120*  --   BUN 19 17  --   CREATININE 0.84 0.77  --   CALCIUM  10.2 9.8  --   MG  --   --  1.9   CrCl cannot be calculated (Unknown ideal weight.). Liver & Pancreas: Recent Labs  Lab 06/15/24 0250  AST 17  ALT 13  ALKPHOS 61  BILITOT 1.2  PROT 5.7*  ALBUMIN 3.2*   No results for input(s): LIPASE, AMYLASE in the last 168 hours. No results for input(s): AMMONIA in the last 168 hours. Diabetic: No results for input(s): HGBA1C in the last 72 hours. No results for input(s): GLUCAP in the last 168 hours. Cardiac Enzymes: No results for input(s): CKTOTAL, CKMB, CKMBINDEX, TROPONINI in the last 168 hours. No results for input(s): PROBNP in the last 8760 hours. Coagulation Profile: Recent Labs  Lab 06/14/24 1749  INR 1.0   Thyroid  Function Tests: No results for input(s): TSH, T4TOTAL, FREET4, T3FREE, THYROIDAB in the last 72 hours. Lipid Profile: No results for input(s): CHOL, HDL, LDLCALC, TRIG, CHOLHDL, LDLDIRECT in the last 72 hours. Anemia Panel: No results for input(s): VITAMINB12, FOLATE, FERRITIN, TIBC, IRON , RETICCTPCT in the last 72 hours. Urine analysis:    Component Value Date/Time   COLORURINE STRAW (A) 03/24/2022 2022   APPEARANCEUR CLEAR 03/24/2022 2022   LABSPEC 1.006 03/24/2022 2022   PHURINE 5.0 03/24/2022 2022   GLUCOSEU NEGATIVE 03/24/2022  2022   HGBUR NEGATIVE 03/24/2022 2022   BILIRUBINUR NEGATIVE 03/24/2022 2022   KETONESUR NEGATIVE 03/24/2022 2022   PROTEINUR NEGATIVE 03/24/2022 2022   UROBILINOGEN 0.2 02/20/2009 1445   NITRITE NEGATIVE 03/24/2022 2022   LEUKOCYTESUR SMALL (A) 03/24/2022 2022   Sepsis Labs: Invalid input(s): PROCALCITONIN, LACTICIDVEN  Microbiology: No results found for this or any previous visit (from the past 240 hours).  Radiology Studies: CT HEAD WO CONTRAST ( ) Result Date: 06/15/2024 EXAM: CT HEAD WITHOUT CONTRAST 06/15/2024 10:49:43 AM TECHNIQUE: CT of the head was performed without the administration of intravenous contrast. Automated exposure control, iterative reconstruction, and/or weight based adjustment of the mA/kV was utilized to reduce the radiation dose to as low as reasonably achievable. COMPARISON: CT head without contrast 06/14/2024. CLINICAL HISTORY: Subarachnoid hemorrhage [facility]). FINDINGS: BRAIN AND VENTRICLES: Right parietal, right temporoparietal, and sylvian fissure subarachnoid hemorrhage is stable. Advanced atrophy and white matter disease is again noted. No hydrocephalus. No extra-axial collection. No mass effect or midline shift. ORBITS: No acute abnormality. SINUSES: No acute abnormality. SOFT  TISSUES AND SKULL: No acute soft tissue abnormality. No skull fracture. IMPRESSION: 1. Stable right parietal, right temporoparietal, and sylvian fissure subarachnoid hemorrhage. 2. Advanced atrophy and white matter disease. Electronically signed by: Lonni Necessary MD 06/15/2024 11:02 AM EDT RP Workstation: [id number]   DG Chest Portable 1 View Result Date: 06/14/2024 CLINICAL DATA:  fall EXAM: PORTABLE CHEST 1 VIEW COMPARISON:  Chest x-ray 03/24/2022 FINDINGS: The heart and mediastinal contours are unchanged. Mitral annular calcification. No focal consolidation. Chronic coarsened interstitial marking with no overt pulmonary edema. No pleural effusion. No pneumothorax. No acute  osseous abnormality. IMPRESSION: No active disease. Electronically Signed   By: Morgane  Naveau M.D.   On: 06/14/2024 18:44   DG Hand Complete Right Result Date: 06/14/2024 CLINICAL DATA:  injury EXAM: RIGHT HAND - COMPLETE 3+ VIEW COMPARISON:  None Available. FINDINGS: Diffusely decreased bone density. There is no evidence of fracture or dislocation. There is no evidence of arthropathy or other focal bone abnormality. Soft tissues are unremarkable. IMPRESSION: No acute displaced fracture or dislocation. Electronically Signed   By: Morgane  Naveau M.D.   On: 06/14/2024 18:43   DG Pelvis Portable Result Date: 06/14/2024 CLINICAL DATA:  fall EXAM: PORTABLE PELVIS 1-2 VIEWS COMPARISON:  February 18, 2022 FINDINGS: No evidence of pelvic fracture or diastasis.No acute hip fracture or dislocation.There is no evidence of arthropathy or other focal bone abnormality.Soft tissues are unremarkable. IMPRESSION: No acute fracture, pelvic bone diastasis, or dislocation. Electronically Signed   By: Rogelia Myers M.D.   On: 06/14/2024 18:41   CT Head Wo Contrast Addendum Date: 06/14/2024 ADDENDUM REPORT: 06/14/2024 17:58 ADDENDUM: These results were called by telephone at the time of interpretation on 06/14/2024 at 5:57 pm to provider BERNARDA PEREYRA , who verbally acknowledged these results. Electronically Signed   By: Morgane  Naveau M.D.   On: 06/14/2024 17:58   Result Date: 06/14/2024 CLINICAL DATA:  Head trauma, minor (Age >= 65y); Neck trauma (Age >= 65y) .  Fall EXAM: CT HEAD WITHOUT CONTRAST CT CERVICAL SPINE WITHOUT CONTRAST TECHNIQUE: Multidetector CT imaging of the head and cervical spine was performed following the standard protocol without intravenous contrast. Multiplanar CT image reconstructions of the cervical spine were also generated. RADIATION DOSE REDUCTION: This exam was performed according to the departmental dose-optimization program which includes automated exposure control, adjustment of the mA and/or kV  according to patient size and/or use of iterative reconstruction technique. COMPARISON:  CT head and C-spine 03/14/2024 FINDINGS: CT HEAD FINDINGS Brain: Cerebral ventricle sizes are concordant with the degree of cerebral volume loss. Patchy and confluent areas of decreased attenuation are noted throughout the deep and periventricular white matter of the cerebral hemispheres bilaterally, compatible with chronic microvascular ischemic disease. No evidence of large-territorial acute infarction. No parenchymal hemorrhage. No mass At least moderate volume right sylvian fissure and temporal occipital subarachnoid hemorrhage (6:14). No mass effect or midline shift. No hydrocephalus. Basilar cisterns are patent. Vascular: No hyperdense vessel. Skull: No acute fracture or focal lesion. Sinuses/Orbits: Paranasal sinuses and mastoid air cells are clear. The orbits are unremarkable. Other: Right frontal scalp hematoma measuring up to 4 mm. CT CERVICAL SPINE FINDINGS Alignment: Normal. Skull base and vertebrae: Multilevel mild facet arthropathy. No acute fracture. No aggressive appearing focal osseous lesion or focal pathologic process. Soft tissues and spinal canal: No prevertebral fluid or swelling. No visible canal hematoma. Upper chest: Unremarkable. Other: None. IMPRESSION: 1. At least moderate volume right sylvian fissure and temporal occipital subarachnoid hemorrhage. 2. No acute displaced fracture or  traumatic listhesis of the cervical spine. Electronically Signed: By: Morgane  Naveau M.D. On: 06/14/2024 17:53   CT Cervical Spine Wo Contrast Addendum Date: 06/14/2024 ADDENDUM REPORT: 06/14/2024 17:58 ADDENDUM: These results were called by telephone at the time of interpretation on 06/14/2024 at 5:57 pm to provider BERNARDA PEREYRA , who verbally acknowledged these results. Electronically Signed   By: Morgane  Naveau M.D.   On: 06/14/2024 17:58   Result Date: 06/14/2024 CLINICAL DATA:  Head trauma, minor (Age >= 65y); Neck  trauma (Age >= 65y) .  Fall EXAM: CT HEAD WITHOUT CONTRAST CT CERVICAL SPINE WITHOUT CONTRAST TECHNIQUE: Multidetector CT imaging of the head and cervical spine was performed following the standard protocol without intravenous contrast. Multiplanar CT image reconstructions of the cervical spine were also generated. RADIATION DOSE REDUCTION: This exam was performed according to the departmental dose-optimization program which includes automated exposure control, adjustment of the mA and/or kV according to patient size and/or use of iterative reconstruction technique. COMPARISON:  CT head and C-spine 03/14/2024 FINDINGS: CT HEAD FINDINGS Brain: Cerebral ventricle sizes are concordant with the degree of cerebral volume loss. Patchy and confluent areas of decreased attenuation are noted throughout the deep and periventricular white matter of the cerebral hemispheres bilaterally, compatible with chronic microvascular ischemic disease. No evidence of large-territorial acute infarction. No parenchymal hemorrhage. No mass At least moderate volume right sylvian fissure and temporal occipital subarachnoid hemorrhage (6:14). No mass effect or midline shift. No hydrocephalus. Basilar cisterns are patent. Vascular: No hyperdense vessel. Skull: No acute fracture or focal lesion. Sinuses/Orbits: Paranasal sinuses and mastoid air cells are clear. The orbits are unremarkable. Other: Right frontal scalp hematoma measuring up to 4 mm. CT CERVICAL SPINE FINDINGS Alignment: Normal. Skull base and vertebrae: Multilevel mild facet arthropathy. No acute fracture. No aggressive appearing focal osseous lesion or focal pathologic process. Soft tissues and spinal canal: No prevertebral fluid or swelling. No visible canal hematoma. Upper chest: Unremarkable. Other: None. IMPRESSION: 1. At least moderate volume right sylvian fissure and temporal occipital subarachnoid hemorrhage. 2. No acute displaced fracture or traumatic listhesis of the  cervical spine. Electronically Signed: By: Morgane  Naveau M.D. On: 06/14/2024 17:53      Willie [address] T. Teddy Rebstock [facility]  If 7PM-7AM, please contact night-coverage [email or web address] 06/15/2024, 11:41 AM

## 2024-06-16 DIAGNOSIS — Z86711 Personal history of pulmonary embolism: Secondary | ICD-10-CM | POA: Diagnosis not present

## 2024-06-16 DIAGNOSIS — I10 Essential (primary) hypertension: Secondary | ICD-10-CM | POA: Diagnosis not present

## 2024-06-16 DIAGNOSIS — I609 Nontraumatic subarachnoid hemorrhage, unspecified: Secondary | ICD-10-CM | POA: Diagnosis not present

## 2024-06-16 DIAGNOSIS — E7800 Pure hypercholesterolemia, unspecified: Secondary | ICD-10-CM | POA: Diagnosis not present

## 2024-06-16 LAB — RENAL FUNCTION PANEL
Albumin: 3.2 g/dL — ABNORMAL LOW (ref 3.5–5.0)
Anion gap: 8 (ref 5–15)
BUN: 8 mg/dL (ref 8–23)
CO2: 22 mmol/L (ref 22–32)
Calcium: 10 mg/dL (ref 8.9–10.3)
Chloride: 111 mmol/L (ref 98–111)
Creatinine, Ser: 0.74 mg/dL (ref 0.44–1.00)
GFR, Estimated: >60 mL/min (ref >60)
Glucose, Bld: 119 mg/dL — ABNORMAL HIGH (ref 70–99)
Phosphorus: 2.2 mg/dL — ABNORMAL LOW (ref 2.5–4.6)
Potassium: 3.9 mmol/L (ref 3.5–5.1)
Sodium: 141 mmol/L (ref 135–145)

## 2024-06-16 LAB — MAGNESIUM: Magnesium: 1.8 mg/dL (ref 1.7–2.4)

## 2024-06-16 LAB — MRSA NEXT GEN BY PCR, NASAL: MRSA by PCR Next Gen: NOT DETECTED

## 2024-06-16 MED ORDER — LAMOTRIGINE 25 MG PO TABS: 50.0000 mg | ORAL_TABLET | Freq: Every day | ORAL | Status: DC

## 2024-06-16 MED ORDER — SENNOSIDES-DOCUSATE SODIUM 8.6-50 MG PO TABS: 1.0000 | ORAL_TABLET | Freq: Two times a day (BID) | ORAL | Status: AC | PRN

## 2024-06-16 MED ORDER — MULTIVITAMIN PLUS IRON ADULT PO TABS: 1.0000 | ORAL_TABLET | Freq: Every day | ORAL | Status: AC

## 2024-06-16 NOTE — Plan of Care (Signed)
  Problem: Health Behavior/Discharge Planning: Goal: Ability to manage health-related needs will improve Outcome: Progressing   Problem: Clinical Measurements: Goal: Will remain free from infection Outcome: Progressing   Problem: Activity: Goal: Risk for activity intolerance will decrease Outcome: Progressing   Problem: Nutrition: Goal: Adequate nutrition will be maintained Outcome: Progressing   Problem: Coping: Goal: Level of anxiety will decrease Outcome: Progressing   Problem: Elimination: Goal: Will not experience complications related to bowel motility Outcome: Progressing   Problem: Safety: Goal: Ability to remain free from injury will improve Outcome: Progressing   Problem: Skin Integrity: Goal: Risk for impaired skin integrity will decrease Outcome: Progressing

## 2024-06-16 NOTE — Progress Notes (Signed)
 Attempted to call and give report to staff @ [facility] [phone]. NO answer at this time. Left message for facility to call RN back for report or additional question.

## 2024-06-16 NOTE — Progress Notes (Signed)
 DC order noted per MD for transfer. Transfer packet developed with medical necessity/AVS. DNR pending signature by provider for transfer.

## 2024-06-16 NOTE — Progress Notes (Addendum)
 RNCM called [facility] at [facility], home health agency  used by ALF.  ALF requested that hospital arrange  services.  Confirmation of services received.

## 2024-06-16 NOTE — Discharge Summary (Signed)
 Physician Discharge Summary  Sarah Cook:[id number] DOB: 08-05-1951 DOA: 06/14/2024  PCP: Barbaraann Harvey, FNP  Admit date: 06/14/2024 Discharge date: 06/16/24  Admitted From: ALF Disposition: ALF Recommendations for Outpatient Follow-up:  Outpatient follow-up with PCP in 1 to 2 weeks Check CMP and CBC in 1 to 2 weeks Please follow up on the following pending results: None  Home Health: HH PT/OT/SLP Equipment/Devices: None recommended by therapy  Discharge Condition: Stable CODE STATUS: DNR Diet Orders (From admission, onward)     Start     Ordered   06/15/24 1007  DIET DYS 3 Room service appropriate? Yes; Fluid consistency: Thin  Diet effective now       Comments: Setup assistance, feeding assist as needed  Question Answer Comment  Room service appropriate? Yes   Fluid consistency: Thin      06/15/24 1007             Follow-up Information     Barbaraann Harvey, FNP. Schedule an appointment as soon as possible for a visit in 1 week(s).   Specialty: Family Medicine Contact information: [address] [place] [address] [phone]                 Hospital course 73 year old F with PMH of severe dementia, PE on Eliquis , primary hyperparathyroidism, HTN, HLD, urinary incontinence and TIA brought to ED by EMS after staff at [facility] noticed hematoma to the right side of her forehead.  Unclear how she injured her head.   In the ED, slightly tachycardic and hypertensive.  Basic labs without significant finding.  INR normal.  CT head showed at least moderate volume right sylvian fissure and temporal occipital subarachnoid hemorrhage.  CT cervical spine, chest, right hand and pelvic x-ray without significant finding.  Eliquis  reversed by [facility] .  Neurosurgery, Dr. Debby consulted and recommended admission to to medicine service, holding anticoagulation and repeat CT the morning.    Repeat CT the next day showed stable right parietal, right  temporoparietal, and sylvian fissure subarachnoid hemorrhage.  Cleared for discharge by neurosurgery.  Neurosurgery recommended holding Eliquis  for 7 to 10 days and considering cessation given high risk for fall and subsequent ICH.  HH PT/OT/SLP ordered as recommended by therapy.    See individual problem list below for more.   Problems addressed during this hospitalization Presumed fall at facility Right temporoparietal SAH likely due to fall on Eliquis : Patient with history of PE.  On Eliquis  for anticoagulation.  Eliquis  reversed by [facility]  in ED.  Repeat CT head with stable SAH.  Cleared for discharge by neurosurgery.  Eliquis  discontinued.  -Fall precaution -PT/OT/SLP   History of subsegmental PE: Seems to be remote - Discontinued Eliquis  indefinitely given high risk for fall with subsequent ICH and remote PE   Essential hypertension: BP within acceptable range. -Continue home meds.   Dementia without behavioral disturbance: Seems to be severe.  Awake and alert but only oriented to self.  Inconsistently follow commands. -Reorientation and delirium precaution -Continue home Lamictal  and Ativan     Hyperlipidemia: Continue with statin   Hypokalemia: Replenished.  There is no height or weight on file to calculate BMI.           Consultations: Neurosurgery  Time spent 35  minutes  Vital signs Vitals:   06/15/24 1952 06/15/24 2314 06/16/24 0338 06/16/24 0836  BP: (!) 155/99 (!) 154/69 (!) 147/72 (!) 144/68  Pulse: 93 80 79 79  Temp: 97.9 F (36.6 C) 98.6  F (37 C) 97.8 F (36.6 C) 97.8 F (36.6 C)  Resp: 17 16 16 20   SpO2: 95% 97% 97% 96%  TempSrc: Oral Oral Oral Oral     Discharge exam  GENERAL: No apparent distress.  Nontoxic. HEENT: MMM.  Vision and hearing grossly intact.  NECK: Supple.  No apparent JVD.  RESP:  No IWOB.  Fair aeration bilaterally. CVS:  RRR. Heart sounds normal.  ABD/GI/GU: BS+. Abd soft, NTND.  MSK/EXT:  Moves extremities. No  apparent deformity. No edema.  SKIN: no apparent skin lesion or wound NEURO: Awake but not oriented.  Barely follows commands.  No apparent focal neuro deficit. PSYCH: Calm. Normal affect.   Discharge Instructions Discharge Instructions     Increase activity slowly   Complete by: As directed       Allergies as of 06/16/2024       Reactions   Anesthetics, Ester Nausea And Vomiting   Not listed on MAR   Codeine Nausea Only   Not listed on MAR   Tape Other (See Comments)   Skin blisters Not listed on MAR   Hydrochlorothiazide Other (See Comments)   Unknown reaction Not listed on MAR   Hydrocodone Nausea And Vomiting   Not listed on MAR   Norvasc  [amlodipine ] Swelling   Not listed on MAR   Zyrtec [cetirizine Hcl]    AMS Not listed on MAR   Latex Itching, Rash   Not listed on MAR        Medication List     STOP taking these medications    apixaban  5 MG Tabs tablet Commonly known as: ELIQUIS        TAKE these medications    docusate sodium  100 MG capsule Commonly known as: COLACE Take 100 mg by mouth every evening.   feeding supplement Liqd Take 237 mLs by mouth 2 (two) times daily between meals.   hydrALAZINE  50 MG tablet Commonly known as: APRESOLINE  Take 50 mg by mouth 2 (two) times daily.   irbesartan  300 MG tablet Commonly known as: AVAPRO  Take 1 tablet (300 mg total) by mouth daily.   lamoTRIgine  100 MG tablet Commonly known as: LaMICtal  Take 1 tablet (100 mg total) by mouth 2 (two) times daily.   lamoTRIgine  50 MG Tbdp Take 1 tablet by mouth at bedtime.   LORazepam  0.5 MG tablet Commonly known as: ATIVAN  Take 0.5 mg by mouth every 6 (six) hours as needed (agitation/restlessness).   LORazepam  0.5 MG tablet Commonly known as: ATIVAN  Take 0.25 mg by mouth in the morning and at bedtime. Take 1/2 tablet (0.25mg ) by mouth in the morning and at bedtime for agitation/behavior.   Multivitamin Plus Iron  Adult Tabs Take 1 tablet by mouth daily.    senna-docusate 8.6-50 MG tablet Commonly known as: Senokot-S Take 1 tablet by mouth 2 (two) times daily between meals as needed for mild constipation.         Procedures/Studies:    CT HEAD WO CONTRAST ( ) Result Date: 06/15/2024 EXAM: CT HEAD WITHOUT CONTRAST 06/15/2024 10:49:43 AM TECHNIQUE: CT of the head was performed without the administration of intravenous contrast. Automated exposure control, iterative reconstruction, and/or weight based adjustment of the mA/kV was utilized to reduce the radiation dose to as low as reasonably achievable. COMPARISON: CT head without contrast 06/14/2024. CLINICAL HISTORY: Subarachnoid hemorrhage [facility]). FINDINGS: BRAIN AND VENTRICLES: Right parietal, right temporoparietal, and sylvian fissure subarachnoid hemorrhage is stable. Advanced atrophy and white matter disease is again noted. No hydrocephalus. No extra-axial collection.  No mass effect or midline shift. ORBITS: No acute abnormality. SINUSES: No acute abnormality. SOFT TISSUES AND SKULL: No acute soft tissue abnormality. No skull fracture. IMPRESSION: 1. Stable right parietal, right temporoparietal, and sylvian fissure subarachnoid hemorrhage. 2. Advanced atrophy and white matter disease. Electronically signed by: Lonni Necessary MD 06/15/2024 11:02 AM EDT RP Workstation: [id number]   DG Chest Portable 1 View Result Date: 06/14/2024 CLINICAL DATA:  fall EXAM: PORTABLE CHEST 1 VIEW COMPARISON:  Chest x-ray 03/24/2022 FINDINGS: The heart and mediastinal contours are unchanged. Mitral annular calcification. No focal consolidation. Chronic coarsened interstitial marking with no overt pulmonary edema. No pleural effusion. No pneumothorax. No acute osseous abnormality. IMPRESSION: No active disease. Electronically Signed   By: Morgane  Naveau M.D.   On: 06/14/2024 18:44   DG Hand Complete Right Result Date: 06/14/2024 CLINICAL DATA:  injury EXAM: RIGHT HAND - COMPLETE 3+ VIEW COMPARISON:  None  Available. FINDINGS: Diffusely decreased bone density. There is no evidence of fracture or dislocation. There is no evidence of arthropathy or other focal bone abnormality. Soft tissues are unremarkable. IMPRESSION: No acute displaced fracture or dislocation. Electronically Signed   By: Morgane  Naveau M.D.   On: 06/14/2024 18:43   DG Pelvis Portable Result Date: 06/14/2024 CLINICAL DATA:  fall EXAM: PORTABLE PELVIS 1-2 VIEWS COMPARISON:  February 18, 2022 FINDINGS: No evidence of pelvic fracture or diastasis.No acute hip fracture or dislocation.There is no evidence of arthropathy or other focal bone abnormality.Soft tissues are unremarkable. IMPRESSION: No acute fracture, pelvic bone diastasis, or dislocation. Electronically Signed   By: Rogelia Myers M.D.   On: 06/14/2024 18:41   CT Head Wo Contrast Addendum Date: 06/14/2024 ADDENDUM REPORT: 06/14/2024 17:58 ADDENDUM: These results were called by telephone at the time of interpretation on 06/14/2024 at 5:57 pm to provider BERNARDA PEREYRA , who verbally acknowledged these results. Electronically Signed   By: Morgane  Naveau M.D.   On: 06/14/2024 17:58   Result Date: 06/14/2024 CLINICAL DATA:  Head trauma, minor (Age >= 65y); Neck trauma (Age >= 65y) .  Fall EXAM: CT HEAD WITHOUT CONTRAST CT CERVICAL SPINE WITHOUT CONTRAST TECHNIQUE: Multidetector CT imaging of the head and cervical spine was performed following the standard protocol without intravenous contrast. Multiplanar CT image reconstructions of the cervical spine were also generated. RADIATION DOSE REDUCTION: This exam was performed according to the departmental dose-optimization program which includes automated exposure control, adjustment of the mA and/or kV according to patient size and/or use of iterative reconstruction technique. COMPARISON:  CT head and C-spine 03/14/2024 FINDINGS: CT HEAD FINDINGS Brain: Cerebral ventricle sizes are concordant with the degree of cerebral volume loss. Patchy and  confluent areas of decreased attenuation are noted throughout the deep and periventricular white matter of the cerebral hemispheres bilaterally, compatible with chronic microvascular ischemic disease. No evidence of large-territorial acute infarction. No parenchymal hemorrhage. No mass At least moderate volume right sylvian fissure and temporal occipital subarachnoid hemorrhage (6:14). No mass effect or midline shift. No hydrocephalus. Basilar cisterns are patent. Vascular: No hyperdense vessel. Skull: No acute fracture or focal lesion. Sinuses/Orbits: Paranasal sinuses and mastoid air cells are clear. The orbits are unremarkable. Other: Right frontal scalp hematoma measuring up to 4 mm. CT CERVICAL SPINE FINDINGS Alignment: Normal. Skull base and vertebrae: Multilevel mild facet arthropathy. No acute fracture. No aggressive appearing focal osseous lesion or focal pathologic process. Soft tissues and spinal canal: No prevertebral fluid or swelling. No visible canal hematoma. Upper chest: Unremarkable. Other: None. IMPRESSION: 1. At least moderate  volume right sylvian fissure and temporal occipital subarachnoid hemorrhage. 2. No acute displaced fracture or traumatic listhesis of the cervical spine. Electronically Signed: By: Morgane  Naveau M.D. On: 06/14/2024 17:53   CT Cervical Spine Wo Contrast Addendum Date: 06/14/2024 ADDENDUM REPORT: 06/14/2024 17:58 ADDENDUM: These results were called by telephone at the time of interpretation on 06/14/2024 at 5:57 pm to provider BERNARDA PEREYRA , who verbally acknowledged these results. Electronically Signed   By: Morgane  Naveau M.D.   On: 06/14/2024 17:58   Result Date: 06/14/2024 CLINICAL DATA:  Head trauma, minor (Age >= 65y); Neck trauma (Age >= 65y) .  Fall EXAM: CT HEAD WITHOUT CONTRAST CT CERVICAL SPINE WITHOUT CONTRAST TECHNIQUE: Multidetector CT imaging of the head and cervical spine was performed following the standard protocol without intravenous contrast.  Multiplanar CT image reconstructions of the cervical spine were also generated. RADIATION DOSE REDUCTION: This exam was performed according to the departmental dose-optimization program which includes automated exposure control, adjustment of the mA and/or kV according to patient size and/or use of iterative reconstruction technique. COMPARISON:  CT head and C-spine 03/14/2024 FINDINGS: CT HEAD FINDINGS Brain: Cerebral ventricle sizes are concordant with the degree of cerebral volume loss. Patchy and confluent areas of decreased attenuation are noted throughout the deep and periventricular white matter of the cerebral hemispheres bilaterally, compatible with chronic microvascular ischemic disease. No evidence of large-territorial acute infarction. No parenchymal hemorrhage. No mass At least moderate volume right sylvian fissure and temporal occipital subarachnoid hemorrhage (6:14). No mass effect or midline shift. No hydrocephalus. Basilar cisterns are patent. Vascular: No hyperdense vessel. Skull: No acute fracture or focal lesion. Sinuses/Orbits: Paranasal sinuses and mastoid air cells are clear. The orbits are unremarkable. Other: Right frontal scalp hematoma measuring up to 4 mm. CT CERVICAL SPINE FINDINGS Alignment: Normal. Skull base and vertebrae: Multilevel mild facet arthropathy. No acute fracture. No aggressive appearing focal osseous lesion or focal pathologic process. Soft tissues and spinal canal: No prevertebral fluid or swelling. No visible canal hematoma. Upper chest: Unremarkable. Other: None. IMPRESSION: 1. At least moderate volume right sylvian fissure and temporal occipital subarachnoid hemorrhage. 2. No acute displaced fracture or traumatic listhesis of the cervical spine. Electronically Signed: By: Morgane  Naveau M.D. On: 06/14/2024 17:53       The results of significant diagnostics from this hospitalization (including imaging, microbiology, ancillary and laboratory) are listed below for  reference.     Microbiology: No results found for this or any previous visit (from the past 240 hours).   Labs:  CBC: Recent Labs  Lab 06/14/24 1749 06/15/24 0250  WBC 10.7* 6.6  NEUTROABS 9.0*  --   HGB 12.0 11.0*  HCT 36.0 32.0*  MCV 94.0 91.4  PLT 314 255   BMP &GFR Recent Labs  Lab 06/14/24 1749 06/15/24 0250 06/15/24 0814  NA 142 141  --   K 3.6 3.4*  --   CL 108 110  --   CO2 24 23  --   GLUCOSE 110* 120*  --   BUN 19 17  --   CREATININE 0.84 0.77  --   CALCIUM  10.2 9.8  --   MG  --   --  1.9   CrCl cannot be calculated (Unknown ideal weight.). Liver & Pancreas: Recent Labs  Lab 06/15/24 0250  AST 17  ALT 13  ALKPHOS 61  BILITOT 1.2  PROT 5.7*  ALBUMIN 3.2*   No results for input(s): LIPASE, AMYLASE in the last 168 hours. No results  for input(s): AMMONIA in the last 168 hours. Diabetic: No results for input(s): HGBA1C in the last 72 hours. No results for input(s): GLUCAP in the last 168 hours. Cardiac Enzymes: No results for input(s): CKTOTAL, CKMB, CKMBINDEX, TROPONINI in the last 168 hours. No results for input(s): PROBNP in the last 8760 hours. Coagulation Profile: Recent Labs  Lab 06/14/24 1749  INR 1.0   Thyroid  Function Tests: No results for input(s): TSH, T4TOTAL, FREET4, T3FREE, THYROIDAB in the last 72 hours. Lipid Profile: No results for input(s): CHOL, HDL, LDLCALC, TRIG, CHOLHDL, LDLDIRECT in the last 72 hours. Anemia Panel: No results for input(s): VITAMINB12, FOLATE, FERRITIN, TIBC, IRON , RETICCTPCT in the last 72 hours. Urine analysis:    Component Value Date/Time   COLORURINE STRAW (A) 03/24/2022 2022   APPEARANCEUR CLEAR 03/24/2022 2022   LABSPEC 1.006 03/24/2022 2022   PHURINE 5.0 03/24/2022 2022   GLUCOSEU NEGATIVE 03/24/2022 2022   HGBUR NEGATIVE 03/24/2022 2022   BILIRUBINUR NEGATIVE 03/24/2022 2022   KETONESUR NEGATIVE 03/24/2022 2022   PROTEINUR NEGATIVE  03/24/2022 2022   UROBILINOGEN 0.2 02/20/2009 1445   NITRITE NEGATIVE 03/24/2022 2022   LEUKOCYTESUR SMALL (A) 03/24/2022 2022   Sepsis Labs: Invalid input(s): PROCALCITONIN, LACTICIDVEN   SIGNED:  Sulema Braid T Ada Holness, MD  [facility] 06/16/2024, 10:03 AM

## 2024-06-16 NOTE — TOC Transition Note (Signed)
 Transition of Care [facility]) - Discharge Note   Patient Details  Name: Sarah Cook MRN: [id number] Date of Birth: 16-Dec-1950  [facility] of [facility]) CM/SW Contact:  Gwenn [place], [place] Phone Number: 06/16/2024, 11:38 AM   Clinical Narrative: Pt admitted from [facility]. Per MD, pt ready for dc today. Spoke to [place] at [facility] [phone] who confirmed pt is able to return. Therapy recommending HHPT/OT/SLP. [facility] requesting referral to [facility] for [facility] services as pt's insurance is OON with their in-house therapy. CM to make referral to [facility]. Pt's spouse Starleen Cherry is aware of dc and reports agreeable. RN provided with number for report and PTAR arranged for transport. DC summary, signed FL2, and HH orders in dc packet. SW signing off at dc.   Julien Gwenn, MSW, LCSW [phone] (coverage)      Final next level of care: Memory Care Barriers to Discharge: Barriers Resolved   Patient Goals and CMS Choice            Discharge Placement                Patient to be transferred to facility by: PTAR Name of family member notified: [name]/Spouse Patient and family notified of of transfer: 06/16/24  Discharge Plan and Services Additional resources added to the After Visit Summary for                                       Social Drivers of Health (SDOH) Interventions SDOH Screenings   Food Insecurity: Patient Unable To Answer (06/15/2024)  Housing: Unknown (06/15/2024)  Transportation Needs: Patient Unable To Answer (06/15/2024)  Utilities: Patient Unable To Answer (06/15/2024)  Social Connections: Patient Unable To Answer (06/15/2024)  Tobacco Use: Low Risk  (06/14/2024)     Readmission Risk Interventions    01/26/2022    2:21 PM  Readmission Risk Prevention Plan  Transportation Screening Complete  PCP or Specialist Appt within 3-5 Days Complete  HRI or Home Care Consult Complete  Social Work Consult for Recovery  Care Planning/Counseling Complete  Palliative Care Screening Not Applicable  Medication Review [employer]) Complete

## 2024-06-16 NOTE — TOC CAGE-AID Note (Signed)
 Transition of Care [facility]) - CAGE-AID Screening   Patient Details  Name: Sarah Cook MRN: [id number] Date of Birth: [birth year]  Transition of Care [facility]) CM/SW Contact:    LEBRON ROCKIE ORN, RN Phone Number: [phone] 06/16/2024, 12:22 PM   Clinical Narrative: Pt is from [facility] memory care unit and sustained a hematoma due to a fall a couple of days ago.  Pt has a hx of dementia, therefore, screening is unable to be completed.    CAGE-AID Screening: Substance Abuse Screening unable to be completed due to: : Patient unable to participate

## 2024-06-16 NOTE — NC FL2 (Signed)
 [place]  MEDICAID FL2 LEVEL OF CARE FORM     IDENTIFICATION  Patient Name: Sarah Cook Birthdate: 1951-01-14 Sex: female Admission Date (Current Location): 06/14/2024  [facility] and [place] Number:  Producer, television/film/video and Address:  [facility], [address] [place], [place] [address]      Provider Number: [id number]  Attending Physician Name and Address:  Kathrin Mignon DASEN, MD  Relative Name and Phone Number:       Current Level of Care: Hospital Recommended Level of Care: Memory Care Prior Approval Number:    Date Approved/Denied:   PASRR Number:    Discharge Plan: Other (Comment) (Memory Care)    Current Diagnoses: Patient Active Problem List   Diagnosis Date Noted   Primary hyperparathyroidism (HCC) 06/15/2024   Primary hypertension 06/15/2024   CAD (coronary artery disease) 06/15/2024   Subarachnoid hemorrhage (HCC) 06/14/2024   Acute metabolic encephalopathy 03/25/2022   COVID-19 virus infection 03/25/2022   Left supracondylar humerus fracture 03/25/2022   Severe sepsis (HCC) 03/24/2022   Generalized weakness 01/25/2022   Decreased oral intake 01/25/2022   History of pulmonary embolism 01/25/2022   Obesity (BMI 30-39.9) 01/25/2022   Malnutrition of moderate degree 01/25/2022   Pulmonary embolism (HCC) 11/07/2021   Acute encephalopathy 11/06/2021   Chronic diastolic heart failure (HCC) 11/06/2021   Anemia 11/06/2021   E-coli UTI 10/19/2021   Calcium  blood increased 10/18/2021   Dementia (HCC) 07/22/2021   Hypertensive urgency 07/21/2021   Refractory hypokalemia 07/20/2021   Generalized muscle weakness 07/20/2021   Encephalomalacia 07/20/2021   Urinary incontinence 07/20/2021   Hypophosphatemia 07/20/2021   Memory loss 06/23/2021   Confusion 06/23/2021   Encephalopathy acute 10/18/2017   Post-concussion headache 10/18/2017   Altered mental status 10/11/2017   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HCC) 03/09/2016    Chest pain Feb 17, 2016   History of antineoplastic chemotherapy with cardiotoxic drugs February 17, 2016   Family history of sudden cardiac death in father 2016/02/17   History of breast cancer    Hemorrhoids    Hiatal hernia    Pulmonary nodules    Hyperlipidemia    Heart disease    Allergy    Mood disorder (HCC)    TIA (transient ischemic attack)    Cataract    Osteoporosis    Parathyroid  disorder (HCC)    Irritable bowel syndrome 12/11/2013   Headache 12/10/2013    Orientation RESPIRATION BLADDER Height & Weight      (disoriented x4)  Normal Incontinent Weight:   Height:     BEHAVIORAL SYMPTOMS/MOOD NEUROLOGICAL BOWEL NUTRITION STATUS      Continent Diet (Dys 3 (mechancial soft) solids, thin liquids)  AMBULATORY STATUS COMMUNICATION OF NEEDS Skin   Limited Assist Verbally (dysphagia) Normal                       Personal Care Assistance Level of Assistance  Bathing, Feeding, Dressing Bathing Assistance: Limited assistance Feeding assistance: Limited assistance Dressing Assistance: Maximum assistance     Functional Limitations Info  Sight, Hearing, Speech Sight Info: Adequate Hearing Info: Adequate Speech Info: Impaired    SPECIAL CARE FACTORS FREQUENCY  PT (By licensed PT), OT (By licensed OT), Speech therapy     PT Frequency: eval and treat OT Frequency: eval and treat     Speech Therapy Frequency: eval and treat      Contractures Contractures Info: Not present    Additional Factors Info  Code Status Code Status  Info: DNR             Current Medications (06/16/2024):  This is the current hospital active medication list Current Facility-Administered Medications  Medication Dose Route Frequency Provider Last Rate Last Admin   acetaminophen  (TYLENOL ) tablet 650 mg  650 mg Oral Q6H PRN Sim Emery CROME, MD       Or   acetaminophen  (TYLENOL ) suppository 650 mg  650 mg Rectal Q6H PRN Sim Emery CROME, MD       dextrose  5% lactated ringers  1,000 mL  with potassium chloride  40 mEq infusion   Intravenous Continuous Gonfa, Taye T, MD 50 mL/hr at 06/16/24 0851 New Bag at 06/16/24 0851   irbesartan  (AVAPRO ) tablet 300 mg  300 mg Oral Daily Gonfa, Taye T, MD   300 mg at 06/16/24 0854   lamoTRIgine  (LAMICTAL ) tablet 100 mg  100 mg Oral BID Sim Emery L, MD   100 mg at 06/16/24 0854   lamoTRIgine  (LAMICTAL ) tablet 50 mg  50 mg Oral QHS Gonfa, Taye T, MD       LORazepam  (ATIVAN ) tablet 0.5 mg  0.5 mg Oral Q6H PRN Garba, Mohammad L, MD       ondansetron  (ZOFRAN ) tablet 4 mg  4 mg Oral Q6H PRN Sim Emery CROME, MD       Or   ondansetron  (ZOFRAN ) injection 4 mg  4 mg Intravenous Q6H PRN Garba, Mohammad L, MD         Discharge Medications: Please see discharge summary for a list of discharge medications.  Relevant Imaging Results:  Relevant Lab Results:   Additional Information SS#[id number]; [facility] to provide HHPT/OT/SLP services at [facility], [place], [place]

## 2024-08-09 ENCOUNTER — Emergency Department (HOSPITAL_COMMUNITY)

## 2024-08-09 ENCOUNTER — Encounter (HOSPITAL_COMMUNITY): Payer: Self-pay

## 2024-08-09 ENCOUNTER — Emergency Department (HOSPITAL_COMMUNITY)
Admission: EM | Admit: 2024-08-09 | Discharge: 2024-08-09 | Disposition: A | Discharge status: Skilled Nursing Facility | Attending: Emergency Medicine | Admitting: Emergency Medicine

## 2024-08-09 DIAGNOSIS — Z79899 Other long term (current) drug therapy: Secondary | ICD-10-CM | POA: Insufficient documentation

## 2024-08-09 DIAGNOSIS — Z8673 Personal history of transient ischemic attack (TIA), and cerebral infarction without residual deficits: Secondary | ICD-10-CM | POA: Insufficient documentation

## 2024-08-09 DIAGNOSIS — Y92001 Dining room of unspecified non-institutional (private) residence as the place of occurrence of the external cause: Secondary | ICD-10-CM | POA: Insufficient documentation

## 2024-08-09 DIAGNOSIS — W010XXA Fall on same level from slipping, tripping and stumbling without subsequent striking against object, initial encounter: Secondary | ICD-10-CM | POA: Insufficient documentation

## 2024-08-09 DIAGNOSIS — S42034A Nondisplaced fracture of lateral end of right clavicle, initial encounter for closed fracture: Secondary | ICD-10-CM | POA: Insufficient documentation

## 2024-08-09 DIAGNOSIS — S0083XA Contusion of other part of head, initial encounter: Secondary | ICD-10-CM | POA: Insufficient documentation

## 2024-08-09 DIAGNOSIS — I10 Essential (primary) hypertension: Secondary | ICD-10-CM

## 2024-08-09 DIAGNOSIS — I110 Hypertensive heart disease with heart failure: Secondary | ICD-10-CM | POA: Diagnosis not present

## 2024-08-09 DIAGNOSIS — M25511 Pain in right shoulder: Secondary | ICD-10-CM | POA: Diagnosis present

## 2024-08-09 DIAGNOSIS — Y9301 Activity, walking, marching and hiking: Secondary | ICD-10-CM | POA: Diagnosis not present

## 2024-08-09 DIAGNOSIS — I509 Heart failure, unspecified: Secondary | ICD-10-CM | POA: Insufficient documentation

## 2024-08-09 DIAGNOSIS — Z91040 Latex allergy status: Secondary | ICD-10-CM | POA: Insufficient documentation

## 2024-08-09 DIAGNOSIS — F0390 Unspecified dementia without behavioral disturbance: Secondary | ICD-10-CM | POA: Insufficient documentation

## 2024-08-09 DIAGNOSIS — W19XXXA Unspecified fall, initial encounter: Secondary | ICD-10-CM

## 2024-08-09 MED ORDER — HYDRALAZINE HCL 25 MG PO TABS
50.0000 mg | ORAL_TABLET | Freq: Once | ORAL | Status: AC
  Administered 2024-08-09: 50 mg via ORAL
  Filled 2024-08-09: qty 2

## 2024-08-09 NOTE — ED Notes (Signed)
 Spoke with Daria Evangelist, pts spouse and legal guardian. He was made aware of pts d/c and agreed with care plan.

## 2024-08-09 NOTE — ED Notes (Signed)
 Ptar called, next person up, 30-45 minutes

## 2024-08-09 NOTE — ED Provider Notes (Signed)
 [place] EMERGENCY DEPARTMENT AT [facility] Provider Note   CSN: [id number] Arrival date & time: 08/09/24  1556    Patient presents with: Sarah Cook Sarah Cook is a 73 y.o. female here for evaluation of fall.  Witnessed fall at living facility, [facility].  Patient has a history of dementia, TIA, CHF, hypertension.  She states she tripped and fell.  She is in pain to her right shoulder.  She is a hematoma to her right eye.  She is not anticoagulated.  She denies any syncope, chest pain, abdominal pain, numbness or weakness.   HPI     Prior to Admission medications   Medication Sig Start Date End Date Taking? Authorizing Provider  docusate sodium  (COLACE) 100 MG capsule Take 100 mg by mouth every evening.    [provider]  feeding supplement (ENSURE ENLIVE / ENSURE PLUS) LIQD Take 237 mLs by mouth 2 (two) times daily between meals. 10/21/21   Pokhrel, Laxman, MD  hydrALAZINE  (APRESOLINE ) 50 MG tablet Take 50 mg by mouth 2 (two) times daily.    [provider]  irbesartan  (AVAPRO ) 300 MG tablet Take 1 tablet (300 mg total) by mouth daily. 03/30/22   Elgergawy, Brayton RAMAN, MD  lamoTRIgine  (LAMICTAL ) 100 MG tablet Take 1 tablet (100 mg total) by mouth 2 (two) times daily. 06/23/21   Onita Duos, MD  lamoTRIgine  50 MG TBDP Take 1 tablet by mouth at bedtime. 06/10/24   [provider]  LORazepam  (ATIVAN ) 0.5 MG tablet Take 0.25 mg by mouth in the morning and at bedtime. Take 1/2 tablet (0.25mg ) by mouth in the morning and at bedtime for agitation/behavior. 01/31/24   [provider]  LORazepam  (ATIVAN ) 0.5 MG tablet Take 0.5 mg by mouth every 6 (six) hours as needed (agitation/restlessness).    [provider]  Multiple Vitamins-Iron  (MULTIVITAMIN PLUS IRON  ADULT) TABS Take 1 tablet by mouth daily. 06/16/24   Gonfa, Taye T, MD  senna-docusate (SENOKOT-S) 8.6-50 MG tablet Take 1 tablet by mouth 2 (two) times daily between meals as  needed for mild constipation. 06/16/24   Gonfa, Taye T, MD    Allergies: Anesthetics, ester; Codeine; Tape; Hydrochlorothiazide; Hydrocodone; Norvasc  [amlodipine ]; Zyrtec [cetirizine hcl]; and Latex    Review of Systems  Constitutional: Negative.   HENT: Negative.    Respiratory: Negative.    Cardiovascular: Negative.   Gastrointestinal: Negative.   Genitourinary: Negative.   Musculoskeletal:        Right shoulder pain  All other systems reviewed and are negative.   Updated Vital Signs BP (!) 190/132   Pulse 96   Temp 98.2 F (36.8 C) (Oral)   Resp 18   SpO2 97%   Physical Exam Vitals and nursing note reviewed.  Constitutional:      General: She is not in acute distress.    Appearance: She is well-developed. She is not ill-appearing, toxic-appearing or diaphoretic.  HENT:     Head: Normocephalic.     Comments: 2.5 cm hematoma right forehead.  No raccoon eyes, Battle sign, hemotympanum.  Nontender facial bones, crepitus or step-off    Nose: Nose normal.     Mouth/Throat:     Mouth: Mucous membranes are moist.  Eyes:     Extraocular Movements: Extraocular movements intact.     Conjunctiva/sclera: Conjunctivae normal.     Pupils: Pupils are equal, round, and reactive to light.     Comments: PERRLA, no traumatic hyphema  Cardiovascular:  Rate and Rhythm: Normal rate and regular rhythm.     Pulses: Normal pulses.          Radial pulses are 2+ on the right side and 2+ on the left side.       Dorsalis pedis pulses are 2+ on the right side and 2+ on the left side.     Heart sounds: Normal heart sounds.  Pulmonary:     Effort: Pulmonary effort is normal. No respiratory distress.     Breath sounds: Normal breath sounds and air entry.     Comments: Clear bilaterally, speaks in full sentences without difficulty Chest:     Comments: Nontender chest wall.  No crepitus or step-off Abdominal:     General: Bowel sounds are normal. There is no distension.     Palpations:  Abdomen is soft.     Tenderness: There is no abdominal tenderness.     Comments: Soft, nontender  Musculoskeletal:        General: Normal range of motion.     Cervical back: Normal range of motion and neck supple.     Comments: No shortening or rotation of legs.  Pelvis stable, nontender palpation.  Nontender bilateral lower extremities.  Nontender left upper extremities.  Tenderness right shoulder.  Nontender right elbow, forearm, hand  Skin:    General: Skin is warm and dry.     Capillary Refill: Capillary refill takes less than 2 seconds.     Comments: Hematoma right forehead.  Old yellow-purple bruising bilateral hands  Neurological:     General: No focal deficit present.     Mental Status: She is alert and oriented to person, place, and time.     Comments: No obvious facial droop Follows commands Equal strength bilaterally Alert to name, not year, location     (all labs ordered are listed, but only abnormal results are displayed) Labs Reviewed - No data to display  EKG: None  Radiology: CT Shoulder Right Wo Contrast Result Date: 08/09/2024 CLINICAL DATA:  Provided history: Shoulder trauma, instability or dislocation suspected, xray done EXAM: CT OF THE UPPER RIGHT EXTREMITY WITHOUT CONTRAST TECHNIQUE: Multidetector CT imaging of the upper right extremity was performed according to the standard protocol. RADIATION DOSE REDUCTION: This exam was performed according to the departmental dose-optimization program which includes automated exposure control, adjustment of the mA and/or kV according to patient size and/or use of iterative reconstruction technique. COMPARISON:  Shoulder radiograph earlier today FINDINGS: Bones/Joint/Cartilage Distal clavicle fracture, series 4, image 10 has sclerotic margins and is suggestive of a subacute or chronic fracture. No involvement of the acromioclavicular joint. No additional fracture. No glenohumeral dislocation. The acromioclavicular joint is  normally aligned. Incidental bone island in the proximal humerus. Ligaments Suboptimally assessed by CT. Muscles and Tendons No evidence of intramuscular hematoma or fatty atrophy. Soft tissues Remote right rib fractures have callus formation. No acute fracture of included ribs. IMPRESSION: 1. Distal clavicle fracture has sclerotic margins and is suggestive of a subacute or chronic fracture. No involvement of the acromioclavicular joint. 2. No additional fracture. No glenohumeral dislocation. Electronically Signed   By: Andrea Gasman M.D.   On: 08/09/2024 18:55   CT Cervical Spine Wo Contrast Result Date: 08/09/2024 EXAM: CT CERVICAL SPINE WITHOUT CONTRAST 08/09/2024 05:14:57 PM TECHNIQUE: CT of the cervical spine was performed without the administration of intravenous contrast. Multiplanar reformatted images are provided for review. Automated exposure control, iterative reconstruction, and/or weight based adjustment of the mA/kV was utilized to reduce  the radiation dose to as low as reasonably achievable. COMPARISON: 06/14/2024 CLINICAL HISTORY: Neck trauma (age >= 65y) due to fall. Witnessed fall, right shoulder pain, hematoma over right eye. Blood thinners. FINDINGS: CERVICAL SPINE: BONES AND ALIGNMENT: There is mild reversal of the normal cervical spine lordosis. No acute fracture or traumatic malalignment. DEGENERATIVE CHANGES: Normal multilevel endplates are seen throughout all levels of the cervical spine with mild intervertebral disc space narrowing at the level of C5-C6. Mild bilateral multilevel facet joint hypertrophy is noted. SOFT TISSUES: No prevertebral soft tissue swelling. IMPRESSION: 1. No acute abnormality of the cervical spine related to trauma. Electronically signed by: Suzen Dials MD 08/09/2024 05:48 PM EDT RP Workstation: [id number]   CT Head Wo Contrast Result Date: 08/09/2024 EXAM: CT HEAD WITHOUT CONTRAST 08/09/2024 05:14:57 PM TECHNIQUE: CT of the head was performed without  the administration of intravenous contrast. Automated exposure control, iterative reconstruction, and/or weight based adjustment of the mA/kV was utilized to reduce the radiation dose to as low as reasonably achievable. COMPARISON: 06/15/2024 CLINICAL HISTORY: Status post fall. FINDINGS: BRAIN AND VENTRICLES: No acute hemorrhage. No evidence of acute infarct. No hydrocephalus. No extra-axial collection. No mass effect or midline shift. There is generalized cerebral atrophy with nonspecific hypodense foci in the subcortical and periventricular white matter that most likely represent chronic microangiopathic ischemic changes in a patient of this age. ORBITS: No acute abnormality. SINUSES: No acute abnormality. SOFT TISSUES AND SKULL: Mild right frontal scalp soft tissue swelling. No skull fracture. IMPRESSION: 1. No acute intracranial abnormality. 2. Mild right frontal scalp soft tissue swelling. 3. Generalized cerebral atrophy with chronic white matter small vessel ischemic changes. Electronically signed by: Suzen Dials MD 08/09/2024 05:42 PM EDT RP Workstation: [id number]   DG Chest 2 View Result Date: 08/09/2024 CLINICAL DATA:  Fall. EXAM: CHEST - 2 VIEW COMPARISON:  Chest radiograph dated 06/14/2024. FINDINGS: The heart size and mediastinal contours are unchanged. Mitral annular calcification. Similar moderate-to-large hiatal hernia. Patient's chin obscures evaluation of the right-greater-than-left lung apices. No focal consolidation, pleural effusion, or pneumothorax. Diffuse osseous demineralization. No obvious displaced rib fracture. IMPRESSION: 1. No acute findings in the chest. 2.  Similar moderate-to-large hiatal hernia. Electronically Signed   By: Harrietta Sherry M.D.   On: 08/09/2024 17:15   DG Pelvis 1-2 Views Result Date: 08/09/2024 CLINICAL DATA:  Fall. EXAM: PELVIS - 1-2 VIEW COMPARISON:  06/14/2024. FINDINGS: Diffuse osseous demineralization. No acute fracture or dislocation. Femoral heads  are seated within the acetabula. Sacroiliac joints and pubic symphysis appear anatomically aligned. IMPRESSION: No acute osseous abnormality identified. Electronically Signed   By: Harrietta Sherry M.D.   On: 08/09/2024 17:15   DG Shoulder Right Result Date: 08/09/2024 CLINICAL DATA:  Fall. EXAM: RIGHT SHOULDER - 2+ VIEW COMPARISON:  None Available. FINDINGS: Suboptimal positioning due to patient condition. Diffuse osseous demineralization. Cortical irregularity with linear lucency along the superior margin of the distal clavicle is concerning for a mildly displaced fracture. No definite dislocation or additional fracture identified. Right axillary surgical clips. IMPRESSION: Limited exam. Cortical irregularity and linear lucency along the superior margin of the distal clavicle is concerning for a mildly displaced fracture. Consider further evaluation with dedicated clavicular radiographs or CT. Electronically Signed   By: Harrietta Sherry M.D.   On: 08/09/2024 17:11     Procedures   Medications Ordered in the ED  hydrALAZINE  (APRESOLINE ) tablet 50 mg (has no administration in time range)    73 year old here from living facility for witnessed mechanical fall just  PTA.  Hematoma to right head, pain with palpation of right shoulder.  No shortening rotation of legs.  Will plan on imaging, reassess  Imaging personally viewed and interpreted:  CT head, cervical without significant findings X-ray chest pelvis without significant findings X-ray shoulder with possible fracture distal right clavicle recommend CT imaging CT imaging shows subacute distal clavicle fracture  She was recently admitted in August after mechanical fall.  At that time PT note had noted that patient been complaining about some right shoulder pain at that time.  She did not have any imaging done at that time during her hospitalization.  I suspect her subacute clavicle fracture actually from her prior fall.  Given she is having  some pain I placed her in a sling for comfort.  Should be discharged back to her facility.  Was noted to be hypertensive.  She was due hours ago for her home BP meds.  Will give her home medications.  The patient has been appropriately medically screened and/or stabilized in the ED. I have low suspicion for any other emergent medical condition which would require further screening, evaluation or treatment in the ED or require inpatient management.  Patient is hemodynamically stable and in no acute distress.  Patient able to ambulate in department prior to ED.  Evaluation does not show acute pathology that would require ongoing or additional emergent interventions while in the emergency department or further inpatient treatment.  I have discussed the diagnosis with the patient and answered all questions.  Pain is been managed while in the emergency department and patient has no further complaints prior to discharge.  Patient is comfortable with plan discussed in room and is stable for discharge at this time.  I have discussed strict return precautions for returning to the emergency department.  Patient was encouraged to follow-up with PCP/specialist refer to at discharge.     Clinical Course as of 08/09/24 1933  Fri Aug 09, 2024  1616 Witnessed mechanical fall at living facility [employer]  Tripped and fell on her crocs sandals.  No anticoagulation.  Hematoma right forehead, some right shoulder pain with range of motion. [BH]    Clinical Course User Index [BH] Scott Fix A, PA-C                                 Medical Decision Making Amount and/or Complexity of Data Reviewed Independent Historian: EMS External Data Reviewed: labs, radiology and notes. Radiology: ordered and independent interpretation performed. Decision-making details documented in ED Course.  Risk OTC drugs. Prescription drug management. Decision regarding hospitalization. Diagnosis or treatment significantly  limited by social determinants of health.       Final diagnoses:  Fall, initial encounter  Closed nondisplaced fracture of acromial end of right clavicle, initial encounter  Hypertension, unspecified type    ED Discharge Orders     None          Edyn Qazi A, PA-C 08/09/24 1933    Lenor Hollering, MD 08/09/24 2315

## 2024-08-09 NOTE — Discharge Instructions (Signed)
 CT imaging did not show any significant findings however there is a subacute-meaning not new fracture of her right clavicle.  I have placed in a sling for comfort.  Follow-up outpatient, return for new or worsening symptoms

## 2024-08-09 NOTE — ED Triage Notes (Signed)
 Pt BIB [facility] from [place] due to fall. Witnessed fall walking in the dinning area. C/o right shoulder pain. Hematoma noted over right eye.- Blood thinners. GCS 13 at baseline.

## 2024-08-09 NOTE — ED Notes (Addendum)
 D/C instructions gone over with Rashita at [facility]. She was informed that pt will be arriving back by ambulance tonight

## 2024-08-09 NOTE — Progress Notes (Signed)
 Orthopedic Tech Progress Note Patient Details:  Sarah Cook 12-07-1950 [id number]  Ortho Devices Type of Ortho Device: Sling immobilizer Ortho Device/Splint Location: R CLAVICLE Ortho Device/Splint Interventions: Ordered, Application   Post Interventions Patient Tolerated: Well Instructions Provided: Care of device   Mordechai Matuszak L Alic Hilburn 08/09/2024, 7:49 PM

## 2024-08-09 NOTE — ED Notes (Signed)
 Pt taken back to [facility] by PTAR. Pt at baseline at time of d/c. Daria Evangelist, pts legal guardian and spouse, notified of pts departure from [facility]. Rashita from [place] also notified of pts departure with PTAR. Pt denies pain at this time. VSS

## 2024-10-23 ENCOUNTER — Encounter (HOSPITAL_COMMUNITY): Payer: Self-pay

## 2024-10-23 ENCOUNTER — Emergency Department (HOSPITAL_COMMUNITY)

## 2024-10-23 ENCOUNTER — Other Ambulatory Visit: Payer: Self-pay

## 2024-10-23 ENCOUNTER — Emergency Department (HOSPITAL_COMMUNITY)
Admission: EM | Admit: 2024-10-23 | Discharge: 2024-10-23 | Disposition: A | Discharge status: Other Acute Inpatient Hospital | Source: Skilled Nursing Facility | Attending: Emergency Medicine | Admitting: Emergency Medicine

## 2024-10-23 DIAGNOSIS — L03213 Periorbital cellulitis: Secondary | ICD-10-CM | POA: Insufficient documentation

## 2024-10-23 DIAGNOSIS — F0390 Unspecified dementia without behavioral disturbance: Secondary | ICD-10-CM | POA: Insufficient documentation

## 2024-10-23 DIAGNOSIS — R220 Localized swelling, mass and lump, head: Secondary | ICD-10-CM | POA: Diagnosis present

## 2024-10-23 DIAGNOSIS — Z91040 Latex allergy status: Secondary | ICD-10-CM | POA: Diagnosis not present

## 2024-10-23 LAB — BASIC METABOLIC PANEL WITH GFR
Anion gap: 9 (ref 5–15)
BUN: 11 mg/dL (ref 8–23)
CO2: 30 mmol/L (ref 22–32)
Calcium: 10.4 mg/dL — ABNORMAL HIGH (ref 8.9–10.3)
Chloride: 108 mmol/L (ref 98–111)
Creatinine, Ser: 0.81 mg/dL (ref 0.44–1.00)
GFR, Estimated: >60 mL/min (ref >60)
Glucose, Bld: 101 mg/dL — ABNORMAL HIGH (ref 70–99)
Potassium: 2.8 mmol/L — ABNORMAL LOW (ref 3.5–5.1)
Sodium: 147 mmol/L — ABNORMAL HIGH (ref 135–145)

## 2024-10-23 LAB — CBC WITH DIFFERENTIAL/PLATELET
Abs Immature Granulocytes: 0.01 K/uL (ref 0.00–0.07)
Basophils Absolute: 0 K/uL (ref 0.0–0.1)
Basophils Relative: 1 %
Eosinophils Absolute: 0.1 K/uL (ref 0.0–0.5)
Eosinophils Relative: 1 %
HCT: 33.4 % — ABNORMAL LOW (ref 36.0–46.0)
Hemoglobin: 11.6 g/dL — ABNORMAL LOW (ref 12.0–15.0)
Immature Granulocytes: 0 %
Lymphocytes Relative: 16 %
Lymphs Abs: 0.8 K/uL (ref 0.7–4.0)
MCH: 31.1 pg (ref 26.0–34.0)
MCHC: 34.7 g/dL (ref 30.0–36.0)
MCV: 89.5 fL (ref 80.0–100.0)
Monocytes Absolute: 0.4 K/uL (ref 0.1–1.0)
Monocytes Relative: 8 %
Neutro Abs: 4 K/uL (ref 1.7–7.7)
Neutrophils Relative %: 74 %
Platelets: 218 K/uL (ref 150–400)
RBC: 3.73 MIL/uL — ABNORMAL LOW (ref 3.87–5.11)
RDW: 13.3 % (ref 11.5–15.5)
WBC: 5.4 K/uL (ref 4.0–10.5)
nRBC: 0 % (ref 0.0–0.2)

## 2024-10-23 LAB — I-STAT CHEM 8, ED
BUN: 10 mg/dL (ref 8–23)
Calcium, Ion: 1.32 mmol/L (ref 1.15–1.40)
Chloride: 103 mmol/L (ref 98–111)
Creatinine, Ser: 0.9 mg/dL (ref 0.44–1.00)
Glucose, Bld: 97 mg/dL (ref 70–99)
HCT: 31 % — ABNORMAL LOW (ref 36.0–46.0)
Hemoglobin: 10.5 g/dL — ABNORMAL LOW (ref 12.0–15.0)
Potassium: 2.8 mmol/L — ABNORMAL LOW (ref 3.5–5.1)
Sodium: 146 mmol/L — ABNORMAL HIGH (ref 135–145)
TCO2: 30 mmol/L (ref 22–32)

## 2024-10-23 MED ORDER — IOHEXOL 300 MG/ML  SOLN
75.0000 mL | Freq: Once | INTRAMUSCULAR | Status: AC | PRN
  Administered 2024-10-23: 12:00:00 75 mL via INTRAVENOUS

## 2024-10-23 MED ORDER — DEXAMETHASONE SOD PHOSPHATE PF 10 MG/ML IJ SOLN
10.0000 mg | Freq: Once | INTRAMUSCULAR | Status: AC
  Administered 2024-10-23: 10:00:00 10 mg via INTRAVENOUS

## 2024-10-23 MED ORDER — DIPHENHYDRAMINE HCL 50 MG/ML IJ SOLN
25.0000 mg | Freq: Once | INTRAMUSCULAR | Status: AC
  Administered 2024-10-23: 10:00:00 25 mg via INTRAVENOUS
  Filled 2024-10-23: qty 1

## 2024-10-23 MED ORDER — AMOXICILLIN-POT CLAVULANATE 875-125 MG PO TABS: 1.0000 | ORAL_TABLET | Freq: Two times a day (BID) | ORAL | 0 refills | Status: AC

## 2024-10-23 MED ORDER — POTASSIUM CHLORIDE 10 MEQ/100ML IV SOLN
10.0000 meq | Freq: Once | INTRAVENOUS | Status: AC
  Administered 2024-10-23: 11:00:00 10 meq via INTRAVENOUS
  Filled 2024-10-23: qty 100

## 2024-10-23 MED ORDER — POTASSIUM CHLORIDE 20 MEQ PO PACK
60.0000 meq | PACK | Freq: Once | ORAL | Status: AC
  Administered 2024-10-23: 12:00:00 60 meq via ORAL
  Filled 2024-10-23: qty 3

## 2024-10-23 MED ORDER — FAMOTIDINE IN NACL 20-0.9 MG/50ML-% IV SOLN
20.0000 mg | Freq: Once | INTRAVENOUS | Status: AC
  Administered 2024-10-23: 10:00:00 20 mg via INTRAVENOUS
  Filled 2024-10-23: qty 50

## 2024-10-23 MED ORDER — POTASSIUM CHLORIDE CRYS ER 20 MEQ PO TBCR: 60.0000 meq | EXTENDED_RELEASE_TABLET | Freq: Once | ORAL | Status: DC

## 2024-10-23 NOTE — ED Triage Notes (Signed)
 BIB EMS from [facility] for facial swelling around both eyes since yesterday. No injury or changes in medication.  Hx of dementia and EMS reports pt is at baseline. Pt opens eyes to voice, does not answer any questions.

## 2024-10-23 NOTE — ED Notes (Signed)
 PTAR called for ride back to [facility]

## 2024-10-23 NOTE — ED Provider Notes (Signed)
 [place] EMERGENCY DEPARTMENT AT [facility] Provider Note   CSN: [id number] Arrival date & time: 10/23/24  9184     Patient presents with: Facial Swelling   Sarah Cook is a 73 y.o. female.   73 year old female with history dementia presents with facial swelling around both eyes which began today.  According to EMS no recent injury or changes in medication.  According to EMS, the patient is at her baseline.  No treatment given by them during transport       Prior to Admission medications  Medication Sig Start Date End Date Taking? Authorizing Provider  docusate sodium  (COLACE) 100 MG capsule Take 100 mg by mouth every evening.    [provider]  feeding supplement (ENSURE ENLIVE / ENSURE PLUS) LIQD Take 237 mLs by mouth 2 (two) times daily between meals. 10/21/21   Pokhrel, Laxman, MD  hydrALAZINE  (APRESOLINE ) 50 MG tablet Take 50 mg by mouth 2 (two) times daily.    [provider]  irbesartan  (AVAPRO ) 300 MG tablet Take 1 tablet (300 mg total) by mouth daily. 03/30/22   Elgergawy, Brayton RAMAN, MD  lamoTRIgine  (LAMICTAL ) 100 MG tablet Take 1 tablet (100 mg total) by mouth 2 (two) times daily. 06/23/21   Onita Duos, MD  lamoTRIgine  50 MG TBDP Take 1 tablet by mouth at bedtime. 06/10/24   [provider]  LORazepam  (ATIVAN ) 0.5 MG tablet Take 0.25 mg by mouth in the morning and at bedtime. Take 1/2 tablet (0.25mg ) by mouth in the morning and at bedtime for agitation/behavior. 01/31/24   [provider]  LORazepam  (ATIVAN ) 0.5 MG tablet Take 0.5 mg by mouth every 6 (six) hours as needed (agitation/restlessness).    [provider]  Multiple Vitamins-Iron  (MULTIVITAMIN PLUS IRON  ADULT) TABS Take 1 tablet by mouth daily. 06/16/24   Gonfa, Taye T, MD  senna-docusate (SENOKOT-S) 8.6-50 MG tablet Take 1 tablet by mouth 2 (two) times daily between meals as needed for mild constipation. 06/16/24   Gonfa, Taye T, MD    Allergies:  Anesthetics, ester; Codeine; Tape; Hydrochlorothiazide; Hydrocodone; Norvasc  [amlodipine ]; Zyrtec [cetirizine hcl]; and Latex    Review of Systems  Unable to perform ROS: Dementia    Updated Vital Signs BP (!) 153/93 (BP Location: Left Arm)   Pulse 85   Temp 98.5 F (36.9 C) (Oral)   Resp 16   Ht 1.651 m (5' 5)   Wt 62.3 kg   SpO2 96%   BMI 22.86 kg/m   Physical Exam Vitals and nursing note reviewed.  Constitutional:      General: She is not in acute distress.    Appearance: Normal appearance. She is well-developed. She is not toxic-appearing.  HENT:     Head: Normocephalic and atraumatic.  Eyes:     Conjunctiva/sclera: Conjunctivae normal.     Pupils: Pupils are equal, round, and reactive to light.     Comments: Bilateral periorbital edema noted.  No ocular involvement.  Neck:     Thyroid : No thyroid  mass.     Trachea: No tracheal deviation.  Cardiovascular:     Rate and Rhythm: Normal rate and regular rhythm.     Heart sounds: Normal heart sounds. No murmur heard.    No gallop.  Pulmonary:     Effort: Pulmonary effort is normal. No respiratory distress.     Breath sounds: Normal breath sounds. No stridor. No decreased breath sounds, wheezing, rhonchi or rales.  Abdominal:     General: There  is no distension.     Palpations: Abdomen is soft.     Tenderness: There is no abdominal tenderness. There is no rebound.  Musculoskeletal:        General: No tenderness. Normal range of motion.     Cervical back: Normal range of motion and neck supple.  Skin:    General: Skin is warm and dry.     Findings: No abrasion or rash.     Comments: No rashes or hives noted.  Neurological:     Mental Status: She is alert and oriented to person, place, and time. Mental status is at baseline.     GCS: GCS eye subscore is 4. GCS verbal subscore is 5. GCS motor subscore is 6.     Cranial Nerves: No cranial nerve deficit.     Sensory: No sensory deficit.     Motor: Motor function is  intact.  Psychiatric:        Attention and Perception: Attention normal.        Speech: Speech normal.        Behavior: Behavior normal.     (all labs ordered are listed, but only abnormal results are displayed) Labs Reviewed  CBC WITH DIFFERENTIAL/PLATELET  BASIC METABOLIC PANEL WITH GFR  I-STAT CHEM 8, ED    EKG: None  Radiology: No results found.   Procedures   Medications Ordered in the ED  diphenhydrAMINE  (BENADRYL ) injection 25 mg (has no administration in time range)  famotidine  (PEPCID ) IVPB 20 mg premix (has no administration in time range)  dexamethasone  (DECADRON ) injection 10 mg (has no administration in time range)                                    Medical Decision Making Amount and/or Complexity of Data Reviewed Labs: ordered. Radiology: ordered.  Risk Prescription drug management.   Patient treated initially for presumptive allergic reaction.  Her aspirin  did not improve.  Was found to be hypokalemic and treated with potassium here.  Patient had a CT orbit which did not show any evidence of orbital involvement but some preseptal edema.  Will treat patient for presumptive preseptal cellulitis with Augmentin .     Final diagnoses:  None    ED Discharge Orders     None          Dasie Faden, MD 10/23/24 1354
